# Patient Record
Sex: Female | Born: 1955 | Race: White | Hispanic: No | Marital: Married | Smoking: Never smoker
Health system: Southern US, Community
[De-identification: ages and names within clinical notes are randomized; demographics above are authoritative.]

## PROBLEM LIST (undated history)

## (undated) DIAGNOSIS — E785 Hyperlipidemia, unspecified: Secondary | ICD-10-CM

## (undated) DIAGNOSIS — M5090 Cervical disc disorder, unspecified, unspecified cervical region: Secondary | ICD-10-CM

## (undated) DIAGNOSIS — I493 Ventricular premature depolarization: Secondary | ICD-10-CM

## (undated) DIAGNOSIS — H3500 Unspecified background retinopathy: Secondary | ICD-10-CM

## (undated) DIAGNOSIS — M1990 Unspecified osteoarthritis, unspecified site: Secondary | ICD-10-CM

## (undated) DIAGNOSIS — G4733 Obstructive sleep apnea (adult) (pediatric): Secondary | ICD-10-CM

## (undated) DIAGNOSIS — F419 Anxiety disorder, unspecified: Secondary | ICD-10-CM

## (undated) DIAGNOSIS — E038 Other specified hypothyroidism: Secondary | ICD-10-CM

## (undated) DIAGNOSIS — S82899A Other fracture of unspecified lower leg, initial encounter for closed fracture: Secondary | ICD-10-CM

## (undated) DIAGNOSIS — R609 Edema, unspecified: Secondary | ICD-10-CM

## (undated) DIAGNOSIS — E559 Vitamin D deficiency, unspecified: Secondary | ICD-10-CM

## (undated) DIAGNOSIS — E039 Hypothyroidism, unspecified: Secondary | ICD-10-CM

## (undated) DIAGNOSIS — K219 Gastro-esophageal reflux disease without esophagitis: Secondary | ICD-10-CM

## (undated) DIAGNOSIS — I10 Essential (primary) hypertension: Secondary | ICD-10-CM

## (undated) HISTORY — DX: Unspecified background retinopathy: H35.00

## (undated) HISTORY — DX: Edema, unspecified: R60.9

## (undated) HISTORY — PX: ORIF ANKLE FRACTURE: SUR919

## (undated) HISTORY — DX: Ventricular premature depolarization: I49.3

## (undated) HISTORY — DX: Gastro-esophageal reflux disease without esophagitis: K21.9

## (undated) HISTORY — DX: Essential (primary) hypertension: I10

## (undated) HISTORY — DX: Unspecified osteoarthritis, unspecified site: M19.90

## (undated) HISTORY — PX: FOOT SURGERY: SHX648

## (undated) HISTORY — DX: Anxiety disorder, unspecified: F41.9

## (undated) HISTORY — DX: Other specified hypothyroidism: E03.8

## (undated) HISTORY — DX: Other fracture of unspecified lower leg, initial encounter for closed fracture: S82.899A

## (undated) HISTORY — DX: Hyperlipidemia, unspecified: E78.5

## (undated) HISTORY — DX: Cervical disc disorder, unspecified, unspecified cervical region: M50.90

## (undated) HISTORY — PX: ABDOMINAL HYSTERECTOMY: SHX81

## (undated) HISTORY — DX: Vitamin D deficiency, unspecified: E55.9

## (undated) HISTORY — DX: Hypothyroidism, unspecified: E03.9

## (undated) HISTORY — DX: Obstructive sleep apnea (adult) (pediatric): G47.33

---
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
## 1984-12-02 HISTORY — PX: TUBAL LIGATION: SHX77

## 1999-01-12 ENCOUNTER — Ambulatory Visit (HOSPITAL_COMMUNITY): Admission: RE | Admit: 1999-01-12 | Discharge: 1999-01-12 | Payer: Self-pay | Admitting: Family Medicine

## 1999-01-12 ENCOUNTER — Encounter: Payer: Self-pay | Admitting: Family Medicine

## 1999-04-27 ENCOUNTER — Encounter: Payer: Self-pay | Admitting: Neurosurgery

## 1999-04-27 ENCOUNTER — Ambulatory Visit (HOSPITAL_COMMUNITY): Admission: RE | Admit: 1999-04-27 | Discharge: 1999-04-27 | Payer: Self-pay | Admitting: Neurosurgery

## 1999-06-04 ENCOUNTER — Encounter: Payer: Self-pay | Admitting: Family Medicine

## 1999-06-04 ENCOUNTER — Inpatient Hospital Stay (HOSPITAL_COMMUNITY): Admission: EM | Admit: 1999-06-04 | Discharge: 1999-06-09 | Payer: Self-pay | Admitting: Family Medicine

## 1999-06-04 ENCOUNTER — Encounter: Payer: Self-pay | Admitting: Internal Medicine

## 1999-06-06 ENCOUNTER — Encounter: Payer: Self-pay | Admitting: Gastroenterology

## 1999-06-07 ENCOUNTER — Encounter: Payer: Self-pay | Admitting: Family Medicine

## 1999-06-07 ENCOUNTER — Encounter: Payer: Self-pay | Admitting: Internal Medicine

## 1999-06-08 ENCOUNTER — Encounter: Payer: Self-pay | Admitting: Family Medicine

## 1999-06-25 ENCOUNTER — Other Ambulatory Visit: Admission: RE | Admit: 1999-06-25 | Discharge: 1999-06-25 | Payer: Self-pay | Admitting: Obstetrics and Gynecology

## 2000-01-01 ENCOUNTER — Encounter: Payer: Self-pay | Admitting: Gastroenterology

## 2000-01-01 ENCOUNTER — Ambulatory Visit (HOSPITAL_COMMUNITY): Admission: RE | Admit: 2000-01-01 | Discharge: 2000-01-01 | Payer: Self-pay | Admitting: Gastroenterology

## 2001-03-18 ENCOUNTER — Other Ambulatory Visit: Admission: RE | Admit: 2001-03-18 | Discharge: 2001-03-18 | Payer: Self-pay | Admitting: Obstetrics and Gynecology

## 2003-02-21 ENCOUNTER — Other Ambulatory Visit: Admission: RE | Admit: 2003-02-21 | Discharge: 2003-02-21 | Payer: Self-pay | Admitting: *Deleted

## 2005-09-26 ENCOUNTER — Other Ambulatory Visit: Admission: RE | Admit: 2005-09-26 | Discharge: 2005-09-26 | Payer: Self-pay | Admitting: Obstetrics and Gynecology

## 2005-10-03 ENCOUNTER — Emergency Department (HOSPITAL_COMMUNITY): Admission: EM | Admit: 2005-10-03 | Discharge: 2005-10-03 | Payer: Self-pay | Admitting: Emergency Medicine

## 2005-10-03 IMAGING — CR DG ANKLE 2V *L*
2 series · 2 of 2 positions shown · non-contrast
Comparison: None.

CLINICAL DATA: Fall.  
 2-VIEW LEFT ANKLE:

[view not recorded (1 of 2)]
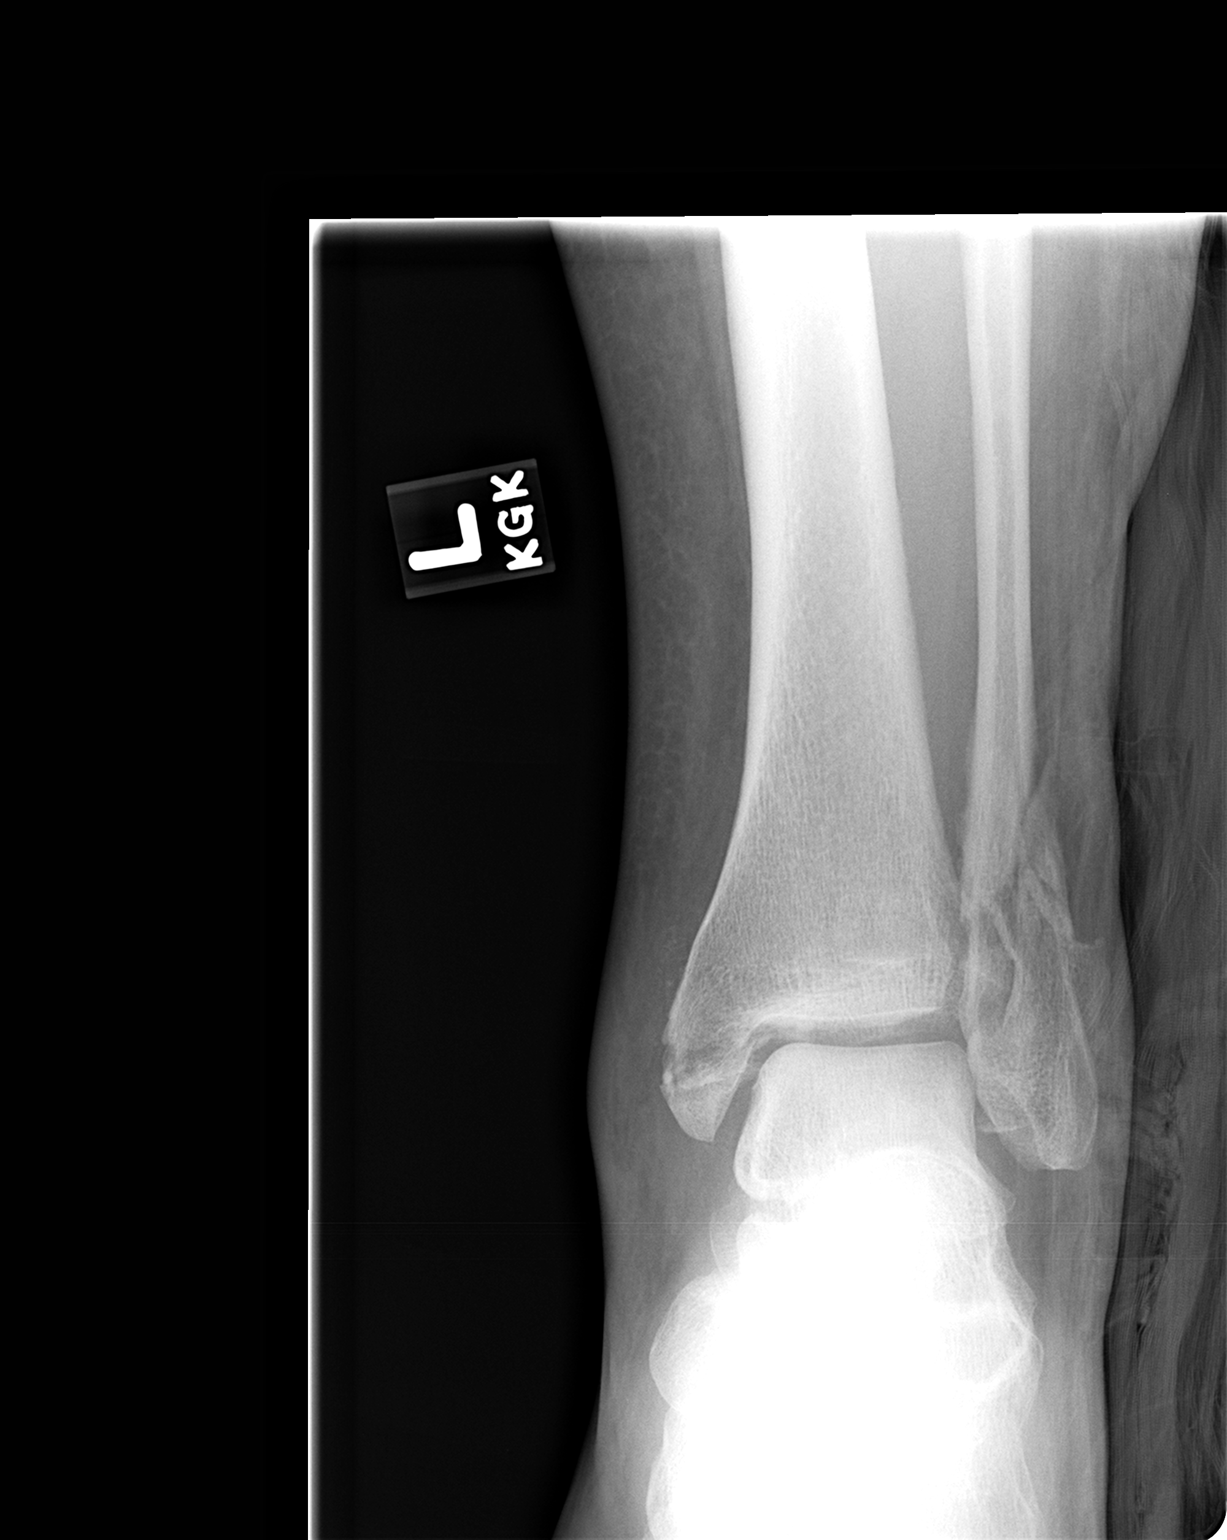

[view not recorded (2 of 2)]
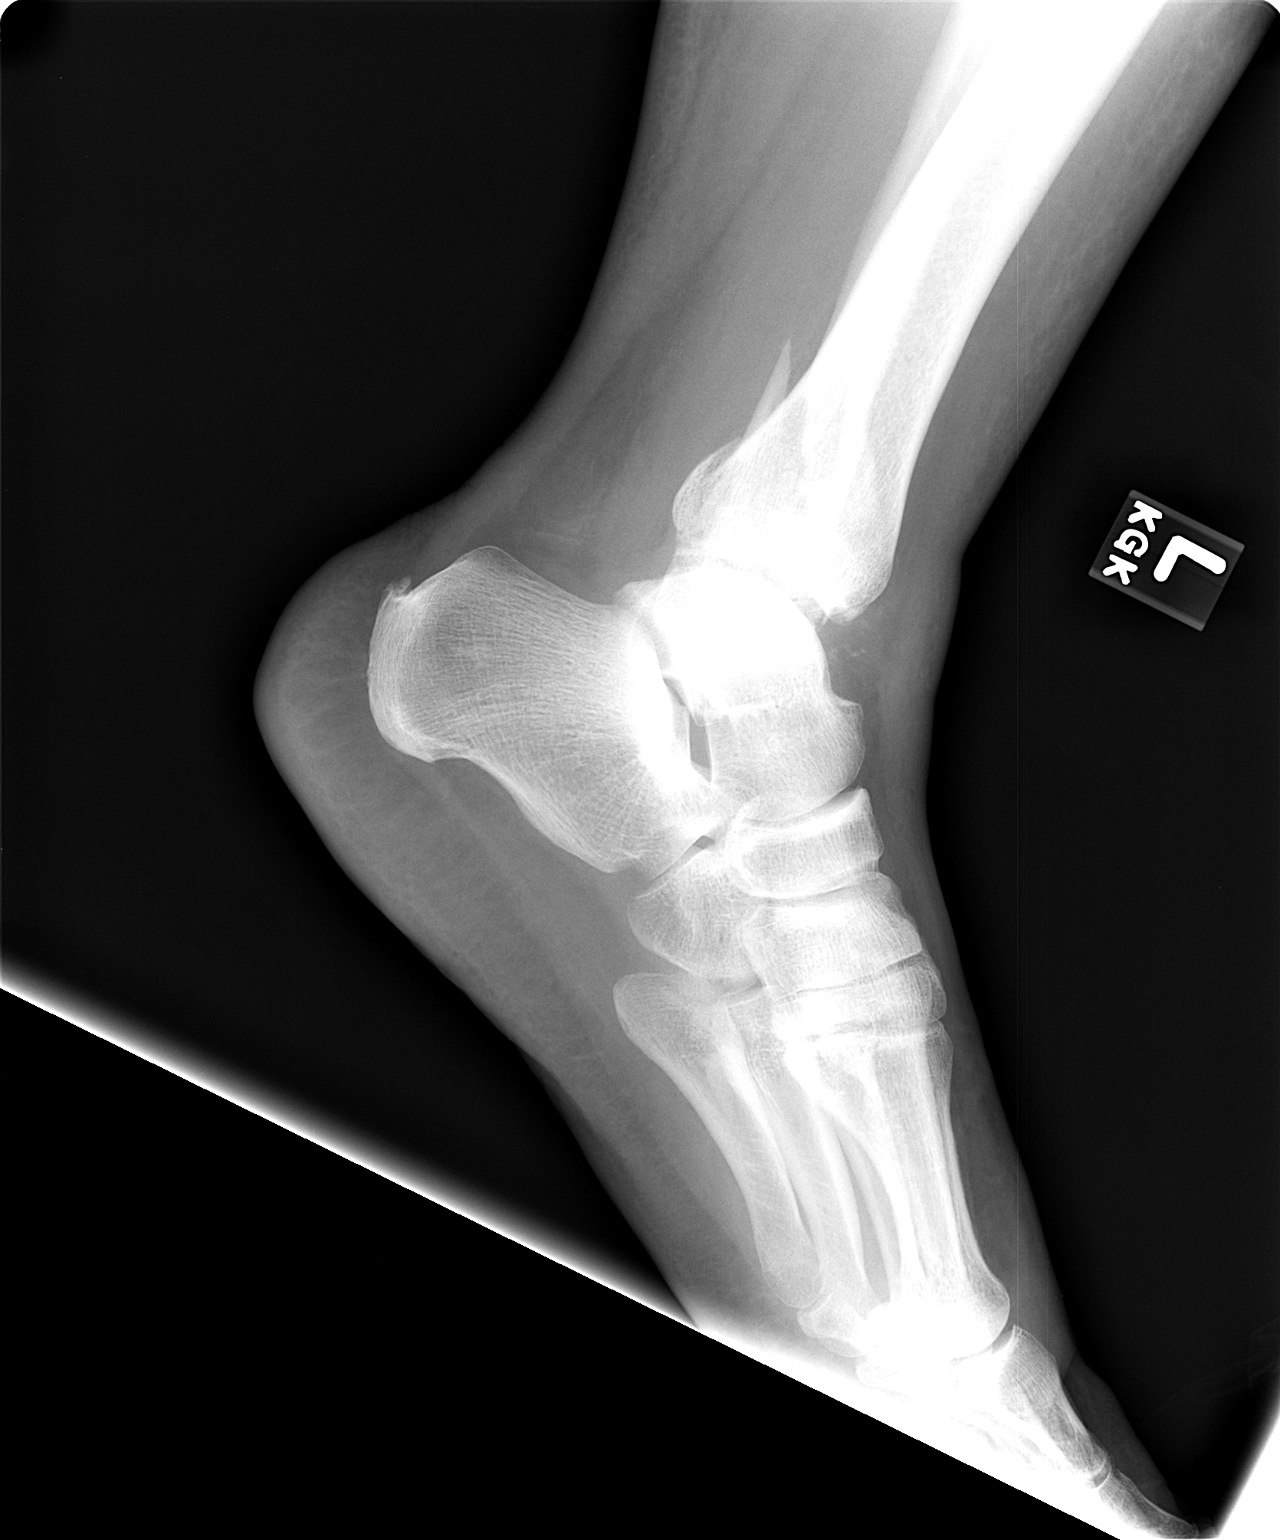

[2 of 2 positions shown; findings below may reference images not displayed]

FINDINGS: There is a comminuted fracture of the distal left fibula, minimally displaced fracture of the medial malleoli and posterior displaced fracture of the posterior malleoli.
IMPRESSION: Findings consistent with a trimalleolar NAMBAR OAN injury.

## 2005-10-03 IMAGING — CR DG CHEST 1V PORT
1 series · 1 of 1 positions shown · non-contrast
Comparison: None.

CLINICAL DATA: Preop for ankle injury.  
 PORTABLE CHEST - 1 VIEW [DATE]:

[view not recorded]
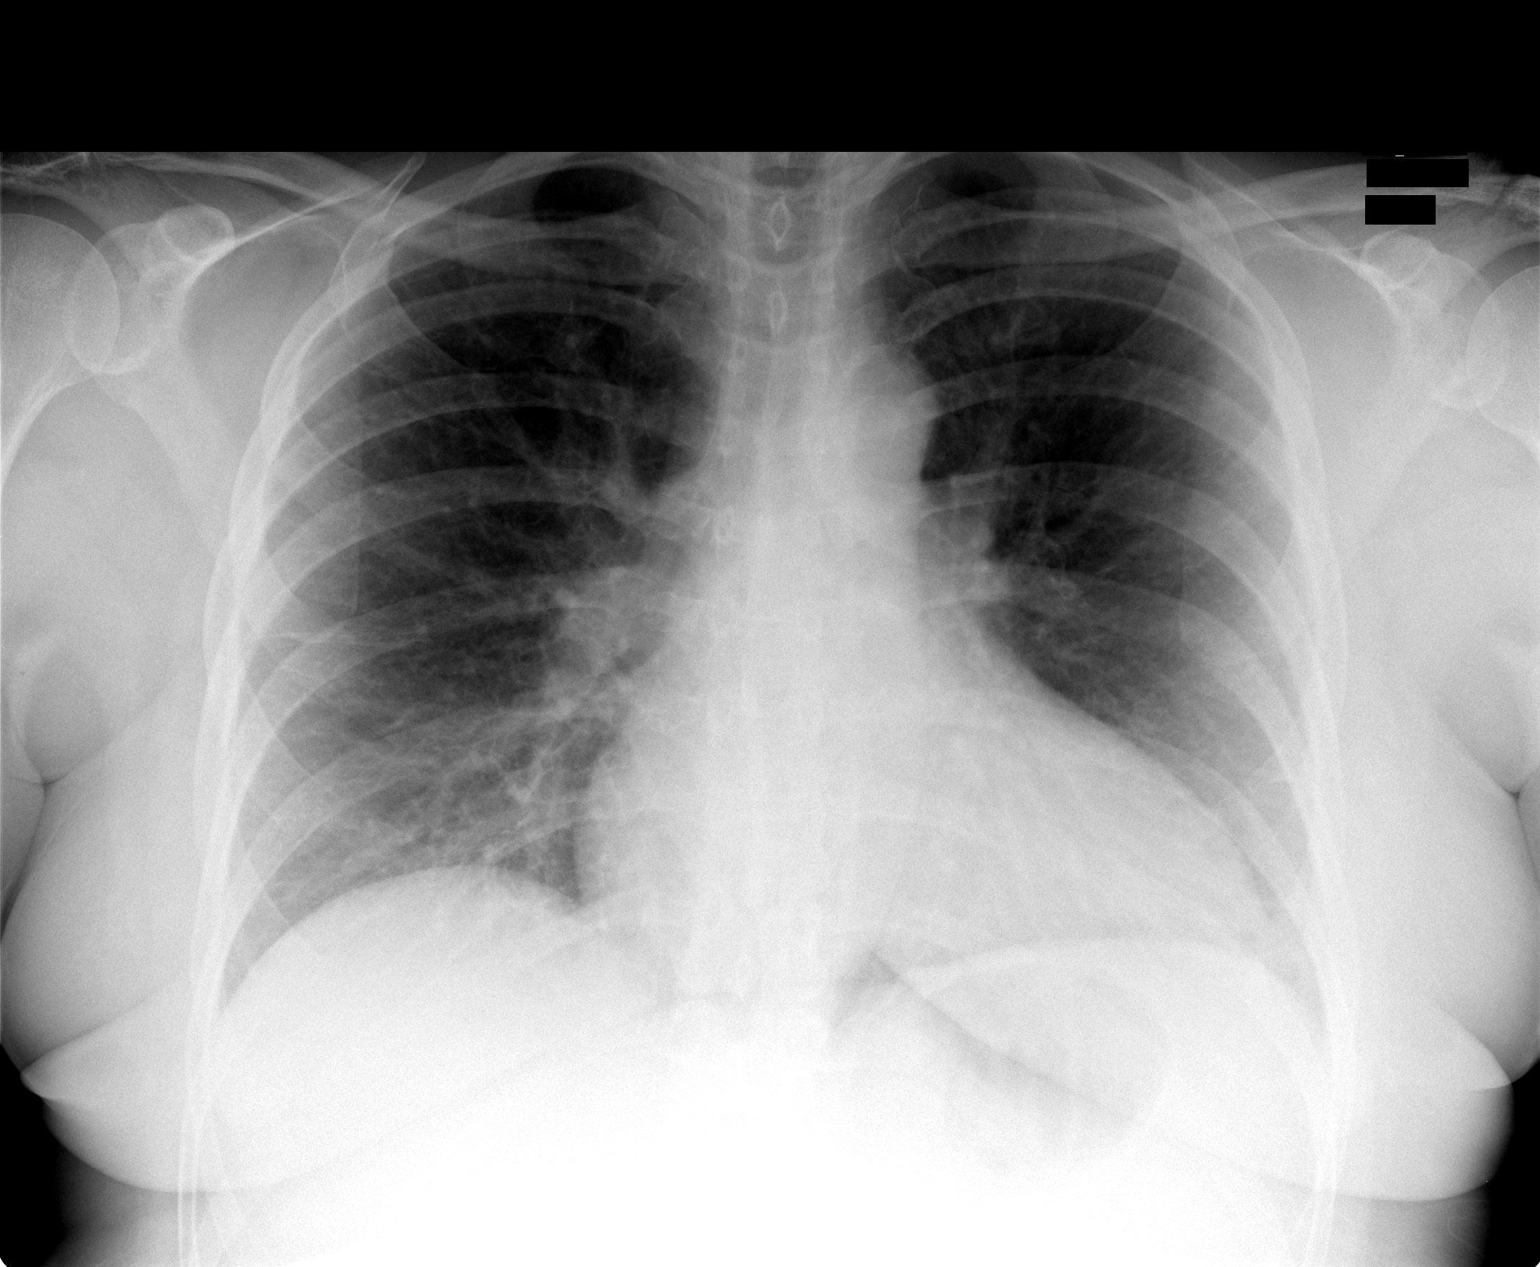

[1 of 1 positions shown; findings below may reference images not displayed]

FINDINGS: There is cardiomegaly.  
 Bibasilar atelectasis is noted.  There are no effusions and no pulmonary edema.
IMPRESSION: Prominent bibasilar atelectasis.

## 2005-10-08 IMAGING — CR DG ANKLE 2V *L*
2 series · 2 of 2 positions shown · non-contrast
Comparison: none

CLINICAL DATA: Post-op left ankle fracture.
 LEFT ANKLE ? 2 VIEWS:

[view not recorded (1 of 2)]
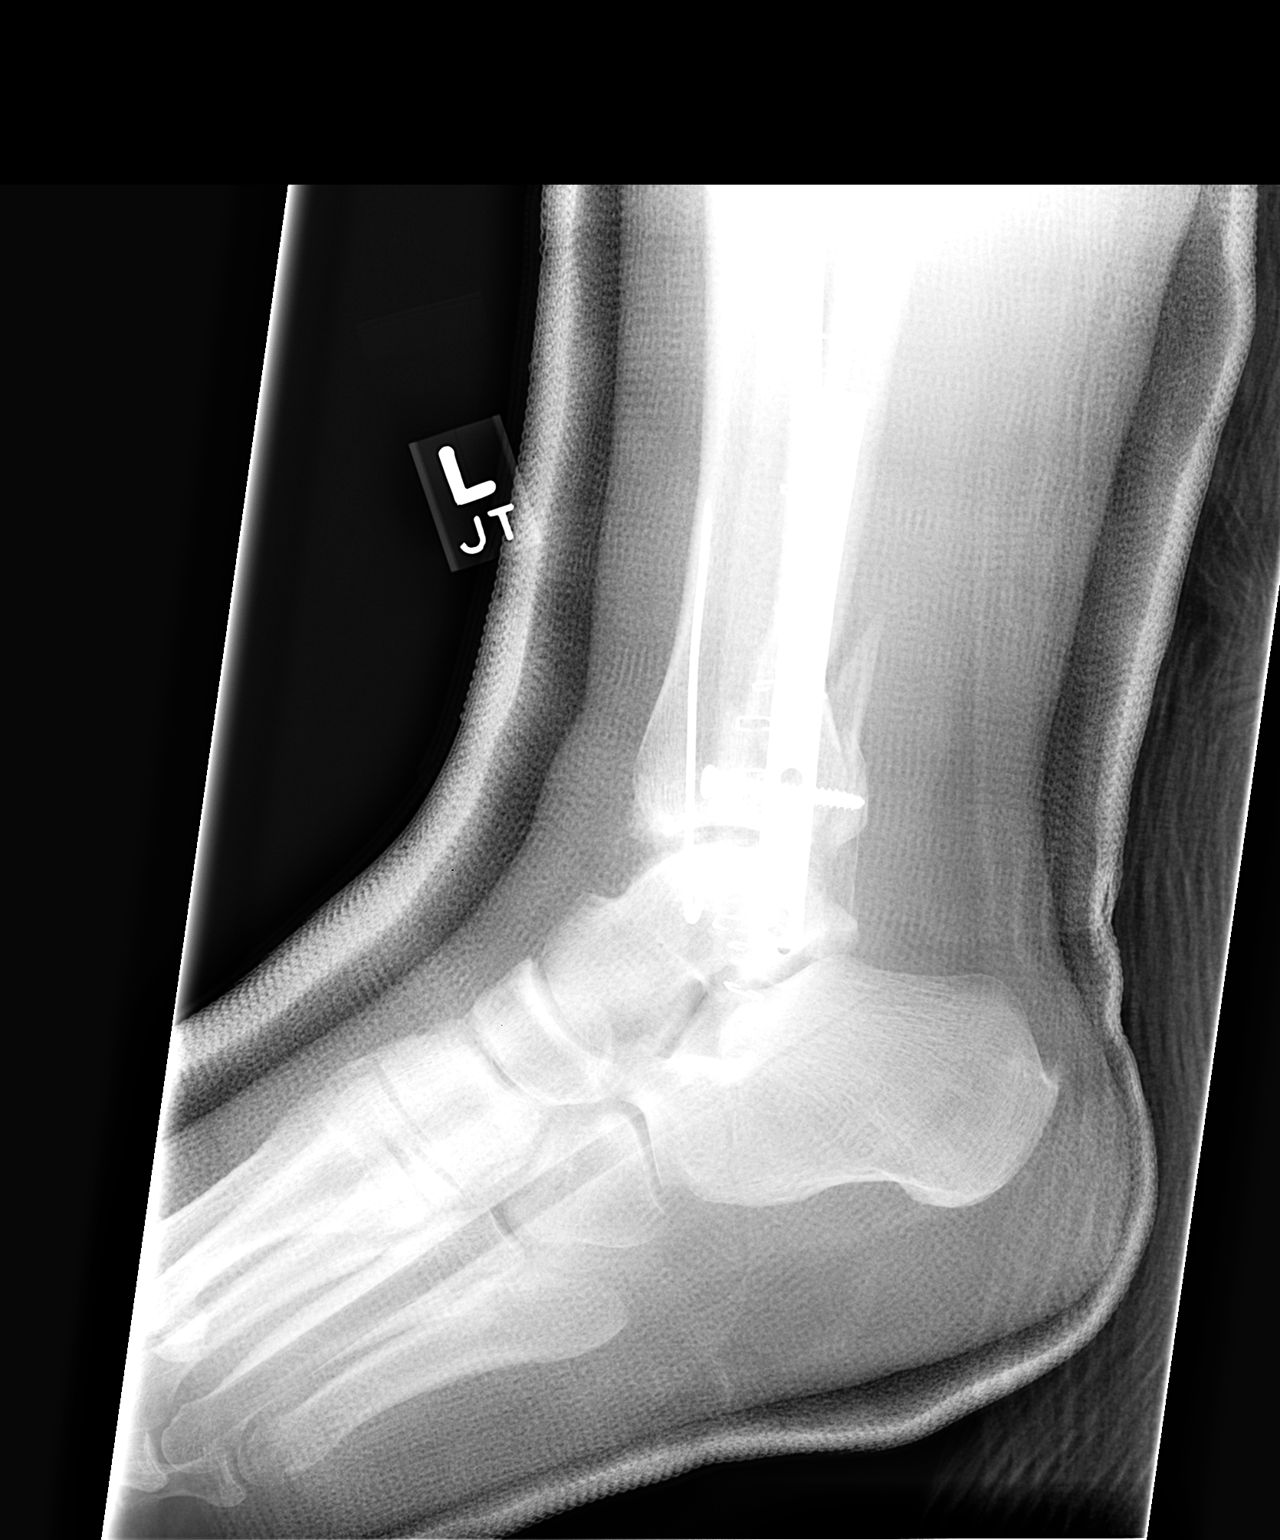

[view not recorded (2 of 2)]
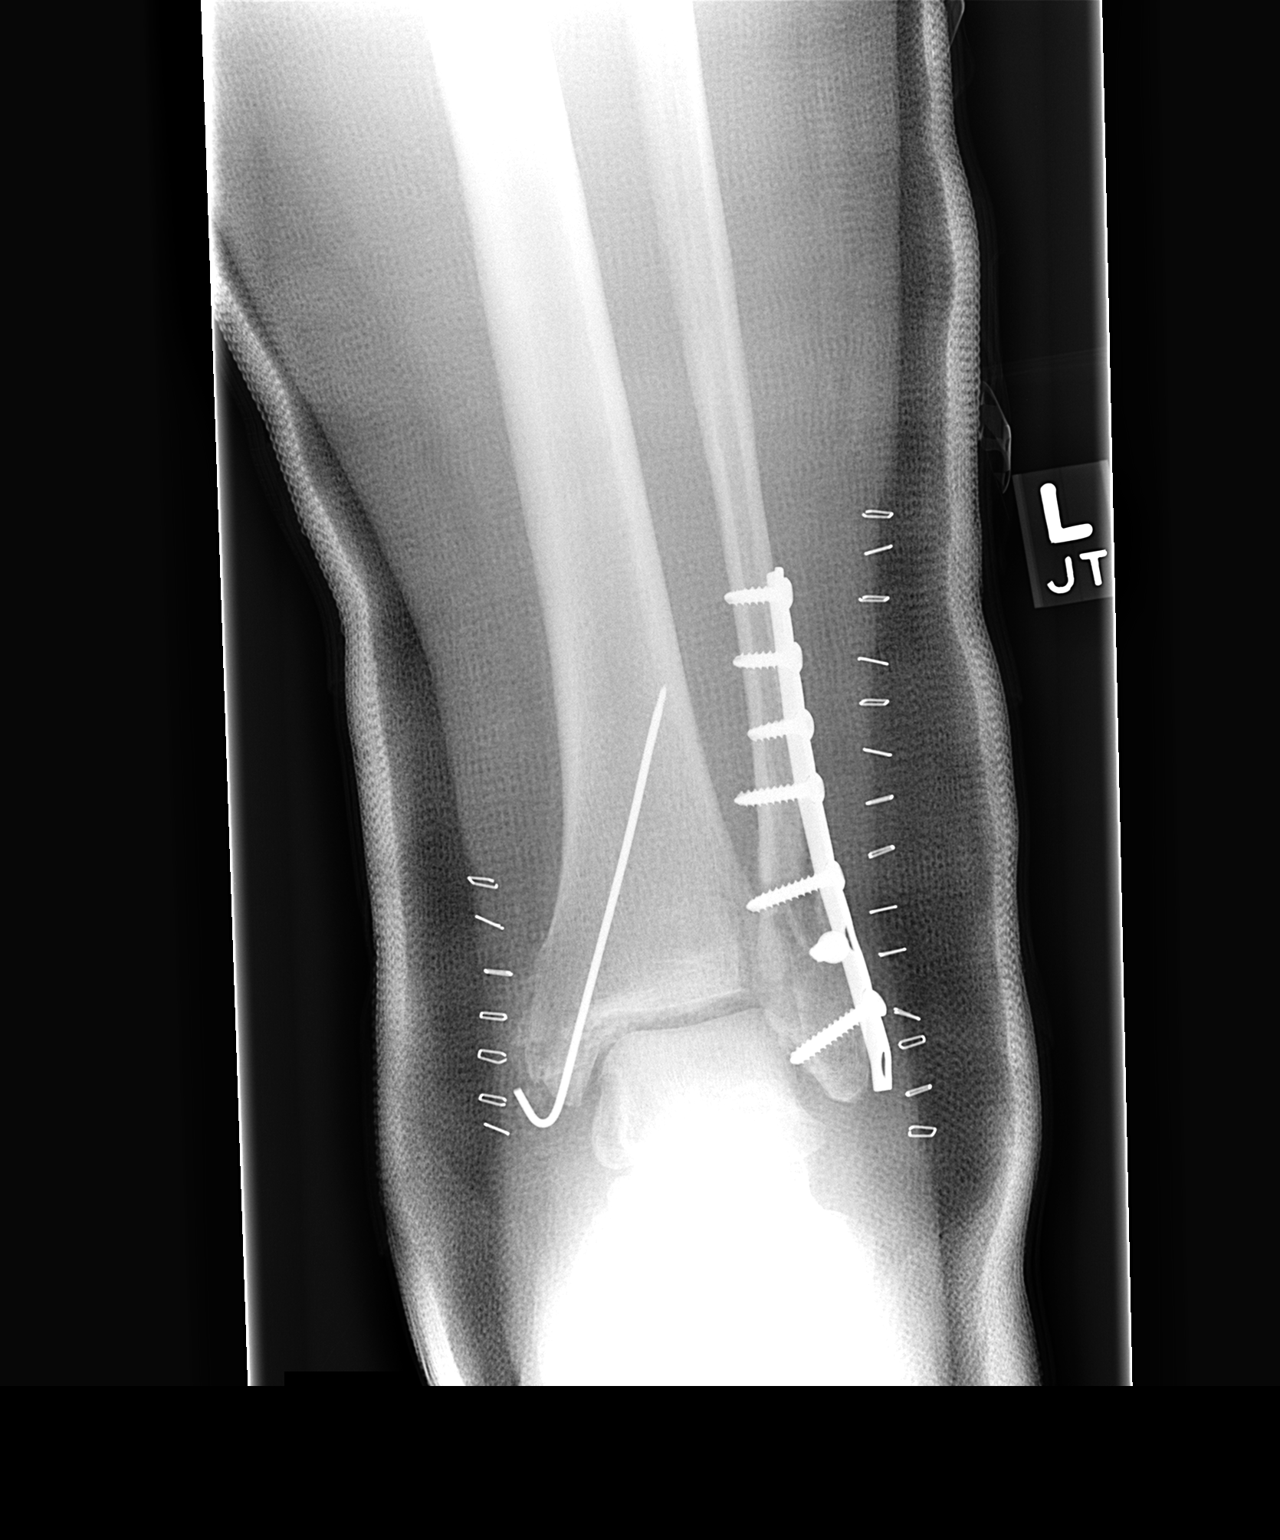

[2 of 2 positions shown; findings below may reference images not displayed]

FINDINGS: Portable AP and lateral views of the left ankle reveal satisfactory position of the plate of the distal fibula and a pin traversing the medial malleolus.
IMPRESSION: Satisfactory open reduction and internal fixation of a trimalleolar fracture.

## 2005-10-09 ENCOUNTER — Ambulatory Visit: Payer: Self-pay | Admitting: Internal Medicine

## 2005-10-09 ENCOUNTER — Encounter: Payer: Self-pay | Admitting: Cardiology

## 2005-10-09 ENCOUNTER — Inpatient Hospital Stay (HOSPITAL_COMMUNITY): Admission: RE | Admit: 2005-10-09 | Discharge: 2005-10-11 | Payer: Self-pay | Admitting: Orthopedic Surgery

## 2005-10-09 ENCOUNTER — Ambulatory Visit: Payer: Self-pay | Admitting: Cardiology

## 2005-10-09 IMAGING — CR DG CHEST 2V
2 series · 2 of 2 positions shown · non-contrast
Comparison: [DATE].

CLINICAL DATA: Diabetes, fever. 
 CHEST - 2 VIEW:

[w chest lat]
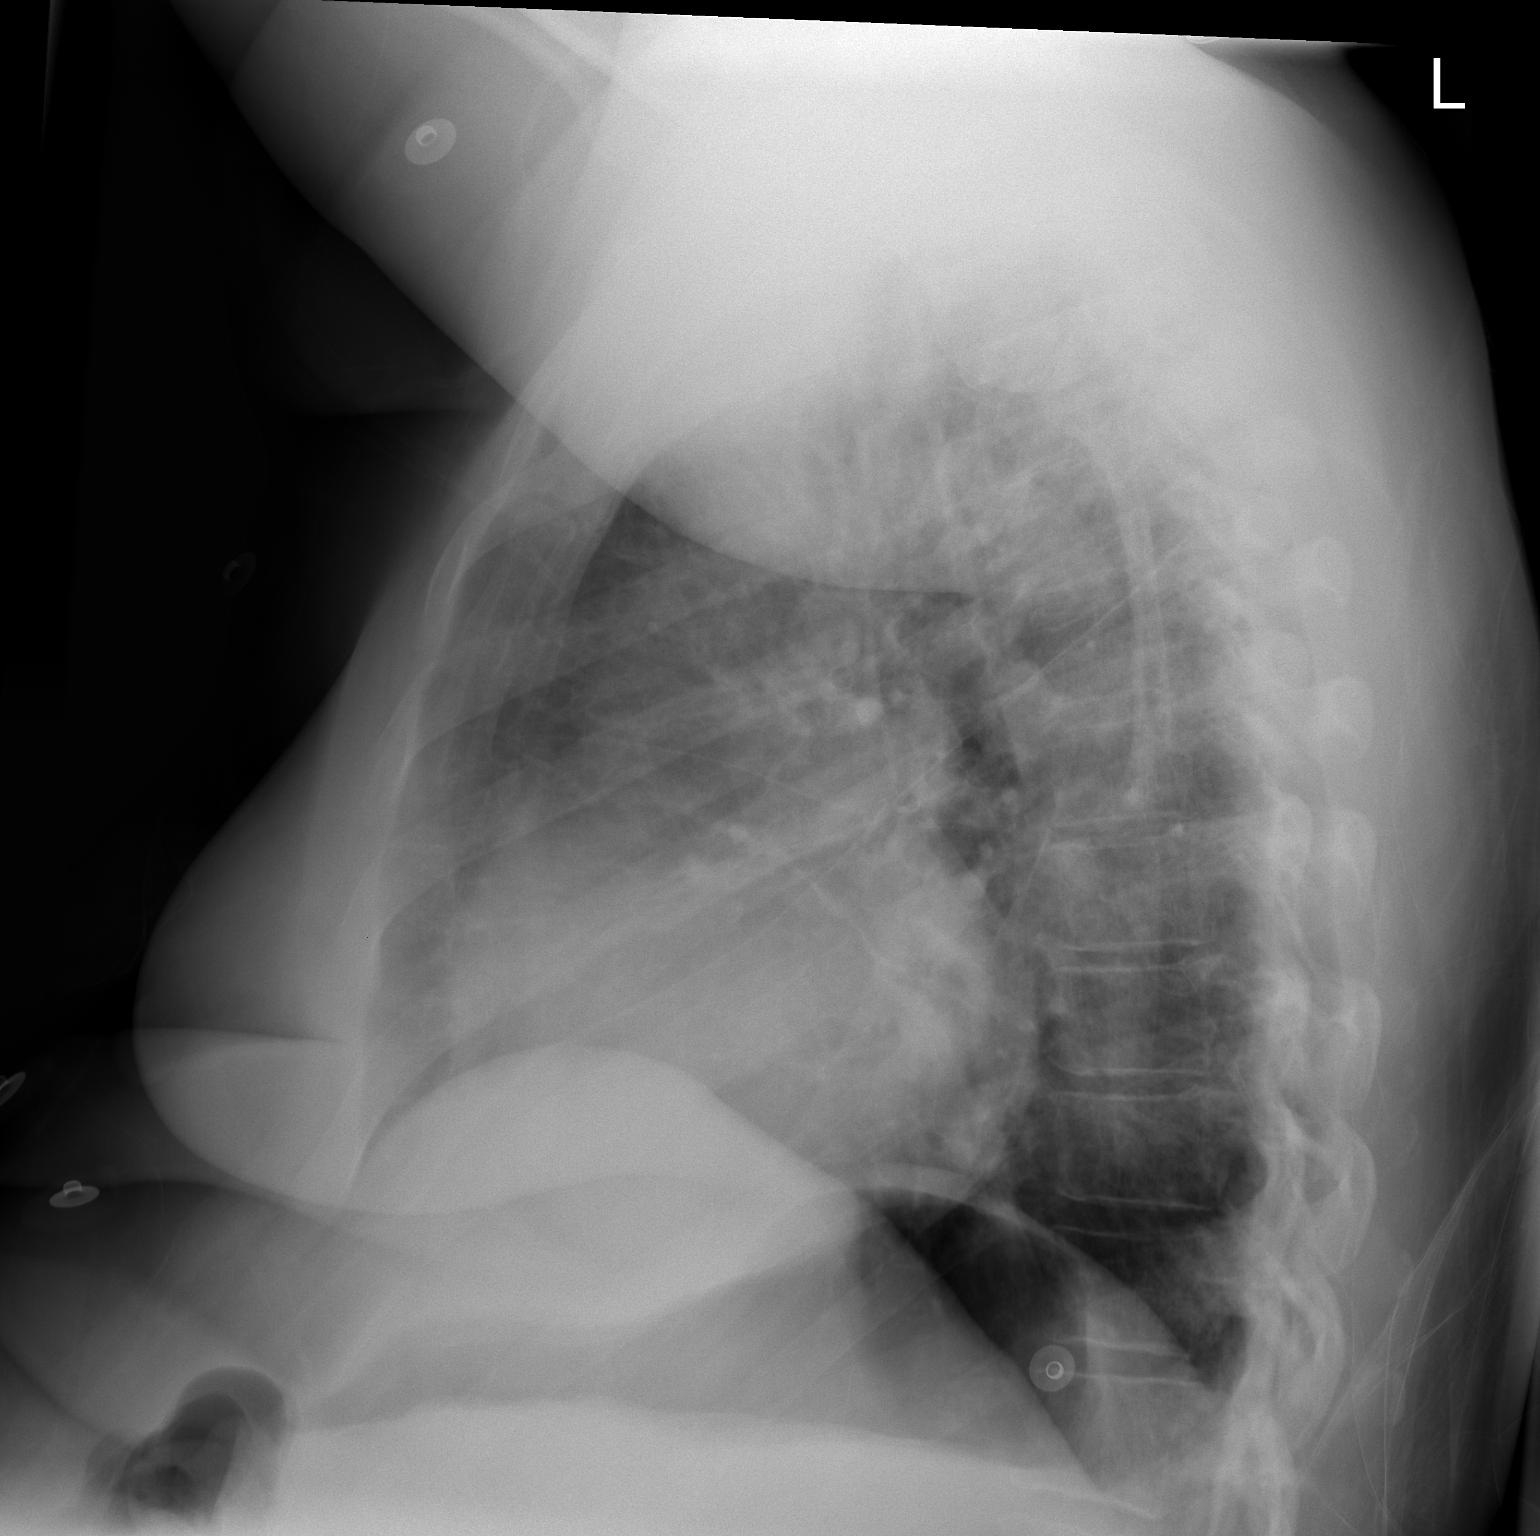

[view not recorded]
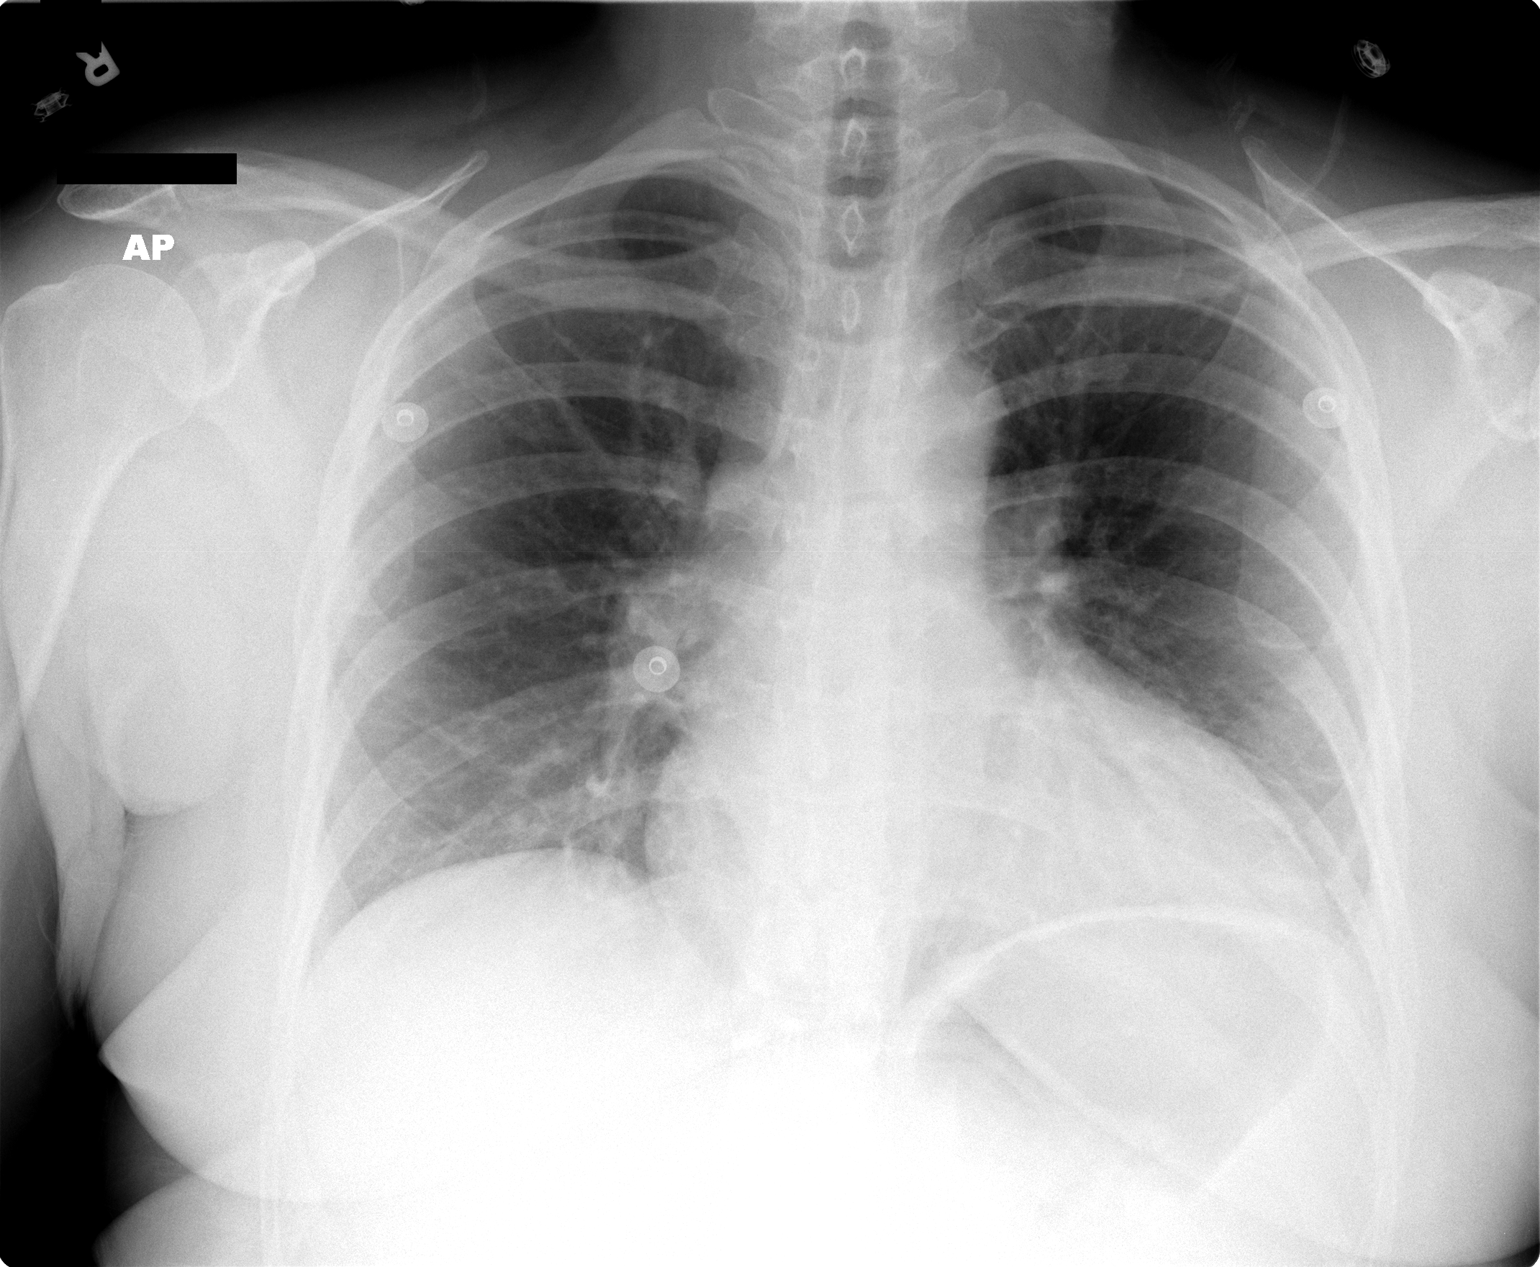

[2 of 2 positions shown; findings below may reference images not displayed]

The heart is mildly enlarged. There is mild bibasilar atelectasis.  No areas of consolidation, lobar collapse or pleural fluid.  Upper lobes are clear.
IMPRESSION: Cardiomegaly and bibasilar atelectasis.  No significant change.

## 2005-12-02 HISTORY — PX: OTHER SURGICAL HISTORY: SHX169

## 2005-12-05 ENCOUNTER — Ambulatory Visit: Payer: Self-pay | Admitting: Internal Medicine

## 2006-03-05 ENCOUNTER — Encounter: Admission: RE | Admit: 2006-03-05 | Discharge: 2006-03-05 | Payer: Self-pay | Admitting: Family Medicine

## 2006-03-05 IMAGING — CR DG CHEST 2V
2 series · 2 of 2 positions shown · non-contrast
Comparison: [DATE]

CLINICAL DATA: Cough, congestion, fever

CHEST - 2 VIEW:

[w chest pa]
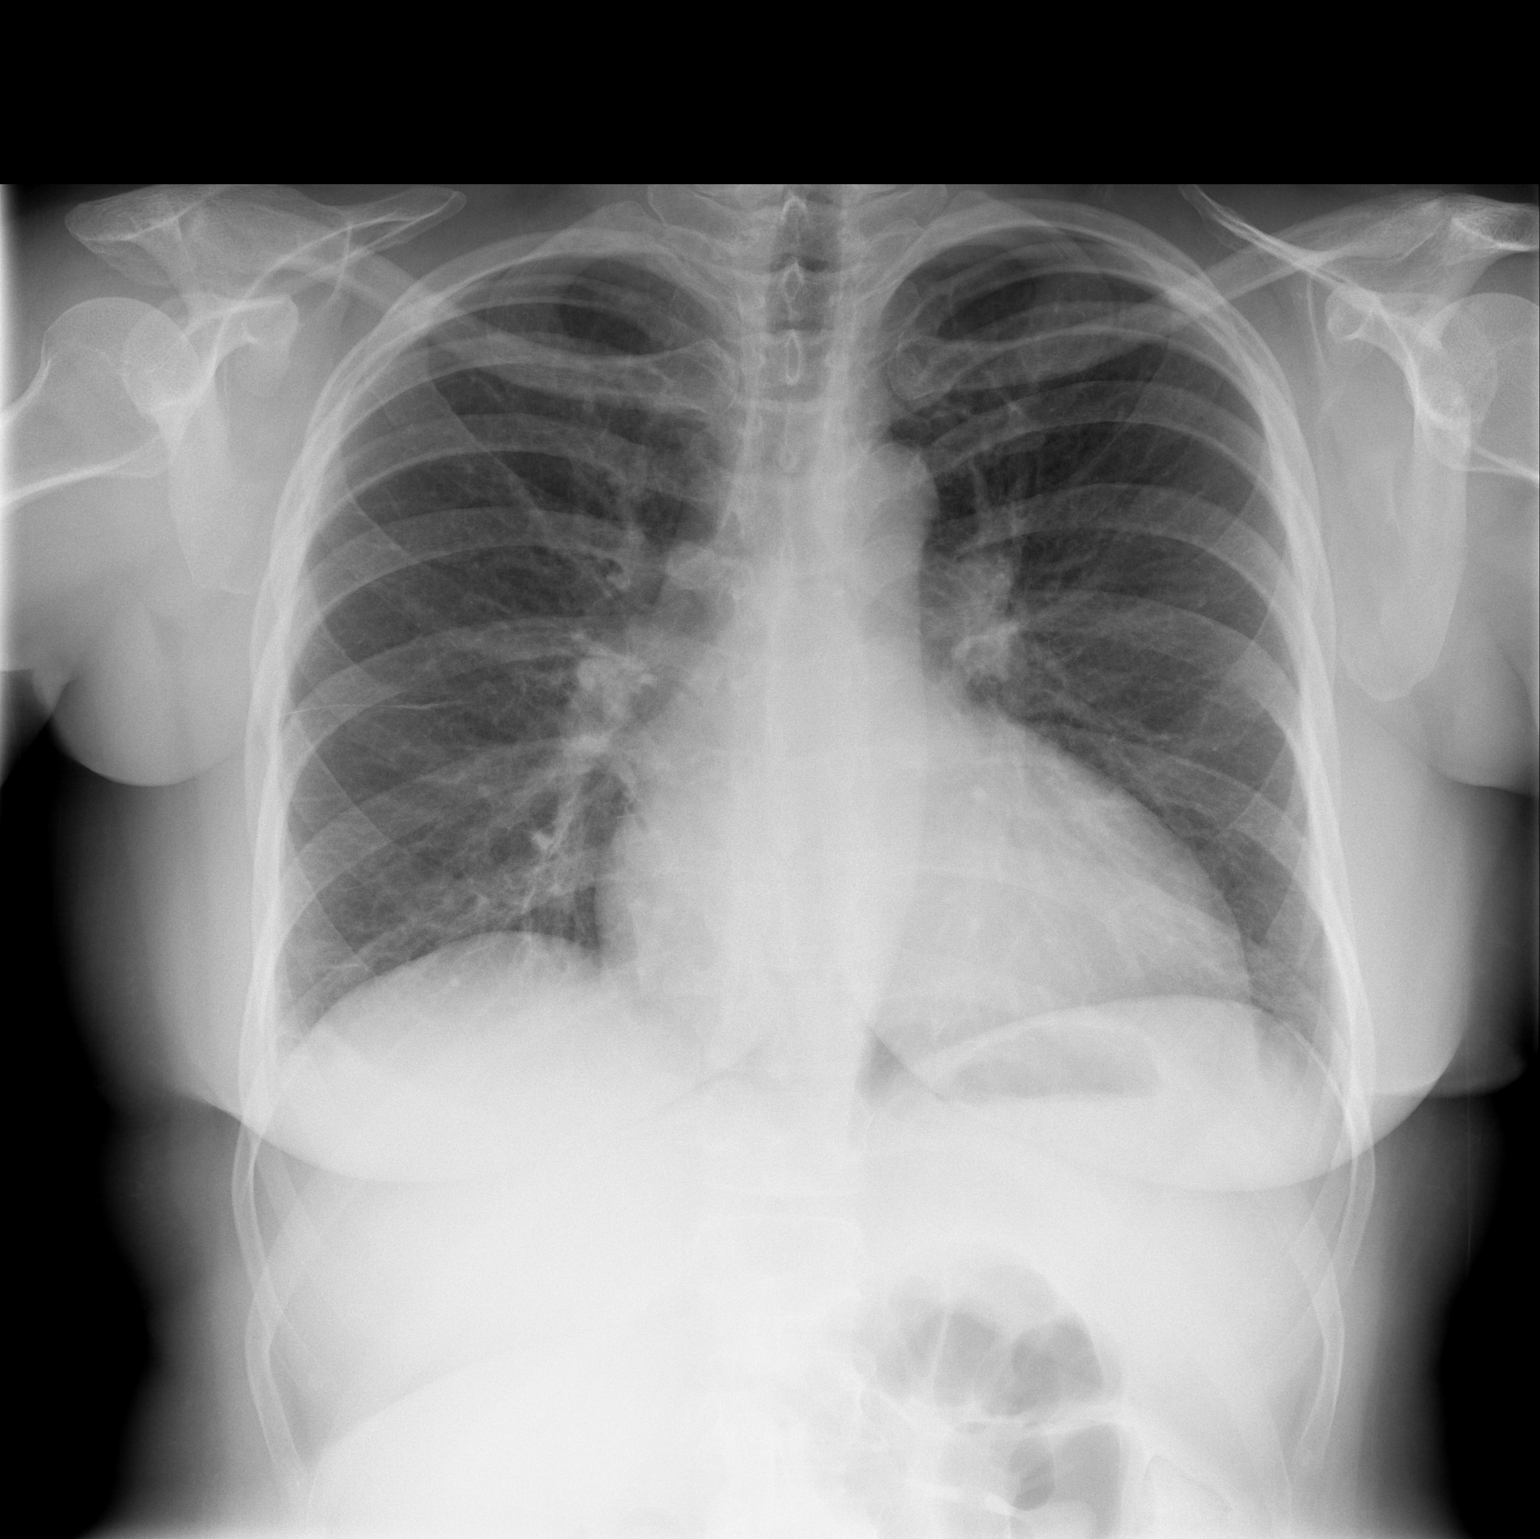

[w chest lat]
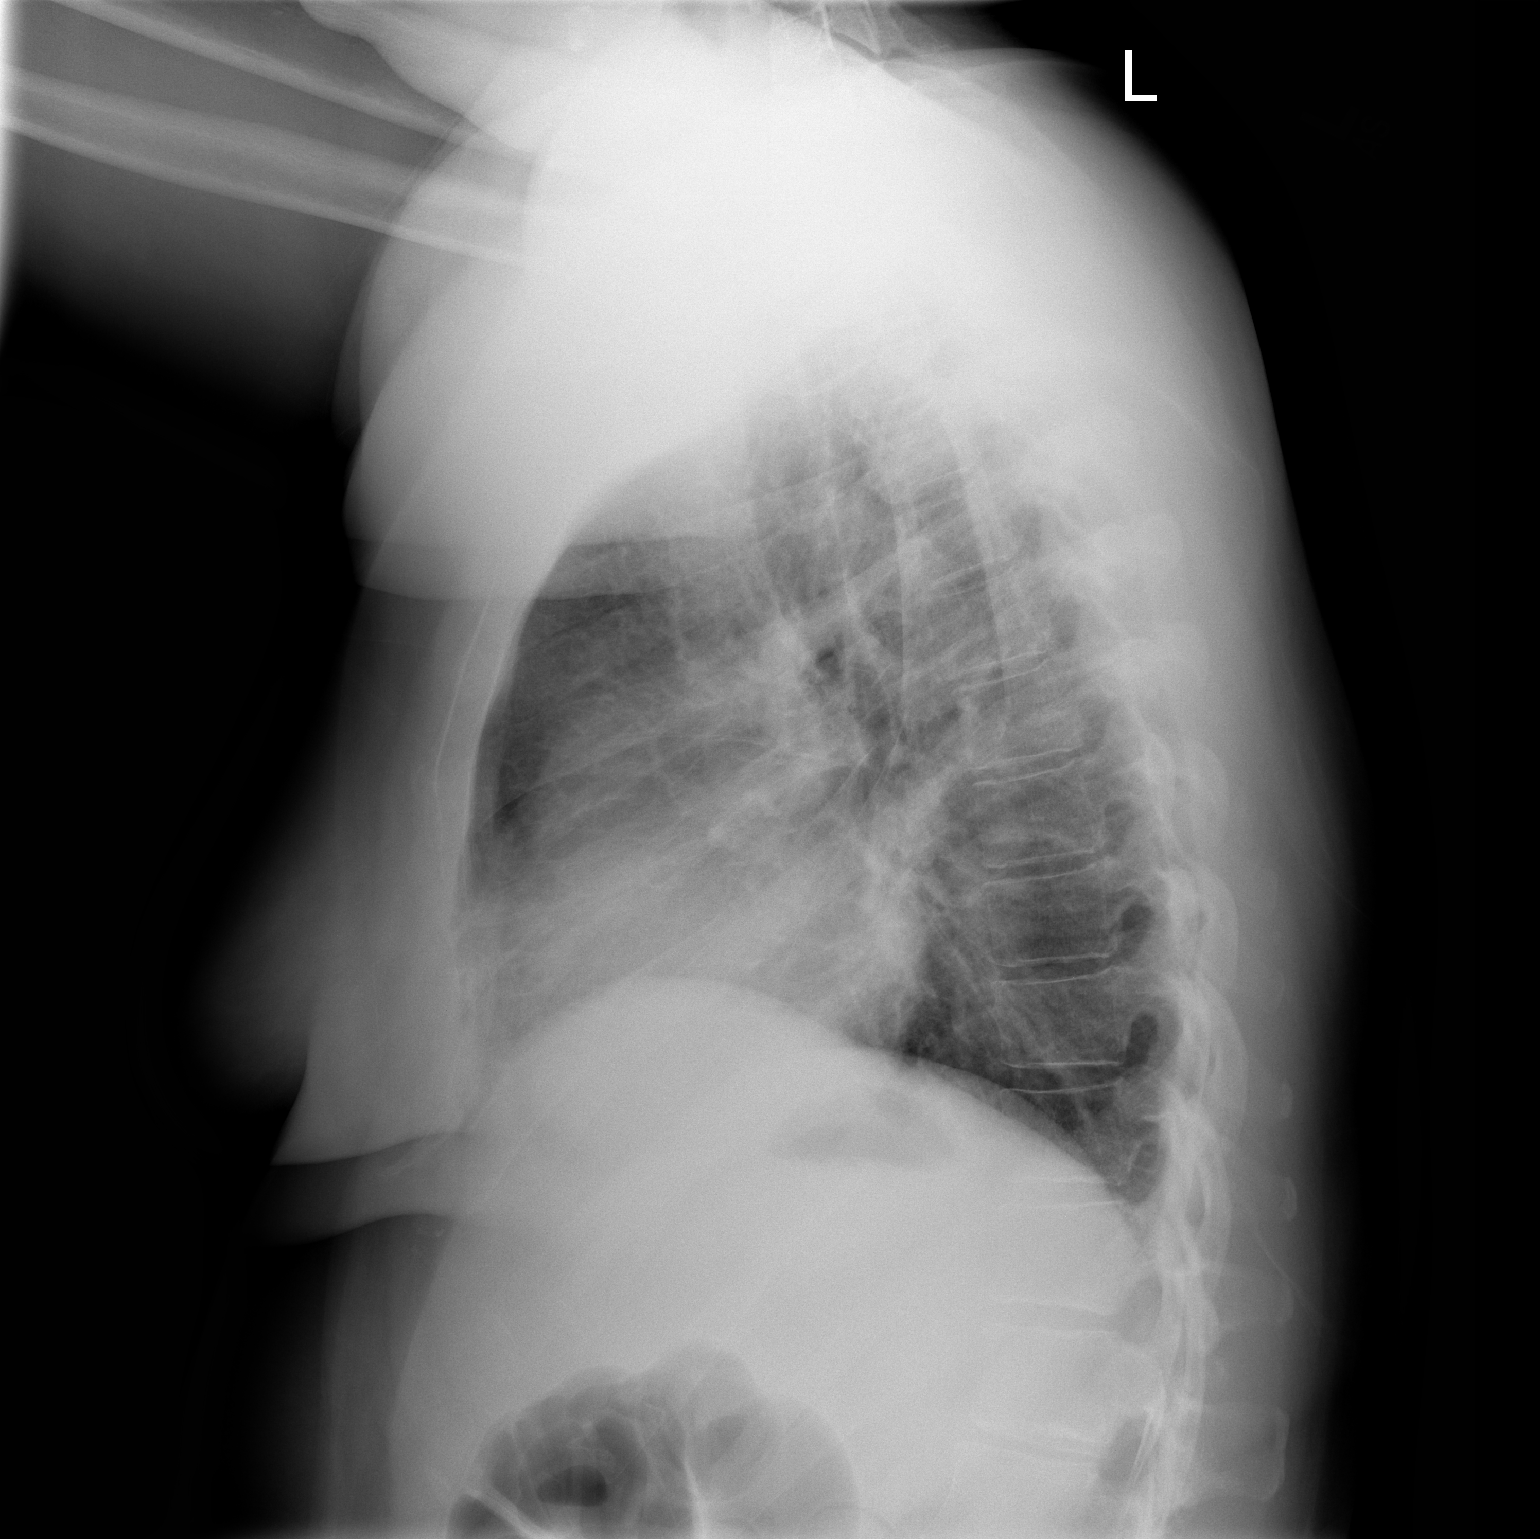

[2 of 2 positions shown; findings below may reference images not displayed]

FINDINGS: Stable cardiomegaly. There is mild peribronchial thickening and
bibasilar atelectasis. No focal airspace opacities or effusions. No change since
prior study.
IMPRESSION: Stable cardiomegaly, bronchitic changes, and bibasilar atelectasis.

## 2006-03-26 ENCOUNTER — Ambulatory Visit: Payer: Self-pay | Admitting: Gastroenterology

## 2006-04-11 ENCOUNTER — Ambulatory Visit: Payer: Self-pay | Admitting: Gastroenterology

## 2006-05-06 ENCOUNTER — Ambulatory Visit: Payer: Self-pay | Admitting: Gastroenterology

## 2006-05-11 ENCOUNTER — Ambulatory Visit: Payer: Self-pay | Admitting: Internal Medicine

## 2006-05-11 ENCOUNTER — Inpatient Hospital Stay (HOSPITAL_COMMUNITY): Admission: EM | Admit: 2006-05-11 | Discharge: 2006-05-17 | Payer: Self-pay | Admitting: Emergency Medicine

## 2006-05-11 IMAGING — CR DG CHEST 2V
2 series · 2 of 2 positions shown · non-contrast
Comparison: none

CLINICAL DATA: Finger injury.  Preoperative evaluation. 
 CHEST ? 2 VIEW:

[view not recorded (1 of 2)]
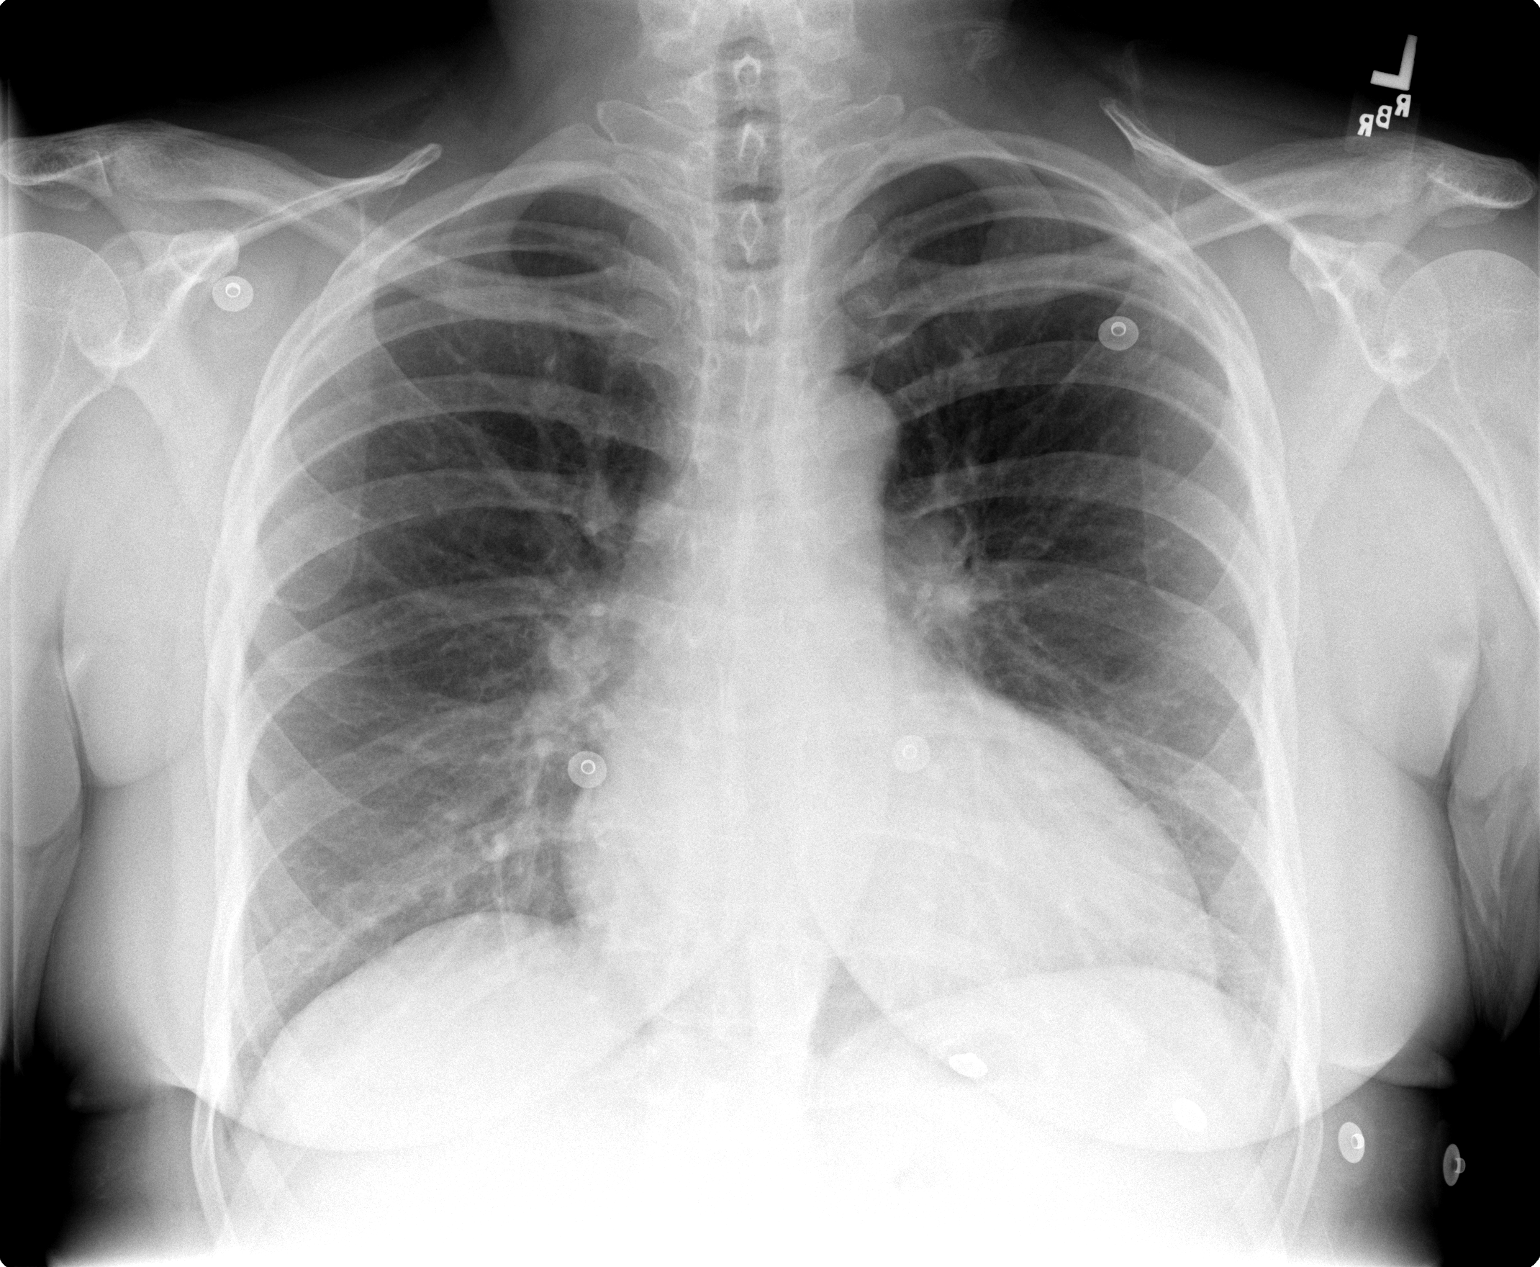

[view not recorded (2 of 2)]
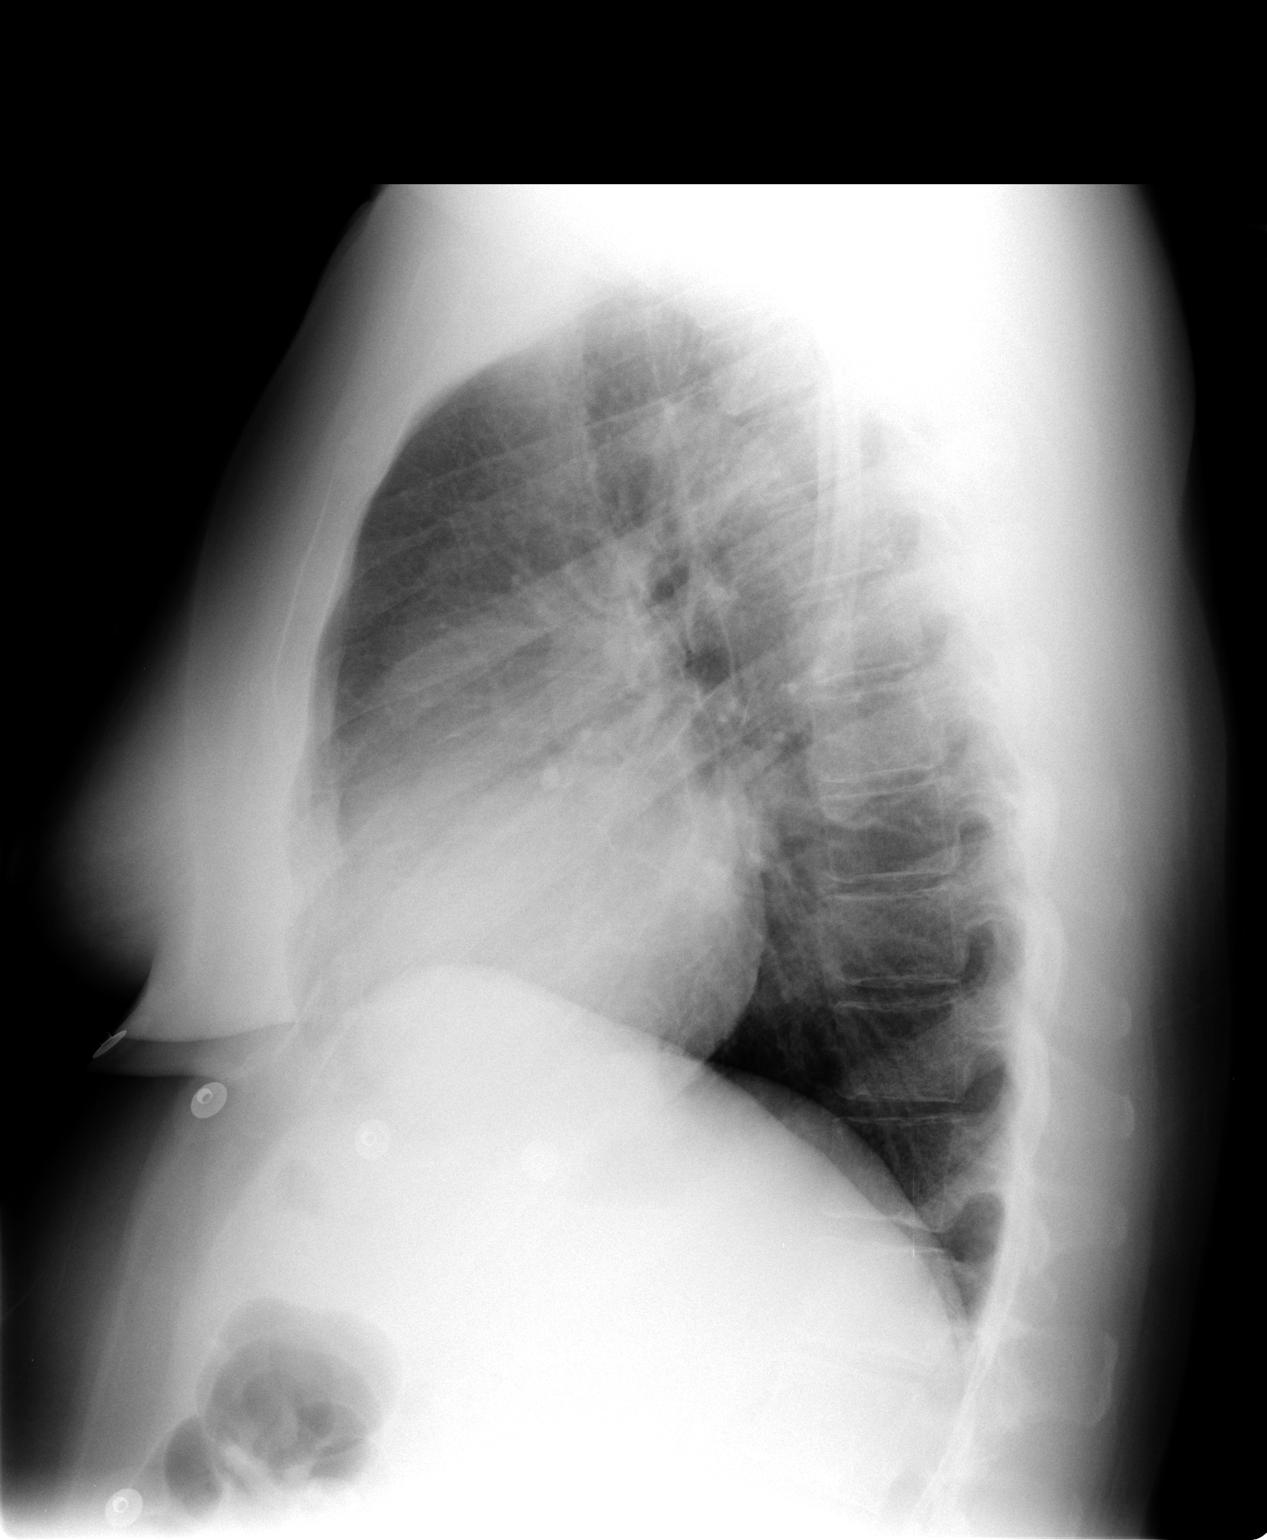

[2 of 2 positions shown; findings below may reference images not displayed]

FINDINGS: The heart is again seen to be enlarged.   The vascularity is normal.   The lungs are clear.   No effusions.   No acute soft tissue or bony findings.
IMPRESSION: Cardiomegaly.   No active disease.
 RIGHT INDEX FINGER ? 3 VIEW:
FINDINGS: The soft tissues are swollen, but there is no evidence of fracture or radiopaque foreign object.   No sign of arthropathy.
IMPRESSION: Soft tissue swelling.

## 2006-05-11 IMAGING — CR DG FINGER INDEX 2+V*R*
3 series · 3 of 3 positions shown · non-contrast
Comparison: none

CLINICAL DATA: Finger injury.  Preoperative evaluation. 
 CHEST ? 2 VIEW:

[view not recorded (1 of 3)]
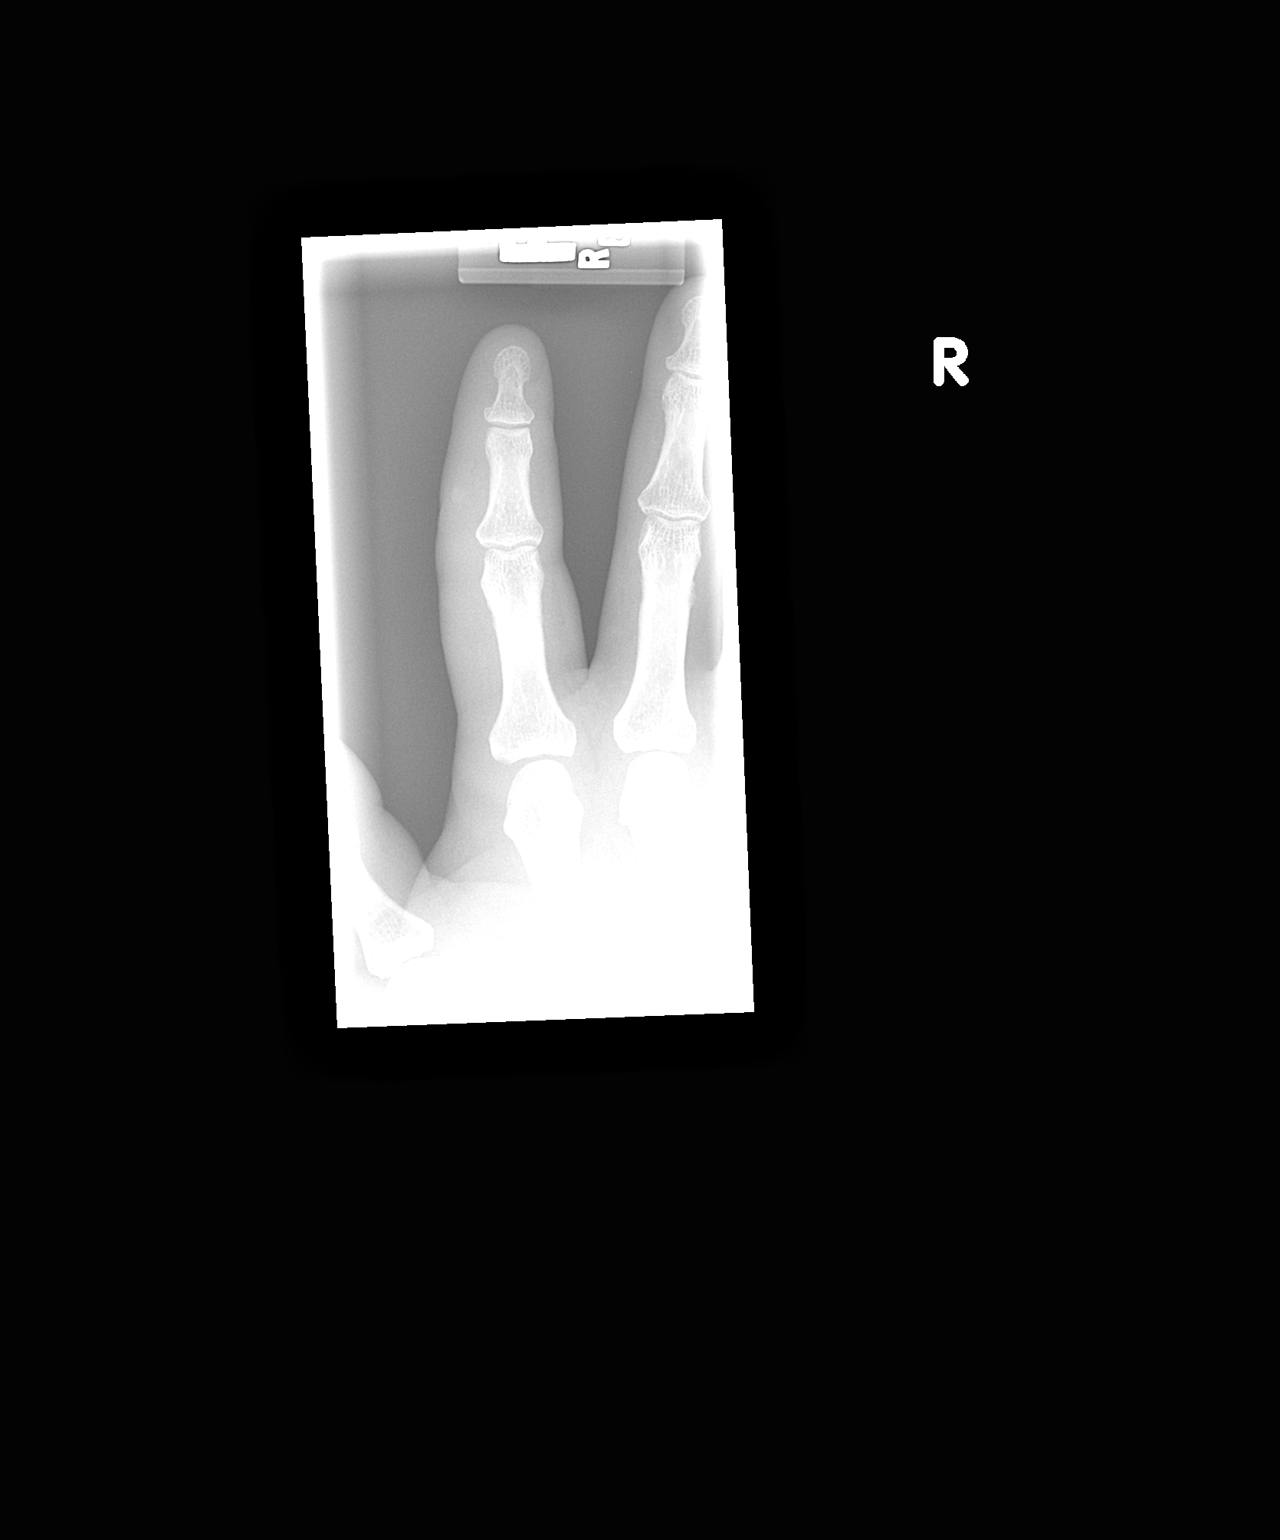

[view not recorded (2 of 3)]
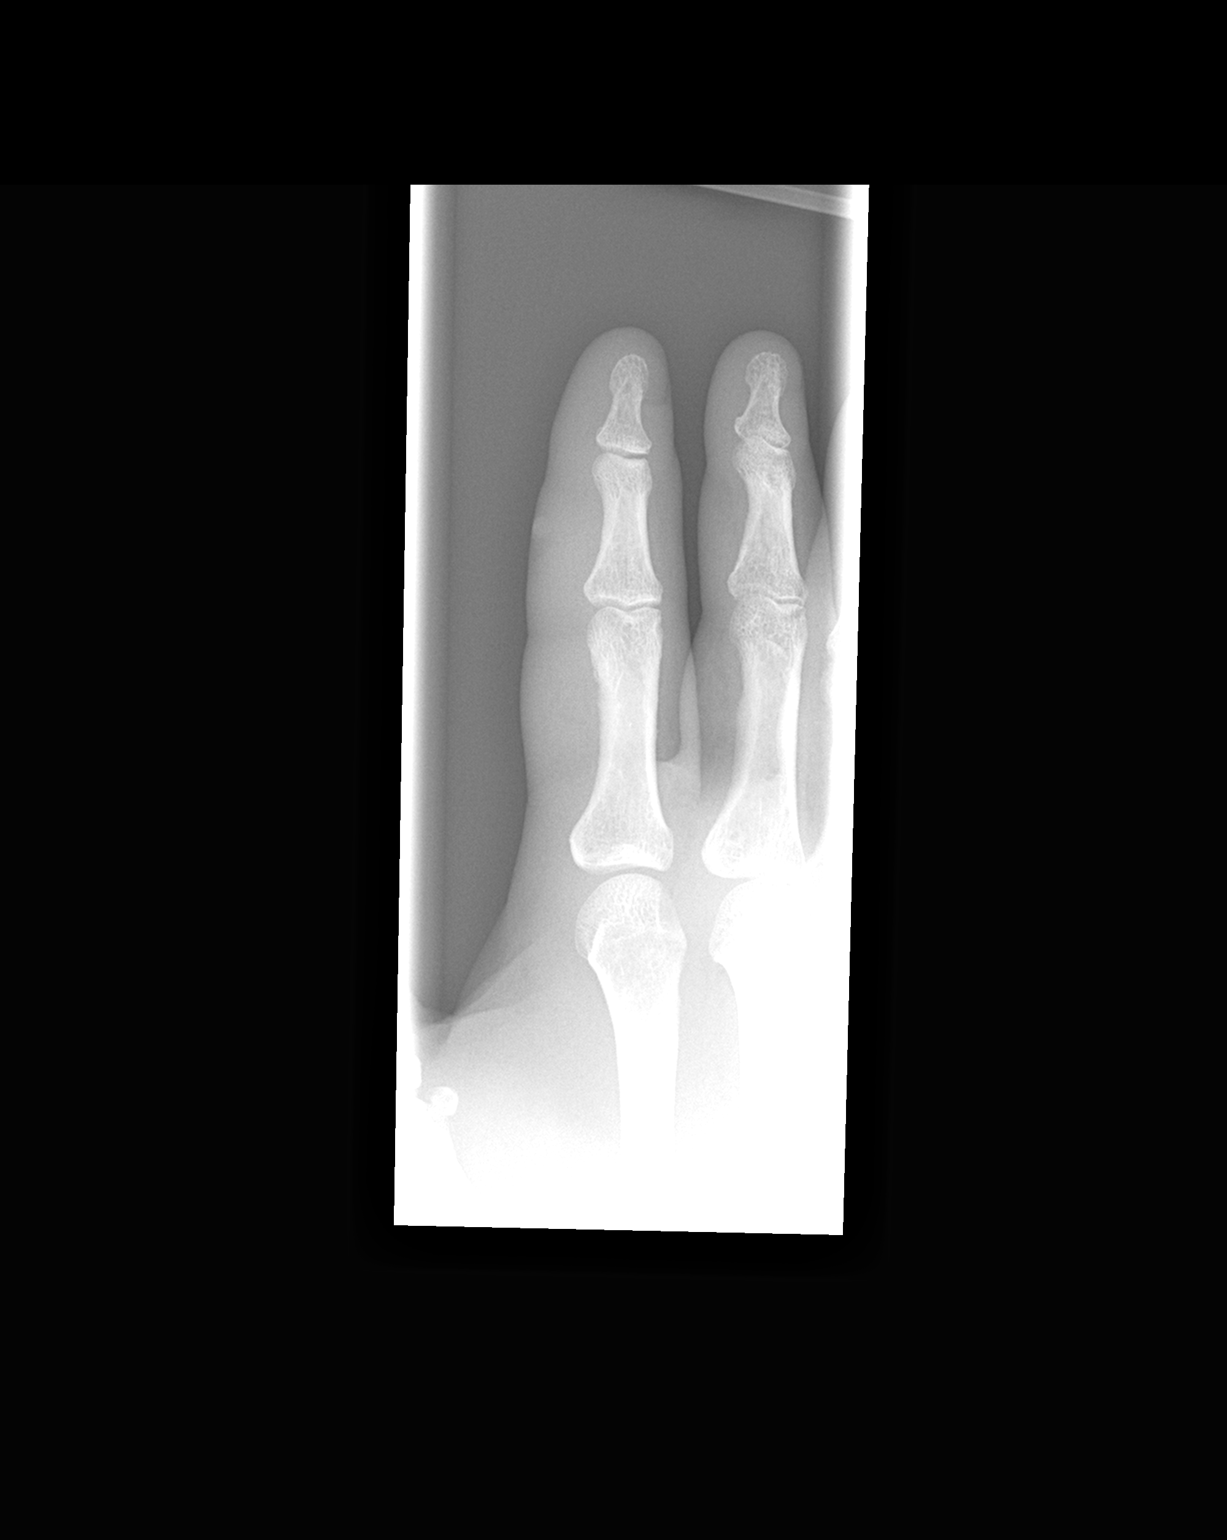

[view not recorded (3 of 3)]
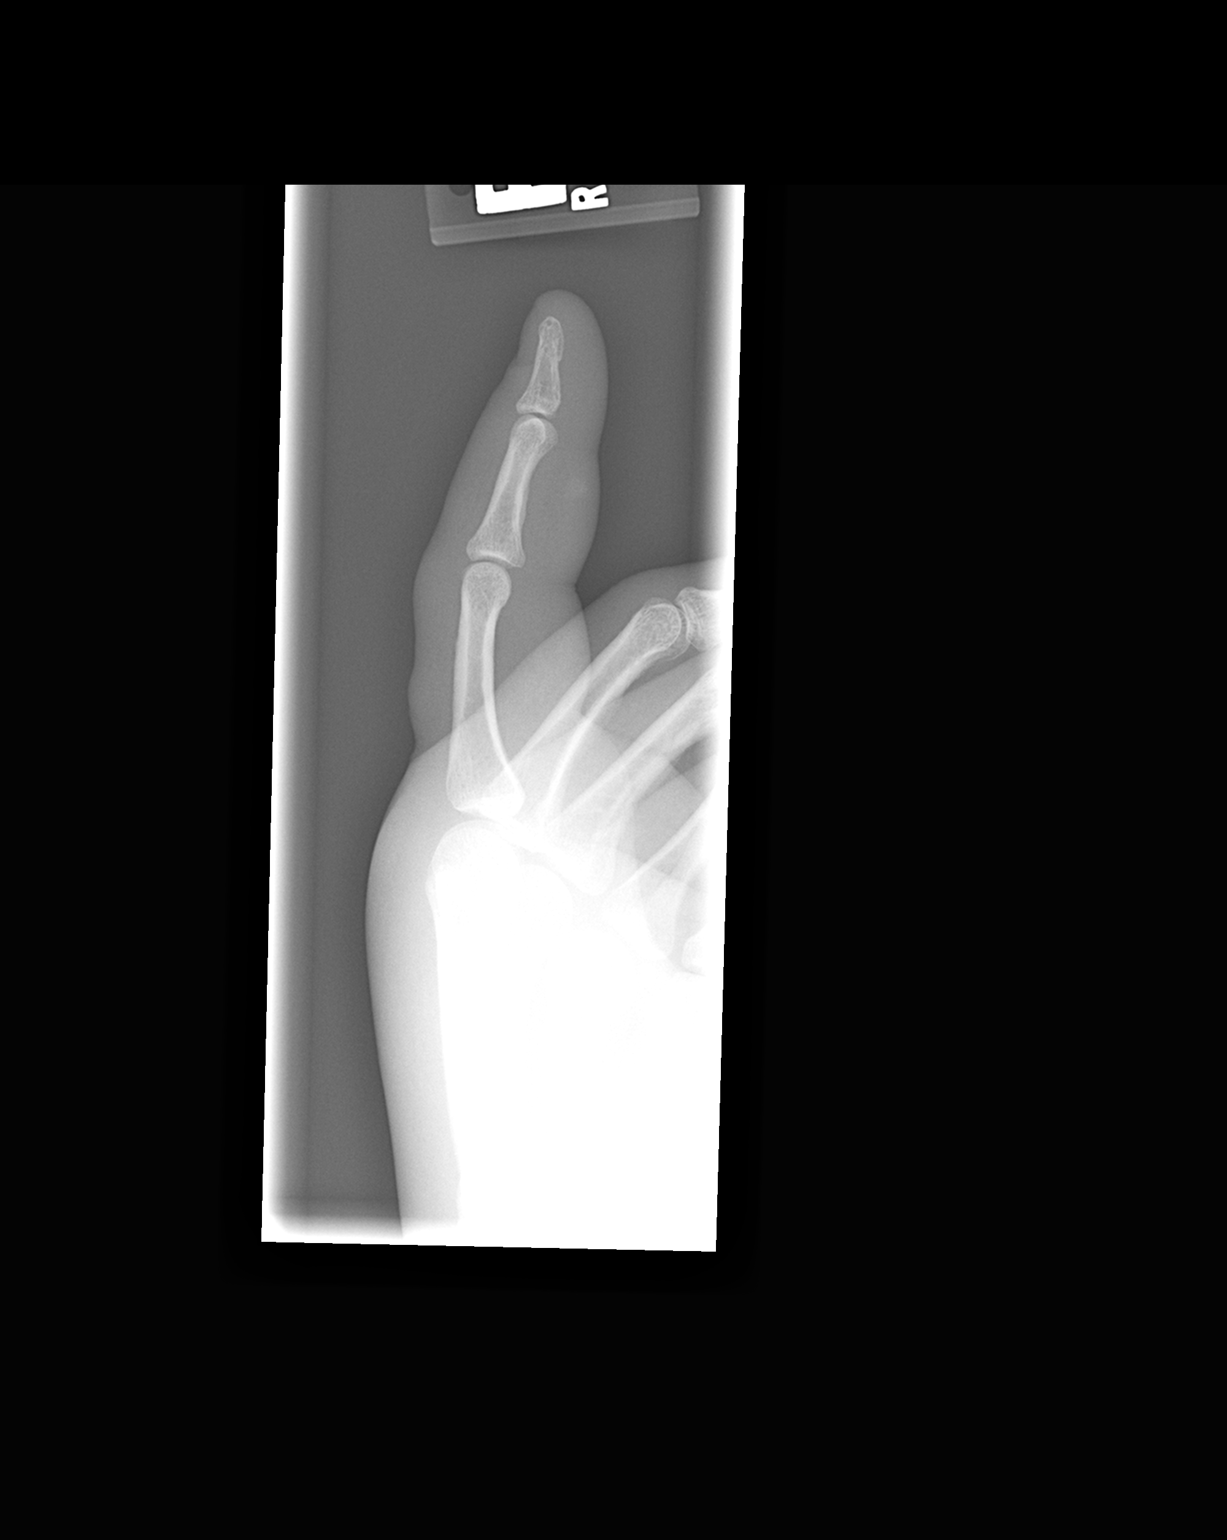

[3 of 3 positions shown; findings below may reference images not displayed]

FINDINGS: The heart is again seen to be enlarged.   The vascularity is normal.   The lungs are clear.   No effusions.   No acute soft tissue or bony findings.
IMPRESSION: Cardiomegaly.   No active disease.
 RIGHT INDEX FINGER ? 3 VIEW:
FINDINGS: The soft tissues are swollen, but there is no evidence of fracture or radiopaque foreign object.   No sign of arthropathy.
IMPRESSION: Soft tissue swelling.

## 2006-05-12 ENCOUNTER — Ambulatory Visit: Payer: Self-pay | Admitting: Infectious Diseases

## 2006-05-16 IMAGING — XA IR FLUORO GUIDE CV LINE*R*
1 series · 3 of 3 positions shown · non-contrast
Comparison: none

CLINICAL DATA: Index finger infection

PICC PLACEMENT WITH ULTRASOUND:
TECHNIQUE: The right arm was prepped with Betadine, draped in the usual sterile
fashion, and infiltrated locally with 1% lidocaine.  Ultrasound demonstrated
patency of the right basilic vein.  Under real-time ultrasound guidance, this
vein was accessed with a 21 gauge micropuncture needle.  The needle was
exchanged over a guidewire for a peel-away sheath, through which a 5 french
single lumen PICC catheter trimmed to 41 cm was advanced, positioned with its
tip at the distal SVC/right atrial junction.   Fluoroscopic spot chest
radiograph confirms appropriate catheter tip position.  The catheter was
flushed, secured to the skin with Prolene sutures, and covered with a sterile
dressing.  No immediate complication.

[Series 1: run · 3 of 3 slices shown]
[im 1/3]
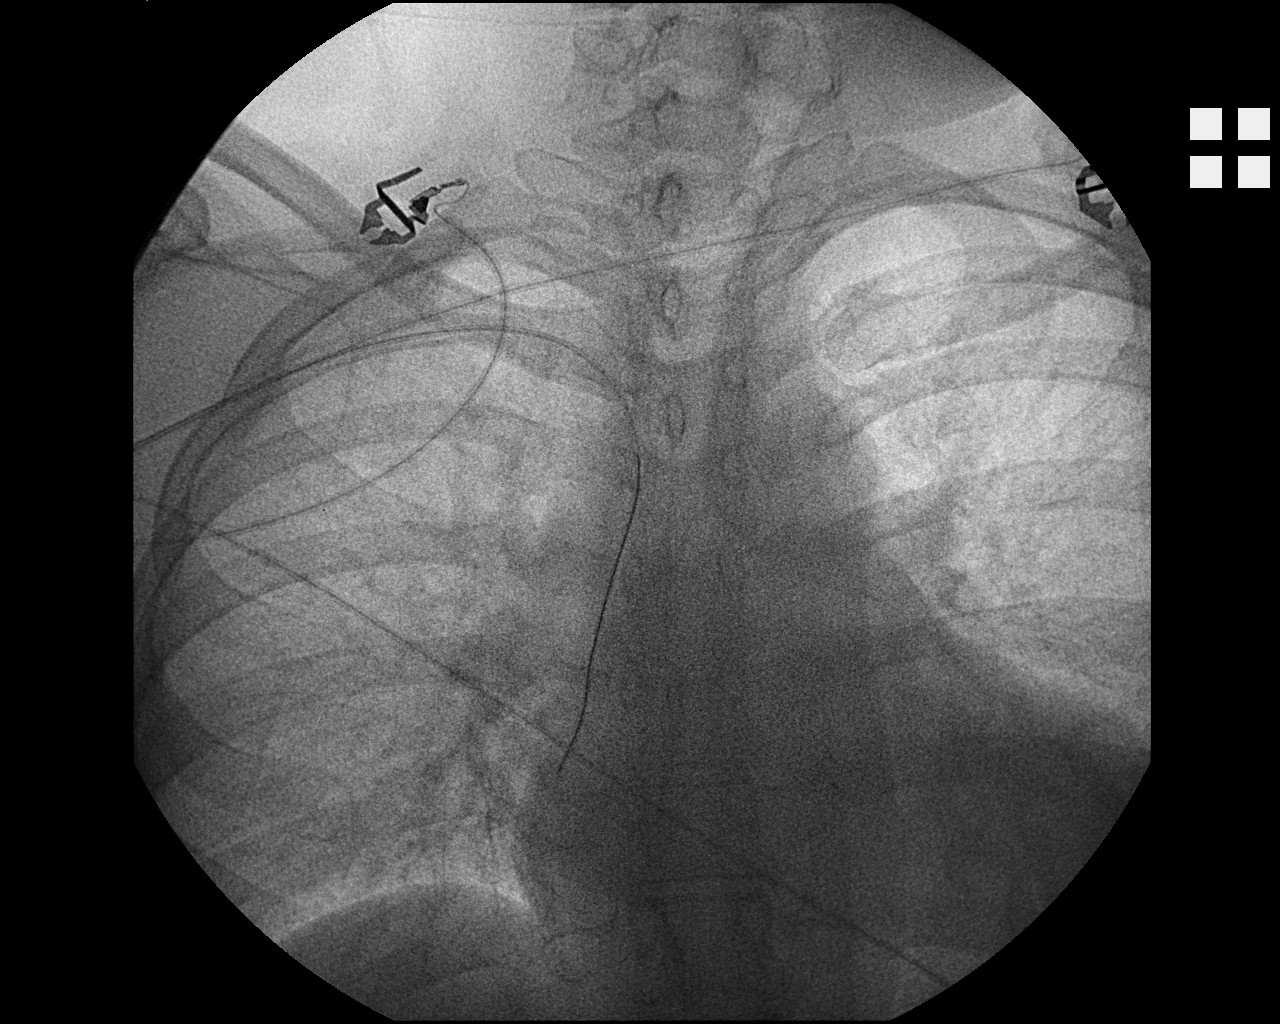
[im 2/3]
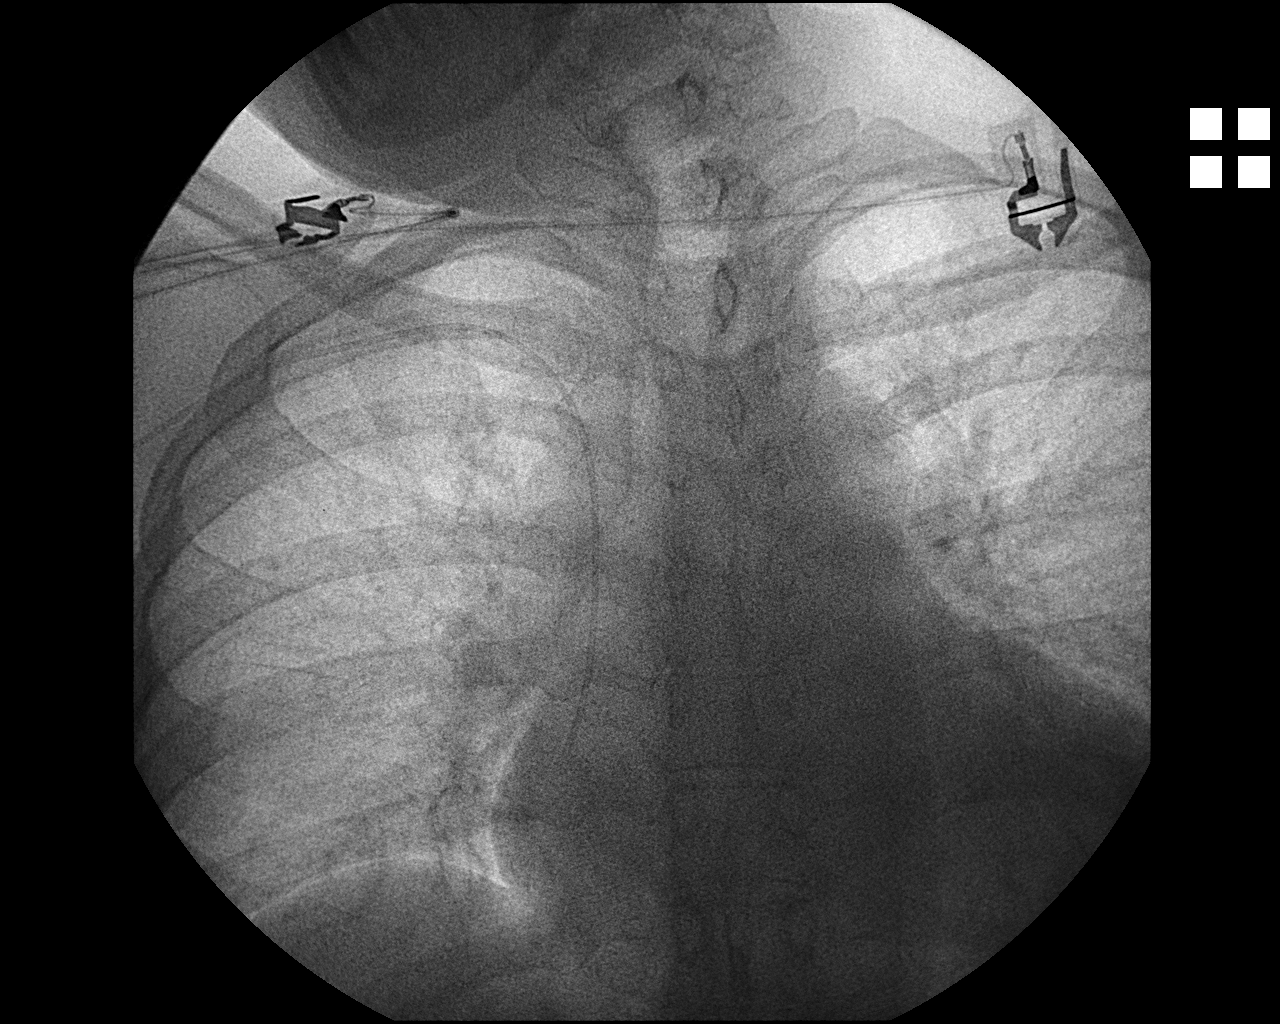
[im 3/3]
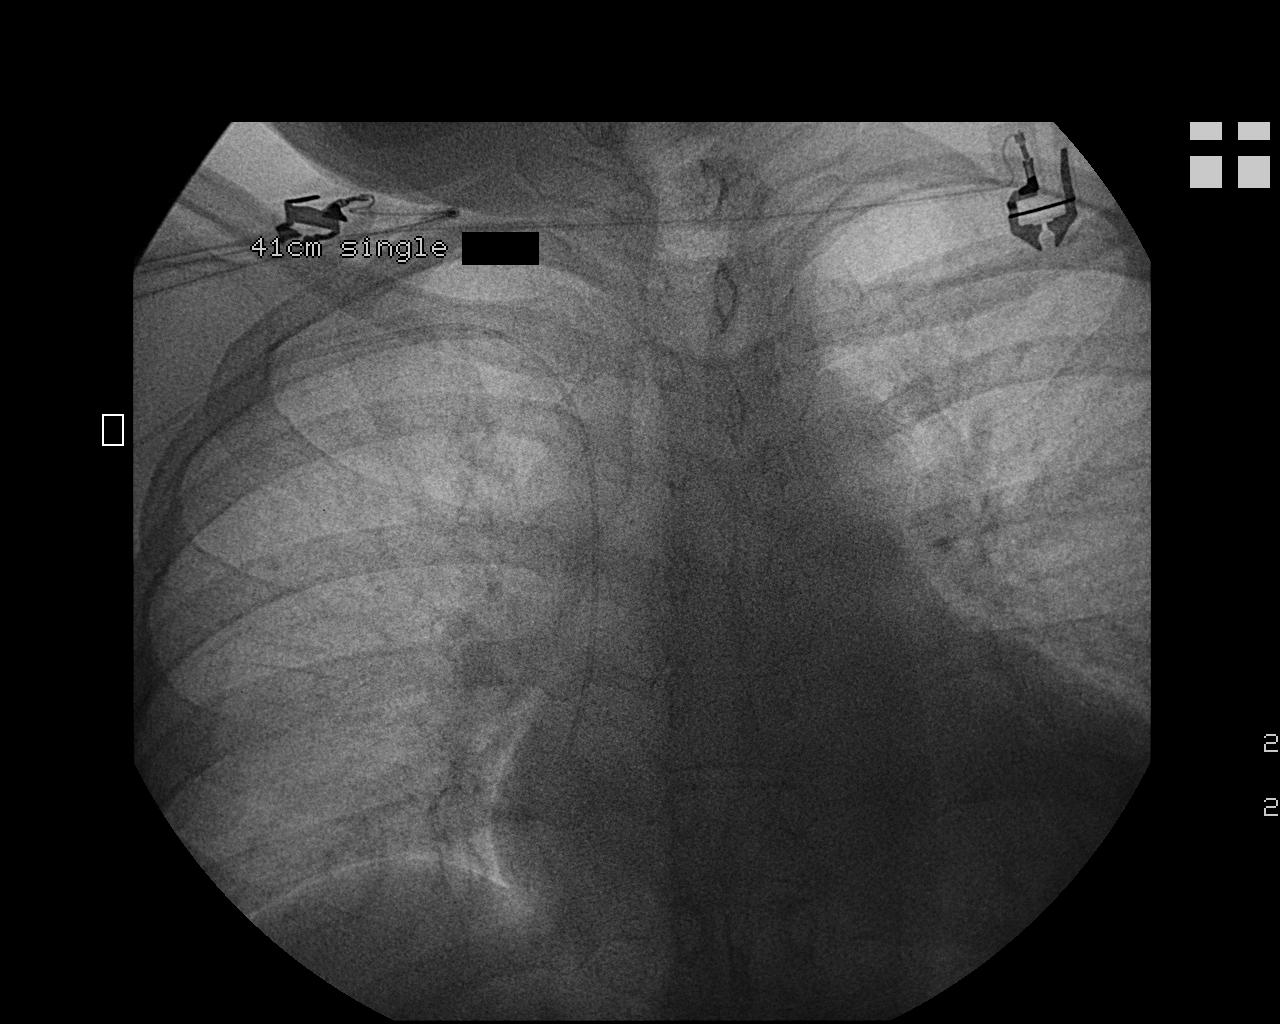

[3 of 3 positions shown; findings below may reference images not displayed]

IMPRESSION: Technically successful right arm PICC placement with ultrasound.  Ready for
routine use.

## 2006-05-22 ENCOUNTER — Ambulatory Visit: Payer: Self-pay | Admitting: Family Medicine

## 2006-06-11 ENCOUNTER — Ambulatory Visit: Payer: Self-pay | Admitting: Infectious Diseases

## 2006-06-17 ENCOUNTER — Ambulatory Visit: Payer: Self-pay | Admitting: Family Medicine

## 2006-07-02 ENCOUNTER — Ambulatory Visit: Payer: Self-pay | Admitting: Family Medicine

## 2006-08-08 ENCOUNTER — Ambulatory Visit: Payer: Self-pay | Admitting: Family Medicine

## 2006-08-08 ENCOUNTER — Ambulatory Visit (HOSPITAL_COMMUNITY): Admission: RE | Admit: 2006-08-08 | Discharge: 2006-08-08 | Payer: Self-pay | Admitting: Family Medicine

## 2006-09-07 ENCOUNTER — Encounter (INDEPENDENT_AMBULATORY_CARE_PROVIDER_SITE_OTHER): Payer: Self-pay | Admitting: *Deleted

## 2006-09-07 LAB — CONVERTED CEMR LAB

## 2006-09-08 ENCOUNTER — Ambulatory Visit: Payer: Self-pay | Admitting: Family Medicine

## 2006-09-23 ENCOUNTER — Ambulatory Visit: Payer: Self-pay | Admitting: Family Medicine

## 2007-01-29 DIAGNOSIS — E119 Type 2 diabetes mellitus without complications: Secondary | ICD-10-CM | POA: Insufficient documentation

## 2007-01-29 DIAGNOSIS — E1165 Type 2 diabetes mellitus with hyperglycemia: Secondary | ICD-10-CM | POA: Insufficient documentation

## 2007-01-29 DIAGNOSIS — D649 Anemia, unspecified: Secondary | ICD-10-CM | POA: Insufficient documentation

## 2007-01-29 DIAGNOSIS — I10 Essential (primary) hypertension: Secondary | ICD-10-CM | POA: Insufficient documentation

## 2007-01-29 DIAGNOSIS — E785 Hyperlipidemia, unspecified: Secondary | ICD-10-CM | POA: Insufficient documentation

## 2007-01-30 ENCOUNTER — Encounter (INDEPENDENT_AMBULATORY_CARE_PROVIDER_SITE_OTHER): Payer: Self-pay | Admitting: *Deleted

## 2007-03-27 ENCOUNTER — Ambulatory Visit (HOSPITAL_COMMUNITY): Admission: RE | Admit: 2007-03-27 | Discharge: 2007-03-27 | Payer: Self-pay | Admitting: Obstetrics and Gynecology

## 2007-03-27 ENCOUNTER — Encounter (INDEPENDENT_AMBULATORY_CARE_PROVIDER_SITE_OTHER): Payer: Self-pay | Admitting: Specialist

## 2007-05-01 ENCOUNTER — Ambulatory Visit: Payer: Self-pay | Admitting: Family Medicine

## 2007-05-01 DIAGNOSIS — F411 Generalized anxiety disorder: Secondary | ICD-10-CM | POA: Insufficient documentation

## 2007-05-01 DIAGNOSIS — N840 Polyp of corpus uteri: Secondary | ICD-10-CM | POA: Insufficient documentation

## 2007-05-01 DIAGNOSIS — N8500 Endometrial hyperplasia, unspecified: Secondary | ICD-10-CM | POA: Insufficient documentation

## 2007-05-01 DIAGNOSIS — G609 Hereditary and idiopathic neuropathy, unspecified: Secondary | ICD-10-CM | POA: Insufficient documentation

## 2007-06-16 ENCOUNTER — Telehealth (INDEPENDENT_AMBULATORY_CARE_PROVIDER_SITE_OTHER): Payer: Self-pay | Admitting: *Deleted

## 2007-07-10 ENCOUNTER — Encounter (INDEPENDENT_AMBULATORY_CARE_PROVIDER_SITE_OTHER): Payer: Self-pay | Admitting: Obstetrics and Gynecology

## 2007-07-10 ENCOUNTER — Ambulatory Visit (HOSPITAL_COMMUNITY): Admission: RE | Admit: 2007-07-10 | Discharge: 2007-07-11 | Payer: Self-pay | Admitting: Obstetrics and Gynecology

## 2007-07-28 IMAGING — CR DG CHEST 2V
2 series · 2 of 2 positions shown · non-contrast
Comparison: [DATE] and [DATE].

CLINICAL DATA: Preop for hardware removal. 
 CHEST - 2 VIEW:

[view not recorded (1 of 2)]
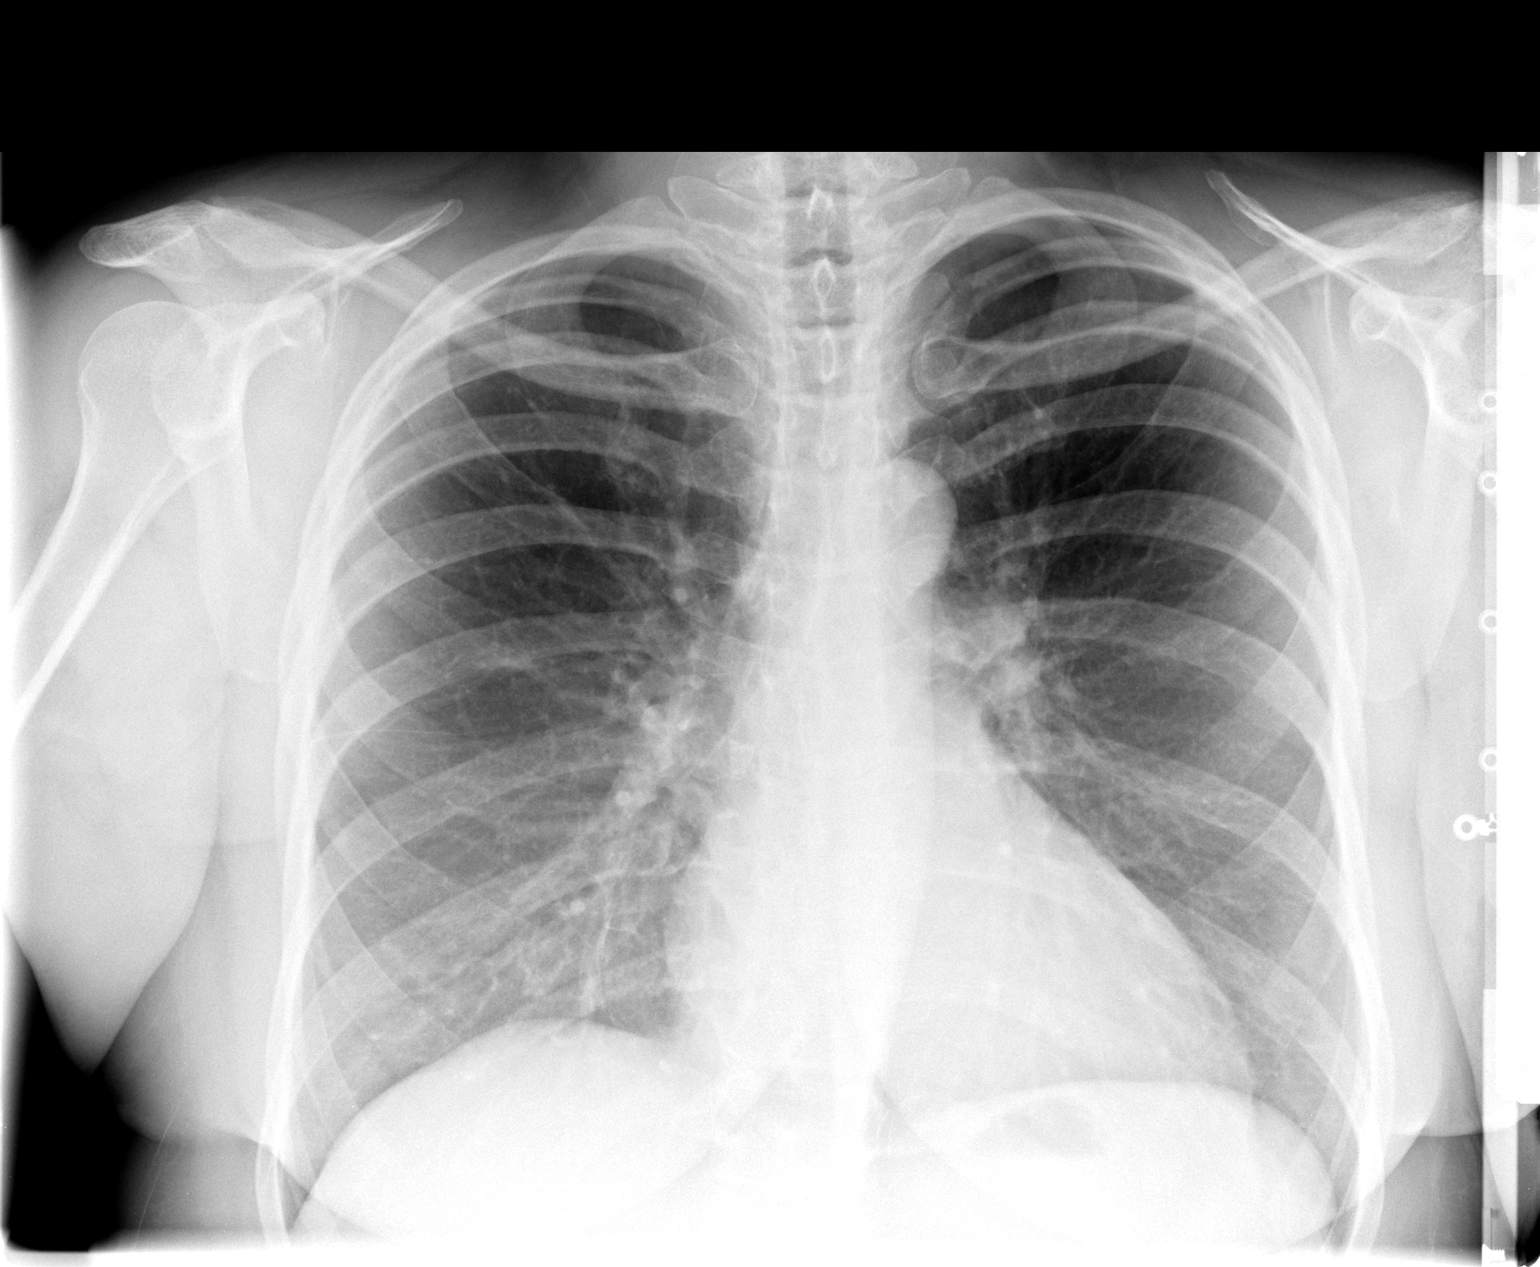

[view not recorded (2 of 2)]
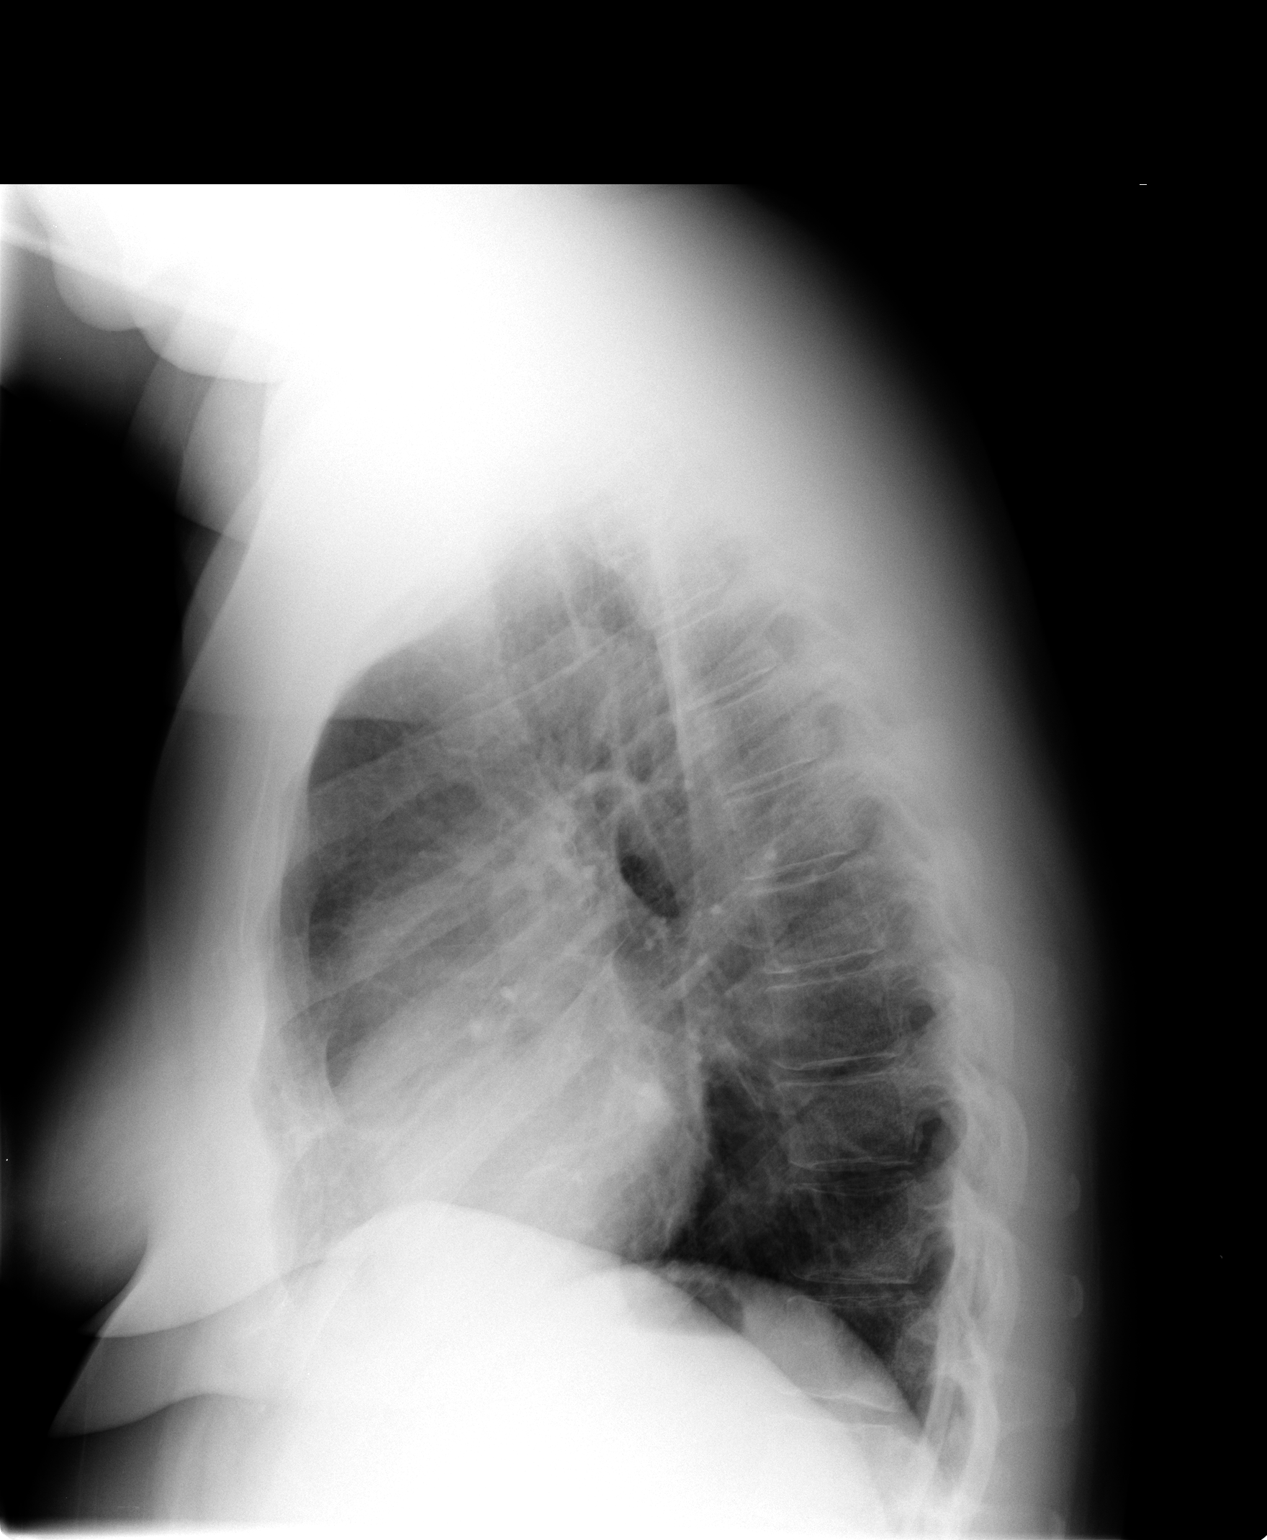

[2 of 2 positions shown; findings below may reference images not displayed]

FINDINGS: The heart is mildly enlarged.  There is no congestive heart failure or active disease.  Osseous structures intact.  The heart looks significantly smaller when compared to prior studies.  This may be a true finding or it may be an artifact of imaging the heart during systole vs diastole.
IMPRESSION: 1.  The heart is mildly enlarged ? it appears smaller when compared to prior exams.  
 2.  No active disease.

## 2007-07-29 ENCOUNTER — Ambulatory Visit (HOSPITAL_COMMUNITY): Admission: RE | Admit: 2007-07-29 | Discharge: 2007-07-30 | Payer: Self-pay | Admitting: Orthopedic Surgery

## 2007-12-15 ENCOUNTER — Encounter: Payer: Self-pay | Admitting: *Deleted

## 2007-12-17 ENCOUNTER — Encounter (INDEPENDENT_AMBULATORY_CARE_PROVIDER_SITE_OTHER): Payer: Self-pay | Admitting: *Deleted

## 2007-12-17 ENCOUNTER — Ambulatory Visit: Payer: Self-pay | Admitting: Family Medicine

## 2007-12-17 LAB — CONVERTED CEMR LAB
Bilirubin Urine: NEGATIVE
Glucose, Urine, Semiquant: >=1000
Nitrite: POSITIVE
Protein, U semiquant: 100
Specific Gravity, Urine: 1.025
Urobilinogen, UA: 1
WBC, UA: >20 cells/hpf
pH: 5.5

## 2008-01-06 ENCOUNTER — Encounter (INDEPENDENT_AMBULATORY_CARE_PROVIDER_SITE_OTHER): Payer: Self-pay | Admitting: *Deleted

## 2008-01-06 ENCOUNTER — Ambulatory Visit: Payer: Self-pay | Admitting: Family Medicine

## 2008-01-06 DIAGNOSIS — J301 Allergic rhinitis due to pollen: Secondary | ICD-10-CM | POA: Insufficient documentation

## 2008-01-06 LAB — CONVERTED CEMR LAB
Bilirubin Urine: NEGATIVE
Blood in Urine, dipstick: NEGATIVE
Glucose, Urine, Semiquant: 100
Hgb A1c MFr Bld: 11.3 %
LDL Cholesterol: 157 mg/dL
Nitrite: NEGATIVE
Protein, U semiquant: 100
Specific Gravity, Urine: >=1.03
Urobilinogen, UA: 1
pH: 5.5

## 2008-01-07 ENCOUNTER — Encounter (INDEPENDENT_AMBULATORY_CARE_PROVIDER_SITE_OTHER): Payer: Self-pay | Admitting: *Deleted

## 2008-01-07 LAB — CONVERTED CEMR LAB
ALT: 15 units/L (ref 0–35)
AST: 17 units/L (ref 0–37)
Albumin: 4.1 g/dL (ref 3.5–5.2)
Alkaline Phosphatase: 112 units/L (ref 39–117)
BUN: 20 mg/dL (ref 6–23)
CO2: 28 meq/L (ref 19–32)
Calcium: 10 mg/dL (ref 8.4–10.5)
Chloride: 102 meq/L (ref 96–112)
Cholesterol: 244 mg/dL — ABNORMAL HIGH (ref 0–200)
Creatinine, Ser: 1.05 mg/dL (ref 0.40–1.20)
Glucose, Bld: 71 mg/dL (ref 70–99)
HDL: 36 mg/dL — ABNORMAL LOW (ref >39)
LDL Cholesterol: 157 mg/dL — ABNORMAL HIGH (ref 0–99)
Potassium: 4.4 meq/L (ref 3.5–5.3)
Sodium: 141 meq/L (ref 135–145)
TSH: 1.873 microintl units/mL (ref 0.350–5.50)
Total Bilirubin: 0.4 mg/dL (ref 0.3–1.2)
Total CHOL/HDL Ratio: 6.8
Total Protein: 7.2 g/dL (ref 6.0–8.3)
Triglycerides: 255 mg/dL — ABNORMAL HIGH (ref <150)
VLDL: 51 mg/dL — ABNORMAL HIGH (ref 0–40)

## 2008-01-08 ENCOUNTER — Encounter (INDEPENDENT_AMBULATORY_CARE_PROVIDER_SITE_OTHER): Payer: Self-pay | Admitting: *Deleted

## 2008-01-15 ENCOUNTER — Encounter (INDEPENDENT_AMBULATORY_CARE_PROVIDER_SITE_OTHER): Payer: Self-pay | Admitting: *Deleted

## 2008-01-15 ENCOUNTER — Ambulatory Visit: Payer: Self-pay | Admitting: Family Medicine

## 2008-01-15 ENCOUNTER — Encounter: Admission: RE | Admit: 2008-01-15 | Discharge: 2008-01-15 | Payer: Self-pay | Admitting: *Deleted

## 2008-01-15 DIAGNOSIS — G5600 Carpal tunnel syndrome, unspecified upper limb: Secondary | ICD-10-CM | POA: Insufficient documentation

## 2008-01-19 ENCOUNTER — Encounter (INDEPENDENT_AMBULATORY_CARE_PROVIDER_SITE_OTHER): Payer: Self-pay | Admitting: *Deleted

## 2008-02-01 ENCOUNTER — Encounter (INDEPENDENT_AMBULATORY_CARE_PROVIDER_SITE_OTHER): Payer: Self-pay | Admitting: *Deleted

## 2008-03-30 ENCOUNTER — Telehealth (INDEPENDENT_AMBULATORY_CARE_PROVIDER_SITE_OTHER): Payer: Self-pay | Admitting: *Deleted

## 2008-04-13 ENCOUNTER — Encounter (INDEPENDENT_AMBULATORY_CARE_PROVIDER_SITE_OTHER): Payer: Self-pay | Admitting: *Deleted

## 2008-04-13 ENCOUNTER — Ambulatory Visit: Payer: Self-pay | Admitting: Family Medicine

## 2008-04-13 LAB — CONVERTED CEMR LAB: Hgb A1c MFr Bld: 11.2 %

## 2008-04-14 ENCOUNTER — Encounter (INDEPENDENT_AMBULATORY_CARE_PROVIDER_SITE_OTHER): Payer: Self-pay | Admitting: *Deleted

## 2008-04-14 LAB — CONVERTED CEMR LAB: Direct LDL: 102 mg/dL — ABNORMAL HIGH

## 2008-04-18 ENCOUNTER — Telehealth (INDEPENDENT_AMBULATORY_CARE_PROVIDER_SITE_OTHER): Payer: Self-pay | Admitting: *Deleted

## 2008-05-03 ENCOUNTER — Ambulatory Visit: Payer: Self-pay | Admitting: Family Medicine

## 2008-05-04 ENCOUNTER — Encounter (INDEPENDENT_AMBULATORY_CARE_PROVIDER_SITE_OTHER): Payer: Self-pay | Admitting: *Deleted

## 2008-05-16 ENCOUNTER — Ambulatory Visit: Payer: Self-pay | Admitting: Family Medicine

## 2008-05-16 DIAGNOSIS — R609 Edema, unspecified: Secondary | ICD-10-CM | POA: Insufficient documentation

## 2008-05-16 LAB — CONVERTED CEMR LAB
Glucose, Urine, Semiquant: 100
Nitrite: NEGATIVE
Protein, U semiquant: 30
Specific Gravity, Urine: >=1.03
Urobilinogen, UA: 0.2
WBC, UA: >20 cells/hpf
pH: 5.5

## 2008-06-07 ENCOUNTER — Ambulatory Visit: Payer: Self-pay | Admitting: Family Medicine

## 2010-12-02 HISTORY — PX: CATARACT EXTRACTION: SUR2

## 2010-12-23 ENCOUNTER — Encounter: Payer: Self-pay | Admitting: Family Medicine

## 2011-02-05 ENCOUNTER — Other Ambulatory Visit: Payer: Self-pay | Admitting: Family Medicine

## 2011-02-08 ENCOUNTER — Ambulatory Visit
Admission: RE | Admit: 2011-02-08 | Discharge: 2011-02-08 | Disposition: A | Discharge status: Home / Self Care | Payer: 59 | Source: Ambulatory Visit | Attending: Family Medicine | Admitting: Family Medicine

## 2011-02-08 IMAGING — US US ABDOMEN COMPLETE
1 series · 14 of 25 positions shown · non-contrast
Comparison: None.

CLINICAL DATA: Right upper quadrant pain.  Nausea and vomiting.

COMPLETE ABDOMINAL ULTRASOUND

[Series 1: us abdomen complete · 0.29mm/px · 14 of 79 slices shown]
[im 1/79]
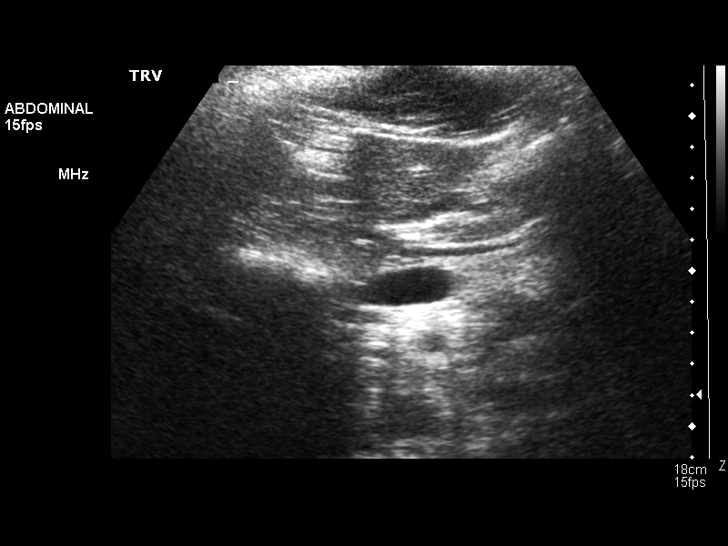
[im 7/79]
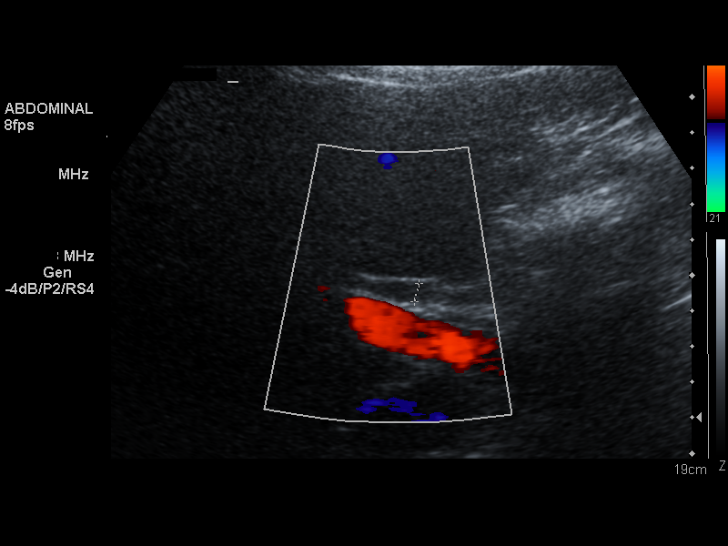
[im 14/79]
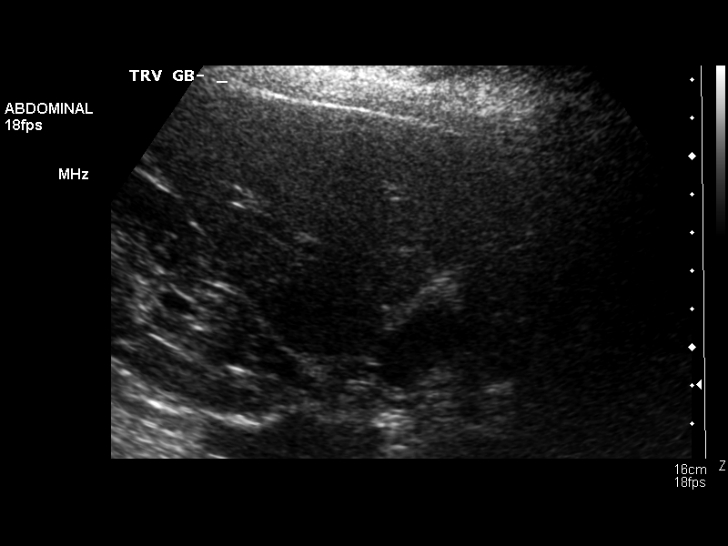
[im 20/79]
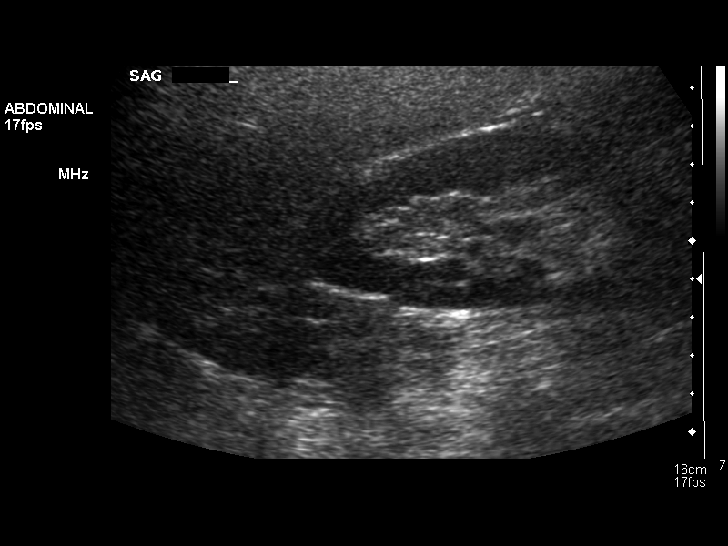
[im 27/79]
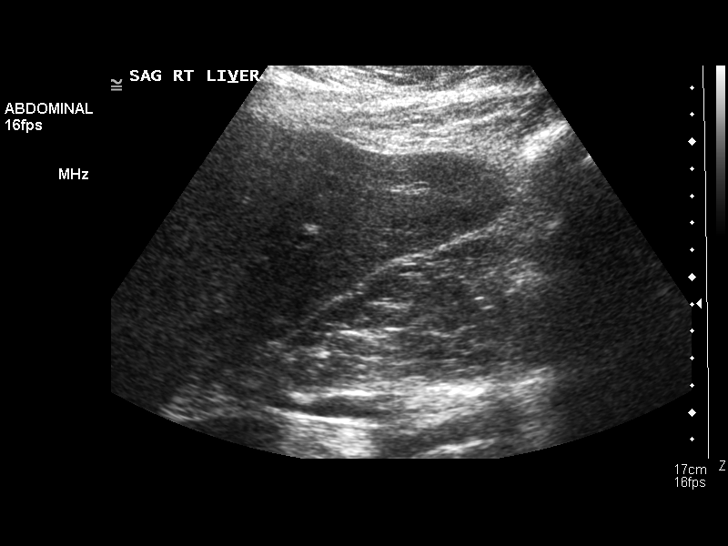
[im 30/79]
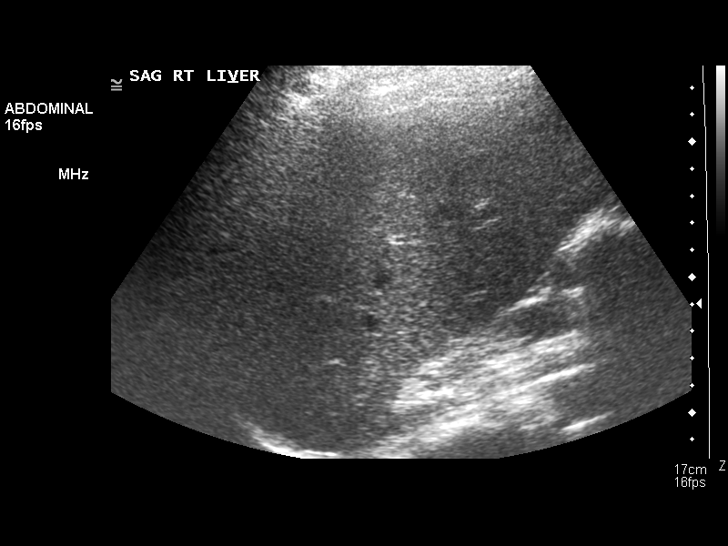
[im 36/79]
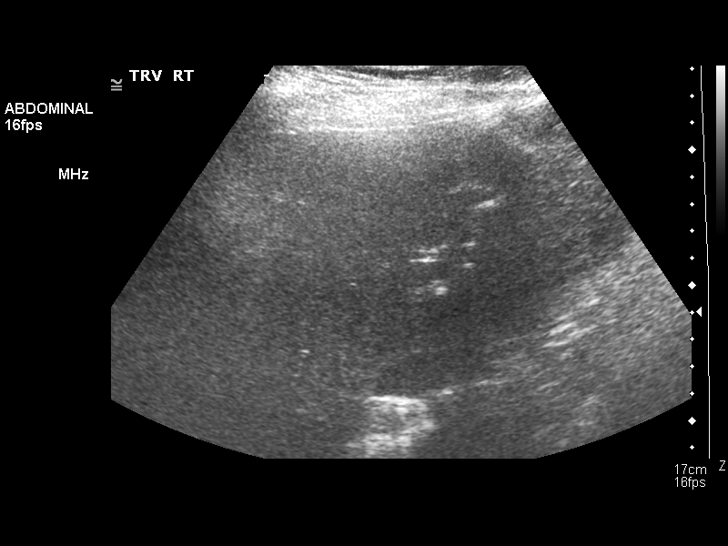
[im 43/79]
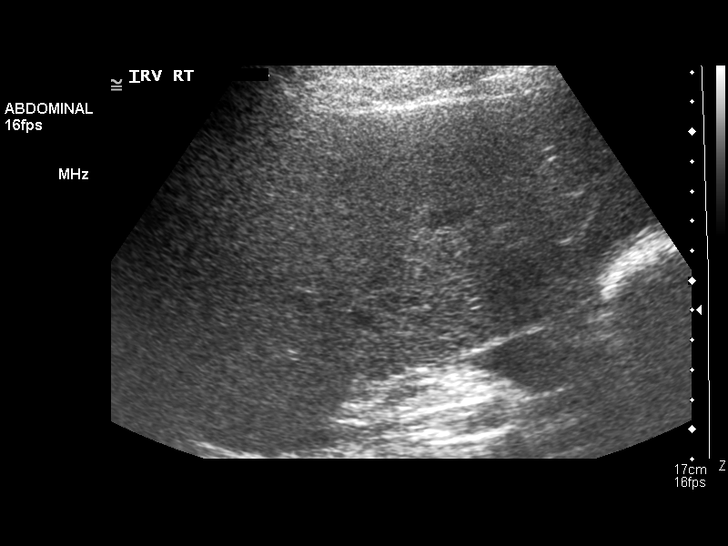
[im 49/79]
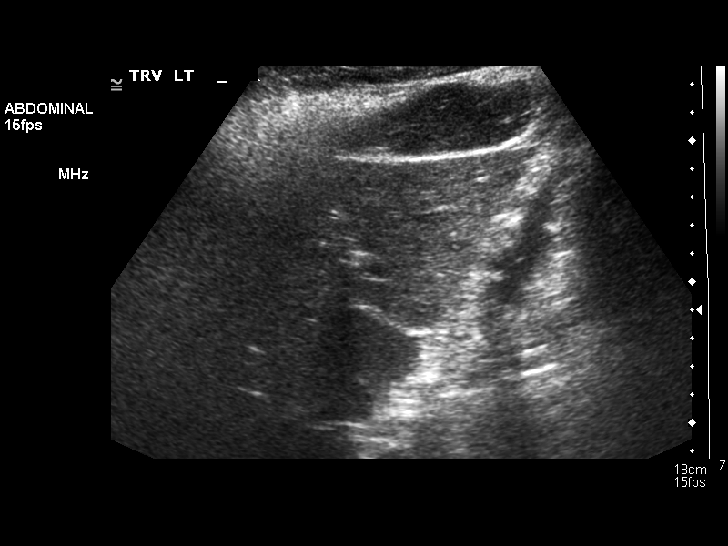
[im 53/79]
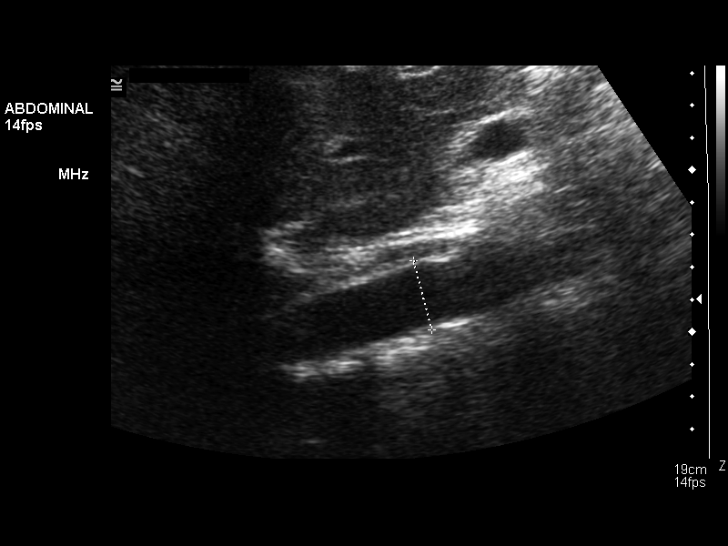
[im 59/79]
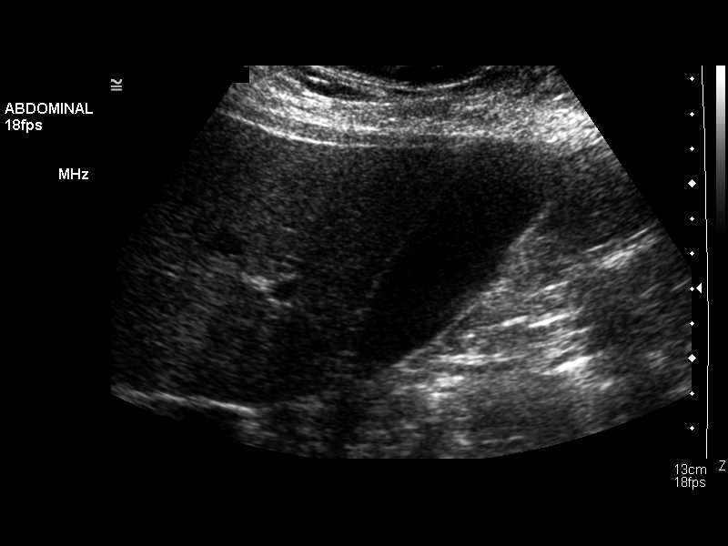
[im 66/79]
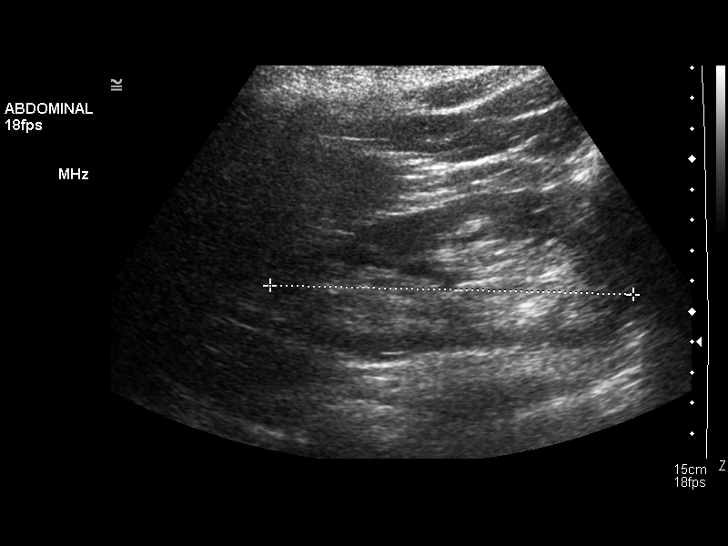
[im 72/79]
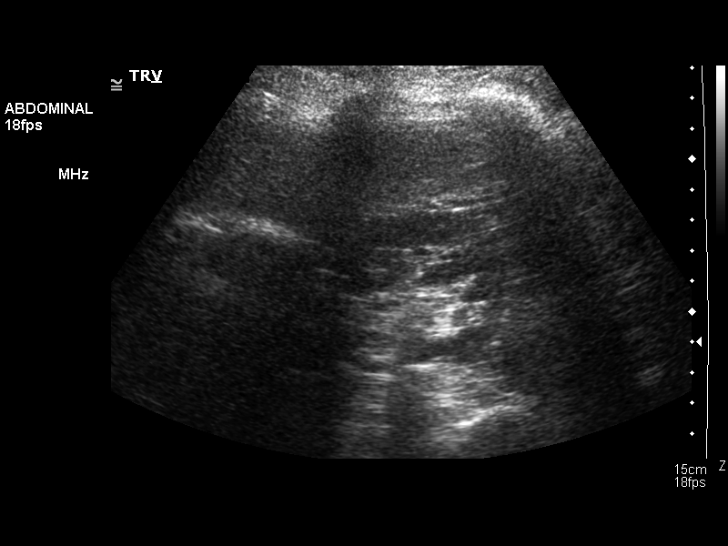
[im 79/79]
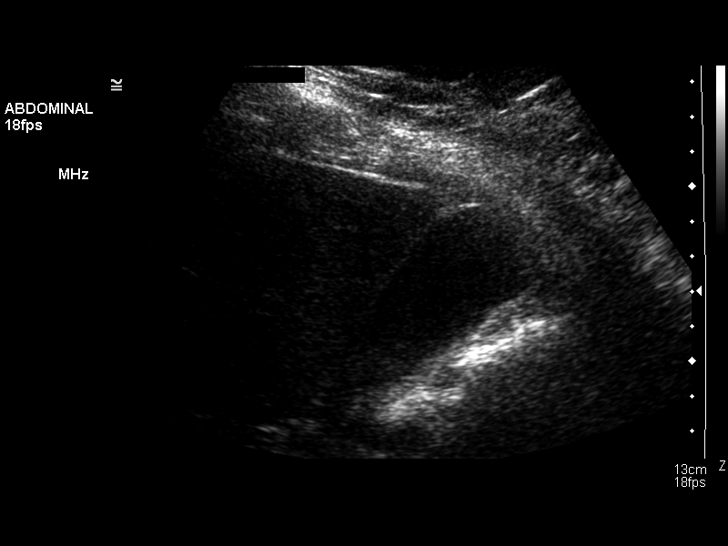

[14 of 25 positions shown; findings below may reference images not displayed]

FINDINGS: Gallbladder:  No gallstones, gallbladder wall thickening, or
pericholecystic fluid.

Common bile duct:  Measures 5 mm, which is within normal limits.

Liver:  No focal lesion identified.  Within normal limits in
parenchymal echogenicity.

IVC:  Appears normal.

Pancreas:  No focal abnormality seen.

Spleen:  9.5 cm length.  No abnormality identified.

Right Kidney:  11.4 cm in length.  Normal parenchymal echogenicity.
No evidence of renal mass or hydronephrosis.

Left Kidney:  12.1 cm in length.  Normal parenchymal echogenicity.
No evidence of renal mass or hydronephrosis.

Abdominal aorta:  No aneurysm identified.
IMPRESSION: Negative abdominal ultrasound.

## 2011-04-16 NOTE — Op Note (Signed)
NAME:  Brittany Silva, Brittany Silva              ACCOUNT NO.:  [id number]   MEDICAL RECORD NO.:  [id number]          PATIENT TYPE:  OIB   LOCATION:  1445                         FACILITY:  [facility]   PHYSICIAN:  Georges Lynch. Gioffre, M.D.DATE OF BIRTH:  1956/05/17   DATE OF PROCEDURE:  07/29/2007  DATE OF DISCHARGE:                               OPERATIVE REPORT   ASSISTANT:  Nurse.   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   POSTOPERATIVE DIAGNOSIS:  A healed trimalleolar fracture of the left  ankle with a Charcot joint.   OPERATION:  Removal of a plate and seven screws from the left fibula.   PROCEDURE:  Under general anesthesia, routine orthopedic prep and  draping of the left lower extremity was carried out.  The patient had 1  gm of IV Ancef.  At this time, the leg was exsanguinated with an Esmarch  and tourniquet was elevated to 150 mmHg.  Following that, an incision  was made over the lateral aspect of the left ankle.  Bleeders were  identified and cauterized.  I then went on and identified the plate  which was fractured in the midportion.  The plate had six screws and  there was an isolated seventh screw across the fracture site.  The plate  and seven screws were removed.  I checked the ankle for stability and  the ankle fracture was healed.  I thoroughly irrigated out the area and  then sutured the wound closed in the usual fashion.  Metal staples was  used to close skin.  A sterile dressing was applied.  The patient left  the operating room in satisfactory condition.   Postop, she will be on the Coumadin and heparin protocol and I will keep  her overnight.  She is diabetic and we need to observe her.           ______________________________  Georges Lynch. Darrelyn Hillock, M.D.     RAG/MEDQ  D:  07/29/2007  T:  07/30/2007  Job:  [id number]   cc:   Windy Fast A. Darrelyn Hillock, M.D.  Fax: [phone]

## 2011-04-16 NOTE — Op Note (Signed)
NAME:  Brittany Silva, Brittany Silva NO.:  [id number]   MEDICAL RECORD NO.:  [id number]          PATIENT TYPE:  INP   LOCATION:  9304                          FACILITY:  [facility]   PHYSICIAN:  Miguel Aschoff, M.D.       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   PROCEDURE:  Laparoscopically-assisted total vaginal hysterectomy and  bilateral salpingo-oophorectomy.   SURGEON:  Dr. Miguel Aschoff.   ASSISTANT:  Dr. Konrad Dolores.   ANESTHESIA:  General.   COMPLICATIONS:  None.   JUSTIFICATION:  The patient is a 55 year old white female who presented  with irregular vaginal bleeding and underwent dilatation and curettage  in April 2008.  The pathology report at that time revealed focal, simple  and complex hyperplasia without atypia.  There was no carcinoma noted.  In view of these findings, the patient has elected to undergo definitive  therapy via hysterectomy.  The risks and benefits of laparoscopically  assisted hysterectomy and bilateral salpingo-oophorectomy were discussed  with the patient and informed consent has been obtained.   DESCRIPTION OF PROCEDURE:  The patient was taken to the operating room,  placed in a supine position and general anesthesia was administered  without difficulty.  She was then placed in the dorsal lithotomy  position, prepped and draped in the usual sterile fashion.  A Foley  catheter was inserted as well as a Hulka tenaculum.  Examination at this  point revealed the uterus to be globular, somewhat irregular in shape.  No adnexal masses were noted.  Attention was then directed to the  umbilicus where a small infraumbilical incision was made, a Veress  needle was inserted and then the abdomen was  insufflated with 3 liters  of CO2.  Following this, the trocar to laparoscope was placed followed  by the laparoscope itself. Once the laparoscope was in position, two  additional ports were established in the right and left lower quadrants,  5 mm in size and each placed under direct visualization.  Systematic  inspection of the abdomen revealed the anterior bladder peritoneum to be  unremarkable.  There were no signs of any hernias in the inguinal areas.  The round ligaments were unremarkable.  The ovaries were inspected and  were noted be within normal limits.  Tubes were normal except for  segments missing from prior tubal sterilization. The  uterus was  globular in shape and irregular and had fibroids present.  There were no  lesions noted in the cul-de-sac. The intestinal surfaces were  unremarkable.  The appendix was visualized and appeared to be within  normal limits.  The liver was unremarkable, the gallbladder was  visualized and appeared uninflamed. No other abnormalities were noted.  At this point, the gyrus cauterization unit was introduced and on the  right side the infundibulopelvic ligament was identified, grasped,  cauterized and cut with care to  avoid any injury to the ureter.  The  dissection was then carried medially below the mesovarian ligament and  the mesosalpinx. These were then grasped, cauterized and cut. The round  ligament was then grasped, cauterized and cut and then the dissection  continued until the uterine vessels were found and these pedicles again  were cauterized and cut. The identical procedure was then carried out on  the left side again with care to avoid any injury to the bowel or  ureter.  Once again the dissection continued using the gyrus unit along  the mesovarian and mesosalpinx. Again the round ligaments grasped,  cauterized and cut and then dissection again continued to the level of  the uterine vessels. At this point, attention was directed  vaginally.  The patient was placed in the lithotomy position, a weighted speculum  was placed in the vaginal vault.  The anterior cervical lip was grasped  with a tenaculum and then injected with 1% Xylocaine with epinephrine  for hemostasis.  The cervical mucosa was then circumscribed and then  dissected anteriorly and posteriorly until the peritoneal reflections  were found. The posterior peritoneum was then entered and once this was  done using curved Heaney clamps, the uterosacral ligaments were grasped,  clamped, cut and suture ligated using suture ligatures of zero Vicryl.  The cardinal ligaments were clamped, cut and suture ligated in a similar  fashion.  Additional bites were taken of the paracervical fascia using  curved Heaney clamps, all pedicles cut and suture ligated using suture  ligatures of zero Vicryl.  Attention was then directed anteriorly and  the anterior peritoneum was then entered with again care to avoid any  injury to the bladder. Then again using curved Heaney clamp, the  parametria tissue was clamped, cut and suture ligated using suture  ligatures of zero Vicryl and this continued until it was possible to  deliver the uterine fundus through the cul-de-sac.  The residual tissue  of the broad ligament was then identified, clamped with Heaney clamps  and these pedicles were cut and the specimen was excised. The specimen  consisted of the cervix, uterus, tubes and ovaries and these were sent  for histologic study.  These residual pedicles were then tied using  ligatures of zero Vicryl.  Inspection made for hemostasis and hemostasis  appeared be excellent. At this point, the posterior vaginal cuff was run  using running interlocking zero Vicryl suture for hemostasis and then a  pursestring was placed in the peritoneum closing it without difficulty.  This was a suture of zero Vicryl.  At this point, the vaginal cuff was  closed using running interlocking zero Vicryl  suture. An Iodoform pack  was placed, urine was clear and at this point the patient was taken out  lithotomy position and attention was directed back to the abdomen for  final inspection via the laparoscope. Laparoscopic inspection revealed  excellent hemostasis, no bleeding was noted in the abdomen.  All  pedicles appeared to be secure and at this point with no abnormalities  being noted, the CO2 was allowed to escape, the instruments were removed  and the small incisions were closed using subcuticular 4-0 Vicryl.  The  estimated blood loss was approximately 150 mL.  The patient tolerated  the procedure well and was taken to the recovery room in satisfactory  condition.      Miguel Aschoff, M.D.  Electronically Signed     AR/MEDQ  D:  07/10/2007  T:  07/10/2007  Job:  [id number]

## 2011-04-19 NOTE — H&P (Signed)
NAME:  OMER, MONTER NO.:  [id number]   MEDICAL RECORD NO.:  [id number]          PATIENT TYPE:  INP   LOCATION:  5041                         FACILITY:  [facility]   PHYSICIAN:  Zenaida Deed. Mayford Knife, M.D.DATE OF BIRTH:  1956-08-01   DATE OF ADMISSION:  05/11/2006  DATE OF DISCHARGE:                                HISTORY & PHYSICAL   CHIEF COMPLAINT:  Right index finger pain.   HISTORY OF PRESENT ILLNESS:  A 55 year old white female presents with right  index finger swelling and pain x5 days.  She has a history of diabetes  mellitus type 2 and was started on Doxycycline on Friday, May 09, 2006, and  Levaquin was added on May 10, 2006.  Her symptoms have persisted despite  antibiotics and she has actually worsened.  She was seen over at [facility] earlier today and sent over for evaluation by the hand surgeon,  admission to the hospital, and diabetes management per family practice  teaching service.  She denies fever, headaches, chest pain, nausea and  vomiting, diarrhea, constipation, blurry vision, dyspnea, etc.   REVIEW OF SYSTEMS:  See HPI, otherwise negative.   ALLERGIES:  SULFA, DARVOCET, CODEINE.   PAST MEDICAL HISTORY:  1.  Diabetes mellitus type 2 x14 years.  2.  Hypertension.  3.  Osteoarthritis.  4.  Arrhythmia on Toprol XL.  5.  Status post left foot fracture on October 03, 2005, from a fall      secondary to hypoglycemia.  Suspicious for osteopenia/osteoporosis.  6.  History of menorrhagia with some chronic anemia and iron intolerance.   FAMILY HISTORY:  Father died at 75 of MI.  Mother died of breast cancer and  bone cancer at 29.  She lives with her husband and two sons.  No pets, no  alcohol, and no smoking.   MEDICATIONS:  1.  Lantus 58 units every evening.  2.  Meperidine Forte p.r.n. only uses about 30 pills yearly for back pain.  3.  Metformin 500 b.i.d. started yesterday.  4.  Toprol XL 25 mg.  5.  Altace 5 mg daily.  6.  Mobic  every morning.  7.  Lasix three times per month as needed for lower extremity swelling.  8.  Doxycycline.  9.  Levaquin.   PHYSICAL EXAMINATION:  VITAL SIGNS:  Temperature 97.3, pulse 69, blood  pressure 155/66, respirations 16, saturation 95% on room air.  GENERAL:  In no acute distress, obese, white female.  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  Pupils equal,  round, and reactive to light.  Normal hearing.  Oropharynx normal with poor  dentition.  CARDIOVASCULAR:  Regular rate and rhythm with no murmurs.  PULMONARY:  Clear to auscultation bilaterally.  ABDOMEN:  Obese, soft, and nontender with normal active bowel sounds.  No  hepatosplenomegaly.  EXTREMITIES:  The right hand has a pustule on the medial index finger at  approximately the PIP joint with severe swelling and limited range of  motion.  There is pain to palpation on this area.  It is very erythematous.  Distal sensation/vascular intact.  The patient also has 2+ bilateral ankle  swelling, worse on the left, pitting.  GENITOURINARY:  Deferred.  BACK:  No CVA tenderness/scoliosis.  LYMPHATICS:  No lymphadenopathy.  PSYCHIATRIC:  Alert and oriented x3.   LABORATORY DATA:  White count 11.9, hemoglobin 12.3, hematocrit 36.4,  platelets 292, absolute neutrophil count 10.1.  Sodium 137, potassium 5.3,  chloride 102, BUN 18, creatinine 0.8, bicarb 23.  Blood sugar 120, alkaline  phosphatase 106, total bilirubin 1.5, AST 33, ALT 19, calcium 8.8, albumin  3.3.   ASSESSMENT:  A 55 year old white female with right index finger  cellulitis/abscess and history of diabetes mellitus.   Problem 1.  For the finger, the hand surgeon has been consulted and they  will perform an I&D on May 11, 2006.  Antibiotics to be written per  surgery.   Problem 2.  Diabetes mellitus.  The patient will continue her home regimen  for now including Metformin 500 b.i.d. and Lantus 50 units q.p.m.  Goal  CBG's less than 150,  adjusted as necessary.  Hemoglobin A1C is pending at  [facility].   Problem 3.  Hypertension.  Continue Toprol XL and Altace.   Problem 4.  Edema.  This is a positional edema that worsens when she is on  her feet and improves with putting her legs up.  We will continue Lasix  p.r.n.   Problem 5.  Hyperkalemia.  This is mild at this time.  No intervention.  We  will watch closely and consider discontinuing Altace if hyperkalemia  persists.   Problem 6.  Hyperbilirubinemia.  This also is very mild and we will  fractionate tomorrow.  No gallbladder symptoms at this time.      Angeline Slim, M.D.    ______________________________  Zenaida Deed. Mayford Knife, M.D.    AL/MEDQ  D:  05/12/2006  T:  05/12/2006  Job:  [id number]

## 2011-04-19 NOTE — Consult Note (Signed)
NAME:  Brittany Silva, Brittany Silva NO.:  [id number]   MEDICAL RECORD NO.:  [id number]          PATIENT TYPE:  INP   LOCATION:  5041                         FACILITY:  [facility]   PHYSICIAN:  Artist Pais. Weingold, M.D.DATE OF BIRTH:  01-16-1956   DATE OF CONSULTATION:  05/11/2006  DATE OF DISCHARGE:                                   CONSULTATION   Brittany Silva is a 55 year old female who presents today with what appears to  be a right index finger flexor sheath infection that has been going on for  about 48 to 72 hours.  She has been in urgent care in the past, presents  today with a swollen tender index finger with Kanavel's sign.  She has no  drug allergies that she mentions to me except codeine.  She is an insulin  dependent diabetic.  She has other medical issues including hypertension.  She is under the care of Dr. Margrett Rud for her medical issues.   PHYSICAL EXAMINATION:  Swollen, tense index finger on the right with pain  over the flexor sheath, Kanavel's signs are positive. She also has some  swelling over the dorsal aspect and most of her swelling appears to be  radial over the PIP joint with a small lesion there with some drainage.  X-  ray showed soft tissue swelling only.   IMPRESSION:  A 55 year old female with probable extensor and flexor sheath  infection of 48 to 72 hours.   RECOMMENDATIONS:  Will admit her to the hospital, IV antibiotics and I&D as  necessary.      Artist Pais Mina Marble, M.D.  Electronically Signed     MAW/MEDQ  D:  05/13/2006  T:  05/13/2006  Job:  [id number]

## 2011-04-19 NOTE — Consult Note (Signed)
NAME:  Brittany Silva, Brittany Silva NO.:  [id number]   MEDICAL RECORD NO.:  [id number]          PATIENT TYPE:  OIB   LOCATION:  1418                         FACILITY:  [facility]   PHYSICIAN:  Brittany Silva, M.D. LHCDATE OF BIRTH:  March 31, 1956   DATE OF CONSULTATION:  10/09/2005  DATE OF DISCHARGE:                                   CONSULTATION   PRIMARY CARE PHYSICIAN:  Brittany Silva, M.D.   ORTHOPEDIST:  Brittany Silva, M.D.   REASON FOR CONSULTATION:  Bigeminy/trigeminy.   HISTORY OF PRESENT ILLNESS:  Brittany Silva is a delightful 55 year old woman  with a history of diabetes mellitus, hypertension, (extra beats) but no  known history of sustained arrhythmias for known coronary artery disease,  who we are asked to consult on, given her perioperative bigeminy and  trigeminy.   As above, Brittany Silva denies any known history of coronary artery disease.  She does say that she has a long history of extra beats.  She has had EKGs  in the past but has never had a formal workup with this with a Holter  monitor or echocardiogram.  She says these extra beats also run in her  family.  Several days ago, she fell and fractured her right ankle in the  setting of severe hypoglycemia with a blood sugar about 23.  She reported  today to have open reduction/internal fixation of the ankle.  This was  successful; however, her perioperative course was complicated by frequent  PVCs in a bigeminy and trigeminy pattern.  This was asymptomatic.  She was  hemodynamically stable.  She was seen by [facility] hospitalist service, who  placed her on telemetry.  She has had three sets of cardiac markers, which  are all negative.  They have checked her electrolytes, and her potassium and  magnesium are all fine.  TSH was also checked, which was also within normal  limits.  She denies any complaints currently.   At baseline, she denies any exertional chest pain, although she does say she  gets some  mild dyspnea on exertion and occasional lower extremity edema,  which she manages with Lasix.  She also has occasional PND.  She denies any  history of syncope.  There has been no family history of sudden cardiac  death.   REVIEW OF SYSTEMS:  As per HPI and problem list, otherwise all systems  negative.   PROBLEM LIST:  1.  Frequent extra beats.  2.  Diabetes mellitus x13 years.  3.  Borderline hypertension.  4.  Mild obesity.   MEDICATIONS ON ADMISSION:  1.  Insulin.  2.  Mepergan Fortis.  3.  Aspirin 325 daily.  4.  Lasix 40 mg p.r.n.  5.  Tylenol.   DRUG ALLERGIES:  1.  CODEINE gives her throat swelling.  2.  SULFA gives her hives.  3.  DARVOCET.   SOCIAL HISTORY:  She works downtown in [employer]  She is married with  two grown sons.  She does not smoke cigarettes.  She has an occasional  alcoholic drink.   FAMILY HISTORY:  Her father  died of an MI at the age of 80.  There is also a  history of ectopic beats in her mother and some uncles.  Once again, there  is no family history of sudden cardiac death.   PHYSICAL EXAMINATION:  VITAL SIGNS:  Blood pressure currently is 166/75,  heart rate 89, temperature 99.3.  GENERAL:  She is a very pleasant woman lying in bed in no acute distress.  HEENT:  Sclerae are anicteric.  EOMI.  There is no xanthelasma.  Mucous  membranes are moist.  NECK:  Supple.  There is no JVD.  Carotids are 2+ bilaterally bruits.  There  is no lymphadenopathy or thyromegaly.  LUNGS:  Clear to auscultation.  CARDIAC:  She has a regular rate and rhythm with a frequent ectopy.  There  are no murmurs, rubs or gallops.  ABDOMEN:  Mildly obese.  Soft, nontender, nondistended.  There is no  hepatosplenomegaly, bruits, or masses.  Abdominal aortic impulse does not  appear wide.  There are good bowel sounds.  EXTREMITIES:  Warm.  There is no clubbing, cyanosis or edema.  Her left  lower extremity is wrapped in a cast.  Distal pulses are 2+  bilaterally.  NEUROLOGIC:  She is alert and oriented x3.  Cranial nerves II-XII are  intact.  She has a bright affect.  She moves all four extremities without  significant difficulty, although her left leg is in a cast.   LAB WORK:  Sodium is 133, potassium 4.1, chloride 104, bicarb 25, BUN 14,  creatinine 0.7, glucose 131, magnesium 2.  Hemoglobin A1C is 8.8.  Cardiac  enzymes are negative x3.  TSH is normal at 4.6.   EKG shows normal sinus rhythm with frequent monomorphic PVCs.  There is a  question of right atrial enlargement and left atrial enlargement.  There is  no significant ST/T wave abnormality.  Her Q-T interval is borderline on  some of the EKGs, but on others it is normal.  It is not markedly prolonged.   ASSESSMENT/PLAN:  1.  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  There are no high risk      features.  I agree with treating with beta blocker, Toprol XL 50 mg a      day.  We will also follow up on her echocardiogram.  Should she develop      symptoms for nonsustained VT/VT, then more of a workup may be necessary.  2.  Exertional dyspnea on exertion and lower extremity edema:  I suspect      this is due to diastolic dysfunction.  I counseled her on the need to be      more active and control her blood pressure aggressively.  Would continue      p.r.n. Lasix.  Given her diabetes, would probably also benefit from      initiation of ACE inhibitor or ARB.      Brittany Silva, M.D. [facility]  Electronically Signed     DB/MEDQ  D:  10/09/2005  T:  10/09/2005  Job:  [id number]

## 2011-04-19 NOTE — Discharge Summary (Signed)
NAME:  Brittany Silva, Brittany Silva NO.:  [id number]   MEDICAL RECORD NO.:  [id number]          PATIENT TYPE:  INP   LOCATION:  5041                         FACILITY:  [facility]   PHYSICIAN:  Angeline Slim, M.D.  DATE OF BIRTH:  1956-05-13   DATE OF ADMISSION:  05/11/2006  DATE OF DISCHARGE:                                 DISCHARGE SUMMARY   Audio too short to transcribe (less than 5 seconds)      Angeline Slim, M.D.     AL/MEDQ  D:  05/12/2006  T:  05/12/2006  Job:  [id number]

## 2011-04-19 NOTE — Discharge Summary (Signed)
NAME:  Brittany, Silva NO.:  [id number]   MEDICAL RECORD NO.:  [id number]          PATIENT TYPE:  OIB   LOCATION:  9399                          FACILITY:  [facility]   PHYSICIAN:  Miguel Aschoff, M.D.       DATE OF BIRTH:  06/11/1956   DATE OF ADMISSION:  07/10/2007  DATE OF DISCHARGE:  07/11/2007                               DISCHARGE SUMMARY   ADMISSION DIAGNOSIS:  Adenomatous hyperplasia of endometrium.   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   OPERATIONS AND PROCEDURES:  Laparoscopically-assisted total vaginal  hysterectomy and bilateral salpingo-oophorectomy, general anesthesia.   BRIEF HISTORY:  The patient is a 55 year old white female who underwent  an endometrial biopsy in April 2008 for irregular vaginal bleeding.  The  pathology report revealed the presence of endometrial polyps with focal  simple and complex hyperplasia without atypia. Endometrial curettings at  that time also revealed simple hyperplasia but no evidence of carcinoma.  In view of these findings, the options of treatment discussed with the  patient, and the patient elected to undergo definitive therapy via  hysterectomy and bilateral salpingo-oophorectomy.  She was, therefore,  brought to the hospital to undergo laparoscopically-assisted total  vaginal hysterectomy and bilateral salpingo-oophorectomy after the  procedure was discussed with the patient and the risks and benefits also  outlined.   Preoperative studies were obtained.  This revealed admission hemoglobin  of 12.7, white count 7800.  PT and PTT were within normal limits.  Chemistry profile showed slight elevation of sugar at 114.  Other values  were within normal limits.   HOSPITAL COURSE:  The patient was taken to the operating room on July 10, 2007, and under general anesthesia, a laparoscopically-assisted total  vaginal hysterectomy and bilateral  salpingo-oophorectomy were performed  without difficulty.  The patient's postoperative course was essentially  uncomplicated.  She tolerated increasing ambulation and diet well.  She  had a drop in hemoglobin to 9.9, but this was not associated with any  symptoms.  After satisfactory postoperative course, she was discharged  home.   MEDICATIONS FOR HOME:  Included Tylox one every 3 to 4 hours as needed  for pain.   She was instructed no heavy lifting, place nothing in vagina and to call  for any problems such as fever, pain or heavy bleeding.   Final pathology report on the hysterectomy specimen showed proliferative  endometrium, endometrial polyps.  There was no evidence of malignancy  within the polyps.  The myometrium showed adenomyosis and leiomyomata.  The ovaries were benign and showed follicular cysts.   The patient was sent home on a regular diet in satisfactory condition  and told to return to the office in 4 weeks for followup examination.      Miguel Aschoff, M.D.  Electronically Signed     AR/MEDQ  D:  08/11/2007  T:  08/11/2007  Job:  [id number]

## 2011-04-19 NOTE — Op Note (Signed)
NAME:  Brittany Silva, Brittany Silva NO.:  [id number]   MEDICAL RECORD NO.:  [id number]          PATIENT TYPE:  INP   LOCATION:  5041                         FACILITY:  [facility]   PHYSICIAN:  Artist Pais. Weingold, M.D.DATE OF BIRTH:  [birth year]   DATE OF PROCEDURE:  05/11/2006  DATE OF DISCHARGE:                                 OPERATIVE REPORT   PREOPERATIVE DIAGNOSIS:  Right index finger flexion and extension sheath  infection.   POSTOPERATIVE DIAGNOSIS:  Right index finger flexion and extension sheath  infection.   PROCEDURE:  Repeat incision and drainage.   SURGEON:  Artist Pais. Mina Marble, M.D.   ASSISTANT:  None.   ANESTHESIA:  General.   TOURNIQUET TIME:  Thirty-five minutes.   COMPLICATIONS:  None.   DRAINS:  None.   OPERATION REPORT:  Patient was taken to the operating room with the  induction of adequate general anesthesia.  The right upper extremity was  prepped and draped in the usual sterile fashion.  Once this was done, the  iodoform packing that was placed in the wound 48 hours prior was removed.  The mid lateral incision on the radial side of the index finger was  debrided.  The neurovascular bundle was carefully identified and retracted.  The flexor sheath was opened again.  There was no purulence in the flexor  sheath.  There was some mild purulence over the extensor mechanism.  The  wound was thoroughly irrigated and debrided using 3 liters of normal saline  using a pulsatile lavage.  It was then loosely closed with 4-0 nylon.  Sterile dressing, Xeroform, 4x4s, and compressive wrap were applied.  The  patient tolerated the procedure well and was taken to the recovery room in a  stable fashion.      Artist Pais Mina Marble, M.D.  Electronically Signed     MAW/MEDQ  D:  05/13/2006  T:  05/13/2006  Job:  [id number]

## 2011-04-19 NOTE — Op Note (Signed)
NAME:  Brittany Silva, Brittany Silva NO.:  [id number]   MEDICAL RECORD NO.:  [id number]          PATIENT TYPE:  AMB   LOCATION:  SDC                           FACILITY:  [facility]   PHYSICIAN:  Miguel Aschoff, M.D.       DATE OF BIRTH:  [birth year]   DATE OF PROCEDURE:  03/27/2007  DATE OF DISCHARGE:                               OPERATIVE REPORT   PREOPERATIVE DIAGNOSIS:  Menometrorrhagia.   POSTOPERATIVE DIAGNOSIS:  Large endometrial polyps.   PROCEDURE:  Cervical dilatation, hysteroscopy, removal of polyps,  uterine curettage.   SURGEON:  Dr. Miguel Aschoff.   ANESTHESIA:  General.   COMPLICATIONS:  None.   JUSTIFICATION:  The patient is a 55 year old white female with history  of heavy irregular vaginal bleeding who presents now to undergo D&C,  hysteroscopy and possible endometrial ablation in an effort to diagnose  the etiology of her bleeding and control it.  The risks and benefits of  the procedure were discussed with the patient.  Informed consent has  been obtained.   PROCEDURE:  The patient was taken to the operating room and placed in  supine position.  General anesthesia was administered without  difficulty.  She was then placed in the dorsal lithotomy position,  prepped and draped in the usual sterile fashion.  Bladder was  catheterized.  Examination was then carried out which revealed normal  external genitalia, normal Bartholin and Skene's glands, normal urethra,  vaginal vault was without gross lesion.  The cervix was somewhat bulbous  but without gross lesion.  The uterus was normal size and shape.  The  adnexa revealed no masses.  At this point, speculum was placed in the  vaginal vault.  Anterior cervical lip was grasped with a tenaculum.  The  uterus was sounded 9.5 cm.  Cervical length of 4 cm was then  established.  The cervix was then dilated using serial Pratt dilators  until a #25 Pratt dilator could be passed without difficulty.  At this  point, the  diagnostic hysteroscope was advanced into the uterine cavity  and there appeared to be multiple polyps inside the cavity of the  uterus.  At this point, the hysteroscope was removed, polyp forceps were  introduced and several large polyps, one polyp approximately 4 cm x 1.5  cm which was removed without difficulty.  The hysteroscope was then  reintroduced and no other polypoid lesions were noted.  At this point,  vigorous sharp curettage was carried out with a medium-size serrated  curette.  The endometrial curettings were sent separately for histologic  study.  At this point, is was felt that a NovaSure ablation was not  indicated since there was a possibility that the patient may have  endometrial hyperplasia or neoplasia, so this portion of the procedure  was not carried out and plans to await pathology report before further  therapy is instituted.  At this point, the cervix was injected with 18  mL of 1% Xylocaine by placing 6 mL at the 12, 4, and 8 o'clock  positions.  All instruments were removed.  Hemostasis was  readily  achieved.  The patient reversed from the anesthetic and brought to the  recovery room in satisfactory condition.   Plan is for the patient to be discharged home.   MEDICATIONS FOR HOME:  1. Doxycycline 100 mg twice a day x3 days.  2. Ultram one every 6-8 hours as need for pain.   The patient to call for pathology report on April 01, 2007.  She is to  call for any problems such as fever, pain or heavy bleeding.  The  patient is to resume all her outpatient medications.      Miguel Aschoff, M.D.  Electronically Signed     AR/MEDQ  D:  03/27/2007  T:  03/27/2007  Job:  [id number]

## 2011-04-19 NOTE — Op Note (Signed)
NAME:  Brittany Silva, Brittany Silva NO.:  [id number]   MEDICAL RECORD NO.:  [id number]          PATIENT TYPE:  OIB   LOCATION:  0098                         FACILITY:  [facility]   PHYSICIAN:  Georges Lynch. Gioffre, M.D.DATE OF BIRTH:  11/09/1956   DATE OF PROCEDURE:  10/08/2005  DATE OF DISCHARGE:                                 OPERATIVE REPORT   SURGEON:  Georges Lynch. Darrelyn Hillock, M.D.   ASSISTANT:  Jamelle Rushing, P.A.   PREOPERATIVE DIAGNOSES:  1.  Trimalleolar fracture, closed left ankle.  2.  Diabetes mellitus.   POSTOPERATIVE DIAGNOSES:  1.  Trimalleolar fracture, closed left ankle.  2.  Diabetes mellitus.   OPERATIONS:  Open reduction internal fixation of the trimalleolar fracture  left ankle.   PROCEDURE:  Under general anesthesia, routine orthopedic prep and draping of  the left lower extremity carried out. She had 1 gram of IV Ancef preop. At  this time, the leg was exsanguinated with Esmarch and the tourniquet was  elevated to 300 mmHg. Following that, an incision was made over the lateral  aspect of the fibula, bleeders identified and cauterized. Great care was  taken not to isolate the sensory nerve and gently retracting it out of harms  way. I went down and identified the fracture site that was in multiple  fracture fragments. In regards of the fibula, we thoroughly irrigated it  out, debrided the clot, reduced the fracture and applied an 8 hole tubular  plate. We had to use the plate more or less as a buttress plate. We could  not get two good screws to purchase distally because of the type of fracture  that we had involved and it was so comminuted distally as well. We were able  to get a good plate on it and good fixation with the 3.5 mm cortical screws.  I then thoroughly irrigated out the area and closed the wound in layers in  the usual fashion. The skin was closed with metal staples. We then directed  attention to the medial side of the ankle, an incision was  made over the  medial malleolar region and great care was taken not to injure any of the  underlying neurovascular structures. I went down and identified the medial  malleolus. It was actually fragmented in two small pieces. There really was  not a decent size piece to put a screw in. What we did is we debrided out  the joint and the clot and removed the periosteum from the fracture site,  elevated it out of the way, reduced the fracture and put one pin across the  fracture site into the tibia. We then used cortical cancellus bone graft,  small particle graft and packed it into the fracture site because of the  comminution. We then took the periosteum and then resutured it back over the  fracture site. The posterior tibial tendon was intact. We then closed the  remaining part of the wound in  layers in the usual fashion. Sterile Neosporin dressings were applied and  the patient was placed in a short-leg cast with her ankle  inverted. We put a  well-padded cast on her and we will split the cast in the recovery room and  she will be started on the Coumadin protocol.           ______________________________  Georges Lynch. Darrelyn Hillock, M.D.     RAG/MEDQ  D:  10/08/2005  T:  10/08/2005  Job:  [id number]

## 2011-04-19 NOTE — Discharge Summary (Signed)
NAME:  Brittany Silva, WALPOLE NO.:  [id number]   MEDICAL RECORD NO.:  [id number]          PATIENT TYPE:  INP   LOCATION:  5041                         FACILITY:  [facility]   PHYSICIAN:  Angeline Slim, M.D.  DATE OF BIRTH:  03/31/1956   DATE OF ADMISSION:  05/11/2006  DATE OF DISCHARGE:  05/17/2006                                 DISCHARGE SUMMARY   DISCHARGE DIAGNOSES:  1.  Abscess/cellulitis of the right index finger.  2.  Diabetes mellitus, type II.  3.  Hypertension.  4.  Status post right foot fracture with ? osteoporosis.  5.  Hyperlipidemia.  6.  Positional leg swelling/edema.  7.  Anemia.   PROCEDURES:  I&D of the right index finger on May 11, 2006.   MEDICATIONS:  1.  Metformin 500 mg twice a day.  2.  Lantus 50 units.  3.  Cefuroxime 1 g IV daily.  4.  Lasix 40 mg as needed.  5.  Percocet 5/375 mg 1-2 tabs every four hours as needed.  6.  NovoLog insulin 3 units before meals.  7.  Lisinopril HCTZ 25/12-1/2 two tabs daily.   IMPORTANT LABORATORY RESULTS:  Hemoglobin A1c 10.2, LDL 107, wound culture  group A strep: Creatinine 0.7, hemoglobin 10.9, cholesterol 154, HDL 31,  triglycerides 78.   HOSPITAL COURSE:  55 year old white female with history of diabetes presents  with right hand abscess resilient to outpatient treatment with antibiotics.  Hand surgery was consulted and the patient was sent for I&D.  Infectious  Disease was also consulted.  The patient was placed on broad-spectrum  antibiotics until the culture grew out group A strep and the patient was  switched over to Cefuroxime.  The patient will be setup for 14 days on  Cefuroxime.  She was discharged home with home IV antibiotics.  She  eventually did get a PICC line placed.  In the hospital, her blood sugars  were managed with titration of her Lantus and addition of NovoLog.  The  patient will also go home on Metformin 500 twice daily.  Pain was well  controlled on Percocet.  Patient did  have some significant hypertension  during her hospital stay.  It seemed to be related to her anxiety and pain.  The patient did have lots of emotional distress related to her hospital  stay.  Most of the time her blood pressure was well controlled on Lisinopril  40/25 daily.  However, she did have some spiking blood pressures  periodically.  Her lipids were checked and she did have slightly high LDL at  107 above the goal of less than 100 for diabetics.  Her HDL was also low.   FOLLOW-UP:  Patient will follow-up with Dr. Ralene Ok, M.D. at the  __________ [facility] on May 22, 2006 at 3:30 p.m.   FOLLOW-UP ITEMS:  1.  Hypercholesterolemia:  Patient should probably started on a Statin in      the outpatient setting.  With her emotional stress situation, __________      extra medication would not help this situation.  2.  Diabetes mellitus:  Patient may be able to decrease her Lantus or her      NovoLog as Metformin starts to work.  It was started at the end of her      hospitalization, so it should be monitored closely.  3.  IV antibiotics:  Patient will have IV antibiotics for a total for 14      days until the 24th.  4.  Blood pressure:  Patient's blood pressure is to be monitored closely,      consider Holter in the outpatient setting.  The patient does continue to      spike periodically. When she is not in emotional distress, the      possibility of pheochromocytoma could be entertained.      Angeline Slim, M.D.     AL/MEDQ  D:  05/19/2006  T:  05/19/2006  Job:  [id number]   cc:   Lacretia Leigh. Ninetta Lights, M.D.  Fax: [phone]   Angeline Slim, M.D.  Fax: [phone]   Artist Pais. Mina Marble, M.D.  Fax: [phone]

## 2011-04-19 NOTE — Op Note (Signed)
NAME:  Brittany Silva, POSTEMA NO.:  [id number]   MEDICAL RECORD NO.:  [id number]          PATIENT TYPE:  INP   LOCATION:  5041                         FACILITY:  [facility]   PHYSICIAN:  Artist Pais. Weingold, M.D.DATE OF BIRTH:  01-06-1956   DATE OF PROCEDURE:  05/11/2006  DATE OF DISCHARGE:                                 OPERATIVE REPORT   PREOPERATIVE DIAGNOSIS:  Right index finger flexor sheath infection.   POSTOPERATIVE DIAGNOSIS:  Right index finger flexor sheath infection.   PROCEDURE:  Incision and drainage of above.   ASSISTANT:  None.   ANESTHESIA:  General.   TOURNIQUET TIME:  Thirty minutes.   COMPLICATIONS:  None.   DRAINS:  None.   Open packing of wound and cultures x2 sent.   OPERATION:  The patient was taken to the operating room with the induction  of adequate general anesthesia.  The right upper extremity was prepped and  draped in the usual sterile fashion.  An Esmarch was used to exsanguinate  the limb.  The tourniquet was inflated to 250 mmHg at this point in time.  A  mid lateral incision was made from the DIP joint to the MCP joint of the  index finger on the right site.  Dissection was carried down to above the  dorsal aspect of the extensor sheath and also the flexor sheath.  There was  significant purulence through both of these areas.  It was thoroughly  irrigated out using a 3 liter pulse lavage irrigation.  The wound was then  packed open with one-half inch Iodoform.  The patient was then placed in a  sterile dressing of 4x4 sloughs and Coban wrap.  The patient tolerated the  procedure well __________ fashion.  She also had this cultured for aerobic  and anaerobic bacteria.      Artist Pais Mina Marble, M.D.  Electronically Signed     MAW/MEDQ  D:  05/13/2006  T:  05/13/2006  Job:  [id number]

## 2011-04-19 NOTE — Consult Note (Signed)
NAME:  Brittany Silva, FULLMAN              ACCOUNT NO.:  [id number]   MEDICAL RECORD NO.:  [id number]          PATIENT TYPE:  AMB   LOCATION:  DAY                          FACILITY:  [facility]   PHYSICIAN:  Mobolaji B. Bakare, M.D.DATE OF BIRTH:  07-01-1956   DATE OF CONSULTATION:  DATE OF DISCHARGE:                                   CONSULTATION   PRIMARY CARE PHYSICIAN:  Stanley C. Andrey Campanile, MD   REFERRING PHYSICIAN:  Georges Lynch. Gioffre, MD.   REASON FOR REFERRAL:  Ectopic beats intraoperatively.   HISTORY OF PRESENT ILLNESS:  Brittany Silva is a 55 year old Caucasian female  with history of diabetes mellitus requiring insulin.  She had an episode of  hypoglycemia about a week ago whereby her blood glucose dropped to 26.  Apparently, she took her insulin prior to going to work without eating.  As  she got to work, she became hypoglycemic and fell sustaining a left ankle  fracture.  She has been using the same dose of insulin without any further  complication.  There was no change in her normal pattern prior to this  episode of hypoglycemia.   She came in today for open reduction and internal fixation of her left ankle  fracture, and during the procedure, she developed trigeminy and bigeminy  noted on telemetry.  Her blood pressure was normal during the whole  procedure.  The patient received 150 mg of lidocaine intraoperatively and  also 100 mg postoperatively.  Currently, she is going in and out of a  ventricular bigeminy.  When she goes into ventricular bigeminy, the heart  rate drops to 41 to 42, and when in sinus rhythm, heart rate is 76 to 80.  The patient denies any chest pain, no palpitations, and no dizziness.  She  has had 2 episodes of vomiting postoperatively.  Blood pressure is currently  elevated at 182/52, and she is saturating at 99% on 2 liters of oxygen.   Brittany Bissette stated that she does have history of ectopic beats for several  years.  She has not been symptomatic.  \   REVIEW OF SYSTEMS:  No fever, no chest pain, and no shortness of breath.  No  abdominal pain.  She does have nausea and vomiting.   PAST MEDICAL AND SURGICAL HISTORY:  1.  Diabetes mellitus.  2.  Right knee arthroscopy.  3.  Tubal ligation.  4.  Tooth extraction.   MEDICATIONS:  1.  Humulin N 80 units subcutaneous q.a.m.  If blood glucose is greater than      250, she takes additional Humulin R.  2.  Mepergan Fortis 1 to 2 q.4 h. p.r.n.  3.  Humulin R according to sliding scale.  4.  Aspirin 325 mg p.o. daily.  5.  Lasix 40 mg p.o. p.r.n.  6.  Tylenol p.r.n.   ALLERGIES:  1.  CODEINE GIVES EDEMA OF THE THROAT.  2.  SULFA GIVES HIVES.  3.  DARVOCET.   SOCIAL HISTORY:  The patient occasionally drinks alcohol.  She does not use  tobacco.   FAMILY HISTORY  Father died of MI  at the age of 48. Mother and some uncles have history of  ectopic beats   PHYSICAL EXAMINATION:  VITAL SIGNS:  Blood pressure 182/52.  Heart rate 80  beats per minute in normal sinus rhythm.  Saturation 99% on 2 liters.  Respiratory rate 12.  GENERAL:  On examination, she is awake, alert, and oriented to time, place,  and person. she is obese  HEENT:  Normocephalic and atraumatic.  The pupils equal, round, and reactive  to light.  Extraocular movements are intact.  NECK:  No carotid bruit.  No elevated JVD.  LUNGS:  Bibasilar rales.  No wheeze.  No rhonchi.  CARDIOVASCULAR:  S1 and S2, ectopic beats.  No gallop, no murmur.  ABDOMEN:  Distended, obese, soft, nontender.  No hepatosplenomegaly  palpable.  Bowel sounds present.  EXTREMITIES:  Right lower extremity right ankle has a bandaged dressing.  She can wiggle her toes.  Not cyanotic.  CENTRAL NERVOUS SYSTEM:  No focal neurologic deficits.   LABORATORY DATA:  Laboratory data obtained postoperatively shows sodium 153,  potassium 4.1, chloride 104, carbon dioxide 25, glucose 131, BUN 13,  creatinine 0.7, calcium 7.9, total protein 5.7, albumin 2.7, AST  16, ALT 11,  alkaline phosphatase 61, bilirubin 0.5.  CK 191, CK-MB 3.1 which was normal,  troponin 0.02, relative index of 5.6.  ECG done prior to procedure showed  normal sinus rhythm with R-wave pattern in V1 and V2 suggestive of  incomplete right bundle branch block.  Otherwise, there are no ectopic  beats.  She had a prolonged QT interval of 481.  ECG #2 done in PACU  postprocedure showed ventricular bigeminy.  ECG #3 done at 1654 post  lidocaine treatment showed normal sinus rhythm, heart rate of 75 beats per  minute, no ectopic beats, LVH by voltage criteria, QT/QTc 432/482.   ASSESSMENT/PLAN:  1.  Brittany Silva is a 55 year old Caucasian female with history of ectopic      beats and diabetes mellitus requiring insulin.  She developed      Ventricular bigeminy intraoperatively and postoperatively.  The patient      is asymptomatic.  The stress of surgery could potentially accentuates      the ectopic beats; however, we need to rule out myocardial infarction      and electrolyte imbalance.  We checked serial cardiac enzymes and      magnesium for electrolyte abnormalities.  Will also check TSH.  2.  History of hypoglycemia about a week ago.  The patient took insulin      without eating, and she has since then resumed her normal dose of      insulin.  I would not make any adjustments to current medications.  Will      check hemoglobin A1c.  3.  Diabetes mellitus.  Resume all medications when patient is taking p.o.  4.  Status post ORIF left ankle  5.  Obesity   Thank you for the consult.  We will follow up with you.      Mobolaji B. Corky Downs, M.D.  Electronically Signed     MBB/MEDQ  D:  10/08/2005  T:  10/08/2005  Job:  [id number]   cc:   [place]ale [place]. Andrey Campanile, M.D.  Fax: [phone]   Georges Lynch. Darrelyn Hillock, M.D.  Fax: [phone]

## 2011-06-25 ENCOUNTER — Other Ambulatory Visit: Payer: Self-pay | Admitting: Orthopedic Surgery

## 2011-06-25 DIAGNOSIS — R202 Paresthesia of skin: Secondary | ICD-10-CM

## 2011-06-25 DIAGNOSIS — R200 Anesthesia of skin: Secondary | ICD-10-CM

## 2011-06-25 DIAGNOSIS — M25511 Pain in right shoulder: Secondary | ICD-10-CM

## 2011-06-26 HISTORY — PX: EYE SURGERY: SHX253

## 2011-06-29 ENCOUNTER — Other Ambulatory Visit: Payer: 59

## 2011-09-13 LAB — URINALYSIS, ROUTINE W REFLEX MICROSCOPIC
Bilirubin Urine: NEGATIVE
Glucose, UA: >1000 — AB
Ketones, ur: NEGATIVE
Nitrite: NEGATIVE
Protein, ur: 30 — AB
Specific Gravity, Urine: 1.029
Urobilinogen, UA: 0.2
pH: 5.5

## 2011-09-13 LAB — URINE MICROSCOPIC-ADD ON

## 2011-09-13 LAB — APTT: aPTT: 32

## 2011-09-13 LAB — BASIC METABOLIC PANEL
BUN: 11
CO2: 28
Calcium: 9.1
Chloride: 102
Creatinine, Ser: 0.88
GFR calc Af Amer: >60
GFR calc non Af Amer: >60
Glucose, Bld: 222 — ABNORMAL HIGH
Potassium: 4.6
Sodium: 139

## 2011-09-13 LAB — PROTIME-INR
INR: 1
Prothrombin Time: 13.6

## 2011-09-13 LAB — HEMOGLOBIN AND HEMATOCRIT, BLOOD
HCT: 35.5 — ABNORMAL LOW
Hemoglobin: 12.2

## 2011-09-16 LAB — CBC
HCT: 29 — ABNORMAL LOW
HCT: 36.5
Hemoglobin: 9.9 — ABNORMAL LOW
Platelets: 203
RBC: 3.41 — ABNORMAL LOW
RDW: 14.5 — ABNORMAL HIGH
WBC: 7.8

## 2011-09-16 LAB — COMPREHENSIVE METABOLIC PANEL
AST: 22
Albumin: 3.8
Alkaline Phosphatase: 100
BUN: 21
Creatinine, Ser: 1.07
GFR calc Af Amer: >60
Potassium: 4.2
Total Protein: 7.1

## 2011-09-16 LAB — PROTIME-INR: INR: 0.9

## 2011-09-16 LAB — APTT: aPTT: 30

## 2012-02-08 ENCOUNTER — Encounter: Payer: Self-pay | Admitting: Family Medicine

## 2012-02-10 ENCOUNTER — Ambulatory Visit: Payer: Self-pay | Admitting: Family Medicine

## 2012-03-16 ENCOUNTER — Ambulatory Visit (INDEPENDENT_AMBULATORY_CARE_PROVIDER_SITE_OTHER): Payer: 59 | Admitting: Family Medicine

## 2012-03-16 ENCOUNTER — Encounter: Payer: Self-pay | Admitting: Family Medicine

## 2012-03-16 VITALS — BP 150/72 | HR 48 | Temp 97.0°F | Resp 16 | Ht 67.0 in | Wt 278.0 lb

## 2012-03-16 DIAGNOSIS — I10 Essential (primary) hypertension: Secondary | ICD-10-CM

## 2012-03-16 DIAGNOSIS — E039 Hypothyroidism, unspecified: Secondary | ICD-10-CM

## 2012-03-16 DIAGNOSIS — F32A Depression, unspecified: Secondary | ICD-10-CM

## 2012-03-16 DIAGNOSIS — H269 Unspecified cataract: Secondary | ICD-10-CM

## 2012-03-16 DIAGNOSIS — R5383 Other fatigue: Secondary | ICD-10-CM

## 2012-03-16 DIAGNOSIS — IMO0001 Reserved for inherently not codable concepts without codable children: Secondary | ICD-10-CM

## 2012-03-16 DIAGNOSIS — G609 Hereditary and idiopathic neuropathy, unspecified: Secondary | ICD-10-CM

## 2012-03-16 DIAGNOSIS — E785 Hyperlipidemia, unspecified: Secondary | ICD-10-CM

## 2012-03-16 DIAGNOSIS — R0681 Apnea, not elsewhere classified: Secondary | ICD-10-CM

## 2012-03-16 DIAGNOSIS — K219 Gastro-esophageal reflux disease without esophagitis: Secondary | ICD-10-CM

## 2012-03-16 DIAGNOSIS — F329 Major depressive disorder, single episode, unspecified: Secondary | ICD-10-CM

## 2012-03-16 DIAGNOSIS — N393 Stress incontinence (female) (male): Secondary | ICD-10-CM

## 2012-03-16 DIAGNOSIS — E109 Type 1 diabetes mellitus without complications: Secondary | ICD-10-CM

## 2012-03-16 DIAGNOSIS — M1990 Unspecified osteoarthritis, unspecified site: Secondary | ICD-10-CM

## 2012-03-16 DIAGNOSIS — Z8249 Family history of ischemic heart disease and other diseases of the circulatory system: Secondary | ICD-10-CM

## 2012-03-16 LAB — COMPREHENSIVE METABOLIC PANEL
AST: 19 U/L (ref 0–37)
Alkaline Phosphatase: 108 U/L (ref 39–117)
BUN: 21 mg/dL (ref 6–23)
Calcium: 9 mg/dL (ref 8.4–10.5)
Creat: 0.93 mg/dL (ref 0.50–1.10)
Total Bilirubin: 0.6 mg/dL (ref 0.3–1.2)

## 2012-03-16 LAB — LIPID PANEL
Cholesterol: 178 mg/dL (ref 0–200)
HDL: 34 mg/dL — ABNORMAL LOW (ref >39)
Total CHOL/HDL Ratio: 5.2 Ratio
Triglycerides: 168 mg/dL — ABNORMAL HIGH (ref <150)
VLDL: 34 mg/dL (ref 0–40)

## 2012-03-16 MED ORDER — SUCRALFATE 1 G PO TABS: 1.0000 g | ORAL_TABLET | Freq: Two times a day (BID) | ORAL | Status: DC

## 2012-03-16 MED ORDER — MELOXICAM 15 MG PO TABS: ORAL_TABLET | ORAL | Status: DC

## 2012-03-16 MED ORDER — DULOXETINE HCL 30 MG PO CPEP: ORAL_CAPSULE | ORAL | Status: DC

## 2012-03-16 NOTE — Progress Notes (Signed)
  Subjective:    Patient ID: Brittany Silva, female    DOB: 1956-02-27, 56 y.o.   MRN: [id number]  HPI Patient presents for routine follow up.  IDDM- fasting BS low 100 range             rash LE/feet secondary to new lotion however without ulceration             Cymbalta helping with neuropathic symptoms  Depression- stressors with husband and work                       Does not feel Cymbalta helping with mood symptoms; interested in going up on dose  HTN- missed medication doses  GERD/PND contributing to odynophagia; wakes up coughing with burning/sore throat. Currently on  Protonix and at times will double up.   Apnea- At time wakes up gasping for breath. No history of CAD, CHF or sleep apnea. Patient acknowledges to snoring and daytime somnolence  Denies chest pain  CAD risk factors- long standing diabetic, HTN, dyslipidemia, weight, FH.  Hypothyroid- out of sythroid for 1-2 weeks.  Was not able to tolerate higher does secondary to sweating and palpitations.   Review of Systems     Objective:   Physical Exam  Constitutional: She appears well-developed and well-nourished.  HENT:  Head: Normocephalic and atraumatic.  Right Ear: External ear normal.  Left Ear: External ear normal.  Nose: Nose normal.  Eyes: EOM are normal. Pupils are equal, round, and reactive to light.  Neck: Neck supple. No JVD present. Carotid bruit is not present. No thyromegaly present.  Cardiovascular: Normal rate, regular rhythm and normal heart sounds.        Very difficult to palpate distal pulses  Lymphadenopathy:    She has no cervical adenopathy.  Neurological: A sensory deficit is present.       Stocking neuropathy  Psychiatric: She has a normal mood and affect.      Results for orders placed in visit on 03/16/12  POCT GLYCOSYLATED HEMOGLOBIN (HGB A1C)      Component Value Range   Hemoglobin A1C 7.8         Assessment & Plan:   1. IDDM (insulin dependent diabetes mellitus); with  end organ damage to include neuropathy and retinopathy Lipid panel, Comprehensive metabolic panel, TSH, POCT glycosylated hemoglobin (Hb A1C), Ambulatory referral to Cardiology  2. HYPERLIPIDEMIA  Ambulatory referral to Cardiology  3. Morbid obesity    4. HYPERTENSION, BENIGN SYSTEMIC  Ambulatory referral to Cardiology  5. PERIPHERAL NEUROPATHY  DULoxetine (CYMBALTA) 30 MG capsule,   6. Hypothyroid    7. GERD (gastroesophageal reflux disease)  sucralfate (CARAFATE) 1 G tablet,  8. Depression  Cymbalta 90 mg daily  9. Fatigue    10. FH: CAD (coronary artery disease)    11. Apnea  Ambulatory referral for sleep study (Dr. Vickey Huger)  12. OA (osteoarthritis)  meloxicam (MOBIC) 15 MG tablet to use very sparingly  13. Cataracts, bilateral    14. SUI (stress urinary incontinence, female)     Regular activity; weight loss Improved adherence

## 2012-04-06 ENCOUNTER — Encounter: Payer: Self-pay | Admitting: Cardiology

## 2012-04-06 ENCOUNTER — Ambulatory Visit (INDEPENDENT_AMBULATORY_CARE_PROVIDER_SITE_OTHER): Payer: 59 | Admitting: Cardiology

## 2012-04-06 DIAGNOSIS — R079 Chest pain, unspecified: Secondary | ICD-10-CM | POA: Insufficient documentation

## 2012-04-06 DIAGNOSIS — E785 Hyperlipidemia, unspecified: Secondary | ICD-10-CM

## 2012-04-06 DIAGNOSIS — I10 Essential (primary) hypertension: Secondary | ICD-10-CM

## 2012-04-06 NOTE — Assessment & Plan Note (Signed)
Continue statin. 

## 2012-04-06 NOTE — Assessment & Plan Note (Signed)
Symptoms atypical. The burning sensation in her throat sounds to be reflux. She also has some pain in the right breast/upper quadrant area that may be gallbladder related. She will followup with her primary care physician for those issues. She does have some left sided chest pain. I am not convinced it is cardiac but she has significant risk factors including 20 years of diabetes mellitus. I will proceed with a Lexiscan Myoview for risk stratification. She cannot ambulate well because of previous ankle fracture. Add enteric-coated aspirin 81 mg daily.

## 2012-04-06 NOTE — Patient Instructions (Addendum)
Your physician recommends that you schedule a follow-up appointment in: AS NEEDED PENDING TEST RESULTS  Your physician has requested that you have a lexiscan myoview. For further information please visit [email or web address] Please follow instruction sheet, as given.    START ASPIRIN 81 MG ONCE DAILY WITH FOOD

## 2012-04-06 NOTE — Progress Notes (Signed)
HPI: 56 year old female for evaluation of hypertension. Echocardiogram in 2006 showed normal LV function, mild left atrial enlargement and mild mitral regurgitation. Laboratories in April of 2013 showed normal renal function, sodium 139, potassium 4.1, normal TSH. Patient states that for the past 2 months she has had occasional chest pain. It can occur under the left and right breast. It typically lasts 10-15 minutes. No associated symptoms. The pain under her right breast correlates with eating at times. Her pain is not pleuritic. She does not have exertional chest pain. She has some dyspnea with more moderate activities but not routine activities. No orthopnea or PND. She has chronic pedal edema worse on the left from previous ankle fracture. She does note that in the early a.m. hours she will occasionally have a burning sensation in her throat associated with water brash and significant dyspnea. She attributes this to reflux. Because of the above we were asked to evaluate.  Current Outpatient Prescriptions  Medication Sig Dispense Refill  . amLODipine (NORVASC) 5 MG tablet Take 5 mg by mouth daily.      . clonazePAM (KLONOPIN) 0.5 MG tablet Take 0.5 mg by mouth at bedtime as needed.      . DULoxetine (CYMBALTA) 30 MG capsule 3 po daily  270 capsule  0  . insulin NPH-insulin regular (NOVOLIN 70/30) (70-30) 100 UNIT/ML injection Inject 90 units into the skin daily with breakfast. Also, inject 70 units into the skin daily with supper.      . losartan-hydrochlorothiazide (HYZAAR) 100-25 MG per tablet Take 1 tablet by mouth daily.      . meloxicam (MOBIC) 15 MG tablet 1 daily as needed  90 tablet  1  . mometasone (NASONEX) 50 MCG/ACT nasal spray Place 2 sprays into the nose daily.      . pravastatin (PRAVACHOL) 80 MG tablet Take 80 mg by mouth daily.      . sucralfate (CARAFATE) 1 G tablet Take 1 tablet (1 g total) by mouth 2 (two) times daily.  180 tablet  1    Allergies  Allergen Reactions  .  Codeine Phosphate     REACTION: throat swelling  . Darvon Other (See Comments)    Jittering, skin problems  . Hydrocodone-Acetaminophen Itching    REACTION: unspecified, per pt throat itch and feel strange and also vomit  . Propoxyphene Hcl     REACTION: Makes her crazy  . Latex Rash  . Sulfamethoxazole Itching and Rash    Past Medical History  Diagnosis Date  . Diabetes mellitus   . Hypertension   . Hyperlipidemia   . Hypothyroid   . PVC's (premature ventricular contractions)   . GERD (gastroesophageal reflux disease)     Past Surgical History  Procedure Date  . Eye surgery 06/26/11    cataract removal Rt eye  . Abdominal hysterectomy   . Foot surgery     Ankle fracture  . Tonsillectomy   . Right knee surgery     History   Social History  . Marital Status: Married    Spouse Name: N/A    Number of Children: 2  . Years of Education: N/A   Occupational History  .  [employer]    Adiministrative   Social History Main Topics  . Smoking status: Never Smoker   . Smokeless tobacco: Not on file  . Alcohol Use: No  . Drug Use: Not on file  . Sexually Active: Not on file   Other Topics Concern  . Not  on file   Social History Narrative  . No narrative on file    Family History  Problem Relation Age of Onset  . Coronary artery disease Father     MI at age 66    ROS: Some arthralgias but no fevers or chills, productive cough, hemoptysis, dysphasia, odynophagia, melena, hematochezia, dysuria, hematuria, rash, seizure activity, orthopnea, PND, claudication. Remaining systems are negative.  Physical Exam:   Blood pressure 136/82, pulse 82, height 5\' 7"  (1.702 m), weight 126.554 kg (279 lb).  General:  Well developed/obese in NAD Skin warm/dry Patient not depressed No peripheral clubbing Back-normal HEENT-normal/normal eyelids Neck supple/normal carotid upstroke bilaterally; no bruits; no JVD; no thyromegaly chest - CTA/ normal expansion CV -  RRR/normal S1 and S2; no murmurs, rubs or gallops;  PMI nondisplaced Abdomen -NT/ND, no HSM, no mass, + bowel sounds, no bruit 2+ femoral pulses, no bruits Ext-trace edema on the right and 1+ on the left, no chords, 2+ DP on the right, left not palpated. Neuro-grossly nonfocal  ECG sinus rhythm at a rate of 82. Occasional PVCs. Normal axis. Left ventricular hypertrophy. Incomplete right bundle branch block. Cannot rule out prior anterior infarct.

## 2012-04-06 NOTE — Assessment & Plan Note (Signed)
Blood pressure controlled with present medications. Will continue. 

## 2012-04-08 ENCOUNTER — Telehealth: Payer: Self-pay | Admitting: *Deleted

## 2012-04-08 NOTE — Telephone Encounter (Signed)
Left message for patient to call and schedule myoview 

## 2012-04-14 ENCOUNTER — Encounter: Payer: Self-pay | Admitting: *Deleted

## 2012-04-20 ENCOUNTER — Telehealth: Payer: Self-pay

## 2012-04-20 DIAGNOSIS — G609 Hereditary and idiopathic neuropathy, unspecified: Secondary | ICD-10-CM

## 2012-04-20 NOTE — Telephone Encounter (Signed)
Pt receives meds thru [facility],provider increased dosage but she only received 90 pills,contacted [facility] and they requested prior authorization for change   Best phone for pt is [phone]

## 2012-04-22 NOTE — Telephone Encounter (Signed)
LMOM on cell and home #s to [facility] w/more details

## 2012-04-23 MED ORDER — DULOXETINE HCL 30 MG PO CPEP: ORAL_CAPSULE | ORAL | Status: DC

## 2012-04-23 NOTE — Telephone Encounter (Signed)
Prior auth done over the phone and received approval. Sending in 30 day supply #90 to local pharm. Notified pt that has been done

## 2012-04-23 NOTE — Telephone Encounter (Signed)
Pt CB and stated that when Dr Hal Hope increased her Cymbalta from 60 QD to three 30 mg QD last month and sent Rx to [facility], [facility] only shipped her #90 instead of #270 for the 3 mos. She is completely out and would like to have 30 day supply sent to [facility] [address]. Also, [facility] will send her out the additional 2 mos but a prior Berkley Harvey is needed first.

## 2012-04-30 ENCOUNTER — Other Ambulatory Visit: Payer: Self-pay | Admitting: *Deleted

## 2012-04-30 DIAGNOSIS — R079 Chest pain, unspecified: Secondary | ICD-10-CM

## 2012-05-19 ENCOUNTER — Ambulatory Visit (HOSPITAL_COMMUNITY): Payer: 59 | Attending: Cardiovascular Disease | Admitting: Radiology

## 2012-05-19 VITALS — BP 136/73 | Ht 68.0 in | Wt 272.0 lb

## 2012-05-19 DIAGNOSIS — R079 Chest pain, unspecified: Secondary | ICD-10-CM | POA: Insufficient documentation

## 2012-05-19 DIAGNOSIS — R0602 Shortness of breath: Secondary | ICD-10-CM

## 2012-05-19 MED ORDER — REGADENOSON 0.4 MG/5ML IV SOLN
0.4000 mg | Freq: Once | INTRAVENOUS | Status: AC
  Administered 2012-05-19: 0.4 mg via INTRAVENOUS

## 2012-05-19 MED ORDER — TECHNETIUM TC 99M TETROFOSMIN IV KIT
33.0000 | PACK | Freq: Once | INTRAVENOUS | Status: AC | PRN
  Administered 2012-05-19: 33 via INTRAVENOUS

## 2012-05-19 NOTE — Progress Notes (Signed)
[facility] SITE 3 NUCLEAR MED [address] [place] [address] [phone]  Cardiology Nuclear Med Study  Brittany Silva is a 56 y.o. female     MRN : [id number]     DOB: 07-02-1956  Procedure Date: 05/19/2012  Nuclear Med Background Indication for Stress Test:  Evaluation for Ischemia History:  2006 ECHO: NL 60%, 04/06/12 Abnormal EKG Cardiac Risk Factors: Family History - CAD, Hypertension, IDDM Type 2 and Lipids  Symptoms:  Chest Pain, DOE and Palpitations   Nuclear Pre-Procedure Caffeine/Decaff Intake:  None NPO After: 10:30pm   Lungs:  clear O2 Sat: 97% on room air. IV 0.9% NS with Angio Cath:  22g  IV Site: R Hand  IV Started by:  Stanton Kidney, EMT-P  Chest Size (in):  42 Cup Size: D  Height: 5\' 8"  (1.727 m)  Weight:  272 lb (123.378 kg)  BMI:  Body mass index is 41.36 kg/(m^2). Tech Comments:  CBG=95@ 7:15am, per patient.    Nuclear Med Study 1 or 2 day study: 2 day  Stress Test Type:  Treadmill/Lexiscan  Reading MD: Kelly Ranieri,M.D.  Order Authorizing Provider:  B.Crenshaw MD  Resting Radionuclide: Technetium 8m Tetrofosmin  Resting Radionuclide Dose: 33.0 mCi   On    05-21-12  Stress Radionuclide:  Technetium 69m Tetrofosmin  Stress Radionuclide Dose: 33.0 mCi  On       05-19-12          Stress Protocol Rest HR: 80 Stress HR: 117  Rest BP: 136/73 Stress BP: 149/60  Exercise Time (min): n/a METS: n/a   Predicted Max HR: 165 bpm % Max HR: 70.91 bpm Rate Pressure Product: 21308   Dose of Adenosine (mg):  n/a Dose of Lexiscan: 0.4 mg  Dose of Atropine (mg): n/a Dose of Dobutamine: n/a mcg/kg/min (at max HR)  Stress Test Technologist: Milana Na, EMT-P  Nuclear Technologist:  Doyne Keel, CNMT     Rest Procedure:  Myocardial perfusion imaging was performed at rest 45 minutes following the intravenous administration of Technetium 22m Tetrofosmin. Rest ECG: NSR - Normal EKG  Stress Procedure:  The patient received IV Lexiscan 0.4 mg  over 15-seconds with concurrent low level exercise and then Technetium 60m Tetrofosmin was injected at 30-seconds while the patient continued walking one more minute. There were no significant changes and sob with Lexiscan. Quantitative spect images were obtained after a 45-minute delay. Stress ECG: No significant change from baseline ECG  QPS Raw Data Images:  Normal; no motion artifact; normal heart/lung ratio. Stress Images:  Normal homogeneous uptake in all areas of the myocardium. Rest Images:  Normal homogeneous uptake in all areas of the myocardium. Subtraction (SDS):  There is no evidence of scar or ischemia. Transient Ischemic Dilatation (Normal <1.22):  1.01 Lung/Heart Ratio (Normal <0.45):  0.46  Quantitative Gated Spect Images QGS EDV: NA QGS ESV:  NA  Impression Exercise Capacity:  Lexiscan with no exercise. BP Response:  Normal blood pressure response. Clinical Symptoms:  Shortness of breath.  ECG Impression:  No significant ST segment change suggestive of ischemia. Comparison with Prior Nuclear Study: No images to compare  Overall Impression:  Normal stress nuclear study.  LV Ejection Fraction: Study not gated.   Brittany Silva [employer]

## 2012-05-21 ENCOUNTER — Ambulatory Visit (HOSPITAL_COMMUNITY): Payer: 59 | Attending: Cardiology

## 2012-05-21 DIAGNOSIS — R0989 Other specified symptoms and signs involving the circulatory and respiratory systems: Secondary | ICD-10-CM

## 2012-05-21 MED ORDER — TECHNETIUM TC 99M TETROFOSMIN IV KIT
33.0000 | PACK | Freq: Once | INTRAVENOUS | Status: AC | PRN
  Administered 2012-05-21: 33 via INTRAVENOUS

## 2012-06-15 ENCOUNTER — Ambulatory Visit: Payer: 59 | Admitting: Family Medicine

## 2012-06-19 ENCOUNTER — Ambulatory Visit (INDEPENDENT_AMBULATORY_CARE_PROVIDER_SITE_OTHER): Payer: 59 | Admitting: Family Medicine

## 2012-06-19 VITALS — BP 174/77 | HR 78 | Temp 97.1°F | Resp 18 | Ht 68.0 in | Wt 271.0 lb

## 2012-06-19 DIAGNOSIS — G609 Hereditary and idiopathic neuropathy, unspecified: Secondary | ICD-10-CM

## 2012-06-19 DIAGNOSIS — E559 Vitamin D deficiency, unspecified: Secondary | ICD-10-CM

## 2012-06-19 DIAGNOSIS — E079 Disorder of thyroid, unspecified: Secondary | ICD-10-CM

## 2012-06-19 DIAGNOSIS — I10 Essential (primary) hypertension: Secondary | ICD-10-CM

## 2012-06-19 DIAGNOSIS — G479 Sleep disorder, unspecified: Secondary | ICD-10-CM

## 2012-06-19 DIAGNOSIS — IMO0001 Reserved for inherently not codable concepts without codable children: Secondary | ICD-10-CM

## 2012-06-19 LAB — POCT GLYCOSYLATED HEMOGLOBIN (HGB A1C): Hemoglobin A1C: 10

## 2012-06-19 LAB — T4, FREE: Free T4: 1.06 ng/dL (ref 0.80–1.80)

## 2012-06-19 LAB — TSH: TSH: 3.11 u[IU]/mL (ref 0.350–4.500)

## 2012-06-19 MED ORDER — DULOXETINE HCL 30 MG PO CPEP: ORAL_CAPSULE | ORAL | Status: DC

## 2012-06-19 MED ORDER — PRAVASTATIN SODIUM 80 MG PO TABS: 80.0000 mg | ORAL_TABLET | Freq: Every day | ORAL | Status: DC

## 2012-06-19 MED ORDER — AMLODIPINE BESYLATE 5 MG PO TABS: 5.0000 mg | ORAL_TABLET | Freq: Every day | ORAL | Status: DC

## 2012-06-19 MED ORDER — INSULIN NPH (HUMAN) (ISOPHANE) 100 UNIT/ML ~~LOC~~ SUSP: SUBCUTANEOUS | Status: DC

## 2012-06-19 NOTE — Patient Instructions (Signed)

## 2012-06-20 LAB — VITAMIN D 25 HYDROXY (VIT D DEFICIENCY, FRACTURES): Vit D, 25-Hydroxy: 18 ng/mL — ABNORMAL LOW (ref 30–89)

## 2012-06-22 ENCOUNTER — Encounter: Payer: Self-pay | Admitting: Family Medicine

## 2012-06-22 ENCOUNTER — Other Ambulatory Visit: Payer: Self-pay | Admitting: Family Medicine

## 2012-06-22 MED ORDER — ERGOCALCIFEROL 1.25 MG (50000 UT) PO CAPS: 50000.0000 [IU] | ORAL_CAPSULE | ORAL | Status: DC

## 2012-06-22 NOTE — Progress Notes (Signed)
S:  Pt is a 56 y.o. Cauc female with Type II Diabetes- Insulin only. She also has HTN and is obese. She is compliant with medications and self-care most of the time. FSBS indicate inadequate control. She denies hypoglycemia.      She does not follow a meal plan and has not had nutritional counseling recently. Exercise is limited. She voices no symptoms such as CP, palpitations, HA, SOB, dizziness, lightheadedness, syncope, weakness. She does report having a              sleep study done which showed apnea but also a central disorder; she request referral to Dr. Vickey Huger who is the neurologist who interpreted her sleep study. She does have fatigue and difficulty with weight loss.   O: Vital signs reviewed  Pt is WN,WD and in NAD      HENT: [place]/AT; EOMI; conj/sclerae clear. Post pharynx narrow with mild erythema; no tonsillar hypertrophy noted      COR: RRR      LUNGS: Normal resp rate; clear to auscultation      NEURO: Nonfocal; DTRs difficult to illicit      A1C= 10.0%  A/P: 1. Type II or unspecified type diabetes mellitus without mention of complication, uncontrolled  Continue Humulin N 90 units subq every AM and 70 units subq every PM (RX faxed to [facility])  Emphasized importance of meal planning and staying active to improve metabolism  2. HTN (hypertension)  RF: Amlodipine 5 mg (RX faxed to [facility])  3. Thyroid disorder  TSH, T4, Free  4. Sleep disorder - per pt, Mixed Sleep Disorder Ambulatory Referral to Neurology Advised pt about how inadequate sleep affects overall metabolism and cardiovascular health  5. Unspecified vitamin D deficiency  Vitamin D, 25-hydroxy  6. Peripheral Neuropathy DULoxetine (CYMBALTA) 30 MG capsule

## 2012-06-22 NOTE — Progress Notes (Signed)
Quick Note:  Please call pt and advise that the following labs are abnormal... Thyroid results are in normal range.   Vit D level is low. I am prescribing Vitamin D 50,000 IU ; take 1 capsule once a week. Goal is to get your level to about 50. This will take several months.  Copy to pt. ______

## 2012-06-28 ENCOUNTER — Telehealth: Payer: Self-pay

## 2012-06-28 NOTE — Telephone Encounter (Signed)
Pt states that she was prescribed humulin N but she just realized that that rx was not correct she states that she just uses humilin without the N. Pt would like to discuss this with someone. Best#[phone]

## 2012-06-29 MED ORDER — INSULIN NPH ISOPHANE & REGULAR (70-30) 100 UNIT/ML ~~LOC~~ SUSP: SUBCUTANEOUS | Status: DC

## 2012-06-29 NOTE — Telephone Encounter (Signed)
I reviewed the medications from 06/19/12 visit. Pt's medication list had Novolin 70/30 on it; pt stated and I made sure to clarify that she was using Humulin N which she said was correct.  I spoke with pt this PM and she has received the 12-month  supply of Insulin- it is Humulin N which is not correct. She needs Humulin 70/30; I advised her to contact [facility] to see if she could return the shipment since she has not opened any boxes/vials.   Staff will fax a corrected RX: Humulin 70/30 74-month supply to [facility] tomorrow.  Also problem with Cymbalta; [facility] requires prior approval for increased quantity= 270 (85-month supply) 30 mg capsules. Pt takes 3 capsules daily.  Will address this tomorrow (06/30/12) during clinic hours.

## 2012-07-01 ENCOUNTER — Other Ambulatory Visit: Payer: Self-pay | Admitting: Family Medicine

## 2012-07-01 DIAGNOSIS — K219 Gastro-esophageal reflux disease without esophagitis: Secondary | ICD-10-CM

## 2012-07-01 DIAGNOSIS — G609 Hereditary and idiopathic neuropathy, unspecified: Secondary | ICD-10-CM

## 2012-07-01 MED ORDER — DULOXETINE HCL 30 MG PO CPEP: ORAL_CAPSULE | ORAL | Status: DC

## 2012-07-08 ENCOUNTER — Other Ambulatory Visit: Payer: Self-pay | Admitting: *Deleted

## 2012-07-08 ENCOUNTER — Telehealth: Payer: Self-pay

## 2012-07-08 MED ORDER — INSULIN NPH ISOPHANE & REGULAR (70-30) 100 UNIT/ML ~~LOC~~ SUSP: SUBCUTANEOUS | Status: DC

## 2012-07-08 NOTE — Telephone Encounter (Signed)
Looks like Dr. Audria Nine sent the Rx to the [facility] instead of [facility], so she should just call there!

## 2012-07-08 NOTE — Telephone Encounter (Signed)
Pt is out of medication, she is expecting [facility] to deliver, it has not come.  Please send to [facility] [address]  nsulin NPH-insulin regular (HUMULIN 70/30) (70-30) 100 UNIT/ML injection  [phone] (W) Best Number today

## 2012-07-08 NOTE — Telephone Encounter (Signed)
Called pt she is advised

## 2012-07-29 ENCOUNTER — Encounter: Payer: Self-pay | Admitting: Physician Assistant

## 2012-07-29 DIAGNOSIS — Z9289 Personal history of other medical treatment: Secondary | ICD-10-CM | POA: Insufficient documentation

## 2012-08-17 ENCOUNTER — Encounter: Payer: Self-pay | Admitting: *Deleted

## 2012-08-17 DIAGNOSIS — G479 Sleep disorder, unspecified: Secondary | ICD-10-CM

## 2012-08-21 ENCOUNTER — Ambulatory Visit (INDEPENDENT_AMBULATORY_CARE_PROVIDER_SITE_OTHER): Payer: 59 | Admitting: Family Medicine

## 2012-08-21 ENCOUNTER — Encounter: Payer: Self-pay | Admitting: Family Medicine

## 2012-08-21 VITALS — BP 132/64 | HR 96 | Temp 97.3°F | Resp 18 | Ht 67.25 in | Wt 263.4 lb

## 2012-08-21 DIAGNOSIS — E559 Vitamin D deficiency, unspecified: Secondary | ICD-10-CM

## 2012-08-21 DIAGNOSIS — IMO0001 Reserved for inherently not codable concepts without codable children: Secondary | ICD-10-CM

## 2012-08-21 DIAGNOSIS — J309 Allergic rhinitis, unspecified: Secondary | ICD-10-CM

## 2012-08-21 NOTE — Progress Notes (Signed)
S: This 56 y.o. Cauc female returns for DM follow-up. She changed her Insulin dose- continued 90 units in AM but decreased PM dose to 50 units because she was having mild hypoglycemia. Reports FSBS= 80-105, testing 2x daily on average.  Not following a meal plan nor getting regular exercise because of work-related stress (alot of changes at work that are negatively impacting her ability to eat regular meals and snacks).  She is working on weight loss but often feels fatigued.   Recent foot injury with resuiltant fracture has significantly restricted pt's activity level.   She also c/o hoarseness daily with phlegm; not using Nasonex daily and not using CPAP (she is still in ongoing f/u with that specialist).   ROS: Neg for significant appetite change, diaphoresis, fever/chills, cough/ rhinorrhea/sore throat, cardio/pulm/GI disturbances, HA/dizziness/numbness/ weakness or syncope.   O:  Filed Vitals:   08/21/12 0823  BP: 132/64                                         Weight down 8 lbs.  Pulse: 96  Temp: 97.3 F (36.3 C)  Resp: 18   GEN:In NAD; WN,WD. HENT: [place]/AT; EOMI, conj/scl clear. EACs/TMs normal without effusion. Post pharynx with mild erythema. Sinuses NT w/ percussion. NECK: No LAN or TMG. COR: RRR. LUNGS: CTA w/o wheezes EXT: FROM; Left ankle edema and deformity. NEURO: A&O x 3; CNs intact. Otherwise nonfocal.  Results for orders placed in visit on 08/21/12  POCT GLYCOSYLATED HEMOGLOBIN (HGB A1C)      Component Value Range   Hemoglobin A1C 10.9      A/P: 1. Type II or unspecified type diabetes mellitus without mention of complication, uncontrolled  Continue 90 units Insulin every AM and increase PM dose to 55- 60 units. Maximum effort to improve nutrition and plan healthier meals and snacks even at work. Try to minimize work-related stress. Will try to increase activity level once foot fracture heels; follow-up with ORTHO as scheduled.   2. Unspecified vitamin D  deficiency -pt took Vit D 50,000 IU 1 cap weekly for 1 month; she is advised that she has multiple refills and needs to continue this through the winter. Continue high-potency supplement.  3. Allergic rhinitis, cause unspecified  Resume Nasonex use daily

## 2012-08-21 NOTE — Patient Instructions (Addendum)
Allergic Rhinitis  Allergic rhinitis is when the mucous membranes in the nose respond to allergens. Allergens are particles in the air that cause your body to have an allergic reaction. This causes you to release allergic antibodies. Through a chain of events, these eventually cause you to release histamine into the blood stream (hence the use of antihistamines). Although meant to be protective to the body, it is this release that causes your discomfort, such as frequent sneezing, congestion and an itchy runny nose.    CAUSES    The pollen allergens may come from grasses, trees, and weeds. This is seasonal allergic rhinitis, or "hay fever." Other allergens cause year-round allergic rhinitis (perennial allergic rhinitis) such as house dust mite allergen, pet dander and mold spores.    SYMPTOMS     Nasal stuffiness (congestion).   Runny, itchy nose with sneezing and tearing of the eyes.   There is often an itching of the mouth, eyes and ears.  It cannot be cured, but it can be controlled with medications.  DIAGNOSIS    If you are unable to determine the offending allergen, skin or blood testing may find it.  TREATMENT     Avoid the allergen.   Medications and allergy shots (immunotherapy) can help.   Hay fever may often be treated with antihistamines in pill or nasal spray forms. Antihistamines block the effects of histamine. There are over-the-counter medicines that may help with nasal congestion and swelling around the eyes. Check with your caregiver before taking or giving this medicine.  If the treatment above does not work, there are many new medications your caregiver can prescribe. Stronger medications may be used if initial measures are ineffective. Desensitizing injections can be used if medications and avoidance fails. Desensitization is when a patient is given ongoing shots until the body becomes less sensitive to the allergen. Make sure you follow up with your caregiver if problems continue.  SEEK  MEDICAL CARE IF:     You develop fever (more than 100.5 F (38.1 C).   You develop a cough that does not stop easily (persistent).   You have shortness of breath.   You start wheezing.   Symptoms interfere with normal daily activities.  Document Released: 08/13/2001 Document Revised: 11/07/2011 Document Reviewed: 02/22/2009  ExitCare Patient Information 2012 [employer].

## 2012-09-22 ENCOUNTER — Other Ambulatory Visit: Payer: Self-pay

## 2012-09-22 DIAGNOSIS — M1990 Unspecified osteoarthritis, unspecified site: Secondary | ICD-10-CM

## 2012-09-22 MED ORDER — MELOXICAM 15 MG PO TABS: ORAL_TABLET | ORAL | Status: DC

## 2012-09-22 NOTE — Telephone Encounter (Signed)
PATIENT CALLED IN FOR AN RX REFILL AND TO SEE IF WE RECEIVED A FAX SENT YESTERDAY ON RX REQUEST WITH REFERENCE NUMBER OF [id number]. PLEASE CALL BACK IF THE RX REFILL FOR meloxicam (MOBIC) 15 MG tablet  THANK YOU!

## 2012-09-22 NOTE — Telephone Encounter (Signed)
Patient requests renewal of Meloxicam was faxed from [facility], please advise if renewal is appropriate. I sent fax back to them to have them send over an electronic request. Have pended Rx

## 2012-10-23 ENCOUNTER — Ambulatory Visit: Payer: 59 | Admitting: Family Medicine

## 2013-02-15 ENCOUNTER — Encounter: Payer: Self-pay | Admitting: Family Medicine

## 2013-03-04 ENCOUNTER — Encounter: Payer: Self-pay | Admitting: Family Medicine

## 2013-04-30 ENCOUNTER — Institutional Professional Consult (permissible substitution): Payer: Self-pay | Admitting: Neurology

## 2013-05-07 ENCOUNTER — Encounter: Payer: Self-pay | Admitting: Neurology

## 2013-05-07 ENCOUNTER — Ambulatory Visit (INDEPENDENT_AMBULATORY_CARE_PROVIDER_SITE_OTHER): Payer: 59 | Admitting: Neurology

## 2013-05-07 VITALS — BP 177/85 | HR 41 | Temp 97.0°F | Ht 66.5 in | Wt 281.0 lb

## 2013-05-07 DIAGNOSIS — E039 Hypothyroidism, unspecified: Secondary | ICD-10-CM

## 2013-05-07 DIAGNOSIS — G4733 Obstructive sleep apnea (adult) (pediatric): Secondary | ICD-10-CM | POA: Insufficient documentation

## 2013-05-07 NOTE — Progress Notes (Signed)
[facility]  Provider:  Dr Vickey Huger Referring Provider: Altheimer, Veverly Fells, MD Primary Care Physician:  Junious Silk, MD  Chief Complaint  Patient presents with  . New Evaluation    Altheimer, sleep consult,rm 10    HPI:  Brittany Silva is a 57 y.o. right handed  female , seen here as a referral from Dr. Leslie Dales .  She is to follow finally on her sleep study , had rescheduled or had been rescheduled for  multiple appointments since June 2013.    This patient was  originally referred by Dr. Nadyne Coombes , who directly ordered a split night sleep study in May 2013.  The patient at the time had a body mass index of 42.6, a neck circumference of 15.5 inches and an Epworth sleepiness score of 9 points,  as well as Becks inventory score of 18 points.  Need  for her sleep study based on  a past medical history of hypertension, morbid obesity, hyperlipidemia, diabetes mellitus, hypothyroidism, peripheral neuropathy, coronary artery disease and anxiety with depression.  Other sleep related  complaints were excessive daytime sleepiness as well as insomnia at night, daytime fatigue , achiness and soreness and that  her husband had witnessed her snoring and having apneas.   The patient's sleep study documented an AHI of 51.7 and an RDI of 53.5.  Interestingly the patient never entered REM sleep and she slept all night in the supine position. Her lowest oxygen saturation was at 77% with 24.2 minutes of desaturation time.  PLM arousal index was 1.4. As  the technician attempted to split the study by SPLIT protocol and convert into a CPAP titration,  he started her gently  at 5 cm CPAP and slowly advanced to 8 cm water pressure.  The patient responded poorly, the increase in pressure triggered frequent  central apneas .  The PAP was changed to a BiPAP , none of the settings tried lowered  the A H. I  to below thirty. The patient also had a significant amount of anxiety and  admits to claustrophobia -  A full titration did not give better results to her ( how she felt , subjectively - but numerically there  was a significant improvement in her AHI after the  titration study interpreted 06-06-12 with an adapt SV  Machine). Over the last 12 month , 4 appointments had  been made but the patient could not keep any of these followup appointments-  And she never received the  PAP machine as prescribed.  Patient does not think that as an adult she ever had good deep sleep or restorative sleep. 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  Until about 10 years ago she worked in her [employer] and sometimes late,   but she has not been a shift worker over a decade. He now works 8 AM -5 PM , sitting at a desk. Does not regularly exercise. Does not nap.  Has  caffeinated  soda or coffee in the morning before work. She has noted that caffeinated coffee taken during the daytime makes her sleepy, so she avoids that.   She endorses today the Epworth sleepiness score of 15 points the fatigue score at 46 points. 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 Her bedtime is from 10:30 PM to about 5 AM 6  AM in the morning. She estimates the total hours of sleep she gets at is maybe 4 hours total she is up and down a lot she wakes up spontaneously she's not aware if she shocks herself awake but sometimes she wakens up with a congested nose and a dry mouth. He does not have nocturia. Her BMI is unchanged over the 12 month  Time, 281 pounds.   Her first appointment with me in person ever after 4 attempts.  Review of Systems: Out of a complete 14 system review, the patient complains of only the following symptoms, and all other reviewed systems are negative. Obese, dry mouth . Sinusitis, rhinitis, allergies, fatigue and daytime sleepiness, snoring.   History    Social History  . Marital Status: Married    Spouse Name: N/A    Number of Children: 2  . Years of Education: 12   Occupational History  .  [employer]    Adiministrative   Social History Main Topics  . Smoking status: Never Smoker   . Smokeless tobacco: Not on file  . Alcohol Use: No  . Drug Use: No  . Sexually Active: Not on file   Other Topics Concern  . Not on file   Social History Narrative  . No narrative on file    Family History  Problem Relation Age of Onset  . Coronary artery disease Father     MI at age 46    Past Medical History  Diagnosis Date  . Diabetes mellitus   . Hypertension   . Hyperlipidemia   . Hypothyroid   . PVC's (premature ventricular contractions)   . GERD (gastroesophageal reflux disease)   . Obstructive apnea     Split study 05-22-12 - patient was too claustrophobic and did not persue CPAP.     Past Surgical History  Procedure Laterality Date  . Eye surgery  06/26/11    cataract removal Rt eye  . Abdominal hysterectomy    . Foot surgery      Ankle fracture  . Tonsillectomy    . Right knee surgery      Current Outpatient Prescriptions  Medication Sig Dispense Refill  . amLODipine (NORVASC) 5 MG tablet Take 1 tablet (5 mg total) by mouth daily.  90 tablet  3  . ERGOCALCIFEROL PO Take 5,000 Units by mouth once a week. One tablet once weekly      . HYDROmorphone (DILAUDID) 2 MG tablet Take 2 mg by mouth every 4 (four) hours as needed. OTC      . insulin NPH-insulin regular (HUMULIN 70/30) (70-30) 100 UNIT/ML injection Inject 90 units under skin every AM and 70 units under skin every PM with meals.  60 mL  3  . levothyroxine (SYNTHROID) 25 MCG tablet Take 25 mcg by mouth daily.      Marland Kitchen losartan-hydrochlorothiazide (HYZAAR) 100-25 MG per tablet Take 1 tablet by mouth daily.      . meloxicam (MOBIC) 15 MG tablet 1 daily as needed. Need office visit for additional refills.  30 tablet  0  . mometasone (NASONEX) 50 MCG/ACT nasal  spray Place 2 sprays into the nose daily. Only as needed      . losartan (COZAAR) 100 MG tablet       . sucralfate (CARAFATE) 1 G tablet Take 1 tablet (1 g total) by mouth 2 (two) times daily.  180 tablet  1   No current facility-administered medications for this visit.    Allergies as of  05/07/2013 - Review Complete 05/07/2013  Allergen Reaction Noted  . Adhesive (tape)  05/07/2013  . Codeine phosphate  06/17/2006  . Darvon Other (See Comments) 02/08/2012  . Darvon (propoxyphene hcl)  05/07/2013  . Hydrocodone-acetaminophen Itching 06/17/2006  . Propoxyphene hcl  06/17/2006  . Latex Rash 02/08/2012  . Sulfamethoxazole Itching and Rash 06/17/2006    Vitals: BP 177/85  Pulse 41  Temp(Src) 97 F (36.1 C) (Oral)  Ht 5' 6.5" (1.689 m)  Wt 281 lb (127.461 kg)  BMI 44.68 kg/m2 Last Weight:  Wt Readings from Last 1 Encounters:  05/07/13 281 lb (127.461 kg)   Last Height:   Ht Readings from Last 1 Encounters:  05/07/13 5' 6.5" (1.689 m)   Physical exam:  General: The patient is awake, alert and appears not in acute distress. The patient is well groomed. Head: Normocephalic, atraumatic. Neck is supple. Mallampati 3 -4 neck circumference:16.5 inches . Dry mouth, red tongue, uvula not visible, TMJ clicking noted.  No history of dentures or retainers.  Cardiovascular:  Regular rate and rhythm, without  murmurs or carotid bruit, and without distended neck veins. Respiratory: Lungs are clear to auscultation. Skin:  Ankle  edema,  Skin colour is changed , she has myalgia throughout the extremities and trunk . She has DDD and knee arthritis.  Trunk: BMI is elevated and patient  has normal posture.  Neurologic exam : The patient is awake and alert, oriented to place and time.  Memory subjective described as intact. There is a normal attention span & concentration ability. Speech is fluent without  dysarthria, dysphonia or aphasia. Mood and affect are appropriate, a little anxious.  .  Cranial nerves: Pupils are equal and briskly reactive to light. Funduscopic exam without evidence of pallor . Extraocular movements  in vertical and horizontal planes intact and without nystagmus. Visual fields by finger perimetry are intact. Hearing to finger rub intact.  Facial sensation intact to fine touch. Facial motor strength is symmetric and tongue and uvula move midline.  Motor exam:   Normal tone and normal muscle bulk and symmetric normal strength in all extremities.  Sensory:  Fine touch, pinprick and vibration were tested in all extremities. Proprioception is tested in the upper extremities only. This was  normal.  Coordination: Rapid alternating movements in the fingers/hands is tested and normal. Finger-to-nose maneuver tested and normal without evidence of ataxia, dysmetria or tremor.  Gait and station: Patient walks without assistive device , up to the exam table. Strength within normal limits. Stance is stable and normal.  Steps are un fragmented. Romberg testing is normal.  Deep tendon reflexes: in the  upper and lower extremities are symmetric and intact. Babinski maneuver response is  down going.   Assessment:  After physical and neurologic examination, review of laboratory studies, imaging, neurophysiology testing and pre-existing records, assessment will be reviewed on the problem list.  Plan:  Treatment plan and additional workup will be reviewed under Problem List.  I agree with Dr. Leslie Dales, this patient's apnea needs urgency to be treated. I suggested that he will give the patient a nasal pillow to take hold with out the machine to she can slowly desensitize at home - after about 14 days she will hopefully be able to endure a titration. I like patients to  Use a nasal pillow and nasal mask.  I would like her to come in for a full night titration in the 3rd week-  This prolonged approach  may help her to overcome  her claustrophobia and anxiety. i was  also willing  to give her a sleep aid for the first night on CPAP.  I wonder if her hypothyroidism and her severe leg edema are related,  the patient has a rather firm skin  ( indurated)  somewhat rigid muscle structure and diffuse myalgia.  She may benefit from a rheumatological consultation if thyroid disease is deemed not to be the cause. In addition I have dicussed with her exercise and diet approaches,   gave her information about how to reduce her BMI, and improve  sleep hygiene instructions . She received  medical information and discussion time about the risk factors for apnea as well as  Untreated sleep apnea being  a risk factor for other diseases.

## 2013-05-07 NOTE — Patient Instructions (Addendum)
 CPAP and BIPAP CPAP and BIPAP are methods of helping you breathe. CPAP stands for "continuous positive airway pressure." BIPAP stands for "bi-level positive airway pressure." Both CPAP and BIPAP are provided by a small machine with a flexible plastic tube that attaches to a plastic mask that goes over your nose or mouth. Air is blown into your air passages through your nose or mouth. This helps to keep your airways open and helps to keep you breathing well. The amount of pressure that is used to blow the air into your air passages can be set on the machine. The pressure setting is based on your needs. With CPAP, the amount of pressure stays the same while you breathe in and out. With BIPAP, the amount of pressure changes when you inhale and exhale. Your caregiver will recommend whether CPAP or BIPAP would be more helpful for you.  CPAP and BIPAP can be helpful for both adults and children with:  Sleep apnea.  Chronic Obstructive Pulmonary Disease (COPD), a condition like emphysema.  Diseases which weaken the muscles of the chest such as muscular dystrophy or neurological diseases.  Other problems that cause breathing to be weak or difficult. USE OF CPAP OR BIPAP The respiratory therapist or technician will help you get used to wearing the mask. Some people feel claustrophobic (a trapped or closed in feeling) at first, because the mask needs to be fairly snug on your face.   It may help you to get used to the mask gradually, by first holding the mask loosely over your nose or mouth using a low pressure setting on the machine. Gradually the mask can be applied more snugly with increased pressure. You can also gradually increase the amount of time the mask is used.  People with sleep apnea will use the mask and machine at night when they are sleeping. Others, like those with ALS or other breathing difficulties, may need the CPAP or BIPAP all the time.  If the first mask you try does not fit well,  or is uncomfortable, there are other types and sizes that can be tried.  If you tend to breathe through your mouth, a chin strap may be applied to help keep your mouth closed (if you are using a nasal mask).  The CPAP and BIPAP machines have alarms that may sound if the mask comes off or develops a leak.  You should not eat or drink while the CPAP or BIPAP is on. Food or fluids could get pushed into your lungs by the pressure of the CPAP or BIPAP. Sometimes CPAP or BIPAP machines are ordered for home use. If you are going to use the CPAP or BIPAP machine at home, follow these instructions  CPAP or BIPAP machines can be rented or purchased through home health care companies. There are many different brands of machines available. If you rent a machine before purchasing you may find which particular machine works well for you.  Ask questions if there is something you do not understand when picking out your machine.  Place your CPAP or BIPAP machine on a secure table or stand near an electrical outlet.  Know where the On/Off switch is.  Follow your doctor's instructions for how to set the pressure on your machine and when you should use it.  Do not smoke! Tobacco smoke residue can damage the machine. SEEK IMMEDIATE MEDICAL CARE IF:   You have redness or open areas around your nose or mouth.  You have trouble   operating the CPAP or BIPAP machine.  You cannot tolerate wearing the CPAP or BIPAP mask.  You have any questions or concerns. Document Released: 08/16/2004 Document Revised: 02/10/2012 Document Reviewed: 11/15/2008 ExitCare Patient Information 2014 [employer]. Sleep Apnea Sleep apnea is disorder that affects a person's sleep. A person with sleep apnea has abnormal pauses in their breathing when they sleep. It is hard for them to get a good sleep. This makes a person tired during the day. It also can lead to other physical problems. There are three types of sleep apnea. One type  is when breathing stops for a short time because your airway is blocked (obstructive sleep apnea). Another type is when the brain sometimes fails to give the normal signal to breathe to the muscles that control your breathing (central sleep apnea). The third type is a combination of the other two types. HOME CARE  Do not sleep on your back. Try to sleep on your side.  Take all medicine as told by your doctor.  Avoid alcohol, calming medicines (sedatives), and depressant drugs.  Try to lose weight if you are overweight. Talk to your doctor about a healthy weight goal. Your doctor may have you use a device that helps to open your airway. It can help you get the air that you need. It is called a positive airway pressure (PAP) device. There are three types of PAP devices:  Continuous positive airway pressure (CPAP) device.  Nasal expiratory positive airway pressure (EPAP) device.  Bilevel positive airway pressure (BPAP) device. MAKE SURE YOU:  Understand these instructions.  Will watch your condition.  Will get help right away if you are not doing well or get worse. Document Released: 08/27/2008 Document Revised: 11/04/2012 Document Reviewed: 03/21/2012 ExitCare Patient Information 2014 [employer]. Exercise to Lose Weight Exercise and a healthy diet may help you lose weight. Your doctor may suggest specific exercises. EXERCISE IDEAS AND TIPS  Choose low-cost things you enjoy doing, such as walking, bicycling, or exercising to workout videos.  Take stairs instead of the elevator.  Walk during your lunch break.  Park your car further away from work or school.  Go to a gym or an exercise class.  Start with 5 to 10 minutes of exercise each day. Build up to 30 minutes of exercise 4 to 6 days a week.  Wear shoes with good support and comfortable clothes.  Stretch before and after working out.  Work out until you breathe harder and your heart beats faster.  Drink extra water  when you exercise.  Do not do so much that you hurt yourself, feel dizzy, or get very short of breath. Exercises that burn about 150 calories:  Running 1  miles in 15 minutes.  Playing volleyball for 45 to 60 minutes.  Washing and waxing a car for 45 to 60 minutes.  Playing touch football for 45 minutes.  Walking 1  miles in 35 minutes.  Pushing a stroller 1  miles in 30 minutes.  Playing basketball for 30 minutes.  Raking leaves for 30 minutes.  Bicycling 5 miles in 30 minutes.  Walking 2 miles in 30 minutes.  Dancing for 30 minutes.  Shoveling snow for 15 minutes.  Swimming laps for 20 minutes.  Walking up stairs for 15 minutes.  Bicycling 4 miles in 15 minutes.  Gardening for 30 to 45 minutes.  Jumping rope for 15 minutes.  Washing windows or floors for 45 to 60 minutes. Document Released: 12/21/2010 Document Revised: 02/10/2012   Document Reviewed: 12/21/2010 ExitCare Patient Information 2014 [employer].  

## 2013-05-26 ENCOUNTER — Telehealth: Payer: Self-pay | Admitting: *Deleted

## 2013-05-26 NOTE — Telephone Encounter (Signed)
Message copied by Daryll Drown on Wed May 26, 2013 12:48 PM ------      Message from: [employer]      Created: Wed May 26, 2013 11:36 AM      Regarding: Desensitization       Suzzette Righter, I sent you a message on 6/13 that this patient would need to be contacted to set up desensitization.  Have you had a chance to do this yet?  I have to schedule her titration study but not until after she goes through the desensitization.  Let me know, thanks! ------

## 2013-05-26 NOTE — Telephone Encounter (Signed)
LM for pt at home - asked her to call me back in order to schedule a desensitization procedure.  Explained it would take 15-30 minutes and could be done tomorrow if she is available.  -sh

## 2013-06-02 NOTE — Telephone Encounter (Signed)
Patient returned call but I was unavailable.  I called patient back at work number and left my number and my email address.  Told her I would be happy to see her for desensitization, Monday may be the first available appointment.  Asked her to contact me and let me know what works for her.

## 2013-06-07 ENCOUNTER — Other Ambulatory Visit (INDEPENDENT_AMBULATORY_CARE_PROVIDER_SITE_OTHER): Payer: 59 | Admitting: *Deleted

## 2013-06-07 DIAGNOSIS — G4733 Obstructive sleep apnea (adult) (pediatric): Secondary | ICD-10-CM

## 2013-06-14 ENCOUNTER — Telehealth: Payer: Self-pay | Admitting: Neurology

## 2013-06-14 DIAGNOSIS — J329 Chronic sinusitis, unspecified: Secondary | ICD-10-CM

## 2013-06-14 NOTE — Telephone Encounter (Signed)
Hello Dr. Vickey Huger,   I have met with this patient twice now in an effort to try to desensitize her so she can start using ASV. She has several issues:   1. Anxiety (seriously pretty bad), we discussed dedicated sleep psychology referral as a possible step to helping her if she is not able to use the relaxation techniques etc we have done together. This is of course if you are in agreement. She needs air on her face and air flowing around her nose and she feels very tense and struggles quite a lot with relaxing.   2. She has chronic allergies that are becoming more severe, states she always has drainage down her throat, also contact allergies. During both sessions, she complained of ear pain and fullness (usually a sign of sinus issues that are aggravated by the air pressure), she also complained that the air got up to the bridge of her nose and then felt like it stopped. We really gave it our best shot but she couldn't breathe through her nose - we tried with FFM due to congestion, she experienced tingling lips (which she described as a familiar sensation when she is allergic - could be allergic to the silicone?) Also experienced hoarseness in her throat (sounded like she was getting a cold when she spoke) and said her nasal and oral airway felt smaller now after trying CPAP. She uses Nasonex already. I feel like she has no chance of being successful without at least consulting with an ENT to make sure she doesn't have structural obstruction or other serious issues that will make PAP unsuccessful. She needs help controlling the allergies at least - they are year round, she has a dog, they are worsening. I told her you usually refer to [facility]. She asked if you could possibly send a referral to them for her so she can get scheduled sooner. After her encounter there, she will get back in touch with [place] so we can keep trying to help her use ASV.

## 2013-06-14 NOTE — Telephone Encounter (Signed)
I called patient and left her a VM that we have made a referral to an ENT for her to address management of her allergy symptoms. You should hear from them shortly. Please call with any questions.

## 2013-06-24 ENCOUNTER — Ambulatory Visit: Payer: 59 | Admitting: Neurology

## 2013-06-24 ENCOUNTER — Encounter: Payer: Self-pay | Admitting: Neurology

## 2014-04-05 ENCOUNTER — Ambulatory Visit (INDEPENDENT_AMBULATORY_CARE_PROVIDER_SITE_OTHER): Payer: 59

## 2014-04-05 ENCOUNTER — Ambulatory Visit (INDEPENDENT_AMBULATORY_CARE_PROVIDER_SITE_OTHER): Payer: 59 | Admitting: Podiatry

## 2014-04-05 VITALS — BP 142/78 | HR 68 | Resp 16 | Ht 68.0 in | Wt 285.0 lb

## 2014-04-05 DIAGNOSIS — L02619 Cutaneous abscess of unspecified foot: Secondary | ICD-10-CM

## 2014-04-05 DIAGNOSIS — L03119 Cellulitis of unspecified part of limb: Secondary | ICD-10-CM

## 2014-04-05 DIAGNOSIS — R52 Pain, unspecified: Secondary | ICD-10-CM

## 2014-04-05 DIAGNOSIS — M2040 Other hammer toe(s) (acquired), unspecified foot: Secondary | ICD-10-CM

## 2014-04-05 DIAGNOSIS — E1069 Type 1 diabetes mellitus with other specified complication: Secondary | ICD-10-CM

## 2014-04-05 DIAGNOSIS — L03039 Cellulitis of unspecified toe: Secondary | ICD-10-CM

## 2014-04-05 MED ORDER — MUPIROCIN 2 % EX OINT: TOPICAL_OINTMENT | CUTANEOUS | Status: DC

## 2014-04-05 MED ORDER — AMOXICILLIN-POT CLAVULANATE 875-125 MG PO TABS: 1.0000 | ORAL_TABLET | Freq: Two times a day (BID) | ORAL | Status: DC

## 2014-04-05 NOTE — Progress Notes (Signed)
   Subjective:    Patient ID: Brittany Silva, female    DOB: January 29, 1956, 58 y.o.   MRN: [id number]  HPI Comments: Have a sore toe, on the left foot #4 . Its been going on since the beginning of the year on and off. It would heal up and come back. i am diabetic and the last a1c was 8.7  Diabetes Associated symptoms include fatigue.      Review of Systems  Constitutional: Positive for fatigue.  HENT: Positive for sneezing.        Sinus problems  Hearing loss   Respiratory: Positive for cough and shortness of breath.   Endocrine:       Diabetic   Musculoskeletal: Positive for back pain.       Joint pain  Muscle pain   Allergic/Immunologic: Positive for food allergies.  Hematological:       Slow to heal        Objective:   Physical Exam: I have reviewed her past medical history medications allergies surgeries social history and review of systems. Pulses are intact bilateral. Neurologic sensorium is intact per Semmes-Weinstein monofilament may be slightly reduced to the toes. Deep tendon reflexes are in non-elicitable. Muscle strength is 5 over 5 dorsiflexors plantar flexors inverters everters all intrinsic musculature is intact. 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 Radiographic evaluation does demonstrate the aforementioned hammertoe deformity does not demonstrate any type of osteomyelitis. Cutaneous evaluation does demonstrate erythema and edema to the fourth digit of the left foot with a diabetic ulceration associated to the distal lateral aspect of the fourth left toe. It does demonstrate some cellulitis to the fourth toe upon debridement of the reactive hyperkeratosis surrounding the ulcerative lesion I was unable to palpate bone at this point.        Assessment & Plan:  Assessment: Diabetic ulceration fourth digit left foot.  Plan: Debridement of the wound today placed her on Augmentin 875 mg 1 by mouth twice a day. She  will start soaking the toe twice daily. She will apply Bactroban ointment twice daily. She was also dispensed a Darco shoe. I will followup with her in 2 weeks for reevaluation

## 2014-04-21 ENCOUNTER — Encounter: Payer: Self-pay | Admitting: Podiatry

## 2014-04-21 ENCOUNTER — Ambulatory Visit (INDEPENDENT_AMBULATORY_CARE_PROVIDER_SITE_OTHER): Payer: 59 | Admitting: Podiatry

## 2014-04-21 VITALS — BP 132/68 | HR 68 | Resp 16

## 2014-04-21 DIAGNOSIS — L02619 Cutaneous abscess of unspecified foot: Secondary | ICD-10-CM

## 2014-04-21 DIAGNOSIS — M2040 Other hammer toe(s) (acquired), unspecified foot: Secondary | ICD-10-CM

## 2014-04-21 DIAGNOSIS — L03039 Cellulitis of unspecified toe: Principal | ICD-10-CM

## 2014-04-21 DIAGNOSIS — E1069 Type 1 diabetes mellitus with other specified complication: Secondary | ICD-10-CM

## 2014-04-21 NOTE — Progress Notes (Signed)
She presents today for followup of ulceration to the fourth digit of the left foot. She was utilized interdigital splint but because of its irritation she discontinued it. She simply nail soaks the foot washes it thoroughly applied medication and dressings that with a light dressing.  Objective: Vital signs are stable she is alert and oriented x3. 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 His rotation is resulting in superficial breakdown of the lateral portion of the the IPJ and now appears to be clinically healed. Because of the irritation she started on her second dose of Augmentin.  Assessment: Adductovarus rotated hammertoe deformity fourth left resulting in superficial ulceration and cellulitis which is resolving.  Plan: Discussed etiology pathology conservative versus surgical therapies at this point we will continue conservative therapies and to completely healed been at her earliest convenience we will perform a derotational arthroplasty or hammertoe repair with screw fourth digit left foot. She will watch for worsening symptoms of infection and notify us if there are any.

## 2014-05-12 ENCOUNTER — Encounter: Payer: Self-pay | Admitting: Podiatry

## 2014-05-12 ENCOUNTER — Ambulatory Visit (INDEPENDENT_AMBULATORY_CARE_PROVIDER_SITE_OTHER): Payer: 59

## 2014-05-12 ENCOUNTER — Ambulatory Visit (INDEPENDENT_AMBULATORY_CARE_PROVIDER_SITE_OTHER): Payer: 59 | Admitting: Podiatry

## 2014-05-12 DIAGNOSIS — L03039 Cellulitis of unspecified toe: Secondary | ICD-10-CM

## 2014-05-12 DIAGNOSIS — E1069 Type 1 diabetes mellitus with other specified complication: Secondary | ICD-10-CM

## 2014-05-12 DIAGNOSIS — L02619 Cutaneous abscess of unspecified foot: Secondary | ICD-10-CM

## 2014-05-12 DIAGNOSIS — L97509 Non-pressure chronic ulcer of other part of unspecified foot with unspecified severity: Secondary | ICD-10-CM

## 2014-05-12 MED ORDER — AMOXICILLIN-POT CLAVULANATE 875-125 MG PO TABS: 1.0000 | ORAL_TABLET | Freq: Two times a day (BID) | ORAL | Status: DC

## 2014-05-13 NOTE — Progress Notes (Signed)
She presents today for followup of ulceration to the distal lateral aspect fourth digit left foot. She states this is been swollen and red.  Objective: Vital signs are stable she is alert and oriented x3. Fourth toe demonstrates erythema and edema with superficial ulceration it probes deep but does not to bone. Radiographs today do not demonstrate any type of osteomyelitis.  Assessment: Hammertoe deformity resulting in cellulitis with ulceration.  Plan: We discussed the possible need for surgery today she's going to discuss this with her rheumatologist in order to stop her Remicade. I did dispense another prescription for antibiotic. I will followup with her in the near future for surgical consult. I think an arthroplasty would be the best degenerative changes are too much for a fusion.

## 2014-06-06 ENCOUNTER — Encounter: Payer: Self-pay | Admitting: Gastroenterology

## 2014-06-09 ENCOUNTER — Encounter: Payer: Self-pay | Admitting: Podiatry

## 2014-06-09 ENCOUNTER — Ambulatory Visit (INDEPENDENT_AMBULATORY_CARE_PROVIDER_SITE_OTHER): Payer: 59 | Admitting: Podiatry

## 2014-06-09 VITALS — BP 148/76 | HR 91 | Resp 16

## 2014-06-09 DIAGNOSIS — E1069 Type 1 diabetes mellitus with other specified complication: Secondary | ICD-10-CM

## 2014-06-09 DIAGNOSIS — L97509 Non-pressure chronic ulcer of other part of unspecified foot with unspecified severity: Secondary | ICD-10-CM

## 2014-06-09 DIAGNOSIS — L02619 Cutaneous abscess of unspecified foot: Secondary | ICD-10-CM

## 2014-06-09 DIAGNOSIS — L03039 Cellulitis of unspecified toe: Secondary | ICD-10-CM

## 2014-06-09 MED ORDER — AMOXICILLIN-POT CLAVULANATE 875-125 MG PO TABS
1.0000 | ORAL_TABLET | Freq: Two times a day (BID) | ORAL | Status: DC
Start: 2014-06-09 — End: 2014-08-04

## 2014-06-09 NOTE — Progress Notes (Signed)
She presents today for a chief complaint of a painful fourth digit of her left foot. She states it was doing good until as of late it has recently broke open again and started. She states it really don't want have an amputation secondary to my decrease immune status and diabetes. She states that if this wound will heal wash and we think that the surgical wound would heal.  Objective: Vital signs are stable she is alert and oriented x3. Pulses are barely palpable bilateral capillary fill time to digits one through 5 is immediate in feet are warm to the touch. 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 This ulceration does demonstrate some mild purulence and the skin was debrided today. This does not probe to bone currently.  Assessment: Diabetes with chronic ulceration peripheral vascular disease and immune suppression. Fourth toe left foot care rule out osteomyelitis.  Plan: Discussed etiology pathology conservative versus surgical therapies. Currently he prescribed conservative therapies and Augmentin 875 mg take one tablet by mouth twice daily x10 days. We discussed appropriate shoe gear and the need for surgery which she declined. I will followup with her in 2-4 weeks. She will call sooner if needed. We did discuss the consequences of not having surgery and the possible continuance of this cellulitis and osteomyelitis proceeding to sepsis kidney failure etc. she understands this and is amenable to it.

## 2014-06-30 ENCOUNTER — Ambulatory Visit: Payer: 59 | Admitting: Podiatry

## 2014-07-01 ENCOUNTER — Encounter: Payer: 59 | Admitting: Neurology

## 2014-07-19 ENCOUNTER — Ambulatory Visit: Payer: 59 | Admitting: Podiatry

## 2014-07-29 ENCOUNTER — Ambulatory Visit (INDEPENDENT_AMBULATORY_CARE_PROVIDER_SITE_OTHER): Payer: 59 | Admitting: Neurology

## 2014-07-29 ENCOUNTER — Ambulatory Visit (INDEPENDENT_AMBULATORY_CARE_PROVIDER_SITE_OTHER): Payer: Self-pay

## 2014-07-29 DIAGNOSIS — G5600 Carpal tunnel syndrome, unspecified upper limb: Secondary | ICD-10-CM

## 2014-07-29 DIAGNOSIS — G5621 Lesion of ulnar nerve, right upper limb: Secondary | ICD-10-CM

## 2014-07-29 DIAGNOSIS — G5603 Carpal tunnel syndrome, bilateral upper limbs: Secondary | ICD-10-CM

## 2014-07-29 DIAGNOSIS — E1342 Other specified diabetes mellitus with diabetic polyneuropathy: Secondary | ICD-10-CM

## 2014-07-29 DIAGNOSIS — Z0289 Encounter for other administrative examinations: Secondary | ICD-10-CM

## 2014-07-29 NOTE — Procedures (Signed)
HISTORY:  Brittany Silva is a 58 year old patient with a history of diabetes who reports a several year history of numbness in both hands, left greater than right. She does report some right shoulder discomfort and some neck discomfort. She is being evaluated for a possible neuropathy or a cervical radiculopathy.  NERVE CONDUCTION STUDIES:  Nerve conduction studies were performed on both upper extremities. The distal motor latencies for these were prolonged bilaterally, with normal motor amplitudes for these nerves. The distal motor latencies for the ulnar nerves were normal bilaterally, with normal motor amplitudes for these nerves bilaterally, but there is a drop-off in motor amplitudes for the ulnar nerve on the right across the elbow. The F wave latencies for the median nerves bilaterally and for the right ulnar nerve were prolonged, normal for the left ulnar nerve. The nerve conduction velocities for the median and ulnar nerves were normal bilaterally. The sensory latencies for the median nerves and for the ulnar nerves were prolonged bilaterally.  Nerve conduction studies were performed for the right lower extremity. The study of the right peroneal nerve was unobtainable. The distal motor latency for the right posterior tibial nerve was normal with a borderline normal motor amplitude. The right peroneal sensory latency was unobtainable.  EMG STUDIES:  EMG study was performed on the left upper extremity:  The first dorsal interosseous muscle reveals 2 to 5 K units with decreased recruitment. No fibrillations or positive waves were noted. The abductor pollicis brevis muscle reveals 2 to 5 K units with decreased recruitment. No fibrillations or positive waves were noted. The extensor indicis proprius muscle reveals 1 to 3 K units with full recruitment. No fibrillations or positive waves were noted. The pronator teres muscle reveals 2 to 3 K units with full recruitment. No fibrillations or  positive waves were noted. The biceps muscle reveals 1 to 2 K units with full recruitment. No fibrillations or positive waves were noted. The triceps muscle reveals 2 to 5 K units with full recruitment. No fibrillations or positive waves were noted. The anterior deltoid muscle reveals 2 to 3 K units with full recruitment. No fibrillations or positive waves were noted. The cervical paraspinal muscles were tested at 2 levels. No abnormalities of insertional activity were seen at either level tested. There was good relaxation.  EMG study was performed on the right upper extremity:  The first dorsal interosseous muscle reveals 2 to 6 K units with moderately decreased recruitment. One plus fibrillations and positive waves were noted. The abductor pollicis brevis muscle reveals 2 to 5 K units with decreased recruitment. No fibrillations or positive waves were noted. The extensor indicis proprius muscle reveals 1 to 3 K units with full recruitment. No fibrillations or positive waves were noted. The pronator teres muscle reveals 2 to 3 K units with full recruitment. No fibrillations or positive waves were noted.  The flexor digitorum profundus muscle (III-IV) reveals 2 to 4 K units with full treatment. No fibrillations or positive waves were noted. The biceps muscle reveals 1 to 2 K units with full recruitment. No fibrillations or positive waves were noted. The triceps muscle reveals 2 to 4 K units with full recruitment. No fibrillations or positive waves were noted. The anterior deltoid muscle reveals 2 to 3 K units with full recruitment. No fibrillations or positive waves were noted.   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 Nerve conduction studies of the upper extremities shows evidence of bilateral mild carpal tunnel syndrome.  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 No evidence of a cervical radiculopathy was seen on either side by EMG evaluation on the upper extremities.  Jill Alexanders MD 07/29/2014 1:53 PM  [facility] [address] [place], [place] [address]  Phone [phone] Fax [phone]

## 2014-08-04 ENCOUNTER — Ambulatory Visit (AMBULATORY_SURGERY_CENTER): Payer: Self-pay

## 2014-08-04 VITALS — Ht 68.0 in | Wt 83.6 lb

## 2014-08-04 DIAGNOSIS — Z1211 Encounter for screening for malignant neoplasm of colon: Secondary | ICD-10-CM

## 2014-08-04 MED ORDER — SUPREP BOWEL PREP KIT 17.5-3.13-1.6 GM/177ML PO SOLN: 1.0000 | Freq: Once | ORAL | Status: DC

## 2014-08-04 NOTE — Progress Notes (Signed)
No allergies to eggs or soy No past problems with anesthesia except PONV and "trouble waking me up"--had egd x2 without difficulty No diet/weight loss meds No home oxygen  Has email  Emmi instructions given for colonoscopy

## 2014-08-18 ENCOUNTER — Encounter: Payer: Self-pay | Admitting: Gastroenterology

## 2014-08-18 ENCOUNTER — Ambulatory Visit (AMBULATORY_SURGERY_CENTER): Payer: 59 | Admitting: Gastroenterology

## 2014-08-18 VITALS — BP 174/91 | HR 64 | Temp 97.6°F | Resp 15 | Ht 68.0 in | Wt 283.0 lb

## 2014-08-18 DIAGNOSIS — Z1211 Encounter for screening for malignant neoplasm of colon: Secondary | ICD-10-CM

## 2014-08-18 DIAGNOSIS — D126 Benign neoplasm of colon, unspecified: Secondary | ICD-10-CM

## 2014-08-18 DIAGNOSIS — D125 Benign neoplasm of sigmoid colon: Secondary | ICD-10-CM

## 2014-08-18 DIAGNOSIS — D124 Benign neoplasm of descending colon: Secondary | ICD-10-CM

## 2014-08-18 LAB — GLUCOSE, CAPILLARY
Glucose-Capillary: 234 mg/dL — ABNORMAL HIGH (ref 70–99)
Glucose-Capillary: 234 mg/dL — ABNORMAL HIGH (ref 70–99)

## 2014-08-18 MED ORDER — SODIUM CHLORIDE 0.9 % IV SOLN: 500.0000 mL | INTRAVENOUS | Status: DC

## 2014-08-18 NOTE — Progress Notes (Signed)
Called to room to assist during endoscopic procedure.  Patient ID and intended procedure confirmed with present staff. Received instructions for my participation in the procedure from the performing physician.  

## 2014-08-18 NOTE — Progress Notes (Signed)
Report to PACU, RN, vss, BBS= Clear.  

## 2014-08-18 NOTE — Op Note (Signed)
[facility]  [address] [place], [id number]   COLONOSCOPY PROCEDURE REPORT  PATIENT: Brittany Silva, Brittany Silva  MR#: [id number] BIRTHDATE: 11/18/1956 , 44  yrs. old GENDER: Female ENDOSCOPIST: Inda Castle, MD REFERRED IN:[facility], M.D. PROCEDURE DATE:  08/18/2014 PROCEDURE:   Colonoscopy with snare polypectomy First Screening Colonoscopy - Avg.  risk and is 50 yrs.  old or older Yes.  Prior Negative Screening - Now for repeat screening. N/A  History of Adenoma - Now for follow-up colonoscopy & has been > or = to 3 yrs.  N/A  Polyps Removed Today? Yes. ASA CLASS:   Class II INDICATIONS:average risk screening. MEDICATIONS: MAC sedation, administered by CRNA and propofol (Diprivan) 350mg  IV  DESCRIPTION OF PROCEDURE:   After the risks benefits and alternatives of the procedure were thoroughly explained, informed consent was obtained.  A digital rectal exam revealed no abnormalities of the rectum.   The LB [id number]  endoscope was introduced through the anus and advanced to the cecum, which was identified by both the appendix and ileocecal valve. No adverse events experienced.   The quality of the prep was Suprep good  The instrument was then slowly withdrawn as the colon was fully examined.      COLON FINDINGS: A sessile polyp measuring 7 mm in size was found in the descending colon.  A polypectomy was performed with a cold snare.  The resection was complete and the polyp tissue was completely retrieved.   A flat polyp measuring 3 mm in size was found in the sigmoid colon.  A polypectomy was performed using snare cautery.  The resection was complete and the polyp tissue was completely retrieved.   The colon was otherwise normal.  There was no diverticulosis, inflammation, polyps or cancers unless previously stated.  Retroflexed views revealed no abnormalities. The time to cecum=8 minutes 59 seconds.  Withdrawal time=11 minutes 41 seconds.  The  scope was withdrawn and the procedure completed. COMPLICATIONS: There were no complications.  ENDOSCOPIC IMPRESSION: 1.   Sessile polyp measuring 7 mm in size was found in the descending colon; polypectomy was performed with a cold snare 2.   Flat polyp measuring 3 mm in size was found in the sigmoid colon; polypectomy was performed using snare cautery 3.   The colon was otherwise normal  RECOMMENDATIONS: If the polyp(s) removed today are proven to be adenomatous (pre-cancerous) polyps, you will need a repeat colonoscopy in 5 years.  Otherwise you should continue to follow colorectal cancer screening guidelines for "routine risk" patients with colonoscopy in 10 years.  You will receive a letter within 1-2 weeks with the results of your biopsy as well as final recommendations.  Please call my office if you have not received a letter after 3 weeks.   eSigned:  Inda Castle, MD 08/18/2014 10:23 AM   cc:   PATIENT NAME:  Brittany Silva, Brittany Silva MR#: [id number]

## 2014-08-18 NOTE — Patient Instructions (Addendum)

## 2014-08-18 NOTE — Progress Notes (Signed)
No problems noted in the recovery room. maw 

## 2014-08-19 ENCOUNTER — Telehealth: Payer: Self-pay | Admitting: *Deleted

## 2014-08-19 NOTE — Telephone Encounter (Signed)
  Follow up Call-  Call back number 08/18/2014  Post procedure Call Back phone  # [phone]  Permission to leave phone message Yes     Patient questions:  Do you have a fever, pain , or abdominal swelling? No. Pain Score  0 *  Have you tolerated food without any problems? Yes.    Have you been able to return to your normal activities? Yes.    Do you have any questions about your discharge instructions: Diet   No. Medications  No. Follow up visit  No.  Do you have questions or concerns about your Care? No.  Actions: * If pain score is 4 or above:0 No action needed, pain <4.

## 2014-08-24 ENCOUNTER — Encounter: Payer: Self-pay | Admitting: Gastroenterology

## 2016-05-21 ENCOUNTER — Other Ambulatory Visit: Payer: Self-pay | Admitting: Family Medicine

## 2016-05-21 ENCOUNTER — Ambulatory Visit
Admission: RE | Admit: 2016-05-21 | Discharge: 2016-05-21 | Disposition: A | Discharge status: Home / Self Care | Payer: Self-pay | Source: Ambulatory Visit | Attending: Family Medicine | Admitting: Family Medicine

## 2016-05-21 DIAGNOSIS — R059 Cough, unspecified: Secondary | ICD-10-CM

## 2016-05-21 DIAGNOSIS — R05 Cough: Secondary | ICD-10-CM

## 2016-05-21 IMAGING — CR DG CHEST 2V
2 series · 2 of 2 positions shown · non-contrast
Comparison: Chest x-ray of [DATE]

CLINICAL DATA: Cough, shortness of breath with exertion over the
last 6 weeks

EXAM:
CHEST  2 VIEW

[w chest pa]
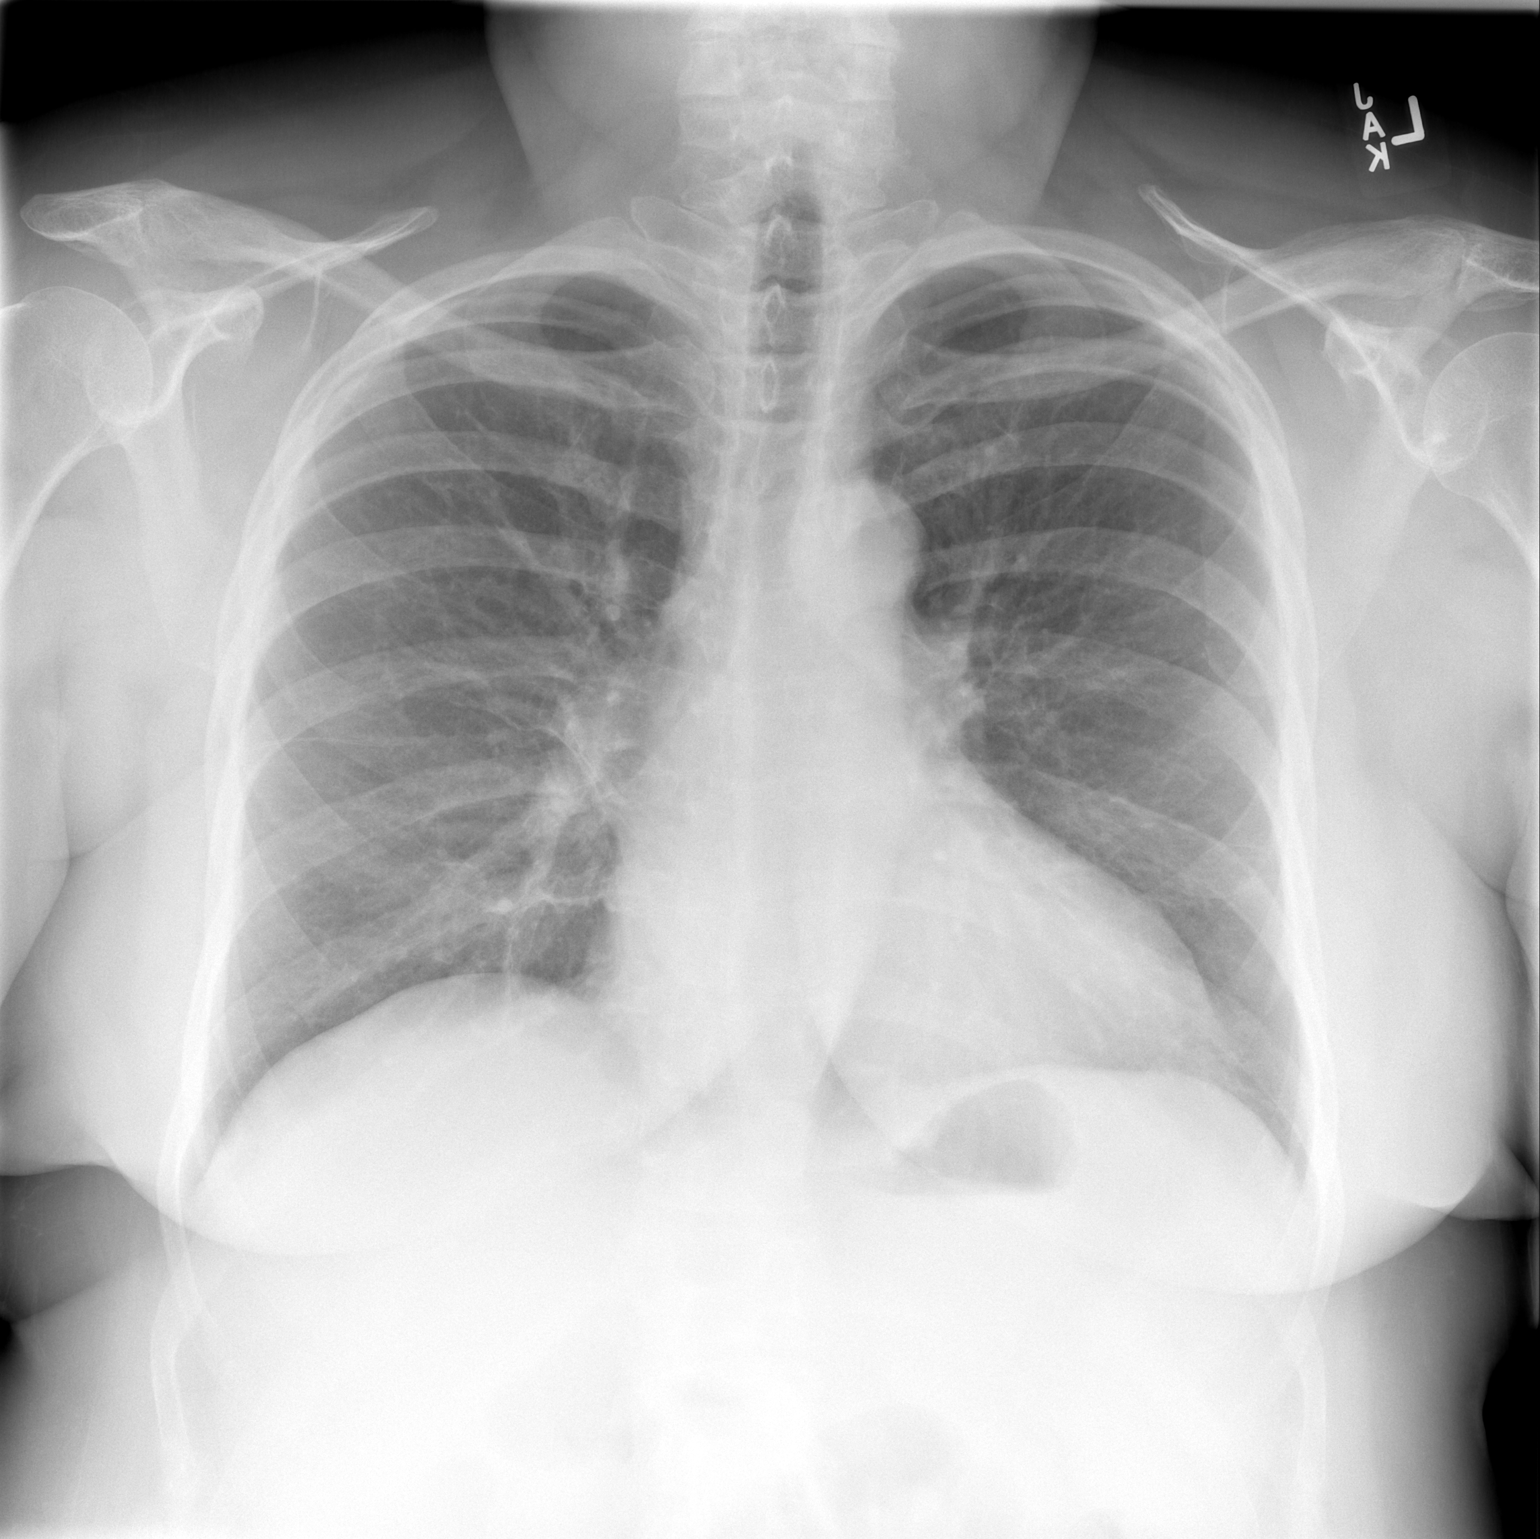

[w chest lat]
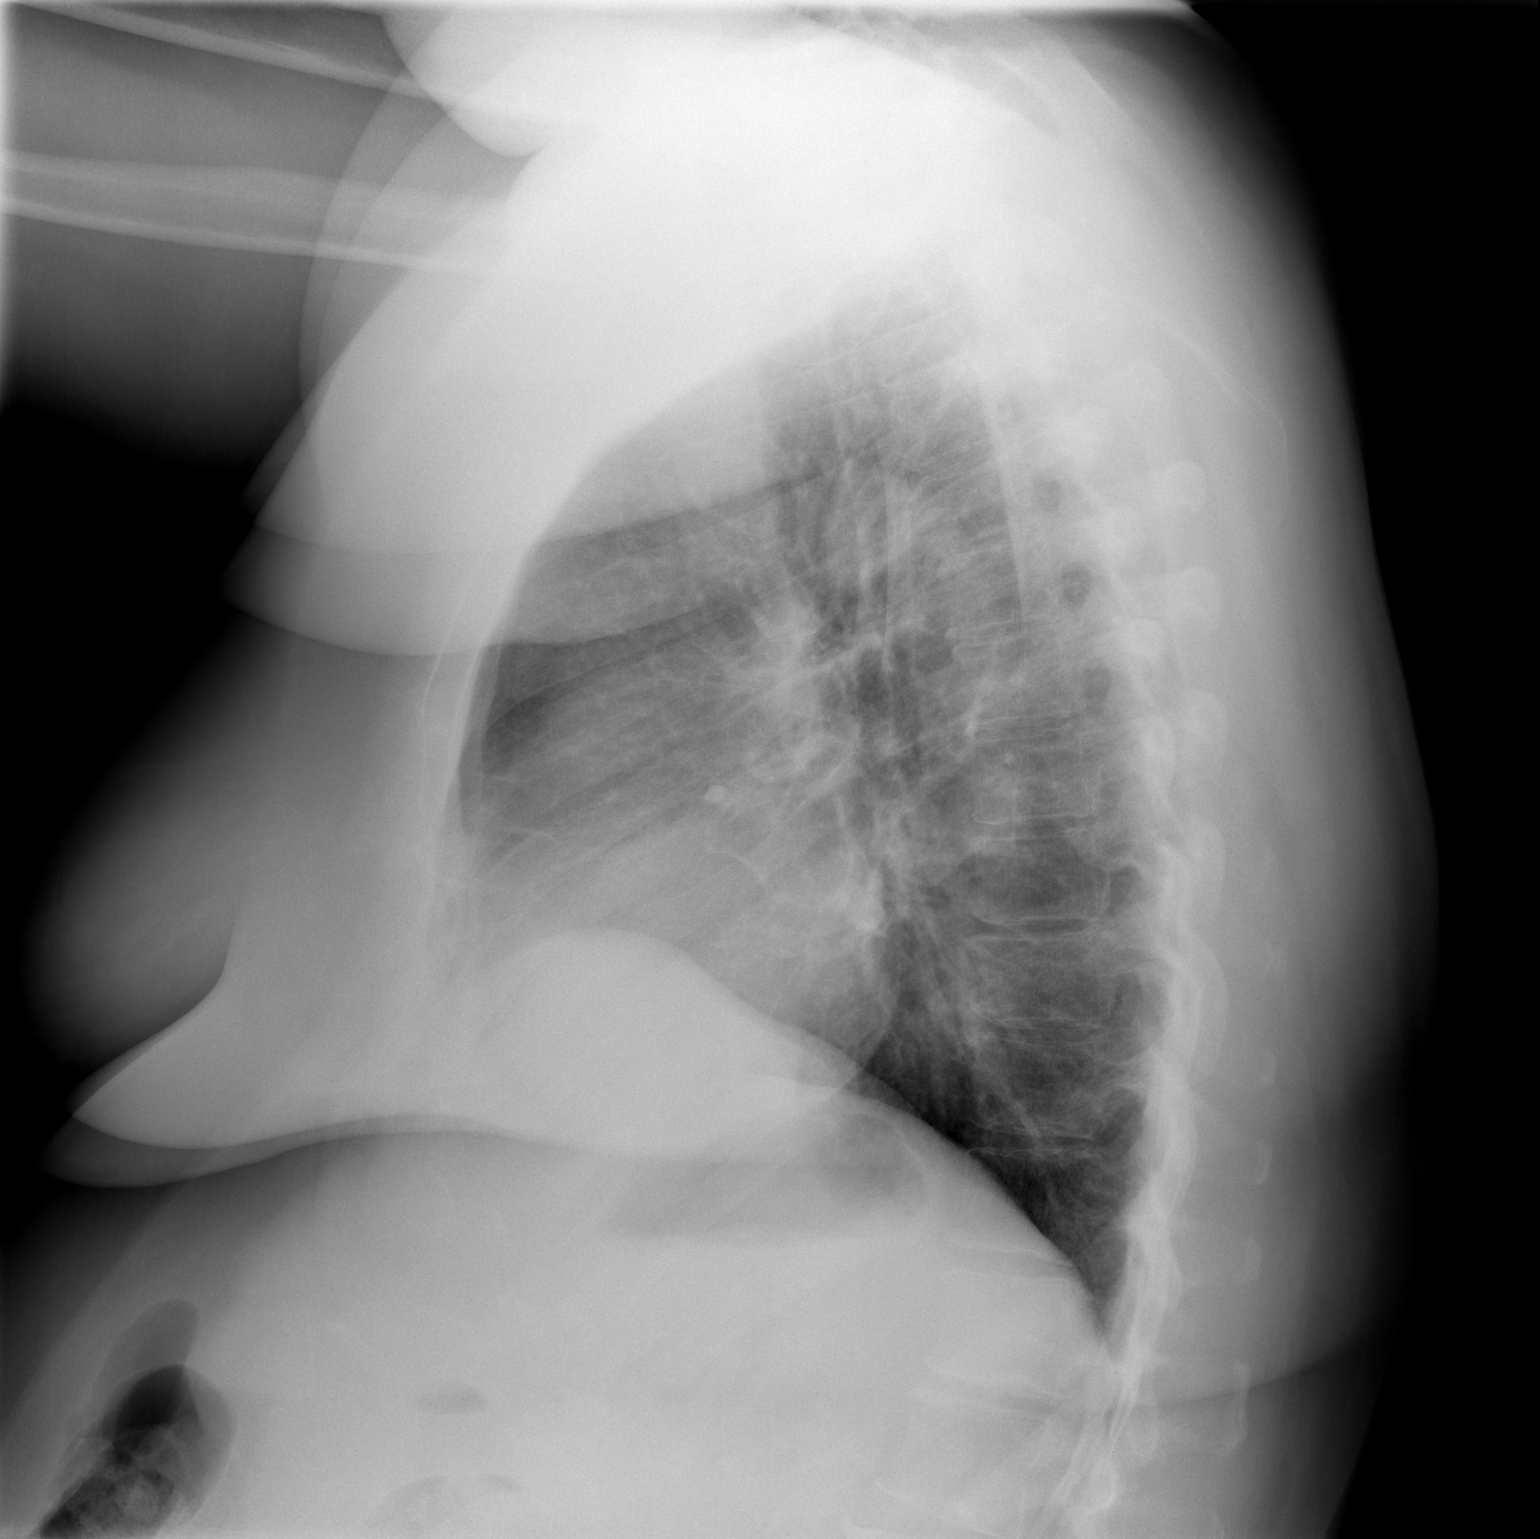

[2 of 2 positions shown; findings below may reference images not displayed]

FINDINGS: No active infiltrate or effusion is seen. There is minimal
peribronchial thickening present which can be seen with bronchitis.
Mediastinal and hilar contours otherwise are unremarkable. The heart
is within upper limits of normal. No bony abnormality is seen.
IMPRESSION: No pneumonia or effusion.  Question bronchitis.

## 2016-12-10 DIAGNOSIS — E1165 Type 2 diabetes mellitus with hyperglycemia: Secondary | ICD-10-CM | POA: Diagnosis not present

## 2016-12-10 DIAGNOSIS — I10 Essential (primary) hypertension: Secondary | ICD-10-CM | POA: Diagnosis not present

## 2016-12-10 DIAGNOSIS — E039 Hypothyroidism, unspecified: Secondary | ICD-10-CM | POA: Diagnosis not present

## 2017-01-30 DIAGNOSIS — I10 Essential (primary) hypertension: Secondary | ICD-10-CM | POA: Diagnosis not present

## 2017-01-30 DIAGNOSIS — E785 Hyperlipidemia, unspecified: Secondary | ICD-10-CM | POA: Diagnosis not present

## 2017-01-30 DIAGNOSIS — E1165 Type 2 diabetes mellitus with hyperglycemia: Secondary | ICD-10-CM | POA: Diagnosis not present

## 2017-03-12 DIAGNOSIS — J209 Acute bronchitis, unspecified: Secondary | ICD-10-CM | POA: Diagnosis not present

## 2017-04-14 DIAGNOSIS — Z961 Presence of intraocular lens: Secondary | ICD-10-CM | POA: Diagnosis not present

## 2017-04-14 DIAGNOSIS — H26492 Other secondary cataract, left eye: Secondary | ICD-10-CM | POA: Diagnosis not present

## 2017-04-14 DIAGNOSIS — E113393 Type 2 diabetes mellitus with moderate nonproliferative diabetic retinopathy without macular edema, bilateral: Secondary | ICD-10-CM | POA: Diagnosis not present

## 2017-04-14 DIAGNOSIS — H5213 Myopia, bilateral: Secondary | ICD-10-CM | POA: Diagnosis not present

## 2017-06-28 DIAGNOSIS — Z1231 Encounter for screening mammogram for malignant neoplasm of breast: Secondary | ICD-10-CM | POA: Diagnosis not present

## 2017-06-28 DIAGNOSIS — Z803 Family history of malignant neoplasm of breast: Secondary | ICD-10-CM | POA: Diagnosis not present

## 2017-07-10 DIAGNOSIS — E039 Hypothyroidism, unspecified: Secondary | ICD-10-CM | POA: Diagnosis not present

## 2017-07-10 DIAGNOSIS — E1165 Type 2 diabetes mellitus with hyperglycemia: Secondary | ICD-10-CM | POA: Diagnosis not present

## 2017-07-10 DIAGNOSIS — E7800 Pure hypercholesterolemia, unspecified: Secondary | ICD-10-CM | POA: Diagnosis not present

## 2017-07-15 DIAGNOSIS — I10 Essential (primary) hypertension: Secondary | ICD-10-CM | POA: Diagnosis not present

## 2017-07-15 DIAGNOSIS — E1065 Type 1 diabetes mellitus with hyperglycemia: Secondary | ICD-10-CM | POA: Diagnosis not present

## 2017-07-15 DIAGNOSIS — E039 Hypothyroidism, unspecified: Secondary | ICD-10-CM | POA: Diagnosis not present

## 2017-08-07 DIAGNOSIS — E785 Hyperlipidemia, unspecified: Secondary | ICD-10-CM | POA: Diagnosis not present

## 2017-08-07 DIAGNOSIS — Z23 Encounter for immunization: Secondary | ICD-10-CM | POA: Diagnosis not present

## 2017-08-07 DIAGNOSIS — E039 Hypothyroidism, unspecified: Secondary | ICD-10-CM | POA: Diagnosis not present

## 2017-08-07 DIAGNOSIS — I10 Essential (primary) hypertension: Secondary | ICD-10-CM | POA: Diagnosis not present

## 2017-09-17 ENCOUNTER — Other Ambulatory Visit: Payer: Self-pay | Admitting: *Deleted

## 2017-11-21 DIAGNOSIS — E108 Type 1 diabetes mellitus with unspecified complications: Secondary | ICD-10-CM | POA: Diagnosis not present

## 2017-11-21 DIAGNOSIS — E162 Hypoglycemia, unspecified: Secondary | ICD-10-CM | POA: Diagnosis not present

## 2017-12-04 DIAGNOSIS — E7800 Pure hypercholesterolemia, unspecified: Secondary | ICD-10-CM | POA: Diagnosis not present

## 2017-12-04 DIAGNOSIS — E1065 Type 1 diabetes mellitus with hyperglycemia: Secondary | ICD-10-CM | POA: Diagnosis not present

## 2017-12-04 DIAGNOSIS — E1165 Type 2 diabetes mellitus with hyperglycemia: Secondary | ICD-10-CM | POA: Diagnosis not present

## 2017-12-18 DIAGNOSIS — E1065 Type 1 diabetes mellitus with hyperglycemia: Secondary | ICD-10-CM | POA: Diagnosis not present

## 2017-12-18 DIAGNOSIS — E039 Hypothyroidism, unspecified: Secondary | ICD-10-CM | POA: Diagnosis not present

## 2017-12-25 DIAGNOSIS — E1065 Type 1 diabetes mellitus with hyperglycemia: Secondary | ICD-10-CM | POA: Diagnosis not present

## 2017-12-25 DIAGNOSIS — I10 Essential (primary) hypertension: Secondary | ICD-10-CM | POA: Diagnosis not present

## 2017-12-25 DIAGNOSIS — E039 Hypothyroidism, unspecified: Secondary | ICD-10-CM | POA: Diagnosis not present

## 2018-01-05 DIAGNOSIS — Z961 Presence of intraocular lens: Secondary | ICD-10-CM | POA: Diagnosis not present

## 2018-01-05 DIAGNOSIS — H5213 Myopia, bilateral: Secondary | ICD-10-CM | POA: Diagnosis not present

## 2018-01-05 DIAGNOSIS — H26493 Other secondary cataract, bilateral: Secondary | ICD-10-CM | POA: Diagnosis not present

## 2018-01-05 DIAGNOSIS — E113393 Type 2 diabetes mellitus with moderate nonproliferative diabetic retinopathy without macular edema, bilateral: Secondary | ICD-10-CM | POA: Diagnosis not present

## 2018-02-04 DIAGNOSIS — M79605 Pain in left leg: Secondary | ICD-10-CM | POA: Diagnosis not present

## 2018-02-04 DIAGNOSIS — E785 Hyperlipidemia, unspecified: Secondary | ICD-10-CM | POA: Diagnosis not present

## 2018-02-04 DIAGNOSIS — I10 Essential (primary) hypertension: Secondary | ICD-10-CM | POA: Diagnosis not present

## 2018-06-05 DIAGNOSIS — M14679 Charcot's joint, unspecified ankle and foot: Secondary | ICD-10-CM | POA: Diagnosis not present

## 2018-07-08 DIAGNOSIS — E1065 Type 1 diabetes mellitus with hyperglycemia: Secondary | ICD-10-CM | POA: Diagnosis not present

## 2018-07-08 DIAGNOSIS — E7800 Pure hypercholesterolemia, unspecified: Secondary | ICD-10-CM | POA: Diagnosis not present

## 2018-07-08 DIAGNOSIS — G609 Hereditary and idiopathic neuropathy, unspecified: Secondary | ICD-10-CM | POA: Diagnosis not present

## 2018-08-10 DIAGNOSIS — I10 Essential (primary) hypertension: Secondary | ICD-10-CM | POA: Diagnosis not present

## 2018-08-10 DIAGNOSIS — E785 Hyperlipidemia, unspecified: Secondary | ICD-10-CM | POA: Diagnosis not present

## 2018-08-10 DIAGNOSIS — Z23 Encounter for immunization: Secondary | ICD-10-CM | POA: Diagnosis not present

## 2018-09-21 DIAGNOSIS — I10 Essential (primary) hypertension: Secondary | ICD-10-CM | POA: Diagnosis not present

## 2018-09-30 DIAGNOSIS — H26493 Other secondary cataract, bilateral: Secondary | ICD-10-CM | POA: Diagnosis not present

## 2018-09-30 DIAGNOSIS — E113393 Type 2 diabetes mellitus with moderate nonproliferative diabetic retinopathy without macular edema, bilateral: Secondary | ICD-10-CM | POA: Diagnosis not present

## 2019-01-07 DIAGNOSIS — I10 Essential (primary) hypertension: Secondary | ICD-10-CM | POA: Diagnosis not present

## 2019-01-07 DIAGNOSIS — E039 Hypothyroidism, unspecified: Secondary | ICD-10-CM | POA: Diagnosis not present

## 2019-01-07 DIAGNOSIS — E1165 Type 2 diabetes mellitus with hyperglycemia: Secondary | ICD-10-CM | POA: Diagnosis not present

## 2019-01-07 DIAGNOSIS — E1065 Type 1 diabetes mellitus with hyperglycemia: Secondary | ICD-10-CM | POA: Diagnosis not present

## 2019-03-23 DIAGNOSIS — E039 Hypothyroidism, unspecified: Secondary | ICD-10-CM | POA: Diagnosis not present

## 2019-03-23 DIAGNOSIS — I10 Essential (primary) hypertension: Secondary | ICD-10-CM | POA: Diagnosis not present

## 2019-03-23 DIAGNOSIS — E785 Hyperlipidemia, unspecified: Secondary | ICD-10-CM | POA: Diagnosis not present

## 2020-09-08 ENCOUNTER — Emergency Department (HOSPITAL_BASED_OUTPATIENT_CLINIC_OR_DEPARTMENT_OTHER)
Admission: EM | Admit: 2020-09-08 | Discharge: 2020-09-09 | Disposition: A | Discharge status: Home / Self Care | Payer: 59 | Attending: Emergency Medicine | Admitting: Emergency Medicine

## 2020-09-08 ENCOUNTER — Other Ambulatory Visit: Payer: Self-pay

## 2020-09-08 ENCOUNTER — Encounter (HOSPITAL_BASED_OUTPATIENT_CLINIC_OR_DEPARTMENT_OTHER): Payer: Self-pay | Admitting: *Deleted

## 2020-09-08 ENCOUNTER — Emergency Department (HOSPITAL_BASED_OUTPATIENT_CLINIC_OR_DEPARTMENT_OTHER): Payer: 59

## 2020-09-08 DIAGNOSIS — Z79890 Hormone replacement therapy (postmenopausal): Secondary | ICD-10-CM | POA: Insufficient documentation

## 2020-09-08 DIAGNOSIS — N390 Urinary tract infection, site not specified: Secondary | ICD-10-CM | POA: Insufficient documentation

## 2020-09-08 DIAGNOSIS — E039 Hypothyroidism, unspecified: Secondary | ICD-10-CM | POA: Diagnosis not present

## 2020-09-08 DIAGNOSIS — Z91040 Latex allergy status: Secondary | ICD-10-CM | POA: Insufficient documentation

## 2020-09-08 DIAGNOSIS — I10 Essential (primary) hypertension: Secondary | ICD-10-CM | POA: Insufficient documentation

## 2020-09-08 DIAGNOSIS — Z79899 Other long term (current) drug therapy: Secondary | ICD-10-CM | POA: Diagnosis not present

## 2020-09-08 DIAGNOSIS — Z794 Long term (current) use of insulin: Secondary | ICD-10-CM | POA: Insufficient documentation

## 2020-09-08 DIAGNOSIS — E119 Type 2 diabetes mellitus without complications: Secondary | ICD-10-CM | POA: Diagnosis not present

## 2020-09-08 DIAGNOSIS — E860 Dehydration: Secondary | ICD-10-CM | POA: Diagnosis not present

## 2020-09-08 DIAGNOSIS — R112 Nausea with vomiting, unspecified: Secondary | ICD-10-CM | POA: Diagnosis present

## 2020-09-08 LAB — CBC WITH DIFFERENTIAL/PLATELET
Abs Immature Granulocytes: 0.05 10*3/uL (ref 0.00–0.07)
Basophils Absolute: 0 10*3/uL (ref 0.0–0.1)
Basophils Relative: 0 %
Eosinophils Absolute: 0.1 10*3/uL (ref 0.0–0.5)
Eosinophils Relative: 1 %
HCT: 40.9 % (ref 36.0–46.0)
Hemoglobin: 13.9 g/dL (ref 12.0–15.0)
Immature Granulocytes: 0 %
Lymphocytes Relative: 6 %
Lymphs Abs: 0.7 10*3/uL (ref 0.7–4.0)
MCH: 29 pg (ref 26.0–34.0)
MCHC: 34 g/dL (ref 30.0–36.0)
MCV: 85.4 fL (ref 80.0–100.0)
Monocytes Absolute: 0.8 10*3/uL (ref 0.1–1.0)
Monocytes Relative: 7 %
Neutro Abs: 10.3 10*3/uL — ABNORMAL HIGH (ref 1.7–7.7)
Neutrophils Relative %: 86 %
Platelets: 165 10*3/uL (ref 150–400)
RBC: 4.79 MIL/uL (ref 3.87–5.11)
RDW: 13.6 % (ref 11.5–15.5)
WBC: 12 10*3/uL — ABNORMAL HIGH (ref 4.0–10.5)
nRBC: 0 % (ref 0.0–0.2)

## 2020-09-08 LAB — COMPREHENSIVE METABOLIC PANEL
ALT: 20 U/L (ref 0–44)
AST: 34 U/L (ref 15–41)
Albumin: 3.3 g/dL — ABNORMAL LOW (ref 3.5–5.0)
Alkaline Phosphatase: 110 U/L (ref 38–126)
Anion gap: 14 (ref 5–15)
BUN: 27 mg/dL — ABNORMAL HIGH (ref 8–23)
CO2: 25 mmol/L (ref 22–32)
Calcium: 8.2 mg/dL — ABNORMAL LOW (ref 8.9–10.3)
Chloride: 91 mmol/L — ABNORMAL LOW (ref 98–111)
Creatinine, Ser: 1.08 mg/dL — ABNORMAL HIGH (ref 0.44–1.00)
GFR, Estimated: 54 mL/min — ABNORMAL LOW (ref >60)
Glucose, Bld: 208 mg/dL — ABNORMAL HIGH (ref 70–99)
Potassium: 4.1 mmol/L (ref 3.5–5.1)
Sodium: 130 mmol/L — ABNORMAL LOW (ref 135–145)
Total Bilirubin: 1.5 mg/dL — ABNORMAL HIGH (ref 0.3–1.2)
Total Protein: 7.2 g/dL (ref 6.5–8.1)

## 2020-09-08 LAB — URINALYSIS, ROUTINE W REFLEX MICROSCOPIC
Glucose, UA: >=500 mg/dL — AB
Ketones, ur: NEGATIVE mg/dL
Leukocytes,Ua: NEGATIVE
Nitrite: NEGATIVE
Protein, ur: 30 mg/dL — AB
Specific Gravity, Urine: 1.025 (ref 1.005–1.030)
pH: 5.5 (ref 5.0–8.0)

## 2020-09-08 LAB — CBG MONITORING, ED: Glucose-Capillary: 208 mg/dL — ABNORMAL HIGH (ref 70–99)

## 2020-09-08 LAB — URINALYSIS, MICROSCOPIC (REFLEX)

## 2020-09-08 LAB — LIPASE, BLOOD: Lipase: 24 U/L (ref 11–51)

## 2020-09-08 IMAGING — DX DG TOE 4TH 2+V*L*
3 series · 3 of 3 positions shown · non-contrast
Comparison: [DATE]

CLINICAL DATA: Wound to fourth toe

EXAM:
LEFT FOURTH TOE

[t-spine lat]
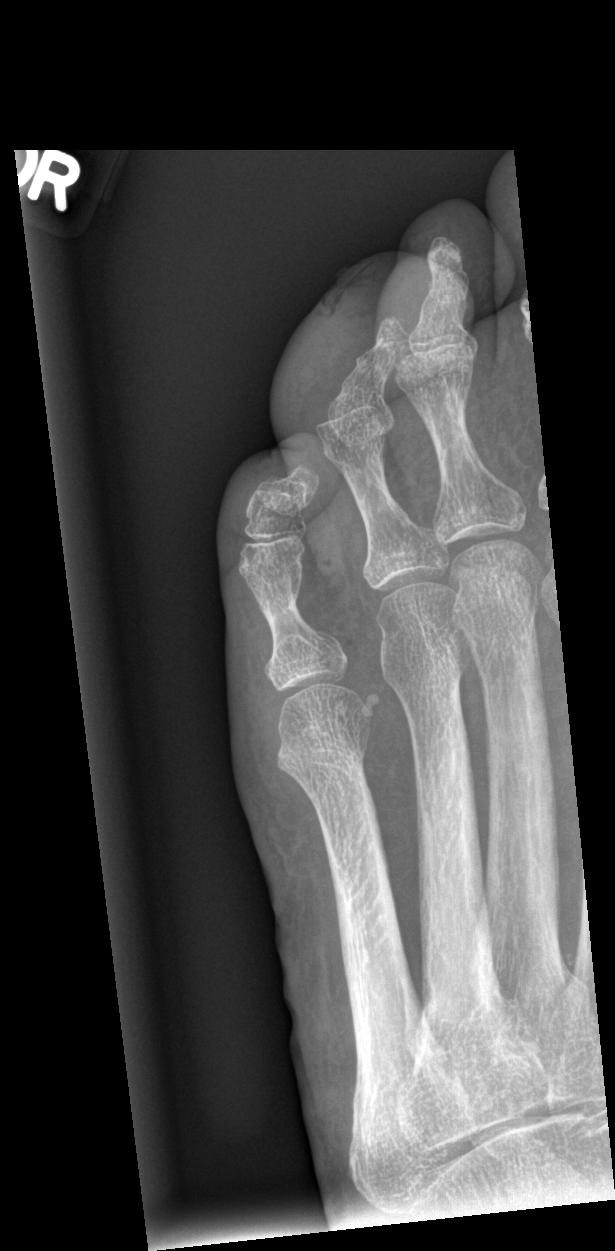

[t-spine obl]
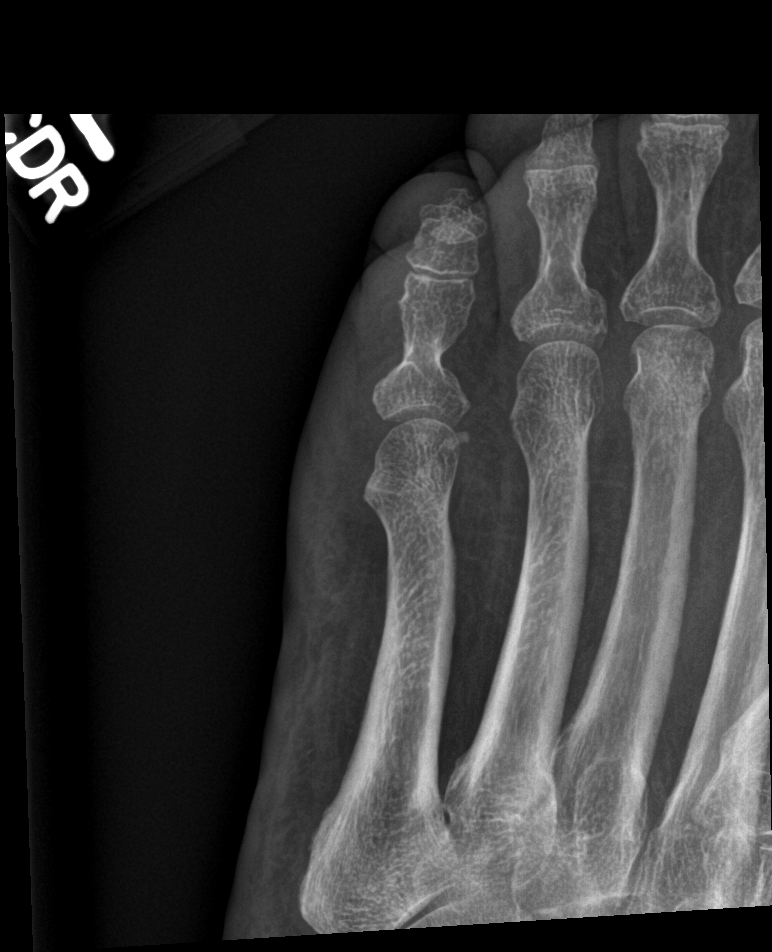

[t-spine ext]
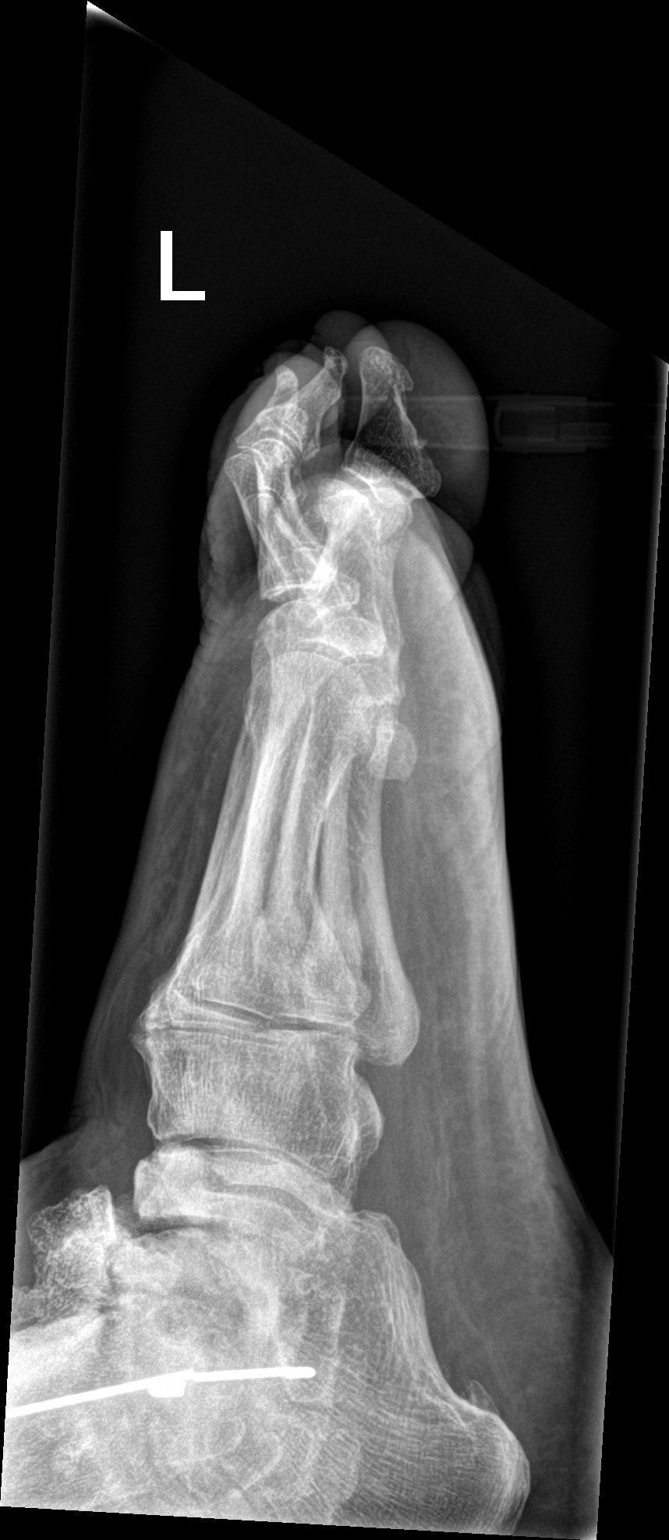

[3 of 3 positions shown; findings below may reference images not displayed]

FINDINGS: No fracture or malalignment. No soft tissue emphysema. No gross bony
destructive change or periostitis.
IMPRESSION: No acute osseous abnormality.

## 2020-09-08 MED ORDER — SODIUM CHLORIDE 0.9 % IV SOLN
1.0000 g | Freq: Once | INTRAVENOUS | Status: AC
  Administered 2020-09-08: 1 g via INTRAVENOUS
  Filled 2020-09-08: qty 10

## 2020-09-08 MED ORDER — CEPHALEXIN 500 MG PO CAPS
500.0000 mg | ORAL_CAPSULE | Freq: Three times a day (TID) | ORAL | 0 refills | Status: DC
Start: 2020-09-08 — End: 2020-09-24

## 2020-09-08 MED ORDER — ONDANSETRON HCL 4 MG/2ML IJ SOLN
4.0000 mg | Freq: Once | INTRAMUSCULAR | Status: AC
  Administered 2020-09-08: 4 mg via INTRAVENOUS
  Filled 2020-09-08: qty 2

## 2020-09-08 MED ORDER — SODIUM CHLORIDE 0.9 % IV BOLUS
1000.0000 mL | Freq: Once | INTRAVENOUS | Status: AC
  Administered 2020-09-08: 1000 mL via INTRAVENOUS

## 2020-09-08 MED ORDER — ONDANSETRON 4 MG PO TBDP: 4.0000 mg | ORAL_TABLET | Freq: Three times a day (TID) | ORAL | 0 refills | Status: DC | PRN

## 2020-09-08 NOTE — ED Provider Notes (Signed)
[facility] EMERGENCY DEPARTMENT Provider Note   CSN: [id number] Arrival date & time: 09/08/20  1937     History Chief Complaint  Patient presents with  . Emesis    Brittany Silva is a 64 y.o. female.  The history is provided by the patient and medical records. No language interpreter was used.  Emesis  Brittany Silva is a 64 y.o. female who presents to the Emergency Department complaining of nausea and vomiting. She presents the emergency department complaining of nausea and vomiting that started yesterday. She reports numerous episodes of emesis with associated lower abdominal discomfort that radiates to her back bilaterally and dark urine. She had a temperature to 98 at home. Denies any chest pain, shortness of breath, dysuria, diarrhea, constipation. She has a history of diabetes and had her medications adjusted several weeks ago. She also reports one week of ulcer to her left fourth toe. She was recently on antibiotics for toe ulcer and completed those about two weeks ago. No known sick contacts. She has been fully vaccinated for COVID-19.    Past Medical History:  Diagnosis Date  . Ankle fracture    left  . Anxiety    chronic  . Cervical back pain with evidence of disc disease   . Diabetes mellitus   . Dyslipidemia   . Edema    lower extremity  . GERD (gastroesophageal reflux disease)   . Hyperlipidemia   . Hypertension   . Hypothyroid   . Obstructive apnea    Split study 05-22-12 - patient was too claustrophobic and did not persue CPAP.   Marland Kitchen Osteoarthritis   . PVC's (premature ventricular contractions)   . Retinopathy    diabetic, mild, nonproliferative bilateral  . TSH (thyroid-stimulating hormone deficiency)    evaluation off RX in 4/14  . Vitamin D deficiency     Patient Active Problem List   Diagnosis Date Noted  . Obstructive apnea   . Sleep disturbance 08/17/2012  . History of mammogram 07/29/2012  . Chest pain 04/06/2012  . EDEMA  05/16/2008  . Morbid obesity ([place]) 04/13/2008  . CARPAL TUNNEL SYNDROME, BILATERAL 01/15/2008  . ALLERGIC RHINITIS, SEASONAL 01/06/2008  . ANXIETY DISORDER, GENERALIZED 05/01/2007  . PERIPHERAL NEUROPATHY 05/01/2007  . ENDOMETRIAL POLYP 05/01/2007  . HYPERPLASIA, ENDOMETRIAL NOS 05/01/2007  . DIABETES MELLITUS II, UNCOMPLICATED 03/00/9233  . HYPERLIPIDEMIA 01/29/2007  . ANEMIA, OTHER, UNSPECIFIED 01/29/2007  . HYPERTENSION, BENIGN SYSTEMIC 01/29/2007    Past Surgical History:  Procedure Laterality Date  . ABDOMINAL HYSTERECTOMY    . CATARACT EXTRACTION Bilateral 2012  . EYE SURGERY Right 06/26/11   cataract removal Rt eye  . FOOT SURGERY     Ankle fracture  . index finger surgery  2007   spider bite  . ORIF ANKLE FRACTURE Left [id number]  . Right knee surgery  1966   torn cartilage  . TONSILLECTOMY  1962   adenoidectomy  . TUBAL LIGATION Bilateral 1986     OB History   No obstetric history on file.     Family History  Problem Relation Age of Onset  . Coronary artery disease Father        MI at age 23  . Hypertension Mother   . Thyroid disease Mother   . Ulcers Sister   . Allergies Sister   . Psoriasis Sister   . Diabetes Maternal Grandfather   . Heart attack Paternal Grandfather   . Colon cancer Neg Hx     Social History  Tobacco Use  . Smoking status: Never Smoker  . Smokeless tobacco: Never Used  Substance Use Topics  . Alcohol use: No  . Drug use: No    Home Medications Prior to Admission medications   Medication Sig Start Date End Date Taking? Authorizing Provider  acetaminophen (TYLENOL) 500 MG tablet Take 500 mg by mouth as needed.    [provider]  amLODipine (NORVASC) 5 MG tablet Take 1 tablet (5 mg total) by mouth daily. 06/19/12   Barton Fanny, MD  atorvastatin (LIPITOR) 40 MG tablet Take 40 mg by mouth daily.    [provider]  cephALEXin (KEFLEX) 500 MG capsule Take 1 capsule (500 mg total) by mouth 3 (three)  times daily. 09/08/20   Quintella Reichert, MD  escitalopram (LEXAPRO) 10 MG tablet  04/20/14   [provider]  etodolac (LODINE) 300 MG capsule 300 mg 2 (two) times daily.  03/25/14   [provider]  fluticasone (FLONASE) 50 MCG/ACT nasal spray as needed.  04/20/14   [provider]  furosemide (LASIX) 20 MG tablet  07/20/14   [provider]  gabapentin (NEURONTIN) 100 MG capsule  08/09/14   [provider]  insulin NPH-insulin regular (HUMULIN 70/30) (70-30) 100 UNIT/ML injection Inject 90 units under skin every AM and 70 units under skin every PM with meals. 07/08/12   Barton Fanny, MD  levothyroxine (SYNTHROID) 25 MCG tablet Take 37.5 mcg by mouth daily.     [provider]  losartan (COZAAR) 100 MG tablet  03/29/13   [provider]  ondansetron (ZOFRAN ODT) 4 MG disintegrating tablet Take 1 tablet (4 mg total) by mouth every 8 (eight) hours as needed for nausea or vomiting. 09/08/20   Quintella Reichert, MD  sucralfate (CARAFATE) 1 G tablet Take 1 tablet (1 g total) by mouth 2 (two) times daily. 03/16/12 03/16/13  Hayden Rasmussen, MD    Allergies    Hydrochlorothiazide, Adhesive [tape], Codeine phosphate, Darvon, Darvon [propoxyphene hcl], Hydrocodone-acetaminophen, Propoxyphene hcl, Latex, Neosporin [neomycin-bacitracin zn-polymyx], and Sulfamethoxazole  Review of Systems   Review of Systems  Gastrointestinal: Positive for vomiting.  All other systems reviewed and are negative.   Physical Exam Updated Vital Signs BP (!) 154/58 (BP Location: Right Arm)   Pulse 90   Temp 98.4 F (36.9 C)   Resp 16   Ht 5\' 7"  (1.702 m)   Wt 129.3 kg   SpO2 94%   BMI 44.64 kg/m   Physical Exam Vitals and nursing note reviewed.  Constitutional:      Appearance: She is well-developed.  HENT:     Head: Normocephalic and atraumatic.  Cardiovascular:     Rate and Rhythm: Normal rate and regular rhythm.     Heart sounds: No murmur heard.     Pulmonary:     Effort: Pulmonary effort is normal. No respiratory distress.     Breath sounds: Normal breath sounds.  Abdominal:     Palpations: Abdomen is soft.     Tenderness: There is no abdominal tenderness. There is no guarding or rebound.  Musculoskeletal:        General: No tenderness.     Comments: 2+ DP pulses bilaterally. The left fourth toe has mild erythema and moderate edema. There is a callous with ulcer at the distal portion of the plantar surface of the fourth toe. No edema or erythema to the foot.  Skin:    General: Skin is warm and dry.  Neurological:  Mental Status: She is alert and oriented to person, place, and time.  Psychiatric:        Behavior: Behavior normal.     ED Results / Procedures / Treatments   Labs (all labs ordered are listed, but only abnormal results are displayed) Labs Reviewed  COMPREHENSIVE METABOLIC PANEL - Abnormal; Notable for the following components:      Result Value   Sodium 130 (*)    Chloride 91 (*)    Glucose, Bld 208 (*)    BUN 27 (*)    Creatinine, Ser 1.08 (*)    Calcium 8.2 (*)    Albumin 3.3 (*)    Total Bilirubin 1.5 (*)    GFR, Estimated 54 (*)    All other components within normal limits  CBC WITH DIFFERENTIAL/PLATELET - Abnormal; Notable for the following components:   WBC 12.0 (*)    Neutro Abs 10.3 (*)    All other components within normal limits  URINALYSIS, ROUTINE W REFLEX MICROSCOPIC - Abnormal; Notable for the following components:   APPearance CLOUDY (*)    Glucose, UA >=500 (*)    Hgb urine dipstick TRACE (*)    Bilirubin Urine SMALL (*)    Protein, ur 30 (*)    All other components within normal limits  URINALYSIS, MICROSCOPIC (REFLEX) - Abnormal; Notable for the following components:   Bacteria, UA MANY (*)    All other components within normal limits  CBG MONITORING, ED - Abnormal; Notable for the following components:   Glucose-Capillary 208 (*)    All other components within normal limits   URINE CULTURE  LIPASE, BLOOD    EKG None  Radiology DG Toe 4th Left  Result Date: 09/08/2020 CLINICAL DATA:  Wound to fourth toe EXAM: LEFT FOURTH TOE COMPARISON:  05/12/2014 FINDINGS: No fracture or malalignment. No soft tissue emphysema. No gross bony destructive change or periostitis. IMPRESSION: No acute osseous abnormality. Electronically Signed   By: Donavan Foil M.D.   On: 09/08/2020 22:10    Procedures Procedures (including critical care time)  Medications Ordered in ED Medications  cefTRIAXone (ROCEPHIN) 1 g in sodium chloride 0.9 % 100 mL IVPB (1 g Intravenous New Bag/Given 09/08/20 2323)  sodium chloride 0.9 % bolus 1,000 mL (0 mLs Intravenous Stopped 09/08/20 2201)  ondansetron (ZOFRAN) injection 4 mg (4 mg Intravenous Given 09/08/20 2119)    ED Course  I have reviewed the triage vital signs and the nursing notes.  Pertinent labs & imaging results that were available during my care of the patient were reviewed by me and considered in my medical decision making (see chart for details).    MDM Rules/Calculators/A&P                         Patient with history of diabetes here for evaluation of lower abdominal pain, nausea and vomiting. She is dehydrated appearing on evaluation but non-toxic appearing. UA is concerning for UTI. CBC with mild leukocytosis. BMP with mild elevation and creatinine when compared to priors, hyponatremia. No evidence of DKA. She was treated with IV fluid hydration, antiemetics and antibiotics for UTI. Presentation is not consistent with serious intra-abdominal infection, bowel obstruction or sepsis. She does have an ulcer to her left fourth toe, no evidence of osteomyelitis or abscess. Patient able to tolerate oral fluids after hydration and antiemetics in the emergency department. Discussed with patient home care for UTI and dehydration. Discussed outpatient follow-up and return precautions.  Final  Clinical Impression(s) / ED Diagnoses Final  diagnoses:  Acute UTI  Dehydration    Rx / DC Orders ED Discharge Orders         Ordered    cephALEXin (KEFLEX) 500 MG capsule  3 times daily        09/08/20 2312    ondansetron (ZOFRAN ODT) 4 MG disintegrating tablet  Every 8 hours PRN        09/08/20 2312           Quintella Reichert, MD 09/08/20 2346

## 2020-09-08 NOTE — ED Notes (Signed)
Water given  

## 2020-09-08 NOTE — ED Triage Notes (Signed)
C/o n/v x 3 days

## 2020-09-10 LAB — URINE CULTURE: Culture: 80000 — AB

## 2020-09-11 ENCOUNTER — Inpatient Hospital Stay (HOSPITAL_COMMUNITY)
Admission: EM | Admit: 2020-09-11 | Discharge: 2020-09-24 | DRG: 041 | Disposition: A | Discharge status: Home Health | Payer: 59 | Attending: Family Medicine | Admitting: Family Medicine

## 2020-09-11 ENCOUNTER — Other Ambulatory Visit: Payer: Self-pay

## 2020-09-11 DIAGNOSIS — E11649 Type 2 diabetes mellitus with hypoglycemia without coma: Secondary | ICD-10-CM | POA: Diagnosis present

## 2020-09-11 DIAGNOSIS — M25579 Pain in unspecified ankle and joints of unspecified foot: Secondary | ICD-10-CM

## 2020-09-11 DIAGNOSIS — Z9841 Cataract extraction status, right eye: Secondary | ICD-10-CM

## 2020-09-11 DIAGNOSIS — G8929 Other chronic pain: Secondary | ICD-10-CM | POA: Diagnosis present

## 2020-09-11 DIAGNOSIS — E11621 Type 2 diabetes mellitus with foot ulcer: Secondary | ICD-10-CM | POA: Diagnosis present

## 2020-09-11 DIAGNOSIS — E871 Hypo-osmolality and hyponatremia: Secondary | ICD-10-CM | POA: Diagnosis present

## 2020-09-11 DIAGNOSIS — Z885 Allergy status to narcotic agent status: Secondary | ICD-10-CM

## 2020-09-11 DIAGNOSIS — E1142 Type 2 diabetes mellitus with diabetic polyneuropathy: Secondary | ICD-10-CM | POA: Diagnosis present

## 2020-09-11 DIAGNOSIS — E785 Hyperlipidemia, unspecified: Secondary | ICD-10-CM | POA: Diagnosis present

## 2020-09-11 DIAGNOSIS — E876 Hypokalemia: Secondary | ICD-10-CM | POA: Diagnosis present

## 2020-09-11 DIAGNOSIS — Z791 Long term (current) use of non-steroidal anti-inflammatories (NSAID): Secondary | ICD-10-CM

## 2020-09-11 DIAGNOSIS — Z89431 Acquired absence of right foot: Secondary | ICD-10-CM

## 2020-09-11 DIAGNOSIS — Z20822 Contact with and (suspected) exposure to covid-19: Secondary | ICD-10-CM | POA: Diagnosis present

## 2020-09-11 DIAGNOSIS — Z79899 Other long term (current) drug therapy: Secondary | ICD-10-CM

## 2020-09-11 DIAGNOSIS — G061 Intraspinal abscess and granuloma: Secondary | ICD-10-CM | POA: Diagnosis not present

## 2020-09-11 DIAGNOSIS — F40240 Claustrophobia: Secondary | ICD-10-CM | POA: Diagnosis present

## 2020-09-11 DIAGNOSIS — E1165 Type 2 diabetes mellitus with hyperglycemia: Secondary | ICD-10-CM | POA: Diagnosis present

## 2020-09-11 DIAGNOSIS — M5116 Intervertebral disc disorders with radiculopathy, lumbar region: Secondary | ICD-10-CM | POA: Diagnosis present

## 2020-09-11 DIAGNOSIS — Z882 Allergy status to sulfonamides status: Secondary | ICD-10-CM

## 2020-09-11 DIAGNOSIS — Z888 Allergy status to other drugs, medicaments and biological substances status: Secondary | ICD-10-CM

## 2020-09-11 DIAGNOSIS — Z6841 Body Mass Index (BMI) 40.0 and over, adult: Secondary | ICD-10-CM

## 2020-09-11 DIAGNOSIS — I10 Essential (primary) hypertension: Secondary | ICD-10-CM | POA: Diagnosis present

## 2020-09-11 DIAGNOSIS — F419 Anxiety disorder, unspecified: Secondary | ICD-10-CM | POA: Diagnosis present

## 2020-09-11 DIAGNOSIS — E559 Vitamin D deficiency, unspecified: Secondary | ICD-10-CM | POA: Diagnosis present

## 2020-09-11 DIAGNOSIS — E039 Hypothyroidism, unspecified: Secondary | ICD-10-CM | POA: Diagnosis present

## 2020-09-11 DIAGNOSIS — E11319 Type 2 diabetes mellitus with unspecified diabetic retinopathy without macular edema: Secondary | ICD-10-CM | POA: Diagnosis present

## 2020-09-11 DIAGNOSIS — Z8349 Family history of other endocrine, nutritional and metabolic diseases: Secondary | ICD-10-CM

## 2020-09-11 DIAGNOSIS — Z794 Long term (current) use of insulin: Secondary | ICD-10-CM

## 2020-09-11 DIAGNOSIS — E1169 Type 2 diabetes mellitus with other specified complication: Secondary | ICD-10-CM | POA: Diagnosis present

## 2020-09-11 DIAGNOSIS — L97509 Non-pressure chronic ulcer of other part of unspecified foot with unspecified severity: Secondary | ICD-10-CM

## 2020-09-11 DIAGNOSIS — L97529 Non-pressure chronic ulcer of other part of left foot with unspecified severity: Secondary | ICD-10-CM | POA: Diagnosis present

## 2020-09-11 DIAGNOSIS — M86172 Other acute osteomyelitis, left ankle and foot: Secondary | ICD-10-CM | POA: Diagnosis present

## 2020-09-11 DIAGNOSIS — B999 Unspecified infectious disease: Secondary | ICD-10-CM

## 2020-09-11 DIAGNOSIS — Z9109 Other allergy status, other than to drugs and biological substances: Secondary | ICD-10-CM

## 2020-09-11 DIAGNOSIS — M549 Dorsalgia, unspecified: Secondary | ICD-10-CM | POA: Diagnosis not present

## 2020-09-11 DIAGNOSIS — Z833 Family history of diabetes mellitus: Secondary | ICD-10-CM

## 2020-09-11 DIAGNOSIS — Z9842 Cataract extraction status, left eye: Secondary | ICD-10-CM

## 2020-09-11 DIAGNOSIS — K219 Gastro-esophageal reflux disease without esophagitis: Secondary | ICD-10-CM | POA: Diagnosis present

## 2020-09-11 DIAGNOSIS — Z881 Allergy status to other antibiotic agents status: Secondary | ICD-10-CM

## 2020-09-11 DIAGNOSIS — G062 Extradural and subdural abscess, unspecified: Secondary | ICD-10-CM

## 2020-09-11 DIAGNOSIS — N3000 Acute cystitis without hematuria: Secondary | ICD-10-CM | POA: Diagnosis present

## 2020-09-11 DIAGNOSIS — Z90710 Acquired absence of both cervix and uterus: Secondary | ICD-10-CM

## 2020-09-11 DIAGNOSIS — M545 Low back pain, unspecified: Secondary | ICD-10-CM

## 2020-09-11 DIAGNOSIS — L089 Local infection of the skin and subcutaneous tissue, unspecified: Secondary | ICD-10-CM | POA: Diagnosis present

## 2020-09-11 DIAGNOSIS — Z8249 Family history of ischemic heart disease and other diseases of the circulatory system: Secondary | ICD-10-CM

## 2020-09-11 DIAGNOSIS — Z79890 Hormone replacement therapy (postmenopausal): Secondary | ICD-10-CM

## 2020-09-11 DIAGNOSIS — I493 Ventricular premature depolarization: Secondary | ICD-10-CM | POA: Diagnosis present

## 2020-09-11 DIAGNOSIS — G4733 Obstructive sleep apnea (adult) (pediatric): Secondary | ICD-10-CM | POA: Diagnosis present

## 2020-09-11 DIAGNOSIS — Z8614 Personal history of Methicillin resistant Staphylococcus aureus infection: Secondary | ICD-10-CM

## 2020-09-11 LAB — CBC
HCT: 38.4 % (ref 36.0–46.0)
Hemoglobin: 12.7 g/dL (ref 12.0–15.0)
MCH: 28.9 pg (ref 26.0–34.0)
MCHC: 33.1 g/dL (ref 30.0–36.0)
MCV: 87.3 fL (ref 80.0–100.0)
Platelets: 265 10*3/uL (ref 150–400)
RBC: 4.4 MIL/uL (ref 3.87–5.11)
RDW: 13.8 % (ref 11.5–15.5)
WBC: 10.7 10*3/uL — ABNORMAL HIGH (ref 4.0–10.5)
nRBC: 0 % (ref 0.0–0.2)

## 2020-09-11 LAB — BASIC METABOLIC PANEL
Anion gap: 9 (ref 5–15)
BUN: 17 mg/dL (ref 8–23)
CO2: 28 mmol/L (ref 22–32)
Calcium: 8.1 mg/dL — ABNORMAL LOW (ref 8.9–10.3)
Chloride: 97 mmol/L — ABNORMAL LOW (ref 98–111)
Creatinine, Ser: 0.89 mg/dL (ref 0.44–1.00)
GFR, Estimated: >60 mL/min (ref >60)
Glucose, Bld: 76 mg/dL (ref 70–99)
Potassium: 3.1 mmol/L — ABNORMAL LOW (ref 3.5–5.1)
Sodium: 134 mmol/L — ABNORMAL LOW (ref 135–145)

## 2020-09-11 LAB — URINE CULTURE

## 2020-09-11 LAB — CBG MONITORING, ED: Glucose-Capillary: 73 mg/dL (ref 70–99)

## 2020-09-11 NOTE — ED Provider Notes (Addendum)
[facility] DEPT Provider Note  CSN: [id number] Arrival date & time: 09/11/20 2144  Chief Complaint(s) Back Pain  HPI Brittany Silva is a 64 y.o. female   The history is provided by the patient.  Back Pain Location:  Sacro-iliac joint Quality:  Aching and cramping Radiates to:  R thigh Pain severity:  Moderate Onset quality:  Sudden Duration:  4 days Timing:  Constant Progression:  Waxing and waning Chronicity:  New Context: physical stress and twisting   Relieved by:  Being still Worsened by:  Movement, twisting and touching Ineffective treatments:  Heating pad and OTC medications Associated symptoms: no bladder incontinence (but did report trouble voiding), no bowel incontinence, no numbness, no paresthesias, no perianal numbness and no tingling    New insulin regimen; Humalog and levemir from Humulin 70/30.  On Keflex for UTI; 500mg  TID.  Reports taking large doses of Tylenol for the past 4 days. Reports taking 10 500mg  tablets today. Last dose around 6pm.  Past Medical History Past Medical History:  Diagnosis Date  . Ankle fracture    left  . Anxiety    chronic  . Cervical back pain with evidence of disc disease   . Diabetes mellitus   . Dyslipidemia   . Edema    lower extremity  . GERD (gastroesophageal reflux disease)   . Hyperlipidemia   . Hypertension   . Hypothyroid   . Obstructive apnea    Split study 05-22-12 - patient was too claustrophobic and did not persue CPAP.   Marland Kitchen Osteoarthritis   . PVC's (premature ventricular contractions)   . Retinopathy    diabetic, mild, nonproliferative bilateral  . TSH (thyroid-stimulating hormone deficiency)    evaluation off RX in 4/14  . Vitamin D deficiency    Patient Active Problem List   Diagnosis Date Noted  . Obstructive apnea   . Sleep disturbance 08/17/2012  . History of mammogram 07/29/2012  . Chest pain 04/06/2012  . EDEMA 05/16/2008  . Morbid obesity ([place])  04/13/2008  . CARPAL TUNNEL SYNDROME, BILATERAL 01/15/2008  . ALLERGIC RHINITIS, SEASONAL 01/06/2008  . ANXIETY DISORDER, GENERALIZED 05/01/2007  . PERIPHERAL NEUROPATHY 05/01/2007  . ENDOMETRIAL POLYP 05/01/2007  . HYPERPLASIA, ENDOMETRIAL NOS 05/01/2007  . DIABETES MELLITUS II, UNCOMPLICATED 67/20/9470  . HYPERLIPIDEMIA 01/29/2007  . ANEMIA, OTHER, UNSPECIFIED 01/29/2007  . HYPERTENSION, BENIGN SYSTEMIC 01/29/2007   Home Medication(s) Prior to Admission medications   Medication Sig Start Date End Date Taking? Authorizing Provider  acetaminophen (TYLENOL) 500 MG tablet Take 500 mg by mouth as needed.    [provider]  amLODipine (NORVASC) 5 MG tablet Take 1 tablet (5 mg total) by mouth daily. 06/19/12   Barton Fanny, MD  atorvastatin (LIPITOR) 40 MG tablet Take 40 mg by mouth daily.    [provider]  cephALEXin (KEFLEX) 500 MG capsule Take 1 capsule (500 mg total) by mouth 3 (three) times daily. 09/08/20   Quintella Reichert, MD  escitalopram (LEXAPRO) 10 MG tablet  04/20/14   [provider]  etodolac (LODINE) 300 MG capsule 300 mg 2 (two) times daily.  03/25/14   [provider]  fluticasone (FLONASE) 50 MCG/ACT nasal spray as needed.  04/20/14   [provider]  furosemide (LASIX) 20 MG tablet  07/20/14   [provider]  gabapentin (NEURONTIN) 100 MG capsule  08/09/14   [provider]  insulin NPH-insulin regular (HUMULIN 70/30) (70-30) 100 UNIT/ML injection Inject 90 units under skin every AM and  70 units under skin every PM with meals. 07/08/12   Barton Fanny, MD  levothyroxine (SYNTHROID) 25 MCG tablet Take 37.5 mcg by mouth daily.     [provider]  losartan (COZAAR) 100 MG tablet  03/29/13   [provider]  ondansetron (ZOFRAN ODT) 4 MG disintegrating tablet Take 1 tablet (4 mg total) by mouth every 8 (eight) hours as needed for nausea or vomiting. 09/08/20   Quintella Reichert, MD  sucralfate  (CARAFATE) 1 G tablet Take 1 tablet (1 g total) by mouth 2 (two) times daily. 03/16/12 03/16/13  Hayden Rasmussen, MD                                                                                                                                    Past Surgical History Past Surgical History:  Procedure Laterality Date  . ABDOMINAL HYSTERECTOMY    . CATARACT EXTRACTION Bilateral 2012  . EYE SURGERY Right 06/26/11   cataract removal Rt eye  . FOOT SURGERY     Ankle fracture  . index finger surgery  2007   spider bite  . ORIF ANKLE FRACTURE Left [id number]  . Right knee surgery  1966   torn cartilage  . TONSILLECTOMY  1962   adenoidectomy  . TUBAL LIGATION Bilateral 1986   Family History Family History  Problem Relation Age of Onset  . Coronary artery disease Father        MI at age 94  . Hypertension Mother   . Thyroid disease Mother   . Ulcers Sister   . Allergies Sister   . Psoriasis Sister   . Diabetes Maternal Grandfather   . Heart attack Paternal Grandfather   . Colon cancer Neg Hx     Social History Social History   Tobacco Use  . Smoking status: Never Smoker  . Smokeless tobacco: Never Used  Substance Use Topics  . Alcohol use: No  . Drug use: No   Allergies Hydrochlorothiazide, Adhesive [tape], Codeine phosphate, Darvon, Darvon [propoxyphene hcl], Hydrocodone-acetaminophen, Propoxyphene hcl, Latex, Neosporin [neomycin-bacitracin zn-polymyx], and Sulfamethoxazole  Review of Systems Review of Systems  Gastrointestinal: Negative for bowel incontinence.  Genitourinary: Negative for bladder incontinence (but did report trouble voiding).  Musculoskeletal: Positive for back pain.  Neurological: Negative for tingling, numbness and paresthesias.   All other systems are reviewed and are negative for acute change except as noted in the HPI  Physical Exam Vital Signs  I have reviewed the triage vital signs BP (!) 180/78   Pulse 69   Temp 97.6 F (36.4 C) (Oral)    Resp 14   SpO2 100%   Physical Exam Vitals reviewed.  Constitutional:      General: She is not in acute distress.    Appearance: She is well-developed. She is obese. She is not diaphoretic.  HENT:     Head: Normocephalic and atraumatic.     Right Ear:  External ear normal.     Left Ear: External ear normal.     Nose: Nose normal.  Eyes:     General: No scleral icterus.    Conjunctiva/sclera: Conjunctivae normal.  Neck:     Trachea: Phonation normal.  Cardiovascular:     Rate and Rhythm: Normal rate and regular rhythm.  Pulmonary:     Effort: Pulmonary effort is normal. No respiratory distress.     Breath sounds: No stridor.  Abdominal:     General: There is no distension.  Musculoskeletal:        General: Normal range of motion.     Cervical back: Normal range of motion.     Lumbar back: Spasms and tenderness present. No bony tenderness.       Back:  Neurological:     Mental Status: She is alert and oriented to person, place, and time.     Comments: Spine Exam: Strength: 4/5 throughout LE bilaterally (limited due to pain) Sensation: Intact to light touch in proximal and distal LE bilaterally    Psychiatric:        Behavior: Behavior normal.     ED Results and Treatments Labs (all labs ordered are listed, but only abnormal results are displayed) Labs Reviewed  BASIC METABOLIC PANEL - Abnormal; Notable for the following components:      Result Value   Sodium 134 (*)    Potassium 3.1 (*)    Chloride 97 (*)    Calcium 8.1 (*)    All other components within normal limits  CBC - Abnormal; Notable for the following components:   WBC 10.7 (*)    All other components within normal limits  URINALYSIS, ROUTINE W REFLEX MICROSCOPIC - Abnormal; Notable for the following components:   Glucose, UA >=500 (*)    Bacteria, UA RARE (*)    All other components within normal limits  HEPATIC FUNCTION PANEL - Abnormal; Notable for the following components:   Albumin 2.8 (*)      Alkaline Phosphatase 128 (*)    All other components within normal limits  SALICYLATE LEVEL - Abnormal; Notable for the following components:   Salicylate Lvl <7.8 (*)    All other components within normal limits  ACETAMINOPHEN LEVEL  ACETAMINOPHEN LEVEL  CBG MONITORING, ED                                                                                                                         EKG  EKG Interpretation  Date/Time:  Monday September 11 2020 22:42:52 EDT Ventricular Rate:  63 PR Interval:    QRS Duration: 112 QT Interval:  474 QTC Calculation: 486 R Axis:   -10 Text Interpretation: Sinus rhythm Atrial premature complexes Incomplete right bundle branch block Borderline prolonged QT interval Confirmed by Addison Lank ([id number]) on 09/12/2020 6:18:46 AM      Radiology No results found.  Pertinent labs & imaging results that were available during my care of the patient were  reviewed by me and considered in my medical decision making (see chart for details).  Medications Ordered in ED Medications  methocarbamol (ROBAXIN) 500 mg in dextrose 5 % 50 mL IVPB (500 mg Intravenous New Bag/Given (Non-Interop) 09/12/20 0722)  diazepam (VALIUM) injection 2.5 mg (2.5 mg Intravenous Given 09/12/20 0721)  ketorolac (TORADOL) 15 MG/ML injection 15 mg (15 mg Intravenous Given 09/12/20 0721)                                                                                                                                    Procedures .Critical Care Performed by: Fatima Blank, MD Authorized by: Fatima Blank, MD    CRITICAL CARE Performed by: Grayce Sessions Akiah Bauch Total critical care time: 60 minutes Critical care time was exclusive of separately billable procedures and treating other patients. Critical care was necessary to treat or prevent imminent or life-threatening deterioration. Critical care was time spent personally by me on the following activities:  development of treatment plan with patient and/or surrogate as well as nursing, discussions with consultants, evaluation of patient's response to treatment, examination of patient, obtaining history from patient or surrogate, ordering and performing treatments and interventions, ordering and review of laboratory studies, ordering and review of radiographic studies, pulse oximetry and re-evaluation of patient's condition.   (including critical care time)  Medical Decision Making / ED Course I have reviewed the nursing notes for this encounter and the patient's prior records (if available in EHR or on provided paperwork).   Brittany Silva was evaluated in Emergency Department on 09/12/2020 for the symptoms described in the history of present illness. She was evaluated in the context of the global COVID-19 pandemic, which necessitated consideration that the patient might be at risk for infection with the SARS-CoV-2 virus that causes COVID-19. Institutional protocols and algorithms that pertain to the evaluation of patients at risk for COVID-19 are in a state of rapid change based on information released by regulatory bodies including the CDC and federal and state organizations. These policies and algorithms were followed during the patient's care in the ED.    Patient presents with right lumbar pain with radiation to the right thigh. Currently being treated for urinary tract infection. She endorses difficulty voiding. No bowel incontinence. No loss of sensation.  Appears to be more muscular spasm related.  screening labs reassuring with improving leukocytosis.  Patient has mild decrease in her electrolytes without AKI.  Pending urine.  Patient admits to taking high doses of acetaminophen.  Denies SI. She is reportedly about 6 hours postingestion.  Acetaminophen level slightly elevated. Repeated 4 hours after arriving and downtrending.  LFTs are normal.  Doubt acetaminophen  toxicity.  Patient was unable to void.  Bladder scan revealed greater than 500 cc in the bladder.  UA showed improving infection.  Given her bladder retention, will obtain an MRI of her lumbar spine to rule out any stenosis.  Patient care turned over to Dr Lacinda Axon. Patient case and results discussed in detail; please see their note for further ED managment.            This chart was dictated using voice recognition software.  Despite best efforts to proofread,  errors can occur which can change the documentation meaning.   Fatima Blank, MD 09/12/20 0349    Fatima Blank, MD 09/12/20 1739

## 2020-09-11 NOTE — ED Triage Notes (Signed)
Pt. Is alert and oriented from home she arived by EMS.  Severe back pain for several days that radiates down her leg.  Hx: degenerative back disease, diabetes UTI on antibiotics for treatment 3 days now Reports urine is still dark   cbg 105

## 2020-09-12 ENCOUNTER — Telehealth: Payer: Self-pay | Admitting: *Deleted

## 2020-09-12 ENCOUNTER — Emergency Department (HOSPITAL_COMMUNITY): Payer: 59

## 2020-09-12 ENCOUNTER — Encounter (HOSPITAL_COMMUNITY): Payer: Self-pay | Admitting: Internal Medicine

## 2020-09-12 DIAGNOSIS — E11621 Type 2 diabetes mellitus with foot ulcer: Secondary | ICD-10-CM | POA: Diagnosis present

## 2020-09-12 DIAGNOSIS — G061 Intraspinal abscess and granuloma: Secondary | ICD-10-CM | POA: Diagnosis present

## 2020-09-12 DIAGNOSIS — Z6841 Body Mass Index (BMI) 40.0 and over, adult: Secondary | ICD-10-CM | POA: Diagnosis not present

## 2020-09-12 DIAGNOSIS — E11319 Type 2 diabetes mellitus with unspecified diabetic retinopathy without macular edema: Secondary | ICD-10-CM | POA: Diagnosis present

## 2020-09-12 DIAGNOSIS — L089 Local infection of the skin and subcutaneous tissue, unspecified: Secondary | ICD-10-CM | POA: Diagnosis present

## 2020-09-12 DIAGNOSIS — I517 Cardiomegaly: Secondary | ICD-10-CM | POA: Diagnosis not present

## 2020-09-12 DIAGNOSIS — F419 Anxiety disorder, unspecified: Secondary | ICD-10-CM | POA: Diagnosis present

## 2020-09-12 DIAGNOSIS — E871 Hypo-osmolality and hyponatremia: Secondary | ICD-10-CM | POA: Diagnosis present

## 2020-09-12 DIAGNOSIS — I348 Other nonrheumatic mitral valve disorders: Secondary | ICD-10-CM | POA: Diagnosis not present

## 2020-09-12 DIAGNOSIS — E039 Hypothyroidism, unspecified: Secondary | ICD-10-CM | POA: Diagnosis present

## 2020-09-12 DIAGNOSIS — E876 Hypokalemia: Secondary | ICD-10-CM | POA: Diagnosis present

## 2020-09-12 DIAGNOSIS — M86672 Other chronic osteomyelitis, left ankle and foot: Secondary | ICD-10-CM | POA: Diagnosis not present

## 2020-09-12 DIAGNOSIS — N3000 Acute cystitis without hematuria: Secondary | ICD-10-CM | POA: Diagnosis present

## 2020-09-12 DIAGNOSIS — G8929 Other chronic pain: Secondary | ICD-10-CM | POA: Diagnosis present

## 2020-09-12 DIAGNOSIS — I10 Essential (primary) hypertension: Secondary | ICD-10-CM | POA: Diagnosis present

## 2020-09-12 DIAGNOSIS — R5081 Fever presenting with conditions classified elsewhere: Secondary | ICD-10-CM | POA: Diagnosis not present

## 2020-09-12 DIAGNOSIS — L0390 Cellulitis, unspecified: Secondary | ICD-10-CM | POA: Diagnosis not present

## 2020-09-12 DIAGNOSIS — E11649 Type 2 diabetes mellitus with hypoglycemia without coma: Secondary | ICD-10-CM | POA: Diagnosis present

## 2020-09-12 DIAGNOSIS — E785 Hyperlipidemia, unspecified: Secondary | ICD-10-CM | POA: Diagnosis present

## 2020-09-12 DIAGNOSIS — E1142 Type 2 diabetes mellitus with diabetic polyneuropathy: Secondary | ICD-10-CM | POA: Diagnosis present

## 2020-09-12 DIAGNOSIS — E669 Obesity, unspecified: Secondary | ICD-10-CM | POA: Diagnosis not present

## 2020-09-12 DIAGNOSIS — M5116 Intervertebral disc disorders with radiculopathy, lumbar region: Secondary | ICD-10-CM | POA: Diagnosis present

## 2020-09-12 DIAGNOSIS — G4733 Obstructive sleep apnea (adult) (pediatric): Secondary | ICD-10-CM | POA: Diagnosis present

## 2020-09-12 DIAGNOSIS — E559 Vitamin D deficiency, unspecified: Secondary | ICD-10-CM | POA: Diagnosis present

## 2020-09-12 DIAGNOSIS — E1169 Type 2 diabetes mellitus with other specified complication: Secondary | ICD-10-CM | POA: Diagnosis present

## 2020-09-12 DIAGNOSIS — L97529 Non-pressure chronic ulcer of other part of left foot with unspecified severity: Secondary | ICD-10-CM | POA: Diagnosis present

## 2020-09-12 DIAGNOSIS — M86172 Other acute osteomyelitis, left ankle and foot: Secondary | ICD-10-CM | POA: Diagnosis present

## 2020-09-12 DIAGNOSIS — Z20822 Contact with and (suspected) exposure to covid-19: Secondary | ICD-10-CM | POA: Diagnosis present

## 2020-09-12 DIAGNOSIS — E1165 Type 2 diabetes mellitus with hyperglycemia: Secondary | ICD-10-CM | POA: Diagnosis not present

## 2020-09-12 DIAGNOSIS — G062 Extradural and subdural abscess, unspecified: Secondary | ICD-10-CM | POA: Diagnosis not present

## 2020-09-12 DIAGNOSIS — M549 Dorsalgia, unspecified: Secondary | ICD-10-CM | POA: Diagnosis present

## 2020-09-12 LAB — CBC
HCT: 40.5 % (ref 36.0–46.0)
Hemoglobin: 13.5 g/dL (ref 12.0–15.0)
MCH: 29.4 pg (ref 26.0–34.0)
MCHC: 33.3 g/dL (ref 30.0–36.0)
MCV: 88.2 fL (ref 80.0–100.0)
Platelets: 320 10*3/uL (ref 150–400)
RBC: 4.59 MIL/uL (ref 3.87–5.11)
RDW: 13.9 % (ref 11.5–15.5)
WBC: 11.7 10*3/uL — ABNORMAL HIGH (ref 4.0–10.5)
nRBC: 0 % (ref 0.0–0.2)

## 2020-09-12 LAB — URINALYSIS, ROUTINE W REFLEX MICROSCOPIC
Bilirubin Urine: NEGATIVE
Glucose, UA: >=500 mg/dL — AB
Hgb urine dipstick: NEGATIVE
Ketones, ur: NEGATIVE mg/dL
Leukocytes,Ua: NEGATIVE
Nitrite: NEGATIVE
Protein, ur: NEGATIVE mg/dL
Specific Gravity, Urine: 1.022 (ref 1.005–1.030)
pH: 5 (ref 5.0–8.0)

## 2020-09-12 LAB — HEPATIC FUNCTION PANEL
ALT: 22 U/L (ref 0–44)
AST: 37 U/L (ref 15–41)
Albumin: 2.8 g/dL — ABNORMAL LOW (ref 3.5–5.0)
Alkaline Phosphatase: 128 U/L — ABNORMAL HIGH (ref 38–126)
Bilirubin, Direct: 0.2 mg/dL (ref 0.0–0.2)
Indirect Bilirubin: 0.4 mg/dL (ref 0.3–0.9)
Total Bilirubin: 0.6 mg/dL (ref 0.3–1.2)
Total Protein: 6.9 g/dL (ref 6.5–8.1)

## 2020-09-12 LAB — ACETAMINOPHEN LEVEL
Acetaminophen (Tylenol), Serum: 13 ug/mL (ref 10–30)
Acetaminophen (Tylenol), Serum: 12 ug/mL (ref 10–30)

## 2020-09-12 LAB — MAGNESIUM: Magnesium: 2.4 mg/dL (ref 1.7–2.4)

## 2020-09-12 LAB — RESPIRATORY PANEL BY RT PCR (FLU A&B, COVID)
Influenza A by PCR: NEGATIVE
Influenza B by PCR: NEGATIVE
SARS Coronavirus 2 by RT PCR: NEGATIVE

## 2020-09-12 LAB — CREATININE, SERUM
Creatinine, Ser: 0.68 mg/dL (ref 0.44–1.00)
GFR, Estimated: >60 mL/min (ref >60)

## 2020-09-12 LAB — SALICYLATE LEVEL: Salicylate Lvl: <7 mg/dL — ABNORMAL LOW (ref 7.0–30.0)

## 2020-09-12 LAB — SEDIMENTATION RATE: Sed Rate: 85 mm/hr — ABNORMAL HIGH (ref 0–22)

## 2020-09-12 LAB — HIV ANTIBODY (ROUTINE TESTING W REFLEX): HIV Screen 4th Generation wRfx: NONREACTIVE

## 2020-09-12 LAB — GLUCOSE, CAPILLARY: Glucose-Capillary: 165 mg/dL — ABNORMAL HIGH (ref 70–99)

## 2020-09-12 LAB — C-REACTIVE PROTEIN: CRP: 22.9 mg/dL — ABNORMAL HIGH (ref <1.0)

## 2020-09-12 LAB — CBG MONITORING, ED: Glucose-Capillary: 65 mg/dL — ABNORMAL LOW (ref 70–99)

## 2020-09-12 IMAGING — MR MR LUMBAR SPINE W/O CM
4 of 5 series · 30 of 48 positions shown · non-contrast
Comparison: None.

CLINICAL DATA: Low back pain. Right sacroiliac pain radiating to
the right leg for 4 days. Inability to void.

EXAM:
MRI LUMBAR SPINE WITHOUT CONTRAST
TECHNIQUE: Multiplanar, multisequence MR imaging of the lumbar spine was
performed. No intravenous contrast was administered.

[Series 5: T1 · sagittal · 4.0mm · 0.81mm/px · 6 of 15 slices shown (1 of 2)]
[im 1/15]
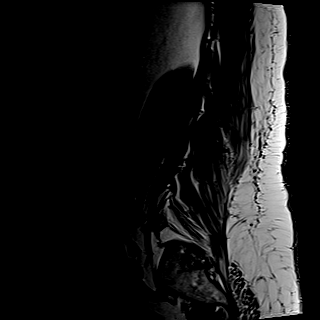
[im 3/15]
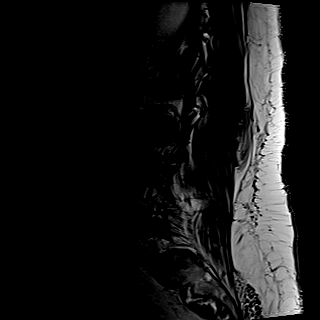
[im 6/15]
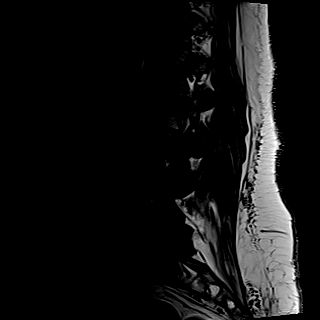
[im 9/15]
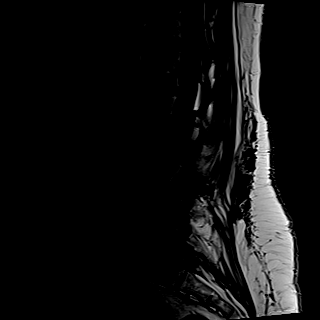
[im 12/15]
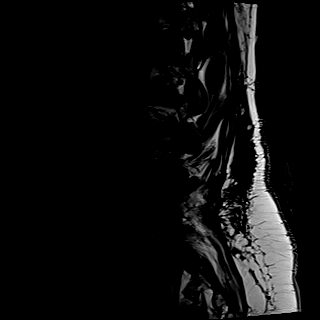
[im 15/15]
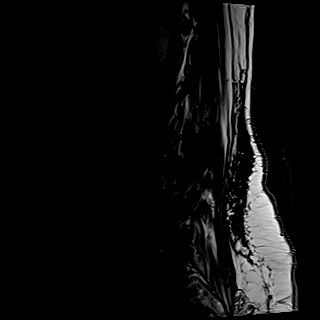

[Series 6: T2 · sagittal · 4.0mm · 0.81mm/px · 5 of 15 slices shown (1 of 2)]
[im 1/15]
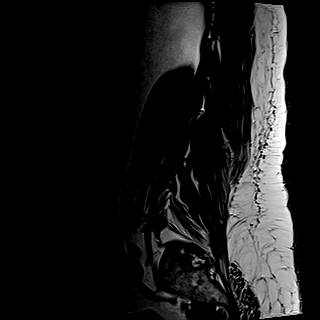
[im 4/15]
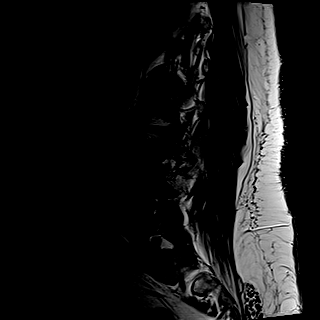
[im 8/15]
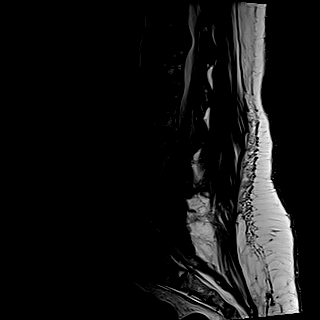
[im 11/15]
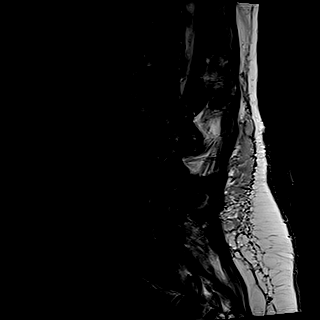
[im 15/15]
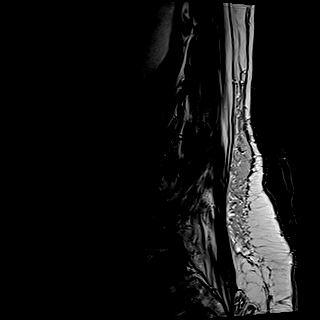

[Series 8: T2 · axial · 4.0mm · 0.62mm/px · z∈[-33,+174]mm · 10 of 44 slices shown (2 of 2)]
[im 3/44]
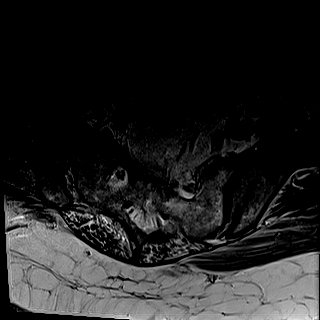
[im 6/44]
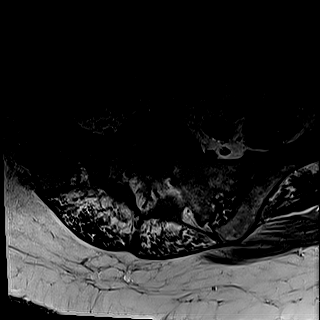
[im 9/44]
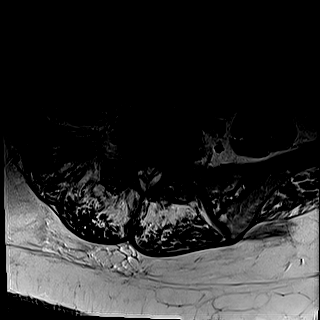
[im 15/44]
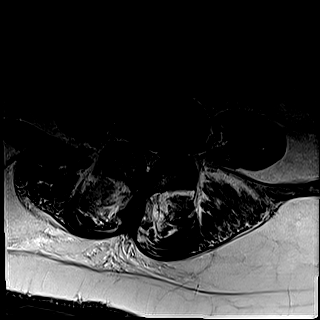
[im 21/44]
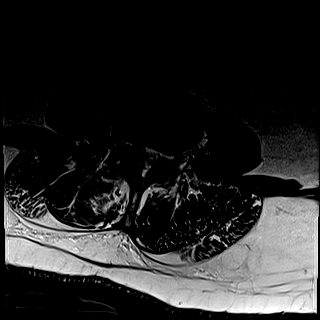
[im 23/44]
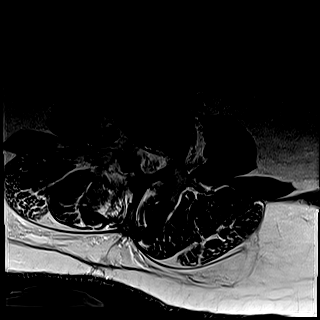
[im 26/44]
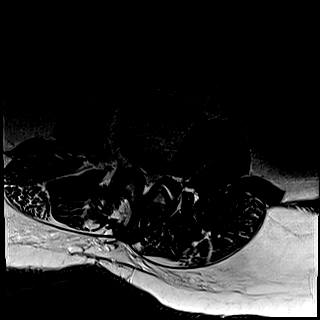
[im 32/44]
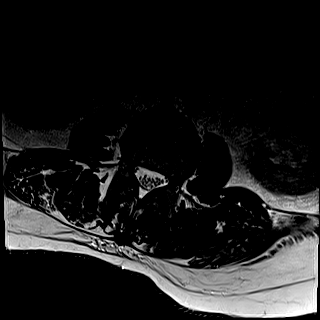
[im 38/44]
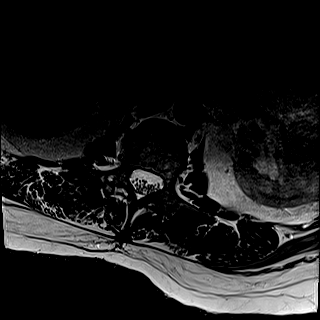
[im 44/44]
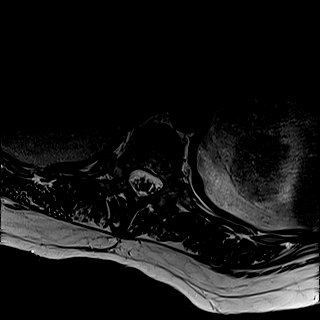

[Series 9: T1 · axial · 4.0mm · 0.39mm/px · z∈[-33,+144]mm · 9 of 44 slices shown (2 of 2)]
[im 3/44]
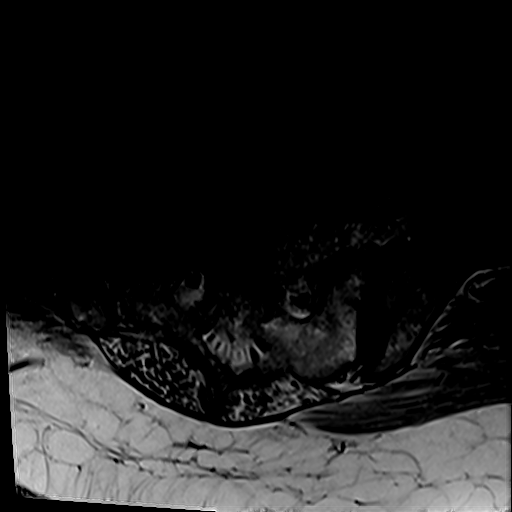
[im 6/44]
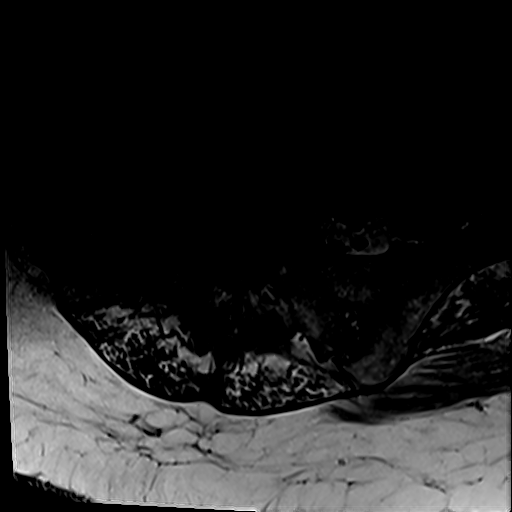
[im 9/44]
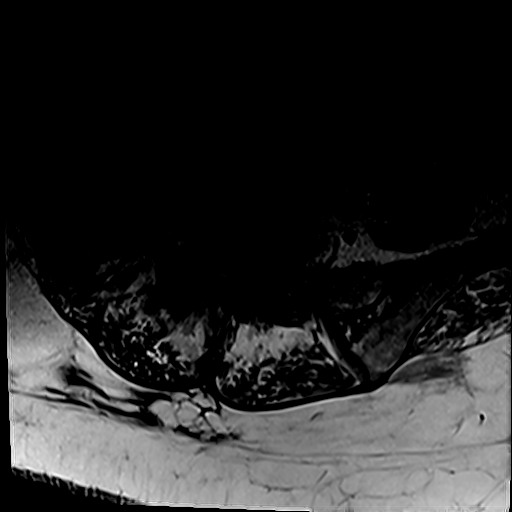
[im 15/44]
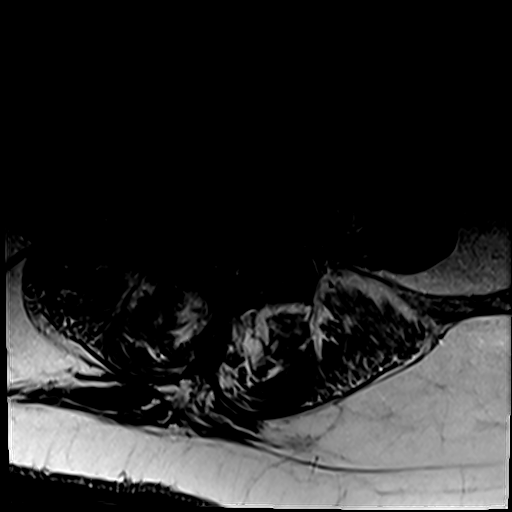
[im 21/44]
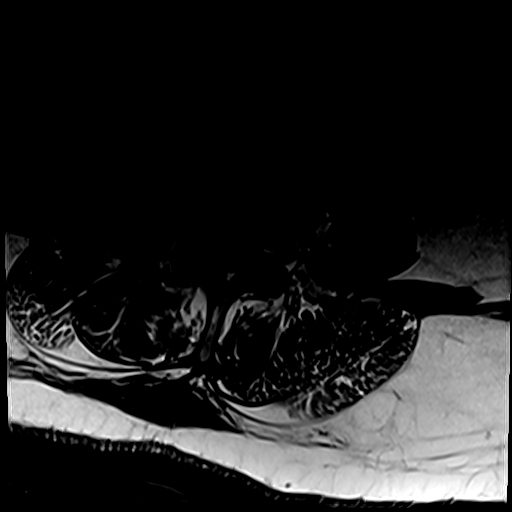
[im 23/44]
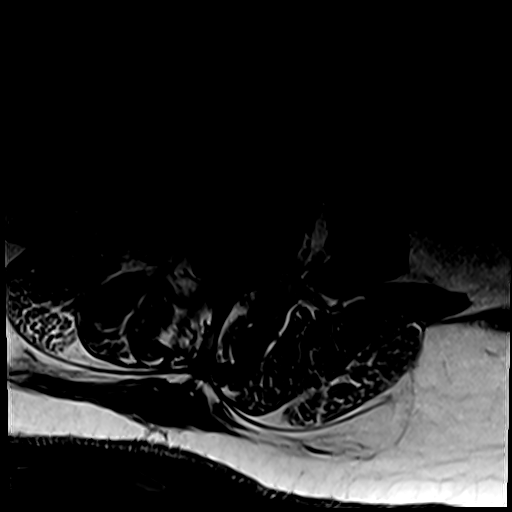
[im 26/44]
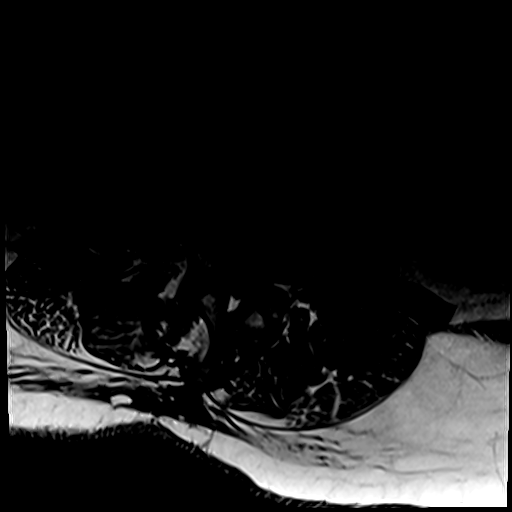
[im 32/44]
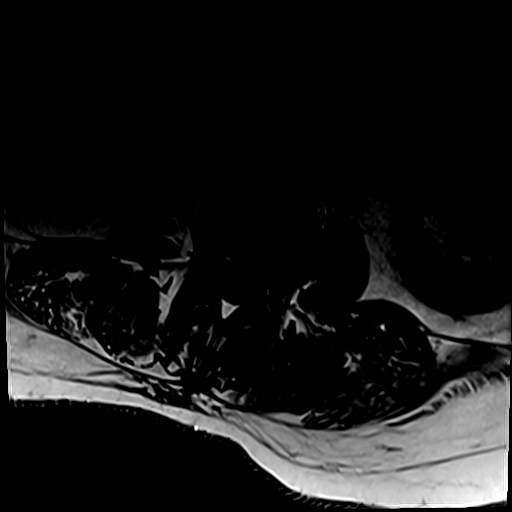
[im 38/44]
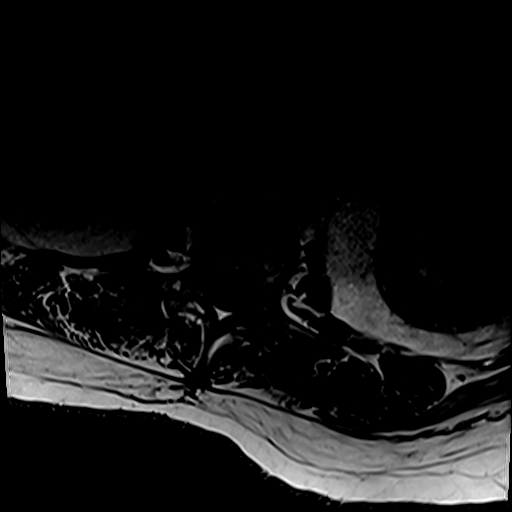

[30 of 48 positions shown; findings below may reference images not displayed]

FINDINGS: Segmentation: Normal lumbar segmentation is assumed with the lowest
fully formed disc space designated L5-S1.

Alignment: Mild lumbar levoscoliosis. Trace retrolisthesis of L2 on
L3, L3 on L4, and L4 on L5.

Vertebrae: There is mild chronic anterior wedging of the L2
vertebral body. There is no evidence of discitis. There is right
facet joint fluid at L4-5 without significant marrow edema or bone
destruction. A fluid collection posterior and inferior to the right
L4-5 facet joint measures 1.1 x 1.1 x 1.8 cm. There is abnormal T2
hyperintense material in the ventral and dorsal epidural space at
L5-S1 measuring up to 5 mm in thickness and resulting in severe
spinal stenosis.

Conus medullaris and cauda equina: Conus extends to the T12-L1
level. Conus and cauda equina appear normal.

Paraspinal and other soft tissues: Edema in the right-sided
posterior paraspinal musculature in the lower lumbar spine.

Disc levels:

T12-L1: Disc desiccation. Small left paracentral disc extrusion with
slight superior migration. No significant stenosis.

L1-2: Tiny central disc protrusion, mild left eccentric disc
bulging, and mild facet arthrosis without stenosis.

L2-3: Disc desiccation and severe disc space narrowing. Right
eccentric disc bulging, endplate spurring, disc space height loss,
and moderate facet and ligamentum flavum hypertrophy result in
moderate spinal stenosis, moderate to severe bilateral lateral
recess stenosis, and severe right neural foraminal stenosis.
Potential right L2 and bilateral L3 nerve root impingement.

L3-4: Disc desiccation. Disc bulging and severe facet and ligamentum
flavum hypertrophy result in mild-to-moderate spinal stenosis,
moderate bilateral lateral recess stenosis, and mild bilateral
neural foraminal stenosis.

L4-5: Disc desiccation. Disc bulging and severe facet and ligamentum
flavum hypertrophy result in moderate spinal stenosis, moderate left
greater than right lateral recess stenosis, and moderate left
greater than right neural foraminal stenosis.

L5-S1: Normal disc. Bilateral facet arthrosis. Severe spinal
stenosis due to the above described epidural process. Patent neural
foramina.
IMPRESSION: 1. Abnormal epidural process at L5-S1 most consistent with
infectious phlegmon or abscess resulting in severe spinal stenosis.
2. Right-sided facet joint fluid at L4-5 without significant marrow
edema or bone destruction, indeterminate for septic facet arthritis
although there is a small fluid collection posterior and inferior to
the facet joint which could reflect an abscess.
3. Right-sided posterior paraspinal muscle edema which may reflect
myositis.
4. Advanced disc degeneration at L2-3 with moderate spinal stenosis
and moderate to severe lateral recess stenosis.
5. Moderate lateral recess stenosis at L3-4 and L4-5.

## 2020-09-12 IMAGING — MR MR LUMBAR SPINE W/ CM
4 series · 32 of 48 positions shown · IV contrast (gadavist)
Comparison: MRI lumbar spine without contrast [DATE]. CT lumbar
spine [DATE]

CLINICAL DATA: Numbness and tingling, paresthesia. Concern for
spinal infection on unenhanced MRI. Diabetes. Leukocytosis.

EXAM:
MRI LUMBAR SPINE WITH CONTRAST
CONTRAST:  10mL GADAVIST GADOBUTROL 1 MMOL/ML IV SOLN

[Series 5: T2 · sagittal · 4.0mm · 0.81mm/px · 7 of 16 slices shown]
[im 1/16]
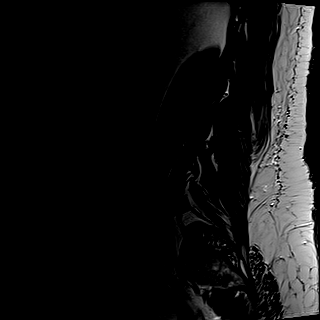
[im 3/16]
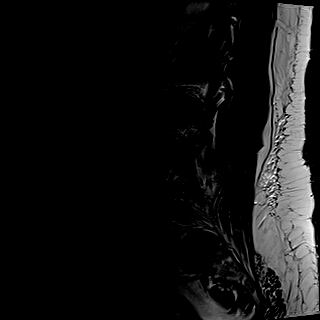
[im 6/16]
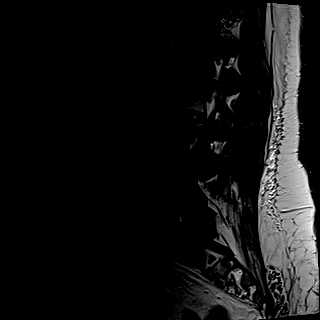
[im 8/16]
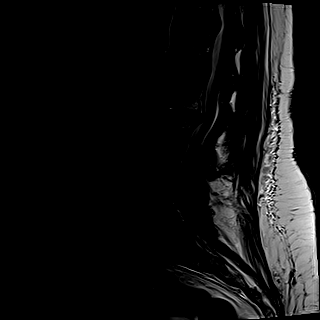
[im 11/16]
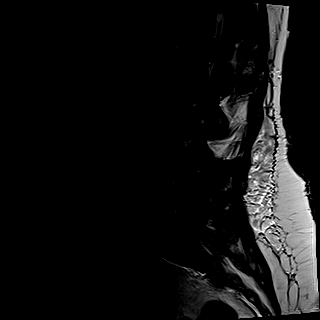
[im 13/16]
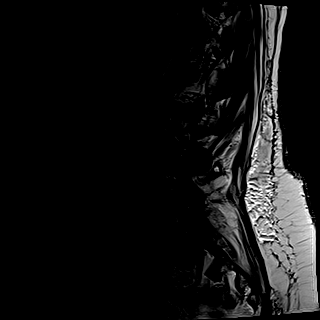
[im 16/16]
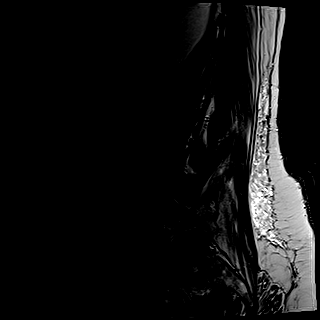

[Series 6: T1 fat-sat post-contrast · sagittal · 4.0mm · 0.81mm/px · 8 of 16 slices shown (1 of 2)]
[im 1/16]
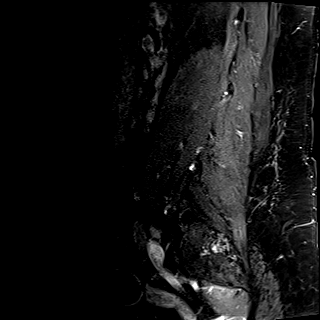
[im 3/16]
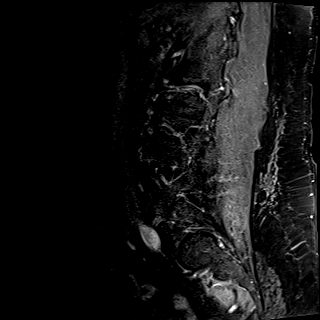
[im 5/16]
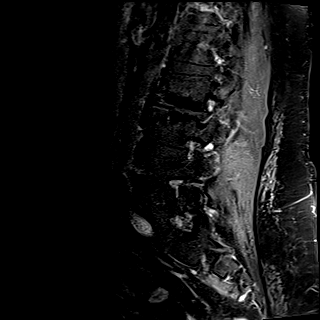
[im 7/16]
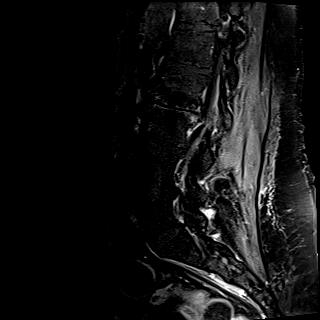
[im 9/16]
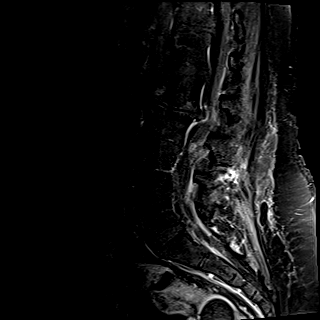
[im 11/16]
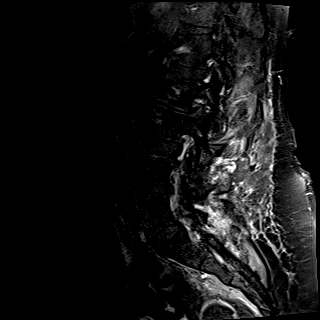
[im 13/16]
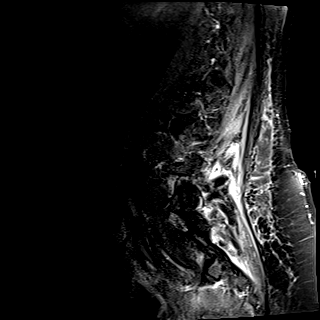
[im 16/16]
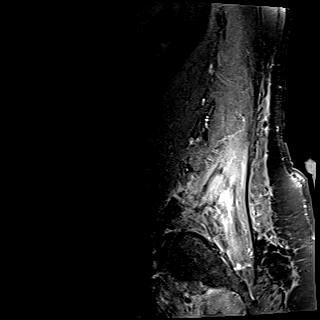

[Series 7: T1 post-contrast · axial · 4.0mm · 0.43mm/px · z∈[-97,+99]mm · 9 of 48 slices shown]
[im 3/48]
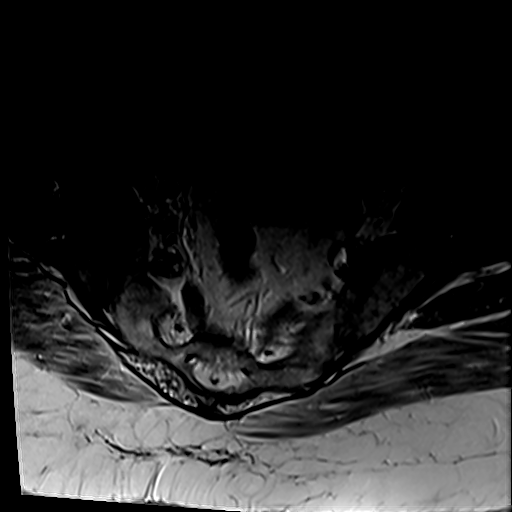
[im 7/48]
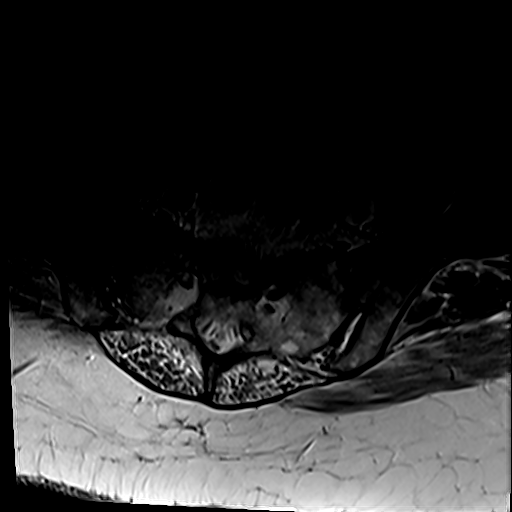
[im 9/48]
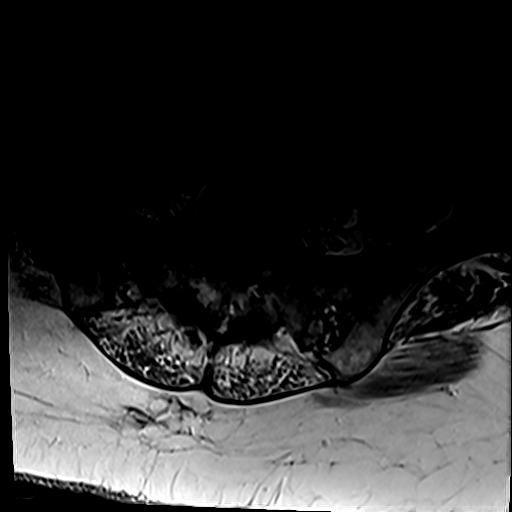
[im 15/48]
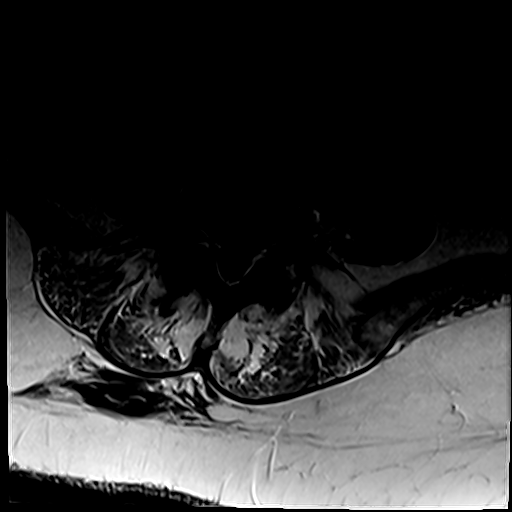
[im 22/48]
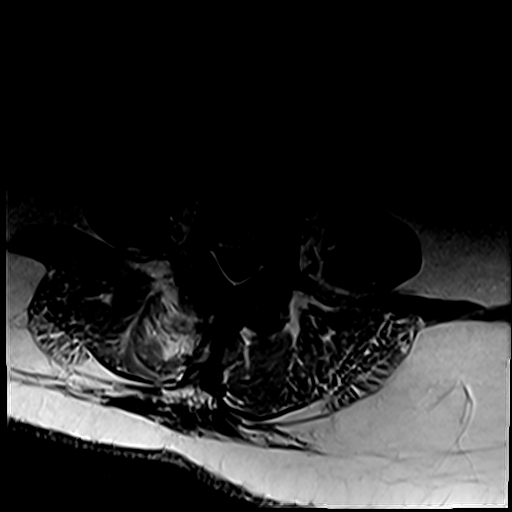
[im 24/48]
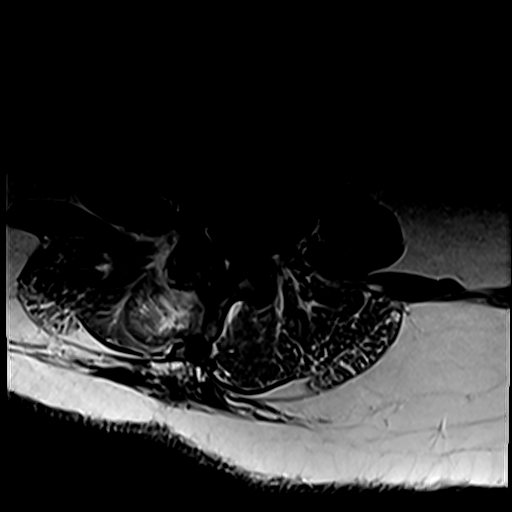
[im 26/48]
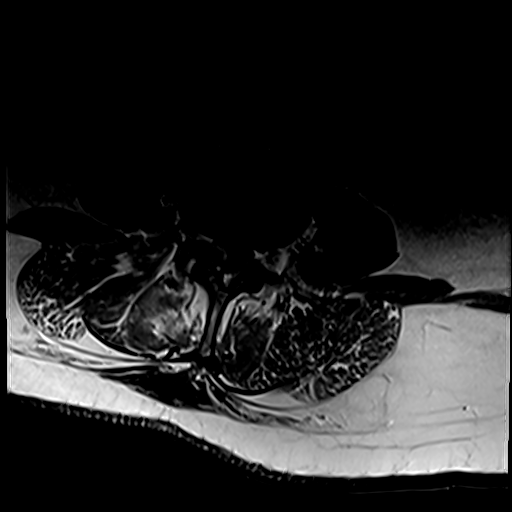
[im 33/48]
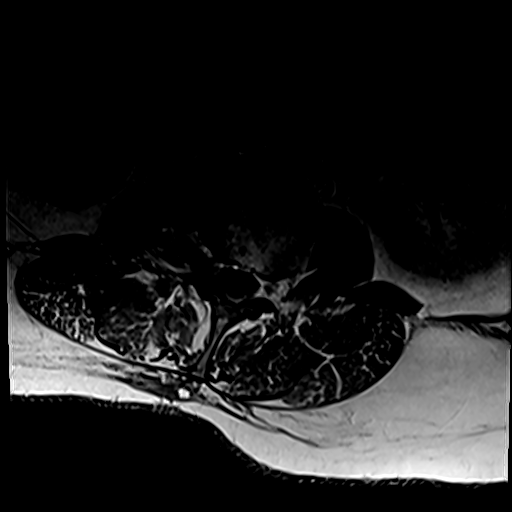
[im 41/48]
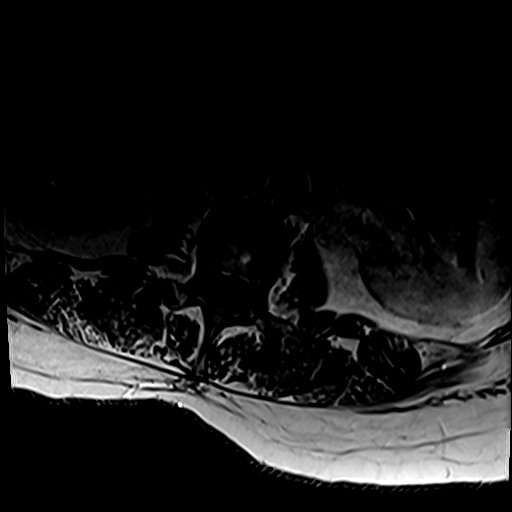

[Series 8: T1 fat-sat post-contrast · coronal · 5.0mm · 0.81mm/px · 8 of 22 slices shown (2 of 2)]
[im 1/22]
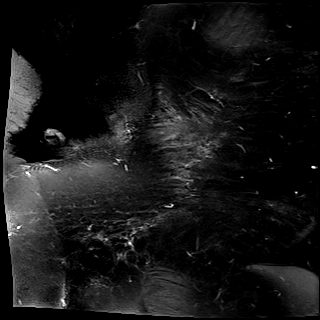
[im 3/22]
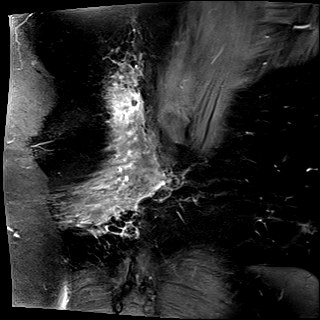
[im 8/22]
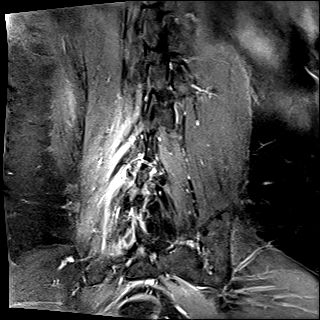
[im 10/22]
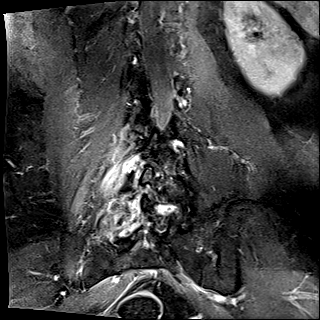
[im 12/22]
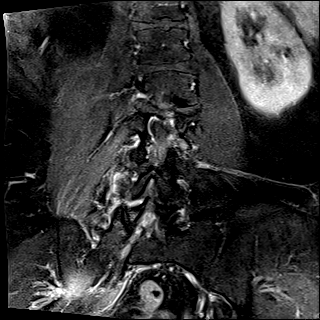
[im 15/22]
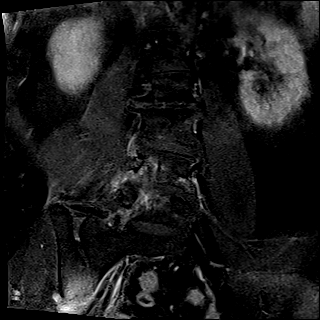
[im 19/22]
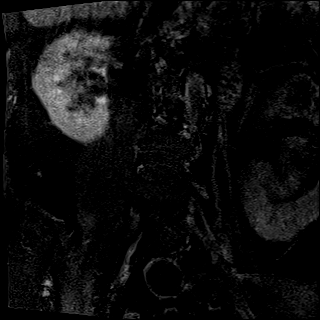
[im 22/22]
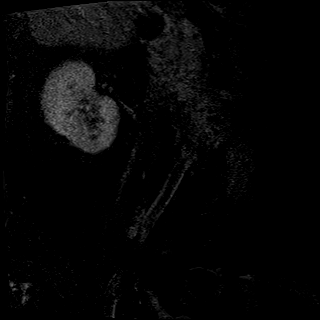

[32 of 48 positions shown; findings below may reference images not displayed]

FINDINGS: Postcontrast imaging confirms posterior epidural fluid collections
bilaterally at the L5-S1 and S1 level. These show peripheral
enhancement and are suspicious for epidural abscess. Additional
ventral epidural fluid collection anterior to the thecal sac at the
L5 level also is suspicious for abscess. This exerts mass-effect on
the thecal sac with moderate spinal stenosis.

There is asymmetric abnormal enhancement in the posterior
paraspinous muscles on the right at L4-5 and L5-S1 suggestive of
infection. There is a rim enhancing fluid collection posterior to
the L5-S1 facet joint which could represent an abscess. This could
be arising from the L4-5 or L5-S1 facet joint. There is degenerative
change in the facet joints bilaterally at L4-5 and L5-S1.

No evidence of discitis.

Multilevel degenerative change throughout the lumbar spine as
described on the prior study. There is moderate spinal stenosis L4-5
and moderate to severe spinal stenosis at L5-S1.
IMPRESSION: Findings are very suspicious for lumbar spinal infection on the
right. There are ventral and posterior fluid collections around the
L5-S1 disc space compatible with epidural abscess. There is soft
tissue enhancement on the right posterior to the L4-5 and L5-S1
facet joints which may be infected. There is a 1 cm fluid collection
posterior to the facet joints on the right which could be an
abscess.

## 2020-09-12 IMAGING — CT CT L SPINE W/O CM
3 of 5 series · 13 of 35 positions shown, 16 images · non-contrast
Comparison: MRI lumbar spine without contrast [DATE]

CLINICAL DATA: Low back pain. Suspect infection. Pain radiating
down leg.

EXAM:
CT LUMBAR SPINE WITHOUT CONTRAST
TECHNIQUE: Multidetector CT imaging of the lumbar spine was performed without
intravenous contrast administration. Multiplanar CT image
reconstructions were also generated.

[Series 4: l spine st · axial · 0.47mm/px · z∈[+1120,+1266]mm · 5 of 111 slices shown, 7 images]
[im 19/111  soft-tissue]
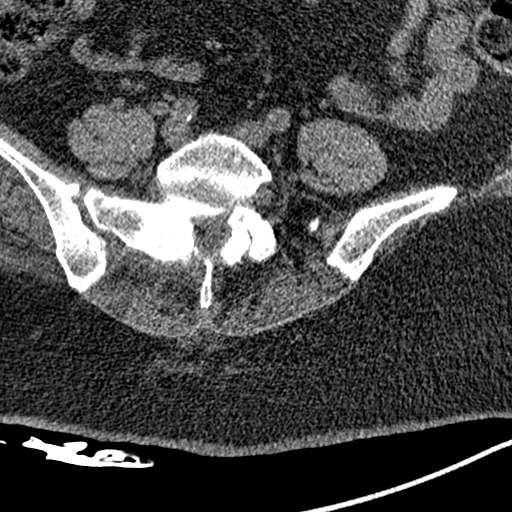
[im 19/111  bone]
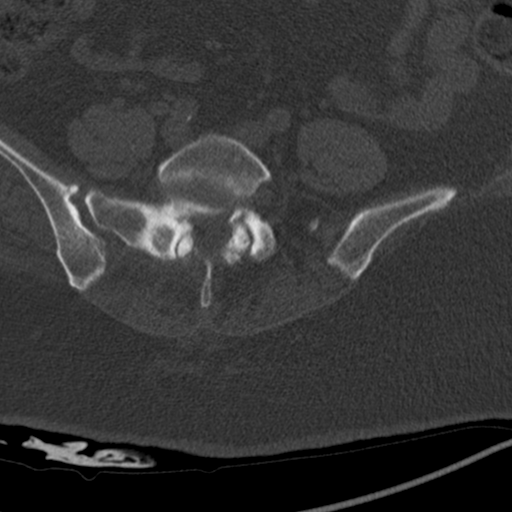
[im 37/111  bone]
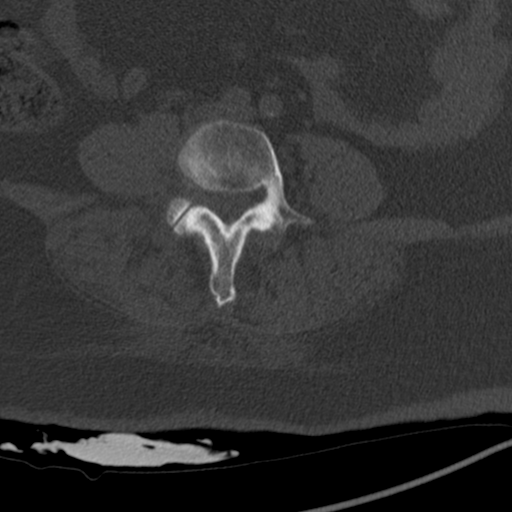
[im 56/111  bone]
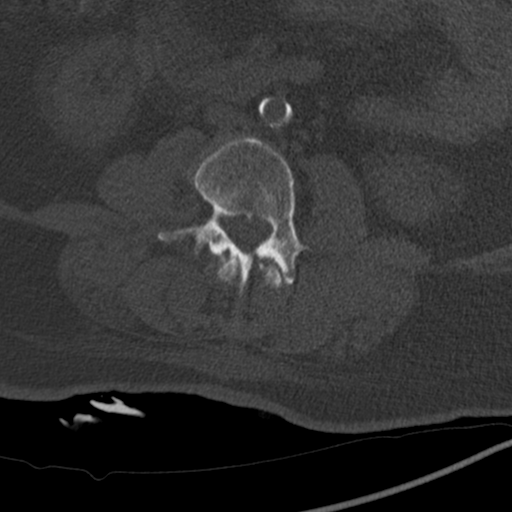
[im 74/111  bone]
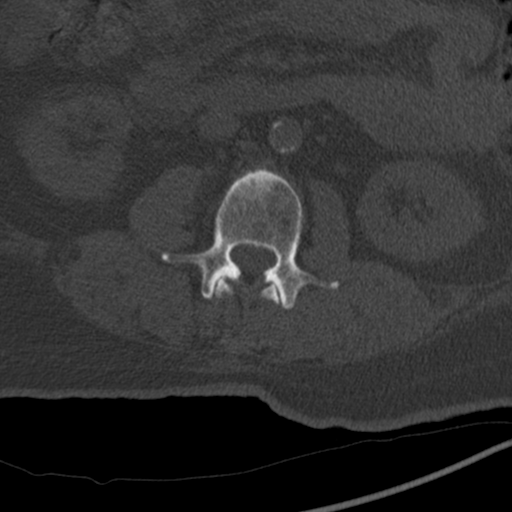
[im 92/111  soft-tissue]
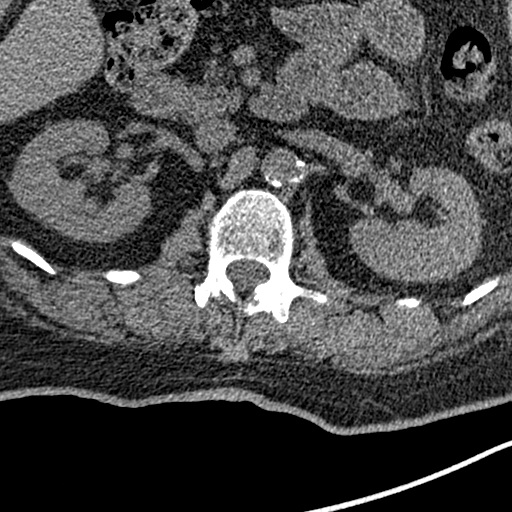
[im 92/111  bone]
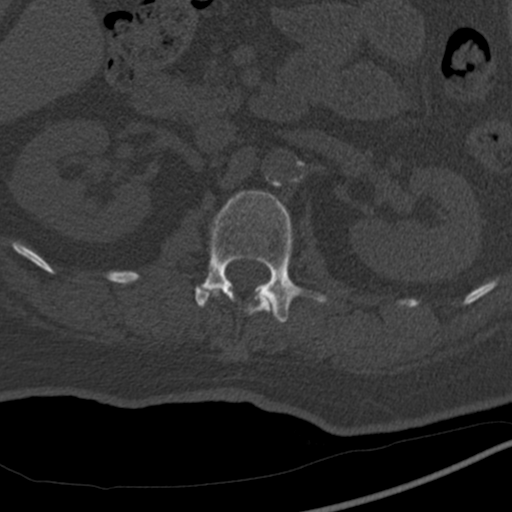

[Series 10: sagittal st · sagittal · 0.35mm/px · 5 of 83 slices shown, 6 images]
[im 28/83  bone]
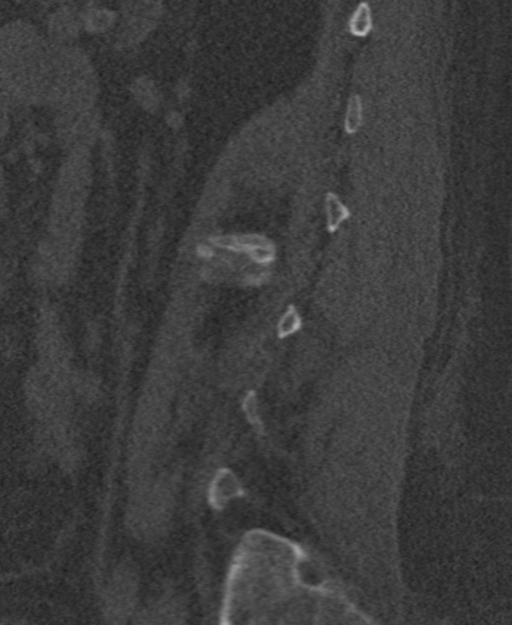
[im 35/83  bone]
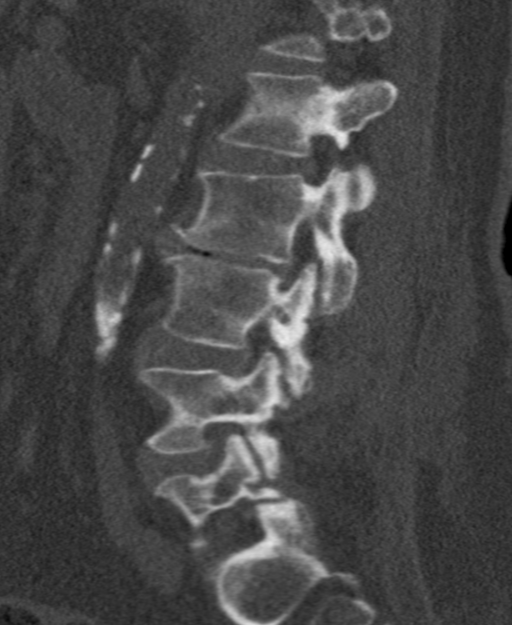
[im 42/83  soft-tissue]
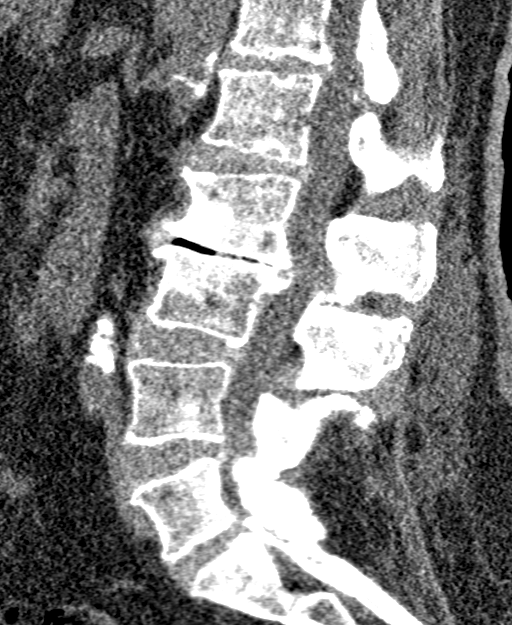
[im 42/83  bone]
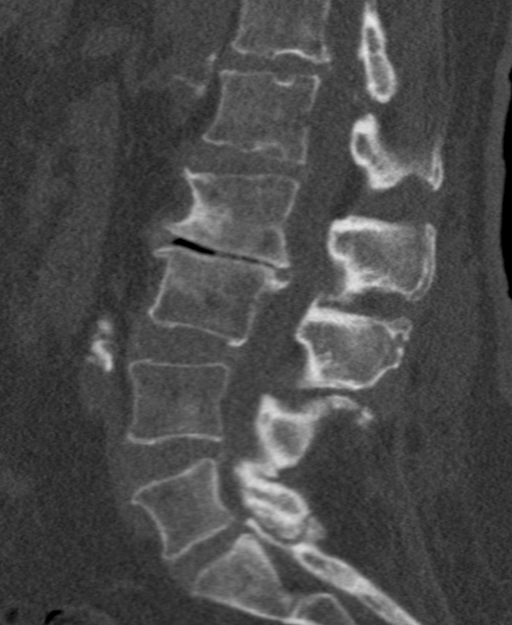
[im 48/83  bone]
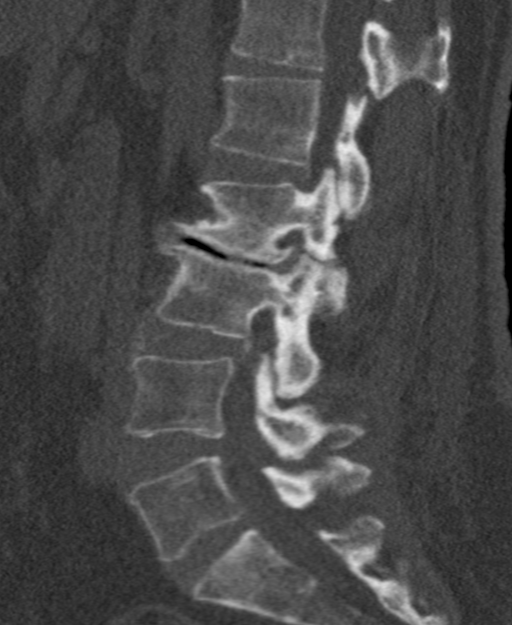
[im 55/83  bone]
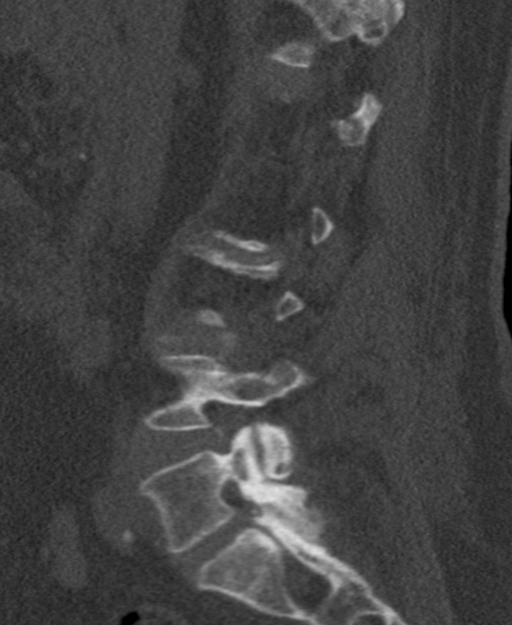

[Series 11: coronal bone · coronal · 0.41mm/px · 3 of 297 slices shown]
[im 75/297  bone]
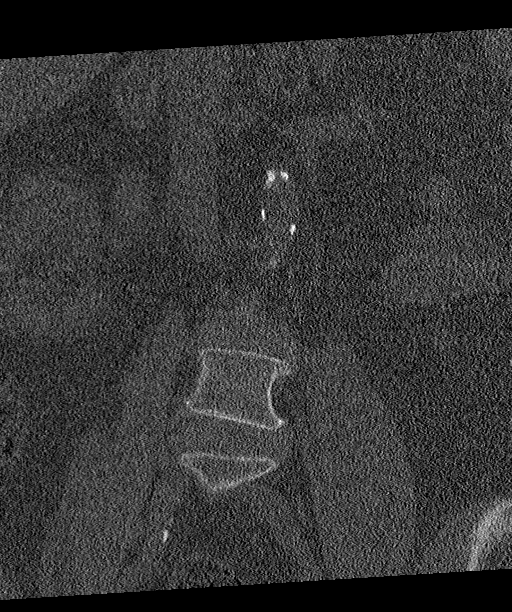
[im 149/297  bone]
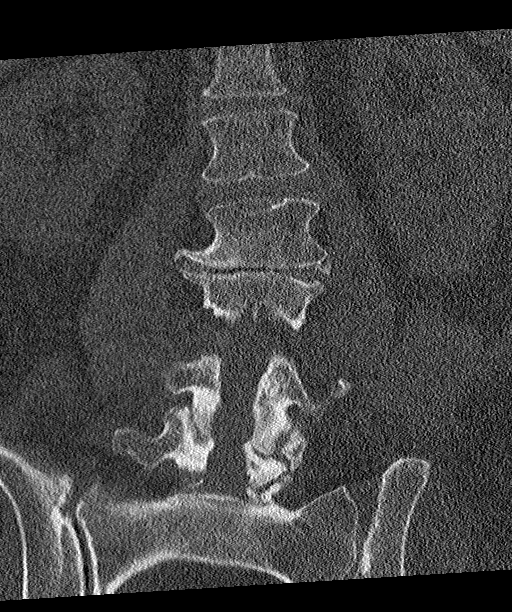
[im 223/297  bone]
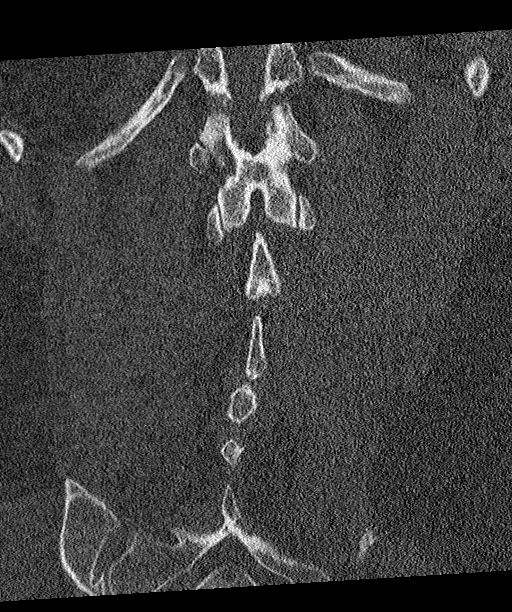

[13 of 35 positions shown; findings below may reference images not displayed]

FINDINGS: Segmentation: Normal

Alignment: Slight retrolisthesis L4-5. Mild levoscoliosis.

Vertebrae: Negative for fracture or mass. No bony erosion of the
endplates.

Paraspinal and other soft tissues: Negative for paraspinous mass,
adenopathy, or fluid collection. Psoas muscle symmetric in size
bilaterally. Atherosclerotic disease without aortic aneurysm.

Disc levels: T12-L1: Disc degeneration with Schmorl's node and left
paracentral disc protrusion which is partially calcified. Mild
spinal stenosis on the left.

L1-2: Diffuse disc bulging and bilateral facet degeneration. No
significant spinal or foraminal stenosis

L2-3: Advanced disc degeneration asymmetric to the right. Disc space
narrowing and spurring asymmetric to the right. Moderate to severe
subarticular and foraminal stenosis on the right due to spurring.
Mild to moderate spinal stenosis. Left foramen patent.

L3-4: Disc degeneration with disc bulging. Bilateral facet
degeneration. Mild spinal stenosis. Moderate right subarticular
stenosis.

L4-5: Diffuse bulging of the disc. Bilateral facet hypertrophy. Mild
spinal stenosis. Moderate subarticular stenosis bilaterally. No bony
erosive changes in the facet joints. Soft tissue fluid collection
posterior and inferior to the right L4-5 facet joint seen on MRI not
visualized by CT.

L5-S1: Normal disc space. Bilateral facet degeneration right greater
than left with bony hypertrophy of the facet joints. No bony erosive
changes. Moderate subarticular stenosis bilaterally. Epidural fluid
collection at this level seen on MRI not visualized by CT.
IMPRESSION: Multilevel degenerative change throughout the lumbar spine as above

MRI findings suspicious for infection. These are best seen by MRI
not well visualized by CT. Recommend follow-up MRI lumbar spine with
contrast to evaluate for epidural abscess and abnormal enhancement.

## 2020-09-12 MED ORDER — FENTANYL CITRATE (PF) 100 MCG/2ML IJ SOLN
12.5000 ug | Freq: Once | INTRAMUSCULAR | Status: AC
  Administered 2020-09-12: 12.5 ug via INTRAVENOUS
  Filled 2020-09-12: qty 2

## 2020-09-12 MED ORDER — ACETAMINOPHEN 325 MG PO TABS
650.0000 mg | ORAL_TABLET | Freq: Four times a day (QID) | ORAL | Status: DC | PRN
  Administered 2020-09-12 – 2020-09-14 (×3): 650 mg via ORAL
  Filled 2020-09-12 (×3): qty 2

## 2020-09-12 MED ORDER — DIAZEPAM 5 MG/ML IJ SOLN
2.5000 mg | Freq: Once | INTRAMUSCULAR | Status: AC
  Administered 2020-09-12: 2.5 mg via INTRAVENOUS
  Filled 2020-09-12: qty 2

## 2020-09-12 MED ORDER — ACETAMINOPHEN 650 MG RE SUPP: 650.0000 mg | Freq: Four times a day (QID) | RECTAL | Status: DC | PRN

## 2020-09-12 MED ORDER — METHOCARBAMOL 500 MG PO TABS: 1000.0000 mg | ORAL_TABLET | Freq: Once | ORAL | Status: DC

## 2020-09-12 MED ORDER — POTASSIUM CHLORIDE 10 MEQ/100ML IV SOLN
10.0000 meq | INTRAVENOUS | Status: AC
  Administered 2020-09-12 (×4): 10 meq via INTRAVENOUS
  Filled 2020-09-12 (×4): qty 100

## 2020-09-12 MED ORDER — LORAZEPAM 2 MG/ML IJ SOLN
0.5000 mg | Freq: Once | INTRAMUSCULAR | Status: AC
  Administered 2020-09-12: 0.5 mg via INTRAVENOUS
  Filled 2020-09-12: qty 1

## 2020-09-12 MED ORDER — INSULIN ASPART 100 UNIT/ML ~~LOC~~ SOLN
0.0000 [IU] | Freq: Three times a day (TID) | SUBCUTANEOUS | Status: DC
  Administered 2020-09-13 (×2): 2 [IU] via SUBCUTANEOUS
  Filled 2020-09-12: qty 0.09

## 2020-09-12 MED ORDER — METHOCARBAMOL 1000 MG/10ML IJ SOLN
500.0000 mg | Freq: Once | INTRAVENOUS | Status: AC
  Administered 2020-09-12: 500 mg via INTRAVENOUS
  Filled 2020-09-12: qty 500

## 2020-09-12 MED ORDER — ONDANSETRON HCL 4 MG PO TABS: 4.0000 mg | ORAL_TABLET | Freq: Four times a day (QID) | ORAL | Status: DC | PRN

## 2020-09-12 MED ORDER — KETOROLAC TROMETHAMINE 15 MG/ML IJ SOLN
15.0000 mg | Freq: Once | INTRAMUSCULAR | Status: AC
  Administered 2020-09-12: 15 mg via INTRAVENOUS
  Filled 2020-09-12: qty 1

## 2020-09-12 MED ORDER — LEVOTHYROXINE SODIUM 75 MCG PO TABS
75.0000 ug | ORAL_TABLET | Freq: Every day | ORAL | Status: DC
  Administered 2020-09-13 – 2020-09-24 (×12): 75 ug via ORAL
  Filled 2020-09-12 (×12): qty 1

## 2020-09-12 MED ORDER — ONDANSETRON HCL 4 MG/2ML IJ SOLN
4.0000 mg | Freq: Four times a day (QID) | INTRAMUSCULAR | Status: DC | PRN
  Administered 2020-09-15 – 2020-09-16 (×2): 4 mg via INTRAVENOUS
  Filled 2020-09-12 (×2): qty 2

## 2020-09-12 MED ORDER — METHOCARBAMOL 1000 MG/10ML IJ SOLN: 500.0000 mg | Freq: Once | INTRAMUSCULAR | Status: DC

## 2020-09-12 MED ORDER — INSULIN ASPART 100 UNIT/ML ~~LOC~~ SOLN
0.0000 [IU] | Freq: Every day | SUBCUTANEOUS | Status: DC
  Filled 2020-09-12: qty 0.05

## 2020-09-12 MED ORDER — HYDRALAZINE HCL 20 MG/ML IJ SOLN
10.0000 mg | Freq: Four times a day (QID) | INTRAMUSCULAR | Status: DC | PRN
  Administered 2020-09-12 – 2020-09-19 (×3): 10 mg via INTRAVENOUS
  Filled 2020-09-12 (×3): qty 1

## 2020-09-12 MED ORDER — GADOBUTROL 1 MMOL/ML IV SOLN
10.0000 mL | Freq: Once | INTRAVENOUS | Status: AC | PRN
  Administered 2020-09-12: 10 mL via INTRAVENOUS

## 2020-09-12 MED ORDER — ENOXAPARIN SODIUM 40 MG/0.4ML ~~LOC~~ SOLN: 40.0000 mg | SUBCUTANEOUS | Status: DC

## 2020-09-12 NOTE — ED Notes (Signed)
[facility] called at this time. 

## 2020-09-12 NOTE — ED Notes (Signed)
Pt transported to [facility] via [facility] 

## 2020-09-12 NOTE — ED Notes (Signed)
Bladder scan 565ml in bladder

## 2020-09-12 NOTE — ED Provider Notes (Signed)
1120:  Discussed with neurosurgeon Dr.Dawley.  He recommended MRI lumbar spine without contrast, CT lumbar spine plain, ESR, CRP, transfer to [facility] for further evaluation and consultation.  Discussed with hospitalist.   Nat Christen, MD 09/12/20 1123

## 2020-09-12 NOTE — Plan of Care (Signed)

## 2020-09-12 NOTE — ED Notes (Signed)
Called the Lab and spoke with Ubaldo Glassing and requested add on of Hepatic Function Panel to Light green tube already in the lab for this patient.

## 2020-09-12 NOTE — ED Notes (Signed)
ED TO INPATIENT HANDOFF REPORT  ED Nurse Name and Phone #: Shawna Clamp RN [phone]  S Name/Age/Gender Brittany Silva 64 y.o. female Room/Bed: WA17/WA17  Code Status   Code Status: Full Code  Home/SNF/Other Home Patient oriented to: self, place, time and situation Is this baseline? 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 Is alert and oriented from home she arived by EMS.  Severe back pain for several days that radiates down her leg.  Hx: degenerative back disease, diabetes UTI on antibiotics for treatment 3 days now Reports urine is still dark   cbg 105      Allergies Allergies  Allergen Reactions  . Hydrochlorothiazide     rash  . Lisinopril     Other reaction(s): Cough  . Adhesive [Tape]   . Codeine Phosphate     REACTION: throat swelling  . Darvon Other (See Comments)    Jittering, skin problems  . Darvon [Propoxyphene Hcl]     Hyperactivity; "makes me crazy"  . Hydrocodone-Acetaminophen Itching    REACTION: unspecified, per pt throat itch and feel strange and also vomit  . Propoxyphene Hcl     REACTION: Makes her crazy  . Latex Rash  . Neosporin [Neomycin-Bacitracin Zn-Polymyx] Rash  . Sulfamethoxazole Itching and Rash    Level of Care/Admitting Diagnosis ED Disposition    ED Disposition Condition [facility] Area: [place] [[id number]]  Level of Care: Med-Surg [16]  May admit patient to [facility] or [facility] if equivalent level of care is available:: No  Covid Evaluation: Asymptomatic Screening Protocol (No Symptoms)  Diagnosis: Abscess in epidural space of lumbar spine [757204]  Admitting Physician: Jonnie Finner [[id number]]  Attending Physician: Jonnie Finner [[id number]]  Estimated length of stay: past midnight tomorrow  Certification:: I certify this patient will need inpatient services for at least 2 midnights        B Medical/Surgery History Past Medical History:  Diagnosis Date  . Ankle fracture    left  . Anxiety    chronic  . Cervical back pain with evidence of disc disease   . Diabetes mellitus   . Dyslipidemia   . Edema    lower extremity  . GERD (gastroesophageal reflux disease)   . Hyperlipidemia   . Hypertension   . Hypothyroid   . Obstructive apnea    Split study 05-22-12 - patient was too claustrophobic and did not persue CPAP.   Marland Kitchen Osteoarthritis   . PVC's (premature ventricular contractions)   . Retinopathy    diabetic, mild, nonproliferative bilateral  . TSH (thyroid-stimulating hormone deficiency)    evaluation off RX in 4/14  . Vitamin D deficiency    Past Surgical History:  Procedure Laterality Date  . ABDOMINAL HYSTERECTOMY    . CATARACT EXTRACTION Bilateral 2012  . EYE SURGERY Right 06/26/11   cataract removal Rt eye  . FOOT SURGERY     Ankle fracture  . index finger surgery  2007   spider bite  . ORIF ANKLE FRACTURE Left [id number]  . Right knee surgery  1966   torn cartilage  . TONSILLECTOMY  1962   adenoidectomy  . TUBAL LIGATION Bilateral 1986     A IV Location/Drains/Wounds Patient Lines/Drains/Airways Status    Active Line/Drains/Airways    Name Placement date Placement time Site Days   Peripheral IV 09/12/20 Left Hand 09/12/20  0721  Hand  less than 1   External Urinary Catheter 09/11/20  2340  --  1          Intake/Output Last 24 hours No intake or output data in the 24 hours ending 09/12/20 1820  Labs/Imaging Results for orders placed or performed during the hospital encounter of 09/11/20 (from the past 48 hour(s))  CBG monitoring, ED     Status: None   Collection Time: 09/11/20 10:37 PM  Result Value Ref Range   Glucose-Capillary 73 70 - 99 mg/dL    Comment: Glucose reference range applies only to samples taken after fasting for at least 8 hours.  Basic metabolic panel     Status: Abnormal   Collection Time: 09/11/20 10:39 PM   Result Value Ref Range   Sodium 134 (L) 135 - 145 mmol/L   Potassium 3.1 (L) 3.5 - 5.1 mmol/L   Chloride 97 (L) 98 - 111 mmol/L   CO2 28 22 - 32 mmol/L   Glucose, Bld 76 70 - 99 mg/dL    Comment: Glucose reference range applies only to samples taken after fasting for at least 8 hours.   BUN 17 8 - 23 mg/dL   Creatinine, Ser 0.89 0.44 - 1.00 mg/dL   Calcium 8.1 (L) 8.9 - 10.3 mg/dL   GFR, Estimated >60 >60 mL/min   Anion gap 9 5 - 15    Comment: Performed at [facility], [place] [address], [place], [place] [address]  CBC     Status: Abnormal   Collection Time: 09/11/20 10:39 PM  Result Value Ref Range   WBC 10.7 (H) 4.0 - 10.5 K/uL   RBC 4.40 3.87 - 5.11 MIL/uL   Hemoglobin 12.7 12.0 - 15.0 g/dL   HCT 38.4 36 - 46 %   MCV 87.3 80.0 - 100.0 fL   MCH 28.9 26.0 - 34.0 pg   MCHC 33.1 30.0 - 36.0 g/dL   RDW 13.8 11.5 - 15.5 %   Platelets 265 150 - 400 K/uL   nRBC 0.0 0.0 - 0.2 %    Comment: Performed at [facility], [place] [address], [place], [place] [address]  Hepatic function panel     Status: Abnormal   Collection Time: 09/11/20 11:59 PM  Result Value Ref Range   Total Protein 6.9 6.5 - 8.1 g/dL   Albumin 2.8 (L) 3.5 - 5.0 g/dL   AST 37 15 - 41 U/L   ALT 22 0 - 44 U/L   Alkaline Phosphatase 128 (H) 38 - 126 U/L   Total Bilirubin 0.6 0.3 - 1.2 mg/dL   Bilirubin, Direct 0.2 0.0 - 0.2 mg/dL   Indirect Bilirubin 0.4 0.3 - 0.9 mg/dL    Comment: Performed at [facility], [place] [address], [place], [place] [address]  Acetaminophen level     Status: None   Collection Time: 09/12/20 12:27 AM  Result Value Ref Range   Acetaminophen (Tylenol), Serum 13 10 - 30 ug/mL    Comment: (NOTE) Therapeutic concentrations vary significantly. A range of 10-30 ug/mL  may be an effective concentration for many patients. However, some  are best treated at concentrations outside of this range. Acetaminophen concentrations >150 ug/mL at 4 hours  after ingestion  and >50 ug/mL at 12 hours after ingestion are often associated with  toxic reactions.  Performed at [facility], [place] [address] [place], [place] [address]   Salicylate level     Status: Abnormal  Collection Time: 09/12/20 12:27 AM  Result Value Ref Range   Salicylate Lvl <2.9 (L) 7.0 - 30.0 mg/dL    Comment: Performed at [facility], [place] [address], [place], [place] [address]  Acetaminophen level     Status: None   Collection Time: 09/12/20  2:26 AM  Result Value Ref Range   Acetaminophen (Tylenol), Serum 12 10 - 30 ug/mL    Comment: (NOTE) Therapeutic concentrations vary significantly. A range of 10-30 ug/mL  may be an effective concentration for many patients. However, some  are best treated at concentrations outside of this range. Acetaminophen concentrations >150 ug/mL at 4 hours after ingestion  and >50 ug/mL at 12 hours after ingestion are often associated with  toxic reactions.  Performed at [facility] [place] [address], [place], [place] [address]   Urinalysis, Routine w reflex microscopic Urine, Catheterized     Status: Abnormal   Collection Time: 09/12/20  5:28 AM  Result Value Ref Range   Color, Urine YELLOW YELLOW   APPearance CLEAR CLEAR   Specific Gravity, Urine 1.022 1.005 - 1.030   pH 5.0 5.0 - 8.0   Glucose, UA >=500 (A) NEGATIVE mg/dL   Hgb urine dipstick NEGATIVE NEGATIVE   Bilirubin Urine NEGATIVE NEGATIVE   Ketones, ur NEGATIVE NEGATIVE mg/dL   Protein, ur NEGATIVE NEGATIVE mg/dL   Nitrite NEGATIVE NEGATIVE   Leukocytes,Ua NEGATIVE NEGATIVE   RBC / HPF 0-5 0 - 5 RBC/hpf   WBC, UA 6-10 0 - 5 WBC/hpf   Bacteria, UA RARE (A) NONE SEEN   Squamous Epithelial / LPF 0-5 0 - 5    Comment: Performed at [facility], [place] [address] [place], [place] [address]  Sedimentation rate     Status: Abnormal   Collection Time: 09/12/20 11:32 AM  Result Value Ref Range    Sed Rate 85 (H) 0 - 22 mm/hr    Comment: Performed at [facility], [place] [address], [place], [place] [address]  C-reactive protein     Status: Abnormal   Collection Time: 09/12/20 11:32 AM  Result Value Ref Range   CRP 22.9 (H) <1.0 mg/dL    Comment: Performed at [facility], [place] [address], [place], [place] [address]  Respiratory Panel by RT PCR (Flu A&B, Covid) - Nasopharyngeal Swab     Status: None   Collection Time: 09/12/20  1:43 PM   Specimen: Nasopharyngeal Swab  Result Value Ref Range   SARS Coronavirus 2 by RT PCR NEGATIVE NEGATIVE    Comment: (NOTE) SARS-CoV-2 target nucleic acids are NOT DETECTED.  The SARS-CoV-2 RNA is generally detectable in upper respiratoy specimens during the acute phase of infection. The lowest concentration of SARS-CoV-2 viral copies this assay can detect is 131 copies/mL. A negative result does not preclude SARS-Cov-2 infection and should not be used as the sole basis for treatment or other patient management decisions. A negative result may occur with  improper specimen collection/handling, submission of specimen other than nasopharyngeal swab, presence of viral mutation(s) within the areas targeted by this assay, and inadequate number of viral copies (<131 copies/mL). A negative result must be combined with clinical observations, patient history, and epidemiological information. The expected result is Negative.  Fact Sheet for Patients:  PinkCheek.be  Fact Sheet for Healthcare Providers:  GravelBags.it  This test is no t yet approved or cleared by the [place] FDA and  has been authorized for detection and/or diagnosis of SARS-CoV-2  by FDA under an Emergency Use Authorization (EUA). This EUA will remain  in effect (meaning this test can be used) for the duration of the COVID-19 declaration under Section 564(b)(1) of the Act, 21  U.S.C. section 360bbb-3(b)(1), unless the authorization is terminated or revoked sooner.     Influenza A by PCR NEGATIVE NEGATIVE   Influenza B by PCR NEGATIVE NEGATIVE    Comment: (NOTE) The Xpert Xpress SARS-CoV-2/FLU/RSV assay is intended as an aid in  the diagnosis of influenza from Nasopharyngeal swab specimens and  should not be used as a sole basis for treatment. Nasal washings and  aspirates are unacceptable for Xpert Xpress SARS-CoV-2/FLU/RSV  testing.  Fact Sheet for Patients: PinkCheek.be  Fact Sheet for Healthcare Providers: GravelBags.it  This test is not yet approved or cleared by the [place] FDA and  has been authorized for detection and/or diagnosis of SARS-CoV-2 by  FDA under an Emergency Use Authorization (EUA). This EUA will remain  in effect (meaning this test can be used) for the duration of the  Covid-19 declaration under Section 564(b)(1) of the Act, 21  U.S.C. section 360bbb-3(b)(1), unless the authorization is  terminated or revoked. Performed at [facility], [place] [address], [place], [place] [address]   CBG monitoring, ED     Status: Abnormal   Collection Time: 09/12/20  5:35 PM  Result Value Ref Range   Glucose-Capillary 65 (L) 70 - 99 mg/dL    Comment: Glucose reference range applies only to samples taken after fasting for at least 8 hours.   CT Lumbar Spine Wo Contrast  Result Date: 09/12/2020 CLINICAL DATA:  Low back pain. Suspect infection. Pain radiating down leg. EXAM: CT LUMBAR SPINE WITHOUT CONTRAST TECHNIQUE: Multidetector CT imaging of the lumbar spine was performed without intravenous contrast administration. Multiplanar CT image reconstructions were also generated. COMPARISON:  MRI lumbar spine without contrast 09/12/2020 FINDINGS: Segmentation: Normal Alignment: Slight retrolisthesis L4-5. Mild levoscoliosis. Vertebrae: Negative for fracture or mass. No bony  erosion of the endplates. Paraspinal and other soft tissues: Negative for paraspinous mass, adenopathy, or fluid collection. Psoas muscle symmetric in size bilaterally. Atherosclerotic disease without aortic aneurysm. Disc levels: T12-L1: Disc degeneration with Schmorl's node and left paracentral disc protrusion which is partially calcified. Mild spinal stenosis on the left. L1-2: Diffuse disc bulging and bilateral facet degeneration. No significant spinal or foraminal stenosis L2-3: Advanced disc degeneration asymmetric to the right. Disc space narrowing and spurring asymmetric to the right. Moderate to severe subarticular and foraminal stenosis on the right due to spurring. Mild to moderate spinal stenosis. Left foramen patent. L3-4: Disc degeneration with disc bulging. Bilateral facet degeneration. Mild spinal stenosis. Moderate right subarticular stenosis. L4-5: Diffuse bulging of the disc. Bilateral facet hypertrophy. Mild spinal stenosis. Moderate subarticular stenosis bilaterally. No bony erosive changes in the facet joints. Soft tissue fluid collection posterior and inferior to the right L4-5 facet joint seen on MRI not visualized by CT. L5-S1: Normal disc space. Bilateral facet degeneration right greater than left with bony hypertrophy of the facet joints. No bony erosive changes. Moderate subarticular stenosis bilaterally. Epidural fluid collection at this level seen on MRI not visualized by CT. IMPRESSION: Multilevel degenerative change throughout the lumbar spine as above MRI findings suspicious for infection. These are best seen by MRI not well visualized by CT. Recommend follow-up MRI lumbar spine with contrast to evaluate for epidural abscess and abnormal enhancement. Electronically Signed   By: Franchot Gallo M.D.   On: 09/12/2020 11:44  MR LUMBAR SPINE WO CONTRAST  Result Date: 09/12/2020 CLINICAL DATA:  Low back pain. Right sacroiliac pain radiating to the right leg for 4 days. Inability to  void. EXAM: MRI LUMBAR SPINE WITHOUT CONTRAST TECHNIQUE: Multiplanar, multisequence MR imaging of the lumbar spine was performed. No intravenous contrast was administered. COMPARISON:  None. FINDINGS: Segmentation: Normal lumbar segmentation is assumed with the lowest fully formed disc space designated L5-S1. Alignment: Mild lumbar levoscoliosis. Trace retrolisthesis of L2 on L3, L3 on L4, and L4 on L5. Vertebrae: There is mild chronic anterior wedging of the L2 vertebral body. There is no evidence of discitis. There is right facet joint fluid at L4-5 without significant marrow edema or bone destruction. A fluid collection posterior and inferior to the right L4-5 facet joint measures 1.1 x 1.1 x 1.8 cm. There is abnormal T2 hyperintense material in the ventral and dorsal epidural space at L5-S1 measuring up to 5 mm in thickness and resulting in severe spinal stenosis. Conus medullaris and cauda equina: Conus extends to the T12-L1 level. Conus and cauda equina appear normal. Paraspinal and other soft tissues: Edema in the right-sided posterior paraspinal musculature in the lower lumbar spine. Disc levels: T12-L1: Disc desiccation. Small left paracentral disc extrusion with slight superior migration. No significant stenosis. L1-2: Tiny central disc protrusion, mild left eccentric disc bulging, and mild facet arthrosis without stenosis. L2-3: Disc desiccation and severe disc space narrowing. Right eccentric disc bulging, endplate spurring, disc space height loss, and moderate facet and ligamentum flavum hypertrophy result in moderate spinal stenosis, moderate to severe bilateral lateral recess stenosis, and severe right neural foraminal stenosis. Potential right L2 and bilateral L3 nerve root impingement. L3-4: Disc desiccation. Disc bulging and severe facet and ligamentum flavum hypertrophy result in mild-to-moderate spinal stenosis, moderate bilateral lateral recess stenosis, and mild bilateral neural foraminal  stenosis. L4-5: Disc desiccation. Disc bulging and severe facet and ligamentum flavum hypertrophy result in moderate spinal stenosis, moderate left greater than right lateral recess stenosis, and moderate left greater than right neural foraminal stenosis. L5-S1: Normal disc. Bilateral facet arthrosis. Severe spinal stenosis due to the above described epidural process. Patent neural foramina. IMPRESSION: 1. Abnormal epidural process at L5-S1 most consistent with infectious phlegmon or abscess resulting in severe spinal stenosis. 2. Right-sided facet joint fluid at L4-5 without significant marrow edema or bone destruction, indeterminate for septic facet arthritis although there is a small fluid collection posterior and inferior to the facet joint which could reflect an abscess. 3. Right-sided posterior paraspinal muscle edema which may reflect myositis. 4. Advanced disc degeneration at L2-3 with moderate spinal stenosis and moderate to severe lateral recess stenosis. 5. Moderate lateral recess stenosis at L3-4 and L4-5. Electronically Signed   By: Logan Bores M.D.   On: 09/12/2020 09:26   MR LUMBAR SPINE W CONTRAST  Result Date: 09/12/2020 CLINICAL DATA:  Numbness and tingling, paresthesia. Concern for spinal infection on unenhanced MRI. Diabetes. Leukocytosis. EXAM: MRI LUMBAR SPINE WITH CONTRAST CONTRAST:  35mL GADAVIST GADOBUTROL 1 MMOL/ML IV SOLN COMPARISON:  MRI lumbar spine without contrast 09/12/2020. CT lumbar spine 09/12/2020 FINDINGS: Postcontrast imaging confirms posterior epidural fluid collections bilaterally at the L5-S1 and S1 level. These show peripheral enhancement and are suspicious for epidural abscess. Additional ventral epidural fluid collection anterior to the thecal sac at the L5 level also is suspicious for abscess. This exerts mass-effect on the thecal sac with moderate spinal stenosis. There is asymmetric abnormal enhancement in the posterior paraspinous muscles on the right at L4-5  and  L5-S1 suggestive of infection. There is a rim enhancing fluid collection posterior to the L5-S1 facet joint which could represent an abscess. This could be arising from the L4-5 or L5-S1 facet joint. There is degenerative change in the facet joints bilaterally at L4-5 and L5-S1. No evidence of discitis. Multilevel degenerative change throughout the lumbar spine as described on the prior study. There is moderate spinal stenosis L4-5 and moderate to severe spinal stenosis at L5-S1. IMPRESSION: Findings are very suspicious for lumbar spinal infection on the right. There are ventral and posterior fluid collections around the L5-S1 disc space compatible with epidural abscess. There is soft tissue enhancement on the right posterior to the L4-5 and L5-S1 facet joints which may be infected. There is a 1 cm fluid collection posterior to the facet joints on the right which could be an abscess. Electronically Signed   By: Franchot Gallo M.D.   On: 09/12/2020 12:20    Pending Labs Unresulted Labs (From admission, onward)          Start     Ordered   09/19/20 0500  Creatinine, serum  (enoxaparin (LOVENOX)    CrCl >/= 30 ml/min)  Weekly,   R     Comments: while on enoxaparin therapy    09/12/20 1643   09/13/20 0500  Comprehensive metabolic panel  Tomorrow morning,   R        09/12/20 1643   09/13/20 0500  CBC  Tomorrow morning,   R        09/12/20 1643   09/12/20 1644  HIV Antibody (routine testing w rflx)  (HIV Antibody (Routine testing w reflex) panel)  Once,   STAT        09/12/20 1643   09/12/20 1644  CBC  (enoxaparin (LOVENOX)    CrCl >/= 30 ml/min)  Once,   STAT       Comments: Baseline for enoxaparin therapy IF NOT ALREADY DRAWN.  Notify MD if PLT < 100 K.    09/12/20 1643   09/12/20 1644  Creatinine, serum  (enoxaparin (LOVENOX)    CrCl >/= 30 ml/min)  Once,   STAT       Comments: Baseline for enoxaparin therapy IF NOT ALREADY DRAWN.    09/12/20 1643   09/12/20 1644  Magnesium  Once,   STAT         09/12/20 1643   09/12/20 1644  Hemoglobin A1c  Once,   STAT       Comments: To assess prior glycemic control    09/12/20 1643          Vitals/Pain Today's Vitals   09/12/20 1630 09/12/20 1657 09/12/20 1800 09/12/20 1817  BP: (!) 193/77 (!) 184/88 (!) 161/113   Pulse: 75  85 84  Resp: 15  11 16   Temp:      TempSrc:      SpO2: 94%  100% 99%  PainSc:        Isolation Precautions No active isolations  Medications Medications  acetaminophen (TYLENOL) tablet 650 mg (has no administration in time range)    Or  acetaminophen (TYLENOL) suppository 650 mg (has no administration in time range)  ondansetron (ZOFRAN) tablet 4 mg (has no administration in time range)    Or  ondansetron (ZOFRAN) injection 4 mg (has no administration in time range)  hydrALAZINE (APRESOLINE) injection 10 mg (10 mg Intravenous Given 09/12/20 1659)  insulin aspart (novoLOG) injection 0-9 Units (0 Units Subcutaneous Not Given 09/12/20 1738)  insulin aspart (novoLOG) injection 0-5 Units (has no administration in time range)  levothyroxine (SYNTHROID) tablet 75 mcg (has no administration in time range)  fentaNYL (SUBLIMAZE) injection 12.5 mcg (has no administration in time range)  diazepam (VALIUM) injection 2.5 mg (2.5 mg Intravenous Given 09/12/20 0721)  ketorolac (TORADOL) 15 MG/ML injection 15 mg (15 mg Intravenous Given 09/12/20 0721)  methocarbamol (ROBAXIN) 500 mg in dextrose 5 % 50 mL IVPB (0 mg Intravenous Stopped 09/12/20 0752)  LORazepam (ATIVAN) injection 0.5 mg (0.5 mg Intravenous Given 09/12/20 1135)  gadobutrol (GADAVIST) 1 MMOL/ML injection 10 mL (10 mLs Intravenous Contrast Given 09/12/20 1150)  potassium chloride 10 mEq in 100 mL IVPB (0 mEq Intravenous Stopped 09/12/20 1818)    Mobility walks with device High fall risk   Focused Assessments Neuro Assessment Handoff:  Swallow screen pass? Yes          Neuro Assessment:   Neuro Checks:      Last Documented NIHSS Modified  Score:   Has TPA been given? No If patient is a Neuro Trauma and patient is going to OR before floor call report to [facility] nurse: [phone] or [phone]     R Recommendations: See Admitting Provider Note  Report given to:   Additional Notes:

## 2020-09-12 NOTE — ED Notes (Signed)
Pt transported to MRI at this time 

## 2020-09-12 NOTE — Telephone Encounter (Signed)
Post ED Visit - Positive Culture Follow-up  Culture report reviewed by antimicrobial stewardship pharmacist: [place] Team []  Elenor Quinones, Pharm.D. []  Heide Guile, Pharm.D., BCPS AQ-ID []  Parks Neptune, Pharm.D., BCPS []  Alycia Rossetti, Pharm.D., BCPS []  [place], [place].D., BCPS, AAHIVP []  Legrand Como, Pharm.D., BCPS, AAHIVP []  Salome Arnt, PharmD, BCPS []  Johnnette Gourd, PharmD, BCPS []  Hughes Better, PharmD, BCPS []  Leeroy Cha, PharmD []  Laqueta Linden, PharmD, BCPS []  Albertina Parr, PharmD  [place] Team []  Leodis Sias, PharmD []  Lindell Spar, PharmD []  Royetta Asal, PharmD []  Graylin Shiver, Rph []  Rema Fendt) Glennon Mac, PharmD []  Arlyn Dunning, PharmD []  Netta Cedars, PharmD []  Dia Sitter, PharmD []  Leone Haven, PharmD []  Gretta Arab, PharmD []  Theodis Shove, PharmD []  Peggyann Juba, PharmD []  Reuel Boom, PharmD   Positive urine culture Treated with Cephalexin, organism sensitive to the same and no further patient follow-up is required at this time. Dimple Nanas, PharmD  Harlon Flor Talley 09/12/2020, 11:36 AM

## 2020-09-12 NOTE — ED Notes (Signed)
Hospitalist at bedside 

## 2020-09-12 NOTE — ED Notes (Signed)
Pt is requesting pain medication. DO made aware at this time

## 2020-09-12 NOTE — H&P (Signed)
History and Physical    Brittany Silva PZW:[id number] DOB: 1956/05/30 DOA: 09/11/2020  PCP: Carol Ada, MD  Patient coming from: Home  Chief Complaint: back pain, N/V  HPI: Brittany Silva is a 64 y.o. female with medical history significant of DM2, HTN, hypothyroidism. Presenting with 5 days of back/leg pain, N/V. Pain started in the right buttock on Thursday while resting. It was sharp and radiated down to the right knee. She tried APAP and heating pads but didn't work. She also had N/V during this time. These symptoms continued through Friday. She went to the ED and was told she had a UTI. She was sent home with antibiotics. The was only able to take her abx for a day or two and it did not improve her situation. Her pain/N/V worsened and she had decreased urination. Out of concern for worsening condition, she returned to the ED last night.   ED Course: MRI showed possible epidural abscess. Neurosurgery was consulted. Recommended transfer to [facility] for further eval. TRH was called for admission.   Review of Systems: Denies F, CP, dyspnea, palpitations, bowel/bladder incontinence. Remainder of 10 point review of systems is otherwise negative for all not mentioned in HPI.   PMHx Past Medical History:  Diagnosis Date  . Ankle fracture    left  . Anxiety    chronic  . Cervical back pain with evidence of disc disease   . Diabetes mellitus   . Dyslipidemia   . Edema    lower extremity  . GERD (gastroesophageal reflux disease)   . Hyperlipidemia   . Hypertension   . Hypothyroid   . Obstructive apnea    Split study 05-22-12 - patient was too claustrophobic and did not persue CPAP.   Marland Kitchen Osteoarthritis   . PVC's (premature ventricular contractions)   . Retinopathy    diabetic, mild, nonproliferative bilateral  . TSH (thyroid-stimulating hormone deficiency)    evaluation off RX in 4/14  . Vitamin D deficiency     PSHx Past Surgical History:  Procedure Laterality Date  .  ABDOMINAL HYSTERECTOMY    . CATARACT EXTRACTION Bilateral 2012  . EYE SURGERY Right 06/26/11   cataract removal Rt eye  . FOOT SURGERY     Ankle fracture  . index finger surgery  2007   spider bite  . ORIF ANKLE FRACTURE Left [id number]  . Right knee surgery  1966   torn cartilage  . TONSILLECTOMY  1962   adenoidectomy  . TUBAL LIGATION Bilateral 1986    SocHx  reports that she has never smoked. She has never used smokeless tobacco. She reports that she does not drink alcohol and does not use drugs.  Allergies  Allergen Reactions  . Hydrochlorothiazide     rash  . Adhesive [Tape]   . Codeine Phosphate     REACTION: throat swelling  . Darvon Other (See Comments)    Jittering, skin problems  . Darvon [Propoxyphene Hcl]     Hyperactivity; "makes me crazy"  . Hydrocodone-Acetaminophen Itching    REACTION: unspecified, per pt throat itch and feel strange and also vomit  . Propoxyphene Hcl     REACTION: Makes her crazy  . Latex Rash  . Neosporin [Neomycin-Bacitracin Zn-Polymyx] Rash  . Sulfamethoxazole Itching and Rash    FamHx Family History  Problem Relation Age of Onset  . Coronary artery disease Father        MI at age 6  . Hypertension Mother   . Thyroid disease Mother   .  Ulcers Sister   . Allergies Sister   . Psoriasis Sister   . Diabetes Maternal Grandfather   . Heart attack Paternal Grandfather   . Colon cancer Neg Hx     Prior to Admission medications   Medication Sig Start Date End Date Taking? Authorizing Provider  acetaminophen (TYLENOL) 500 MG tablet Take 500 mg by mouth as needed.    [provider]  amLODipine (NORVASC) 5 MG tablet Take 1 tablet (5 mg total) by mouth daily. 06/19/12   Barton Fanny, MD  atorvastatin (LIPITOR) 40 MG tablet Take 40 mg by mouth daily.    [provider]  cephALEXin (KEFLEX) 500 MG capsule Take 1 capsule (500 mg total) by mouth 3 (three) times daily. 09/08/20   Quintella Reichert, MD  escitalopram  (LEXAPRO) 10 MG tablet  04/20/14   [provider]  etodolac (LODINE) 300 MG capsule 300 mg 2 (two) times daily.  03/25/14   [provider]  fluticasone (FLONASE) 50 MCG/ACT nasal spray as needed.  04/20/14   [provider]  furosemide (LASIX) 20 MG tablet  07/20/14   [provider]  gabapentin (NEURONTIN) 100 MG capsule  08/09/14   [provider]  insulin NPH-insulin regular (HUMULIN 70/30) (70-30) 100 UNIT/ML injection Inject 90 units under skin every AM and 70 units under skin every PM with meals. 07/08/12   Barton Fanny, MD  levothyroxine (SYNTHROID) 25 MCG tablet Take 37.5 mcg by mouth daily.     [provider]  losartan (COZAAR) 100 MG tablet  03/29/13   [provider]  ondansetron (ZOFRAN ODT) 4 MG disintegrating tablet Take 1 tablet (4 mg total) by mouth every 8 (eight) hours as needed for nausea or vomiting. 09/08/20   Quintella Reichert, MD  sucralfate (CARAFATE) 1 G tablet Take 1 tablet (1 g total) by mouth 2 (two) times daily. 03/16/12 03/16/13  Hayden Rasmussen, MD    Physical Exam: Vitals:   09/12/20 0534 09/12/20 0750 09/12/20 0815 09/12/20 0930  BP: (!) 180/78 (!) 155/63  (!) 167/62  Pulse: 69 67 74 67  Resp: 14 18 12 18   Temp:      TempSrc:      SpO2: 100% 97% 100% 98%    General: 64 y.o. female resting in bed in NAD Eyes: PERRL, normal sclera ENMT: Nares patent w/o discharge, orophaynx clear, dentition normal, ears w/o discharge/lesions/ulcers Neck: Supple, trachea midline Cardiovascular: RRR, +S1, S2, no m/g/r, equal pulses throughout Respiratory: CTABL, no w/r/r, normal WOB GI: BS+, ND, obese, NT, no masses noted, no organomegaly noted MSK: No e/c/c Skin: No rashes, bruises, ulcerations noted Neuro: A&O x 3, RLE weakness (4-5/5) with pain in right elicited in right gluteus when leg lifted against resistance; she has somewhat decreased sensation in plantar surfaces b/l; no tenderness to right lumbar with  direct palpation, minor tenderness to palpation of sacrum Psyc: drowsy and flat affect, calm/cooperative  Labs on Admission: I have personally reviewed following labs and imaging studies  CBC: Recent Labs  Lab 09/08/20 2112 09/11/20 2239  WBC 12.0* 10.7*  NEUTROABS 10.3*  --   HGB 13.9 12.7  HCT 40.9 38.4  MCV 85.4 87.3  PLT 165 161   Basic Metabolic Panel: Recent Labs  Lab 09/08/20 2112 09/11/20 2239  NA 130* 134*  K 4.1 3.1*  CL 91* 97*  CO2 25 28  GLUCOSE 208* 76  BUN 27* 17  CREATININE 1.08* 0.89  CALCIUM 8.2* 8.1*   GFR:  Estimated Creatinine Clearance: 89.4 mL/min (by C-G formula based on SCr of 0.89 mg/dL). Liver Function Tests: Recent Labs  Lab 09/08/20 2112 09/11/20 2359  AST 34 37  ALT 20 22  ALKPHOS 110 128*  BILITOT 1.5* 0.6  PROT 7.2 6.9  ALBUMIN 3.3* 2.8*   Recent Labs  Lab 09/08/20 2112  LIPASE 24   No results for input(s): AMMONIA in the last 168 hours. Coagulation Profile: No results for input(s): INR, PROTIME in the last 168 hours. Cardiac Enzymes: No results for input(s): CKTOTAL, CKMB, CKMBINDEX, TROPONINI in the last 168 hours. BNP (last 3 results) No results for input(s): PROBNP in the last 8760 hours. HbA1C: No results for input(s): HGBA1C in the last 72 hours. CBG: Recent Labs  Lab 09/08/20 2116 09/11/20 2237  GLUCAP 208* 73   Lipid Profile: No results for input(s): CHOL, HDL, LDLCALC, TRIG, CHOLHDL, LDLDIRECT in the last 72 hours. Thyroid Function Tests: No results for input(s): TSH, T4TOTAL, FREET4, T3FREE, THYROIDAB in the last 72 hours. Anemia Panel: No results for input(s): VITAMINB12, FOLATE, FERRITIN, TIBC, IRON, RETICCTPCT in the last 72 hours. Urine analysis:    Component Value Date/Time   COLORURINE YELLOW 09/12/2020 0528   APPEARANCEUR CLEAR 09/12/2020 0528   LABSPEC 1.022 09/12/2020 0528   PHURINE 5.0 09/12/2020 0528   GLUCOSEU >=500 (A) 09/12/2020 0528   HGBUR NEGATIVE 09/12/2020 0528   HGBUR  trace-lysed 05/16/2008 0849   BILIRUBINUR NEGATIVE 09/12/2020 0528   KETONESUR NEGATIVE 09/12/2020 0528   PROTEINUR NEGATIVE 09/12/2020 0528   UROBILINOGEN 0.2 05/16/2008 0849   NITRITE NEGATIVE 09/12/2020 0528   LEUKOCYTESUR NEGATIVE 09/12/2020 0528    Radiological Exams on Admission: MR LUMBAR SPINE WO CONTRAST  Result Date: 09/12/2020 CLINICAL DATA:  Low back pain. Right sacroiliac pain radiating to the right leg for 4 days. Inability to void. EXAM: MRI LUMBAR SPINE WITHOUT CONTRAST TECHNIQUE: Multiplanar, multisequence MR imaging of the lumbar spine was performed. No intravenous contrast was administered. COMPARISON:  None. FINDINGS: Segmentation: Normal lumbar segmentation is assumed with the lowest fully formed disc space designated L5-S1. Alignment: Mild lumbar levoscoliosis. Trace retrolisthesis of L2 on L3, L3 on L4, and L4 on L5. Vertebrae: There is mild chronic anterior wedging of the L2 vertebral body. There is no evidence of discitis. There is right facet joint fluid at L4-5 without significant marrow edema or bone destruction. A fluid collection posterior and inferior to the right L4-5 facet joint measures 1.1 x 1.1 x 1.8 cm. There is abnormal T2 hyperintense material in the ventral and dorsal epidural space at L5-S1 measuring up to 5 mm in thickness and resulting in severe spinal stenosis. Conus medullaris and cauda equina: Conus extends to the T12-L1 level. Conus and cauda equina appear normal. Paraspinal and other soft tissues: Edema in the right-sided posterior paraspinal musculature in the lower lumbar spine. Disc levels: T12-L1: Disc desiccation. Small left paracentral disc extrusion with slight superior migration. No significant stenosis. L1-2: Tiny central disc protrusion, mild left eccentric disc bulging, and mild facet arthrosis without stenosis. L2-3: Disc desiccation and severe disc space narrowing. Right eccentric disc bulging, endplate spurring, disc space height loss, and  moderate facet and ligamentum flavum hypertrophy result in moderate spinal stenosis, moderate to severe bilateral lateral recess stenosis, and severe right neural foraminal stenosis. Potential right L2 and bilateral L3 nerve root impingement. L3-4: Disc desiccation. Disc bulging and severe facet and ligamentum flavum hypertrophy result in mild-to-moderate spinal stenosis, moderate bilateral lateral recess stenosis, and mild bilateral neural foraminal  stenosis. L4-5: Disc desiccation. Disc bulging and severe facet and ligamentum flavum hypertrophy result in moderate spinal stenosis, moderate left greater than right lateral recess stenosis, and moderate left greater than right neural foraminal stenosis. L5-S1: Normal disc. Bilateral facet arthrosis. Severe spinal stenosis due to the above described epidural process. Patent neural foramina. IMPRESSION: 1. Abnormal epidural process at L5-S1 most consistent with infectious phlegmon or abscess resulting in severe spinal stenosis. 2. Right-sided facet joint fluid at L4-5 without significant marrow edema or bone destruction, indeterminate for septic facet arthritis although there is a small fluid collection posterior and inferior to the facet joint which could reflect an abscess. 3. Right-sided posterior paraspinal muscle edema which may reflect myositis. 4. Advanced disc degeneration at L2-3 with moderate spinal stenosis and moderate to severe lateral recess stenosis. 5. Moderate lateral recess stenosis at L3-4 and L4-5. Electronically Signed   By: Logan Bores M.D.   On: 09/12/2020 09:26    EKG: Independently reviewed. Sinus rhythm, no st changes  Assessment/Plan Low back pain Possible L5-S1 epidural abscess      - admit to inpatient, med-surg      - Spoke with neurosurgery, will transfer to [facility]      - hold on abx for right now, d/t probable need for Bx      - neurosurgery will eval when she arrives at [facility]  Hypokalemia Hyponatremia     - replace K+, check  Mg2+     - Na+ is mildly low, follow  HTN     - PRN meds until home meds reconciled; pt is drowsy right now and unable to confirm  Hypothyroidism     - continue home levothyroxine  DM2     - SSI, DM2 diet, check A1c, glucose checks  DVT prophylaxis: lovenox  Code Status: FULL  Family Communication: None at bedside.   Consults called: EDP spoke with neurosurgery who requested transfer to [facility]  Admission status: Inpatient   Time spent coordinating admission: 75 minutes  [facility]  If 7PM-7AM, please contact night-coverage [email or web address]  09/12/2020, 11:26 AM

## 2020-09-12 NOTE — ED Notes (Signed)
Pt provided with orange juice at this time.

## 2020-09-12 NOTE — Progress Notes (Signed)
Pt arrived to unit VSS, via carelink. Pt made comfortable, will continue to monitor

## 2020-09-13 DIAGNOSIS — E871 Hypo-osmolality and hyponatremia: Secondary | ICD-10-CM | POA: Diagnosis not present

## 2020-09-13 DIAGNOSIS — E876 Hypokalemia: Secondary | ICD-10-CM | POA: Diagnosis not present

## 2020-09-13 DIAGNOSIS — I10 Essential (primary) hypertension: Secondary | ICD-10-CM | POA: Diagnosis not present

## 2020-09-13 DIAGNOSIS — G061 Intraspinal abscess and granuloma: Secondary | ICD-10-CM | POA: Diagnosis not present

## 2020-09-13 DIAGNOSIS — E1165 Type 2 diabetes mellitus with hyperglycemia: Secondary | ICD-10-CM

## 2020-09-13 DIAGNOSIS — M5442 Lumbago with sciatica, left side: Secondary | ICD-10-CM

## 2020-09-13 DIAGNOSIS — M5441 Lumbago with sciatica, right side: Secondary | ICD-10-CM

## 2020-09-13 LAB — CBC WITH DIFFERENTIAL/PLATELET
Abs Immature Granulocytes: 0.16 10*3/uL — ABNORMAL HIGH (ref 0.00–0.07)
Basophils Absolute: 0 10*3/uL (ref 0.0–0.1)
Basophils Relative: 0 %
Eosinophils Absolute: 0.2 10*3/uL (ref 0.0–0.5)
Eosinophils Relative: 2 %
HCT: 36.5 % (ref 36.0–46.0)
Hemoglobin: 12.3 g/dL (ref 12.0–15.0)
Immature Granulocytes: 2 %
Lymphocytes Relative: 9 %
Lymphs Abs: 0.8 10*3/uL (ref 0.7–4.0)
MCH: 28.9 pg (ref 26.0–34.0)
MCHC: 33.7 g/dL (ref 30.0–36.0)
MCV: 85.9 fL (ref 80.0–100.0)
Monocytes Absolute: 0.8 10*3/uL (ref 0.1–1.0)
Monocytes Relative: 9 %
Neutro Abs: 7.7 10*3/uL (ref 1.7–7.7)
Neutrophils Relative %: 78 %
Platelets: 378 10*3/uL (ref 150–400)
RBC: 4.25 MIL/uL (ref 3.87–5.11)
RDW: 13.6 % (ref 11.5–15.5)
WBC: 9.7 10*3/uL (ref 4.0–10.5)
nRBC: 0 % (ref 0.0–0.2)

## 2020-09-13 LAB — GLUCOSE, CAPILLARY
Glucose-Capillary: 189 mg/dL — ABNORMAL HIGH (ref 70–99)
Glucose-Capillary: 186 mg/dL — ABNORMAL HIGH (ref 70–99)
Glucose-Capillary: 208 mg/dL — ABNORMAL HIGH (ref 70–99)
Glucose-Capillary: 171 mg/dL — ABNORMAL HIGH (ref 70–99)

## 2020-09-13 LAB — RENAL FUNCTION PANEL
Albumin: 2.4 g/dL — ABNORMAL LOW (ref 3.5–5.0)
Anion gap: 12 (ref 5–15)
BUN: 8 mg/dL (ref 8–23)
CO2: 24 mmol/L (ref 22–32)
Calcium: 8.1 mg/dL — ABNORMAL LOW (ref 8.9–10.3)
Chloride: 95 mmol/L — ABNORMAL LOW (ref 98–111)
Creatinine, Ser: 0.72 mg/dL (ref 0.44–1.00)
GFR, Estimated: >60 mL/min
Glucose, Bld: 219 mg/dL — ABNORMAL HIGH (ref 70–99)
Phosphorus: 2.1 mg/dL — ABNORMAL LOW (ref 2.5–4.6)
Potassium: 4.1 mmol/L (ref 3.5–5.1)
Sodium: 131 mmol/L — ABNORMAL LOW (ref 135–145)

## 2020-09-13 LAB — HEMOGLOBIN A1C
Hgb A1c MFr Bld: 8.6 % — ABNORMAL HIGH (ref 4.8–5.6)
Mean Plasma Glucose: 200.12 mg/dL

## 2020-09-13 LAB — MAGNESIUM: Magnesium: 2.1 mg/dL (ref 1.7–2.4)

## 2020-09-13 MED ORDER — INSULIN ASPART 100 UNIT/ML ~~LOC~~ SOLN
6.0000 [IU] | Freq: Three times a day (TID) | SUBCUTANEOUS | Status: DC
  Administered 2020-09-14: 6 [IU] via SUBCUTANEOUS

## 2020-09-13 MED ORDER — BOOST / RESOURCE BREEZE PO LIQD CUSTOM
1.0000 | Freq: Three times a day (TID) | ORAL | Status: DC
  Administered 2020-09-13 – 2020-09-14 (×3): 1 via ORAL

## 2020-09-13 MED ORDER — INSULIN ASPART 100 UNIT/ML ~~LOC~~ SOLN
0.0000 [IU] | Freq: Three times a day (TID) | SUBCUTANEOUS | Status: DC
  Administered 2020-09-13: 5 [IU] via SUBCUTANEOUS
  Administered 2020-09-14: 11 [IU] via SUBCUTANEOUS
  Administered 2020-09-14: 5 [IU] via SUBCUTANEOUS
  Administered 2020-09-14: 3 [IU] via SUBCUTANEOUS
  Administered 2020-09-15 (×2): 8 [IU] via SUBCUTANEOUS

## 2020-09-13 MED ORDER — PROSOURCE PLUS PO LIQD
30.0000 mL | Freq: Two times a day (BID) | ORAL | Status: DC
  Administered 2020-09-14: 30 mL via ORAL
  Filled 2020-09-13 (×2): qty 30

## 2020-09-13 MED ORDER — DULOXETINE HCL 60 MG PO CPEP
90.0000 mg | ORAL_CAPSULE | Freq: Every day | ORAL | Status: DC
  Administered 2020-09-15 – 2020-09-24 (×9): 90 mg via ORAL
  Filled 2020-09-13 (×10): qty 1

## 2020-09-13 MED ORDER — AMLODIPINE BESYLATE 5 MG PO TABS: 5.0000 mg | ORAL_TABLET | Freq: Every day | ORAL | Status: DC

## 2020-09-13 MED ORDER — ADULT MULTIVITAMIN W/MINERALS CH
1.0000 | ORAL_TABLET | Freq: Every day | ORAL | Status: DC
  Administered 2020-09-15 – 2020-09-24 (×9): 1 via ORAL
  Filled 2020-09-13 (×10): qty 1

## 2020-09-13 MED ORDER — AMLODIPINE BESYLATE 10 MG PO TABS
10.0000 mg | ORAL_TABLET | Freq: Every day | ORAL | Status: DC
  Administered 2020-09-13 – 2020-09-24 (×11): 10 mg via ORAL
  Filled 2020-09-13 (×12): qty 1

## 2020-09-13 MED ORDER — ATORVASTATIN CALCIUM 40 MG PO TABS
40.0000 mg | ORAL_TABLET | Freq: Every day | ORAL | Status: DC
  Administered 2020-09-15 – 2020-09-24 (×9): 40 mg via ORAL
  Filled 2020-09-13 (×11): qty 1

## 2020-09-13 MED ORDER — FENTANYL CITRATE (PF) 100 MCG/2ML IJ SOLN
25.0000 ug | INTRAMUSCULAR | Status: DC | PRN
  Administered 2020-09-13 (×2): 50 ug via INTRAVENOUS
  Administered 2020-09-13: 25 ug via INTRAVENOUS
  Administered 2020-09-13 – 2020-09-15 (×8): 50 ug via INTRAVENOUS
  Filled 2020-09-13 (×12): qty 2

## 2020-09-13 MED ORDER — INSULIN ASPART 100 UNIT/ML ~~LOC~~ SOLN
0.0000 [IU] | Freq: Every day | SUBCUTANEOUS | Status: DC
  Administered 2020-09-14: 5 [IU] via SUBCUTANEOUS

## 2020-09-13 NOTE — Plan of Care (Signed)

## 2020-09-13 NOTE — Progress Notes (Signed)
Arrived to pt room. Nurse had already placed PIV. Fran Lowes, RN VAST

## 2020-09-13 NOTE — TOC Initial Note (Addendum)
Transition of Care [facility]) - Initial/Assessment Note    Patient Details  Name: Brittany Silva MRN: [id number] Date of Birth: 11-Dec-1955  Transition of [facility] CM/SW Contact:    Sharin Mons, RN Phone Number: [phone] 09/13/2020, 1:01 PM  Clinical Narrative:    Admitted with ? epidural abscess, hx of diabetes chronic low back pain, hypertension, hypothyroidism.        From home with family. NCM spoke with pt in regard to discharge planning. Pt states prior to admit independent with ADL's, no DME usage.  Per neuro recommendations: ? IR biopsy for tissue diagnosis, consult ID for antibiotic therapy... will likely need LT  Antibiotic therapy, PT/OT evals. Referral made with [facility] / Pam to follow for potential need for IV ABX therapy.  Pt states if [facility] services are needed @ d/c pt without preference.....  TOC team will continue to monitor and follow for needs.....  Expected Discharge Plan: Home/Self Care (Son, [place]) Barriers to Discharge: Continued Medical Work up   Patient Goals and CMS Choice     Choice offered to / list presented to : Patient  Expected Discharge Plan and Services Expected Discharge Plan: Home/Self Care (Son, [place])   Discharge Planning Services: CM Consult   Living arrangements for the past 2 months: Single Family Home                                      Prior Living Arrangements/Services Living arrangements for the past 2 months: Single Family Home Lives with:: Adult Children Patient language and need for interpreter reviewed:: Yes        Need for Family Participation in Patient Care: Yes (Comment) Care giver support system in place?: Yes (comment)   Criminal Activity/Legal Involvement Pertinent to Current Situation/Hospitalization: No - Comment as needed  Activities of Daily Living Home Assistive Devices/Equipment: Eyeglasses, Environmental consultant (specify type), Cane (specify quad or straight), CBG Meter ADL Screening (condition  at time of admission) Patient's cognitive ability adequate to safely complete daily activities?: Yes Is the patient deaf or have difficulty hearing?: Yes Does the patient have difficulty seeing, even when wearing glasses/contacts?: Yes Does the patient have difficulty concentrating, remembering, or making decisions?: Yes (only when sugar is messed up) Patient able to express need for assistance with ADLs?: Yes Does the patient have difficulty dressing or bathing?: Yes Independently performs ADLs?: No Communication: Independent Dressing (OT): Needs assistance Is this a change from baseline?: Change from baseline, expected to last >3 days Grooming: Independent Feeding: Independent Bathing: Needs assistance Is this a change from baseline?: Change from baseline, expected to last >3 days Toileting: Needs assistance Is this a change from baseline?: Change from baseline, expected to last >3days In/Out Bed: Needs assistance Is this a change from baseline?: Change from baseline, expected to last >3 days Walks in Home: Dependent Is this a change from baseline?: Change from baseline, expected to last >3 days Does the patient have difficulty walking or climbing stairs?: Yes Weakness of Legs: Both Weakness of Arms/Hands: Both  Permission Sought/Granted                  Emotional Assessment Appearance:: Appears stated age Attitude/Demeanor/Rapport: Gracious Affect (typically observed): Accepting Orientation: : Oriented to Self, Oriented to Place, Oriented to  Time, Oriented to Situation Alcohol / Substance Use: Not Applicable Psych Involvement: No (comment)  Admission diagnosis:  Abscess in epidural space of lumbar spine [  G06.1] Patient Active Problem List   Diagnosis Date Noted  . Abscess in epidural space of lumbar spine 09/12/2020  . Obstructive apnea   . Sleep disturbance 08/17/2012  . History of mammogram 07/29/2012  . Chest pain 04/06/2012  . EDEMA 05/16/2008  . Morbid  obesity ([place]) 04/13/2008  . CARPAL TUNNEL SYNDROME, BILATERAL 01/15/2008  . ALLERGIC RHINITIS, SEASONAL 01/06/2008  . ANXIETY DISORDER, GENERALIZED 05/01/2007  . PERIPHERAL NEUROPATHY 05/01/2007  . ENDOMETRIAL POLYP 05/01/2007  . HYPERPLASIA, ENDOMETRIAL NOS 05/01/2007  . DIABETES MELLITUS II, UNCOMPLICATED 33/00/7622  . HYPERLIPIDEMIA 01/29/2007  . ANEMIA, OTHER, UNSPECIFIED 01/29/2007  . HYPERTENSION, BENIGN SYSTEMIC 01/29/2007   PCP:  Carol Ada, MD Pharmacy:   [facility] [place] [address] [place] [address] Phone: [phone] Fax: [phone]  [facility] [place], [address] [place] [address] Phone: [phone] Fax: [phone]     Social Determinants of Health (SDOH) Interventions Intimate Partner Violence Interventions: Intervention Not Indicated  Readmission Risk Interventions No flowsheet data found.

## 2020-09-13 NOTE — Plan of Care (Signed)

## 2020-09-13 NOTE — Consult Note (Signed)
Chief Complaint: Patient was seen in consultation today for right L4-5 facet fluid collection/aspiration.  Referring Physician(s): [facility])  Supervising Physician: Arne Cleveland  Patient Status: [facility] - In-pt  History of Present Illness: GRANT HENKES is a 64 y.o. female with a past medical history of hypertension, hyperlipidemia, PVCs, GERD, diabetes mellitus with associated retinopathy, hypothyroidism, vitamin D deficiency, OA, OSA, obesity, and anxiety. She presented to [facility] ED 09/11/2020 with complaint of back pain. In ED, UA revealed cystitis and MR lumbar spine revealed findings concerning for epidural abscess. Patient was transferred and admitted to [facility] for further management. Neurosurgery was consulted who does not recommend surgical intervention, and recommends IR consult for possible biopsy/aspiration for tissue diagnosis.  MR lumbar spine (W/O) 09/12/2020: 1. Abnormal epidural process at L5-S1 most consistent with infectious phlegmon or abscess resulting in severe spinal stenosis. 2. Right-sided facet joint fluid at L4-5 without significant marrow edema or bone destruction, indeterminate for septic facet arthritis although there is a small fluid collection posterior and inferior to the facet joint which could reflect an abscess. 3. Right-sided posterior paraspinal muscle edema which may reflect myositis. 4. Advanced disc degeneration at L2-3 with moderate spinal stenosis and moderate to severe lateral recess stenosis. 5. Moderate lateral recess stenosis at L3-4 and L4-5.  CT lumbar spine 09/12/2020: 1. Multilevel degenerative change throughout the lumbar spine as above 2. MRI findings suspicious for infection. These are best seen by MRI not well visualized by CT. Recommend follow-up MRI lumbar spine with contrast to evaluate for epidural abscess and abnormal enhancement.  MR lumbar spine (W/) 09/12/2020: 1. Findings are very suspicious for lumbar spinal infection  on the right. There are ventral and posterior fluid collections around the L5-S1 disc space compatible with epidural abscess. There is soft tissue enhancement on the right posterior to the L4-5 and L5-S1 facet joints which may be infected. There is a 1 cm fluid collection posterior to the facet joints on the right which could be an abscess.  IR consulted by Dr. Cyndia Skeeters for possible image-guided right L4-5 facet aspiration. Patient awake and alert laying in bed. Complains of abdominal pain, rated 4/10, along with back pain, rated 7/10, stable since admission. Denies fever, chills, chest pain, dyspnea, or headache.   Past Medical History:  Diagnosis Date  . Ankle fracture    left  . Anxiety    chronic  . Cervical back pain with evidence of disc disease   . Diabetes mellitus   . Dyslipidemia   . Edema    lower extremity  . GERD (gastroesophageal reflux disease)   . Hyperlipidemia   . Hypertension   . Hypothyroid   . Obstructive apnea    Split study 05-22-12 - patient was too claustrophobic and did not persue CPAP.   Marland Kitchen Osteoarthritis   . PVC's (premature ventricular contractions)   . Retinopathy    diabetic, mild, nonproliferative bilateral  . TSH (thyroid-stimulating hormone deficiency)    evaluation off RX in 4/14  . Vitamin D deficiency     Past Surgical History:  Procedure Laterality Date  . ABDOMINAL HYSTERECTOMY    . CATARACT EXTRACTION Bilateral 2012  . EYE SURGERY Right 06/26/11   cataract removal Rt eye  . FOOT SURGERY     Ankle fracture  . index finger surgery  2007   spider bite  . ORIF ANKLE FRACTURE Left [id number]  . Right knee surgery  1966   torn cartilage  . TONSILLECTOMY  1962  adenoidectomy  . TUBAL LIGATION Bilateral 1986    Allergies: Hydrochlorothiazide, Lisinopril, Adhesive [tape], Codeine phosphate, Darvon, Darvon [propoxyphene hcl], Hydrocodone-acetaminophen, Propoxyphene hcl, Latex, Neosporin [neomycin-bacitracin zn-polymyx], and  Sulfamethoxazole  Medications: Prior to Admission medications   Medication Sig Start Date End Date Taking? Authorizing Provider  acetaminophen (TYLENOL) 500 MG tablet Take 500-2,000 mg by mouth as needed for moderate pain.    Yes [provider]  amLODipine (NORVASC) 5 MG tablet Take 1 tablet (5 mg total) by mouth daily. 06/19/12  Yes Barton Fanny, MD  atorvastatin (LIPITOR) 40 MG tablet Take 40 mg by mouth daily.   Yes [provider]  cephALEXin (KEFLEX) 500 MG capsule Take 1 capsule (500 mg total) by mouth 3 (three) times daily. 09/08/20  Yes Quintella Reichert, MD  DULoxetine (CYMBALTA) 30 MG capsule Take 90 mg by mouth daily. 08/23/20  Yes [provider]  furosemide (LASIX) 20 MG tablet Take 40 mg by mouth daily.  07/20/14  Yes [provider]  HUMALOG KWIKPEN 100 UNIT/ML KwikPen Inject 15-25 Units into the skin in the morning, at noon, and at bedtime. 08/28/20  Yes [provider]  LEVEMIR 100 UNIT/ML injection Inject 75 Units into the skin in the morning and at bedtime. 09/04/20  Yes [provider]  losartan (COZAAR) 100 MG tablet  03/29/13  Yes [provider]  meloxicam (MOBIC) 15 MG tablet Take 15 mg by mouth daily. 09/04/20  Yes [provider]  ondansetron (ZOFRAN ODT) 4 MG disintegrating tablet Take 1 tablet (4 mg total) by mouth every 8 (eight) hours as needed for nausea or vomiting. 09/08/20  Yes Quintella Reichert, MD  potassium chloride (KLOR-CON) 8 MEQ tablet Take 8 mEq by mouth 2 (two) times daily as needed (low potassium).  09/09/20  Yes [provider]  SYNTHROID 75 MCG tablet Take 75 mcg by mouth every morning. 07/05/20  Yes [provider]     Family History  Problem Relation Age of Onset  . Coronary artery disease Father        MI at age 65  . Hypertension Mother   . Thyroid disease Mother   . Ulcers Sister   . Allergies Sister   . Psoriasis Sister   . Diabetes Maternal Grandfather    . Heart attack Paternal Grandfather   . Colon cancer Neg Hx     Social History   Socioeconomic History  . Marital status: Married    Spouse name: Not on file  . Number of children: 2  . Years of education: 40  . Highest education level: Not on file  Occupational History    Employer: UNEMPLOYED    Comment: [employer]  Tobacco Use  . Smoking status: Never Smoker  . Smokeless tobacco: Never Used  Vaping Use  . Vaping Use: Never used  Substance and Sexual Activity  . Alcohol use: No  . Drug use: No  . Sexual activity: Not on file  Other Topics Concern  . Not on file  Social History Narrative  . Not on file   Social Determinants of Health   Financial Resource Strain:   . Difficulty of Paying Living Expenses: Not on file  Food Insecurity:   . Worried About Charity fundraiser in the Last Year: Not on file  . Ran Out of Food in the Last Year: Not on file  Transportation Needs:   . Lack of Transportation (Medical): Not on file  . Lack of Transportation (Non-Medical): Not on file  Physical Activity:   . Days of Exercise per Week: Not on file  . Minutes of Exercise per Session: Not on file  Stress:   . Feeling of Stress : Not on file  Social Connections:   . Frequency of Communication with Friends and Family: Not on file  . Frequency of Social Gatherings with Friends and Family: Not on file  . Attends Religious Services: Not on file  . Active Member of Clubs or Organizations: Not on file  . Attends Archivist Meetings: Not on file  . Marital Status: Not on file     Review of Systems: A 12 point ROS discussed and pertinent positives are indicated in the HPI above.  All other systems are negative.  Review of Systems  Constitutional: Negative for chills and fever.  Respiratory: Negative for shortness of breath and wheezing.   Cardiovascular: Negative for chest pain and palpitations.  Gastrointestinal: Positive for abdominal pain.  Musculoskeletal:  Positive for back pain.  Neurological: Negative for headaches.  Psychiatric/Behavioral: Negative for behavioral problems and confusion.    Vital Signs: BP (!) 151/61   Pulse 76   Temp 98.8 F (37.1 C) (Oral)   Resp 16   Ht 5\' 7"  (1.702 m)   Wt 285 lb (129.3 kg)   SpO2 96%   BMI 44.64 kg/m   Physical Exam Vitals and nursing note reviewed.  Constitutional:      General: She is not in acute distress.    Appearance: Normal appearance. She is obese.  Cardiovascular:     Rate and Rhythm: Normal rate and regular rhythm.     Heart sounds: Normal heart sounds. No murmur heard.   Pulmonary:     Effort: Pulmonary effort is normal. No respiratory distress.     Breath sounds: Normal breath sounds. No wheezing.  Musculoskeletal:     Comments: Moderate midline low back tenderness.  Skin:    General: Skin is warm and dry.  Neurological:     Mental Status: She is alert and oriented to person, place, and time.      MD Evaluation Airway: WNL Heart: WNL Abdomen: WNL Chest/ Lungs: WNL ASA  Classification: 3 Mallampati/Airway Score: Two   Imaging: CT Lumbar Spine Wo Contrast  Result Date: 09/12/2020 CLINICAL DATA:  Low back pain. Suspect infection. Pain radiating down leg. EXAM: CT LUMBAR SPINE WITHOUT CONTRAST TECHNIQUE: Multidetector CT imaging of the lumbar spine was performed without intravenous contrast administration. Multiplanar CT image reconstructions were also generated. COMPARISON:  MRI lumbar spine without contrast 09/12/2020 FINDINGS: Segmentation: Normal Alignment: Slight retrolisthesis L4-5. Mild levoscoliosis. Vertebrae: Negative for fracture or mass. No bony erosion of the endplates. Paraspinal and other soft tissues: Negative for paraspinous mass, adenopathy, or fluid collection. Psoas muscle symmetric in size bilaterally. Atherosclerotic disease without aortic aneurysm. Disc levels: T12-L1: Disc degeneration with Schmorl's node and left paracentral disc protrusion  which is partially calcified. Mild spinal stenosis on the left. L1-2: Diffuse disc bulging and bilateral facet degeneration. No significant spinal or foraminal stenosis L2-3: Advanced disc degeneration asymmetric to the right. Disc space narrowing and spurring asymmetric to the right. Moderate to severe subarticular and foraminal stenosis on the right due to spurring. Mild to moderate spinal stenosis. Left foramen patent. L3-4: Disc degeneration with disc bulging. Bilateral facet degeneration. Mild spinal stenosis. Moderate right subarticular stenosis. L4-5: Diffuse bulging of the disc. Bilateral facet hypertrophy. Mild spinal stenosis. Moderate subarticular stenosis bilaterally. No bony erosive changes in the facet joints. Soft tissue fluid  collection posterior and inferior to the right L4-5 facet joint seen on MRI not visualized by CT. L5-S1: Normal disc space. Bilateral facet degeneration right greater than left with bony hypertrophy of the facet joints. No bony erosive changes. Moderate subarticular stenosis bilaterally. Epidural fluid collection at this level seen on MRI not visualized by CT. IMPRESSION: Multilevel degenerative change throughout the lumbar spine as above MRI findings suspicious for infection. These are best seen by MRI not well visualized by CT. Recommend follow-up MRI lumbar spine with contrast to evaluate for epidural abscess and abnormal enhancement. Electronically Signed   By: Franchot Gallo M.D.   On: 09/12/2020 11:44   MR LUMBAR SPINE WO CONTRAST  Result Date: 09/12/2020 CLINICAL DATA:  Low back pain. Right sacroiliac pain radiating to the right leg for 4 days. Inability to void. EXAM: MRI LUMBAR SPINE WITHOUT CONTRAST TECHNIQUE: Multiplanar, multisequence MR imaging of the lumbar spine was performed. No intravenous contrast was administered. COMPARISON:  None. FINDINGS: Segmentation: Normal lumbar segmentation is assumed with the lowest fully formed disc space designated L5-S1.  Alignment: Mild lumbar levoscoliosis. Trace retrolisthesis of L2 on L3, L3 on L4, and L4 on L5. Vertebrae: There is mild chronic anterior wedging of the L2 vertebral body. There is no evidence of discitis. There is right facet joint fluid at L4-5 without significant marrow edema or bone destruction. A fluid collection posterior and inferior to the right L4-5 facet joint measures 1.1 x 1.1 x 1.8 cm. There is abnormal T2 hyperintense material in the ventral and dorsal epidural space at L5-S1 measuring up to 5 mm in thickness and resulting in severe spinal stenosis. Conus medullaris and cauda equina: Conus extends to the T12-L1 level. Conus and cauda equina appear normal. Paraspinal and other soft tissues: Edema in the right-sided posterior paraspinal musculature in the lower lumbar spine. Disc levels: T12-L1: Disc desiccation. Small left paracentral disc extrusion with slight superior migration. No significant stenosis. L1-2: Tiny central disc protrusion, mild left eccentric disc bulging, and mild facet arthrosis without stenosis. L2-3: Disc desiccation and severe disc space narrowing. Right eccentric disc bulging, endplate spurring, disc space height loss, and moderate facet and ligamentum flavum hypertrophy result in moderate spinal stenosis, moderate to severe bilateral lateral recess stenosis, and severe right neural foraminal stenosis. Potential right L2 and bilateral L3 nerve root impingement. L3-4: Disc desiccation. Disc bulging and severe facet and ligamentum flavum hypertrophy result in mild-to-moderate spinal stenosis, moderate bilateral lateral recess stenosis, and mild bilateral neural foraminal stenosis. L4-5: Disc desiccation. Disc bulging and severe facet and ligamentum flavum hypertrophy result in moderate spinal stenosis, moderate left greater than right lateral recess stenosis, and moderate left greater than right neural foraminal stenosis. L5-S1: Normal disc. Bilateral facet arthrosis. Severe spinal  stenosis due to the above described epidural process. Patent neural foramina. IMPRESSION: 1. Abnormal epidural process at L5-S1 most consistent with infectious phlegmon or abscess resulting in severe spinal stenosis. 2. Right-sided facet joint fluid at L4-5 without significant marrow edema or bone destruction, indeterminate for septic facet arthritis although there is a small fluid collection posterior and inferior to the facet joint which could reflect an abscess. 3. Right-sided posterior paraspinal muscle edema which may reflect myositis. 4. Advanced disc degeneration at L2-3 with moderate spinal stenosis and moderate to severe lateral recess stenosis. 5. Moderate lateral recess stenosis at L3-4 and L4-5. Electronically Signed   By: Logan Bores M.D.   On: 09/12/2020 09:26   MR LUMBAR SPINE W CONTRAST  Result Date: 09/12/2020  CLINICAL DATA:  Numbness and tingling, paresthesia. Concern for spinal infection on unenhanced MRI. Diabetes. Leukocytosis. EXAM: MRI LUMBAR SPINE WITH CONTRAST CONTRAST:  26mL GADAVIST GADOBUTROL 1 MMOL/ML IV SOLN COMPARISON:  MRI lumbar spine without contrast 09/12/2020. CT lumbar spine 09/12/2020 FINDINGS: Postcontrast imaging confirms posterior epidural fluid collections bilaterally at the L5-S1 and S1 level. These show peripheral enhancement and are suspicious for epidural abscess. Additional ventral epidural fluid collection anterior to the thecal sac at the L5 level also is suspicious for abscess. This exerts mass-effect on the thecal sac with moderate spinal stenosis. There is asymmetric abnormal enhancement in the posterior paraspinous muscles on the right at L4-5 and L5-S1 suggestive of infection. There is a rim enhancing fluid collection posterior to the L5-S1 facet joint which could represent an abscess. This could be arising from the L4-5 or L5-S1 facet joint. There is degenerative change in the facet joints bilaterally at L4-5 and L5-S1. No evidence of discitis. Multilevel  degenerative change throughout the lumbar spine as described on the prior study. There is moderate spinal stenosis L4-5 and moderate to severe spinal stenosis at L5-S1. IMPRESSION: Findings are very suspicious for lumbar spinal infection on the right. There are ventral and posterior fluid collections around the L5-S1 disc space compatible with epidural abscess. There is soft tissue enhancement on the right posterior to the L4-5 and L5-S1 facet joints which may be infected. There is a 1 cm fluid collection posterior to the facet joints on the right which could be an abscess. Electronically Signed   By: Franchot Gallo M.D.   On: 09/12/2020 12:20   DG Toe 4th Left  Result Date: 09/08/2020 CLINICAL DATA:  Wound to fourth toe EXAM: LEFT FOURTH TOE COMPARISON:  05/12/2014 FINDINGS: No fracture or malalignment. No soft tissue emphysema. No gross bony destructive change or periostitis. IMPRESSION: No acute osseous abnormality. Electronically Signed   By: Donavan Foil M.D.   On: 09/08/2020 22:10    Labs:  CBC: Recent Labs    09/08/20 2112 09/11/20 2239 09/12/20 1811 09/13/20 1525  WBC 12.0* 10.7* 11.7* 9.7  HGB 13.9 12.7 13.5 12.3  HCT 40.9 38.4 40.5 36.5  PLT 165 265 320 378    COAGS: No results for input(s): INR, APTT in the last 8760 hours.  BMP: Recent Labs    09/08/20 2112 09/11/20 2239 09/12/20 1811  NA 130* 134*  --   K 4.1 3.1*  --   CL 91* 97*  --   CO2 25 28  --   GLUCOSE 208* 76  --   BUN 27* 17  --   CALCIUM 8.2* 8.1*  --   CREATININE 1.08* 0.89 0.68  GFRNONAA 54* >60 >60    LIVER FUNCTION TESTS: Recent Labs    09/08/20 2112 09/11/20 2359  BILITOT 1.5* 0.6  AST 34 37  ALT 20 22  ALKPHOS 110 128*  PROT 7.2 6.9  ALBUMIN 3.3* 2.8*     Assessment and Plan:  Epidural abscess with associated right L4-5 facet fluid collection. Plan for image-guided right L4-5 facet fluid collection aspiration in IR tentatively for tomorrow 09/14/2020 pending IR  scheduling. Patient will be NPO at midnight. Afebrile and WBCs WNL. She does not take blood thinners. INR pending.  Risks and benefits discussed with the patient including, but not limited to bleeding, infection, damage to adjacent structures or low yield requiring additional tests. All of the patient's questions were answered, patient is agreeable to proceed. Consent signed and in chart.  Thank you for this interesting consult.  I greatly enjoyed meeting OLENE GODFREY and look forward to participating in their care.  A copy of this report was sent to the requesting provider on this date.  Electronically Signed: Earley Abide, PA-C 09/13/2020, 4:03 PM   I spent a total of 40 Minutes in face to face in clinical consultation, greater than 50% of which was counseling/coordinating care for right L4-5 facet fluid collection/aspiration.

## 2020-09-13 NOTE — Progress Notes (Signed)
Initial Nutrition Assessment  DOCUMENTATION CODES:   Morbid obesity  INTERVENTION:   -30 ml Prosource Plus BID, each supplement provides 100 kcals and 15 grams protein -Boost Breeze po TID, each supplement provides 250 kcal and 9 grams of protein -MVI with minerals daily -RD will follow for diet advancement and adjust supplement regimen as appropriate  NUTRITION DIAGNOSIS:   Increased nutrient needs related to acute illness as evidenced by estimated needs.  GOAL:   Patient will meet greater than or equal to 90% of their needs  MONITOR:   PO intake, Supplement acceptance, Diet advancement, Labs, Weight trends, Skin, I & O's  REASON FOR ASSESSMENT:   Malnutrition Screening Tool    ASSESSMENT:   Brittany Silva is a 64 y.o. female with medical history significant of DM2, HTN, hypothyroidism. Presenting with 5 days of back/leg pain, N/V. Pain started in the right buttock on Thursday while resting. It was sharp and radiated down to the right knee. She tried APAP and heating pads but didn't work. She also had N/V during this time. These symptoms continued through Friday. She went to the ED and was told she had a UTI. She was sent home with antibiotics. The was only able to take her abx for a day or two and it did not improve her situation. Her pain/N/V worsened and she had decreased urination. Out of concern for worsening condition, she returned to the ED last night.  Pt admitted with low back pain and possible L5-S1 epidural abscess.   Reviewed I/O's: +240 ml x 24 hours  Per neurosurgery notes, plan for IR biopsy for tissue diagnosis (no plan for surgical intervention at this time).   Pt on phone at time of visit. Unable to obtain further nutrition-related history at this time.   Pt currently on a clear liquid diet. No meal completions available to assess at this time. Pt would benefit from addition of oral nutritional supplements to meet needs.   Reviewed wt hx; pt wt has  been stable over the past several years.   Lab Results  Component Value Date   HGBA1C 8.6 (H) 09/12/2020   PTA DM medications are 90 units insulin NPH-insulin regular in the morning and 70 units insulin NPH-insulin regular in the afternoon.   Labs reviewed: CBGS: 165-189 (inpatient orders for glycemic control are 0-9 units insulin aspart TID with meals and 0-5 units insulin aspart daily at bedtime).   NUTRITION - FOCUSED PHYSICAL EXAM:    Most Recent Value  Orbital Region No depletion  Upper Arm Region No depletion  Thoracic and Lumbar Region No depletion  Buccal Region No depletion  Temple Region No depletion  Clavicle Bone Region No depletion  Clavicle and Acromion Bone Region No depletion  Scapular Bone Region No depletion  Dorsal Hand No depletion  Patellar Region No depletion  Anterior Thigh Region No depletion  Posterior Calf Region No depletion  Edema (RD Assessment) Mild  Hair Reviewed  Eyes Reviewed  Mouth Reviewed  Skin Reviewed  Nails Reviewed       Diet Order:   Diet Order            Diet clear liquid Room service appropriate? Yes; Fluid consistency: Thin  Diet effective now                 EDUCATION NEEDS:   No education needs have been identified at this time  Skin:  Skin Assessment: Reviewed RN Assessment  Last BM:  09/11/20  Height:  Ht Readings from Last 1 Encounters:  09/13/20 5\' 7"  (1.702 m)    Weight:   Wt Readings from Last 1 Encounters:  09/13/20 129.3 kg    Ideal Body Weight:  61.4 kg  BMI:  Body mass index is 44.64 kg/m.  Estimated Nutritional Needs:   Kcal:  1850-2050  Protein:  110-125 grams  Fluid:  > 1.8 L    Loistine Chance, RD, LDN, [place] Registered Dietitian II Certified Diabetes Care and Education Specialist Please refer to [facility] for RD and/or RD on-call/weekend/after hours pager

## 2020-09-13 NOTE — Progress Notes (Signed)
PROGRESS NOTE  Brittany Silva SWF:[id number] DOB: Aug 14, 1956   PCP: Carol Ada, MD  Patient is from: Home  DOA: 09/11/2020 LOS: 1  Chief complaints: Back pain  Brief Narrative / Interim history: 64 year old female with history of chronic back pain, DM-2, HTN, hypothyroidism, morbid obesity and recent UTI presenting with 5 days of back pain, bilateral leg pain, nausea, vomiting, fever and chills.  In ED, MRI lumbar spine concerning for lumbar epidural abscess around the L5-S1 disc space and soft tissue enhancement on the right posterior to the L4-5 and L5-S1 facet joints.  Neurosurgery consulted, and recommended transfer to [facility].   The next day, evaluated by neurosurgery IR biopsy for tissue diagnosis and ID consult for antibiotic therapy.  Subjective: Seen and examined earlier this morning.  No major events overnight of this morning.  Continues to endorse significant back pain radiating to her feet in all dermatomes.  She described the pain as spasm.  Rates her pain 7/10.  Denies acute sensory changes.  She has chronic neuropathy.  Denies bowel or bladder issues.  Objective: Vitals:   09/12/20 2000 09/13/20 0344 09/13/20 0719 09/13/20 0739  BP:  (!) 141/52 (!) 151/61 (!) 151/61  Pulse:  74 76 76  Resp:  17 16 16   Temp:  99 F (37.2 C) 98.8 F (37.1 C) 98.8 F (37.1 C)  TempSrc:  Oral Oral Oral  SpO2: 100% 95% 96%   Weight:    129.3 kg  Height:    5\' 7"  (1.702 m)    Intake/Output Summary (Last 24 hours) at 09/13/2020 1459 Last data filed at 09/13/2020 1400 Gross per 24 hour  Intake 480 ml  Output --  Net 480 ml   Filed Weights   09/13/20 0739  Weight: 129.3 kg    Examination:  GENERAL: No apparent distress.  Nontoxic. HEENT: MMM.  Vision and hearing grossly intact.  NECK: Supple.  No apparent JVD.  RESP:  No IWOB.  Fair aeration bilaterally. CVS:  RRR. Heart sounds normal.  ABD/GI/GU: BS+. Abd soft, NTND.  MSK/EXT:  Moves extremities. No apparent  deformity. No edema.  SKIN: no apparent skin lesion or wound NEURO: AAOx4.  Motor 4/5 in BLE.  Light sensation intact.  Patellar reflex symmetric. PSYCH: Calm. Normal affect.  Procedures:  None  Microbiology summarized: COVID-19 PCR negative.  Assessment & Plan: Acute on chronic low back pain/radiculopathy L5-S1 epidural abscess-no signs of cauda equina.  -Neurosurgery consulted and recommended IR biopsy and ID consult for antibiotics. -IR consulted -Pain control with as needed Tylenol and IV fentanyl. -Start broad-spectrum antibiotics after biopsy.  Uncontrolled DM-2 with hypo- and hyperglycemia: On Levemir 75 units twice daily and SSI.  A1c 8.6%. Recent Labs  Lab 09/11/20 2237 09/12/20 1735 09/12/20 2044 09/13/20 0644 09/13/20 1237  GLUCAP 73 65* 165* 189* 186*  -Increase SSI to moderate -Add mealtime coverage -Continue atorvastatin  Essential hypertension: BP slightly elevated. -Resume home amlodipine -Continue holding losartan.  Hypothyroidism -Continue home Synthroid  Hypokalemia-a.m. labs were not drawn. -Recheck and replenish  Hyponatremia-a.m. labs were not drawn. -Recheck.  Acute cystitis-treated with IV ceftriaxone x1 and Keflex x3 days -Should be sufficient  Morbid obesity Body mass index is 44.64 kg/m. Nutrition Problem: Increased nutrient needs Etiology: acute illness Signs/Symptoms: estimated needs Interventions: MVI, Prostat, Boost Breeze   DVT prophylaxis:  Place and maintain sequential compression device Start: 09/12/20 1806  Code Status: Full code Family Communication: Patient and/or RN. Available if any question.  Status is: Inpatient  Remains  inpatient appropriate because:Ongoing diagnostic testing needed not appropriate for outpatient work up, IV treatments appropriate due to intensity of illness or inability to take PO and Inpatient level of care appropriate due to severity of illness   Dispo: The patient is from: Home               Anticipated d/c is to: Home              Anticipated d/c date is: > 3 days              Patient currently is not medically stable to d/c.       Consultants:  Neurosurgery IR   Sch Meds:  Scheduled Meds: . (feeding supplement) PROSource Plus  30 mL Oral BID BM  . feeding supplement  1 Container Oral TID BM  . insulin aspart  0-5 Units Subcutaneous QHS  . insulin aspart  0-9 Units Subcutaneous TID WC  . levothyroxine  75 mcg Oral Q0600  . multivitamin with minerals  1 tablet Oral Daily   Continuous Infusions: PRN Meds:.acetaminophen **OR** acetaminophen, fentaNYL (SUBLIMAZE) injection, hydrALAZINE, ondansetron **OR** ondansetron (ZOFRAN) IV  Antimicrobials: Anti-infectives (From admission, onward)   None       I have personally reviewed the following labs and images: CBC: Recent Labs  Lab 09/08/20 2112 09/11/20 2239 09/12/20 1811  WBC 12.0* 10.7* 11.7*  NEUTROABS 10.3*  --   --   HGB 13.9 12.7 13.5  HCT 40.9 38.4 40.5  MCV 85.4 87.3 88.2  PLT 165 265 320   BMP &GFR Recent Labs  Lab 09/08/20 2112 09/11/20 2239 09/12/20 1811  NA 130* 134*  --   K 4.1 3.1*  --   CL 91* 97*  --   CO2 25 28  --   GLUCOSE 208* 76  --   BUN 27* 17  --   CREATININE 1.08* 0.89 0.68  CALCIUM 8.2* 8.1*  --   MG  --   --  2.4   Estimated Creatinine Clearance: 99.5 mL/min (by C-G formula based on SCr of 0.68 mg/dL). Liver & Pancreas: Recent Labs  Lab 09/08/20 2112 09/11/20 2359  AST 34 37  ALT 20 22  ALKPHOS 110 128*  BILITOT 1.5* 0.6  PROT 7.2 6.9  ALBUMIN 3.3* 2.8*   Recent Labs  Lab 09/08/20 2112  LIPASE 24   No results for input(s): AMMONIA in the last 168 hours. Diabetic: Recent Labs    09/12/20 1812  HGBA1C 8.6*   Recent Labs  Lab 09/11/20 2237 09/12/20 1735 09/12/20 2044 09/13/20 0644 09/13/20 1237  GLUCAP 73 65* 165* 189* 186*   Cardiac Enzymes: No results for input(s): CKTOTAL, CKMB, CKMBINDEX, TROPONINI in the last 168 hours. No results  for input(s): PROBNP in the last 8760 hours. Coagulation Profile: No results for input(s): INR, PROTIME in the last 168 hours. Thyroid Function Tests: No results for input(s): TSH, T4TOTAL, FREET4, T3FREE, THYROIDAB in the last 72 hours. Lipid Profile: No results for input(s): CHOL, HDL, LDLCALC, TRIG, CHOLHDL, LDLDIRECT in the last 72 hours. Anemia Panel: No results for input(s): VITAMINB12, FOLATE, FERRITIN, TIBC, IRON, RETICCTPCT in the last 72 hours. Urine analysis:    Component Value Date/Time   COLORURINE YELLOW 09/12/2020 0528   APPEARANCEUR CLEAR 09/12/2020 0528   LABSPEC 1.022 09/12/2020 0528   PHURINE 5.0 09/12/2020 0528   GLUCOSEU >=500 (A) 09/12/2020 0528   HGBUR NEGATIVE 09/12/2020 0528   HGBUR trace-lysed 05/16/2008 0849   BILIRUBINUR NEGATIVE 09/12/2020 0528  KETONESUR NEGATIVE 09/12/2020 0528   PROTEINUR NEGATIVE 09/12/2020 0528   UROBILINOGEN 0.2 05/16/2008 0849   NITRITE NEGATIVE 09/12/2020 0528   LEUKOCYTESUR NEGATIVE 09/12/2020 0528   Sepsis Labs: Invalid input(s): PROCALCITONIN, [place]  Microbiology: Recent Results (from the past 240 hour(s))  Urine culture     Status: Abnormal   Collection Time: 09/08/20 10:30 PM   Specimen: Urine, Random  Result Value Ref Range Status   Specimen Description   Final    URINE, RANDOM Performed at [facility], [address] [place], [place] [address]    Special Requests   Final    NONE Performed at [facility], [address] [place], [place] [address]    Culture 80,000 COLONIES/mL ESCHERICHIA COLI (A)  Final   Report Status 09/11/2020 FINAL  Final   Organism ID, Bacteria ESCHERICHIA COLI (A)  Final      Susceptibility   Escherichia coli - MIC*    AMPICILLIN >=32 RESISTANT Resistant     CEFAZOLIN <=4 SENSITIVE Sensitive     CEFTRIAXONE <=0.25 SENSITIVE Sensitive     CIPROFLOXACIN <=0.25 SENSITIVE Sensitive     GENTAMICIN <=1 SENSITIVE Sensitive     IMIPENEM <=0.25 SENSITIVE  Sensitive     NITROFURANTOIN <=16 SENSITIVE Sensitive     TRIMETH/SULFA <=20 SENSITIVE Sensitive     AMPICILLIN/SULBACTAM 8 SENSITIVE Sensitive     PIP/TAZO <=4 SENSITIVE Sensitive     * 80,000 COLONIES/mL ESCHERICHIA COLI  Respiratory Panel by RT PCR (Flu A&B, Covid) - Nasopharyngeal Swab     Status: None   Collection Time: 09/12/20  1:43 PM   Specimen: Nasopharyngeal Swab  Result Value Ref Range Status   SARS Coronavirus 2 by RT PCR NEGATIVE NEGATIVE Final    Comment: (NOTE) SARS-CoV-2 target nucleic acids are NOT DETECTED.  The SARS-CoV-2 RNA is generally detectable in upper respiratoy specimens during the acute phase of infection. The lowest concentration of SARS-CoV-2 viral copies this assay can detect is 131 copies/mL. A negative result does not preclude SARS-Cov-2 infection and should not be used as the sole basis for treatment or other patient management decisions. A negative result may occur with  improper specimen collection/handling, submission of specimen other than nasopharyngeal swab, presence of viral mutation(s) within the areas targeted by this assay, and inadequate number of viral copies (<131 copies/mL). A negative result must be combined with clinical observations, patient history, and epidemiological information. The expected result is Negative.  Fact Sheet for Patients:  PinkCheek.be  Fact Sheet for Healthcare Providers:  GravelBags.it  This test is no t yet approved or cleared by the [place] FDA and  has been authorized for detection and/or diagnosis of SARS-CoV-2 by FDA under an Emergency Use Authorization (EUA). This EUA will remain  in effect (meaning this test can be used) for the duration of the COVID-19 declaration under Section 564(b)(1) of the Act, 21 U.S.C. section 360bbb-3(b)(1), unless the authorization is terminated or revoked sooner.     Influenza A by PCR NEGATIVE NEGATIVE  Final   Influenza B by PCR NEGATIVE NEGATIVE Final    Comment: (NOTE) The Xpert Xpress SARS-CoV-2/FLU/RSV assay is intended as an aid in  the diagnosis of influenza from Nasopharyngeal swab specimens and  should not be used as a sole basis for treatment. Nasal washings and  aspirates are unacceptable for Xpert Xpress SARS-CoV-2/FLU/RSV  testing.  Fact Sheet for Patients: PinkCheek.be  Fact Sheet for Healthcare Providers: GravelBags.it  This test is not  yet approved or cleared by the [place] and  has been authorized for detection and/or diagnosis of SARS-CoV-2 by  FDA under an Emergency Use Authorization (EUA). This EUA will remain  in effect (meaning this test can be used) for the duration of the  Covid-19 declaration under Section 564(b)(1) of the Act, 21  U.S.C. section 360bbb-3(b)(1), unless the authorization is  terminated or revoked. Performed at [facility], [place] [address], [place], [place] [address]     Radiology Studies: No results found.   Freja Faro T. [place]  If 7PM-7AM, please contact night-coverage [email or web address] 09/13/2020, 2:59 PM

## 2020-09-13 NOTE — Consult Note (Signed)
   Providing Compassionate, Quality Care - Together  Neurosurgery Consult  Referring physician: ED Reason for referral: Possible epidural abscess  Chief Complaint: Low back pain  History of Present Illness: This is a 64 year old female with a history of diabetes chronic low back pain, hypertension, hypothyroidism that has been complaining of nausea vomiting, back and right leg pain for approximately 5 to 6 days.  Pain started on the right side of her low back although she has had chronic low back pain for many years.  She also recently had complaints of urinary frequency and was recently treated for UTI with cephalexin.  Her pain and nausea vomiting continued therefore she came to the emergency department.  At this time she complains of continued low back pain and does not have right leg pain at this time.  She does have pain limited weakness in her legs.  She denies any groin numbness or saddle anesthesia.  She denies any bowel or bladder incontinence.  Denies fever.    Medications: I have reviewed the patient's current medications. Allergies: No Known Allergies  History reviewed. No pertinent family history. Social History:  has no history on file for tobacco use, alcohol use, and drug use.  ROS: 14 point review of systems was obtained which all pertinent positives and negatives are listed in HPI above  Physical Exam:  Vital signs in last 24 hours: Temp:  [98 F (36.7 C)-98.3 F (36.8 C)] 98 F (36.7 C) (07/25 1814) Pulse Rate:  [58-128] 65 (07/26 0746) Resp:  [11-18] 14 (07/26 0217) BP: (138-182)/(65-125) 153/88 (07/26 0700) SpO2:  [91 %-98 %] 96 % (07/26 0746) PE: AOx3 No acute distress PERRLA EOMI SI LT Bilateral upper extremity 5/5 Bilateral lower extremity 4/5, pain limited weakness primarily in the low back Deep tendon reflexes normal in the lower extremities   Imaging: MRI and CAT scan of the lumbar spine reviewed.  Concern for multiple small soft tissue abscesses  and possible epidural phlegmon.  There is some moderate degenerative changes with moderate stenosis that is chronic appearing.  There is no significant signs of instability or bony erosion on the CT scan.  There appears to be a small amount of ventral and posterior fluid collection at L5-S1 concerning for an epidural abscess without significant stenosis.  There is also a 1 cm fluid collection posterior to the facet joints at L4-5/L5-S1 which may be an abscess as well.  Impression/Assessment:  64 year old female with  1.  L5-S1 epidural abscess, unknown etiology 2.  Chronic low back pain 3.  Diabetes mellitus  Plan:  -At this time I do not recommend any surgical intervention. -Recommend IR biopsy for tissue diagnosis -Consult ID for antibiotic therapy -Inflammatory markers elevated therefore most likely consistent with epidural abscess. -PT/OT eval -We will likely need long-term antibiotics, recommend waiting for biopsy before starting antibiotics as patient is hemodynamically stable -Please call with any questions or concerns -We will sign off at this time.  Can follow-up in my office in 4 weeks.    Thank you for allowing me to participate in this patient's care.  Please do not hesitate to call with questions or concerns.   Elwin Sleight, [facility] Cell: [phone]

## 2020-09-14 ENCOUNTER — Inpatient Hospital Stay (HOSPITAL_COMMUNITY): Payer: 59

## 2020-09-14 DIAGNOSIS — E876 Hypokalemia: Secondary | ICD-10-CM | POA: Diagnosis not present

## 2020-09-14 DIAGNOSIS — E871 Hypo-osmolality and hyponatremia: Secondary | ICD-10-CM | POA: Diagnosis not present

## 2020-09-14 DIAGNOSIS — I10 Essential (primary) hypertension: Secondary | ICD-10-CM | POA: Diagnosis not present

## 2020-09-14 DIAGNOSIS — G061 Intraspinal abscess and granuloma: Secondary | ICD-10-CM | POA: Diagnosis not present

## 2020-09-14 HISTORY — PX: IR RADIOLOGIST EVAL & MGMT: IMG5224

## 2020-09-14 LAB — CBC WITH DIFFERENTIAL/PLATELET
Abs Immature Granulocytes: 0.28 10*3/uL — ABNORMAL HIGH (ref 0.00–0.07)
Basophils Absolute: 0 10*3/uL (ref 0.0–0.1)
Basophils Relative: 0 %
Eosinophils Absolute: 0.2 10*3/uL (ref 0.0–0.5)
Eosinophils Relative: 2 %
HCT: 36.9 % (ref 36.0–46.0)
Hemoglobin: 12.3 g/dL (ref 12.0–15.0)
Immature Granulocytes: 3 %
Lymphocytes Relative: 8 %
Lymphs Abs: 0.8 10*3/uL (ref 0.7–4.0)
MCH: 28.7 pg (ref 26.0–34.0)
MCHC: 33.3 g/dL (ref 30.0–36.0)
MCV: 86.2 fL (ref 80.0–100.0)
Monocytes Absolute: 0.9 10*3/uL (ref 0.1–1.0)
Monocytes Relative: 8 %
Neutro Abs: 8.6 10*3/uL — ABNORMAL HIGH (ref 1.7–7.7)
Neutrophils Relative %: 79 %
Platelets: 417 10*3/uL — ABNORMAL HIGH (ref 150–400)
RBC: 4.28 MIL/uL (ref 3.87–5.11)
RDW: 13.7 % (ref 11.5–15.5)
WBC: 10.8 10*3/uL — ABNORMAL HIGH (ref 4.0–10.5)
nRBC: 0 % (ref 0.0–0.2)

## 2020-09-14 LAB — GLUCOSE, CAPILLARY
Glucose-Capillary: 198 mg/dL — ABNORMAL HIGH (ref 70–99)
Glucose-Capillary: 203 mg/dL — ABNORMAL HIGH (ref 70–99)
Glucose-Capillary: 170 mg/dL — ABNORMAL HIGH (ref 70–99)
Glucose-Capillary: 309 mg/dL — ABNORMAL HIGH (ref 70–99)
Glucose-Capillary: 306 mg/dL — ABNORMAL HIGH (ref 70–99)

## 2020-09-14 LAB — SEDIMENTATION RATE: Sed Rate: 86 mm/hr — ABNORMAL HIGH (ref 0–22)

## 2020-09-14 LAB — MAGNESIUM: Magnesium: 2.1 mg/dL (ref 1.7–2.4)

## 2020-09-14 LAB — C-REACTIVE PROTEIN: CRP: 20.4 mg/dL — ABNORMAL HIGH (ref <1.0)

## 2020-09-14 LAB — PROTIME-INR
INR: 1.1 (ref 0.8–1.2)
Prothrombin Time: 14.1 seconds (ref 11.4–15.2)

## 2020-09-14 MED ORDER — SODIUM CHLORIDE 0.9 % IV SOLN
2.0000 g | Freq: Three times a day (TID) | INTRAVENOUS | Status: DC
  Administered 2020-09-14 – 2020-09-15 (×3): 2 g via INTRAVENOUS
  Filled 2020-09-14 (×3): qty 2

## 2020-09-14 MED ORDER — VANCOMYCIN HCL 1250 MG/250ML IV SOLN
1250.0000 mg | Freq: Two times a day (BID) | INTRAVENOUS | Status: DC
  Administered 2020-09-15 – 2020-09-17 (×5): 1250 mg via INTRAVENOUS
  Filled 2020-09-14 (×5): qty 250

## 2020-09-14 MED ORDER — MIDAZOLAM HCL 2 MG/2ML IJ SOLN
INTRAMUSCULAR | Status: AC | PRN
  Administered 2020-09-14: 1 mg via INTRAVENOUS

## 2020-09-14 MED ORDER — VANCOMYCIN HCL 10 G IV SOLR
2500.0000 mg | Freq: Once | INTRAVENOUS | Status: AC
  Administered 2020-09-14: 2500 mg via INTRAVENOUS
  Filled 2020-09-14: qty 2500

## 2020-09-14 MED ORDER — LIDOCAINE HCL (PF) 1 % IJ SOLN
INTRAMUSCULAR | Status: AC
  Filled 2020-09-14: qty 30

## 2020-09-14 MED ORDER — METRONIDAZOLE IN NACL 5-0.79 MG/ML-% IV SOLN
500.0000 mg | Freq: Three times a day (TID) | INTRAVENOUS | Status: DC
  Administered 2020-09-14 – 2020-09-15 (×3): 500 mg via INTRAVENOUS
  Filled 2020-09-14 (×4): qty 100

## 2020-09-14 MED ORDER — FENTANYL CITRATE (PF) 100 MCG/2ML IJ SOLN
INTRAMUSCULAR | Status: AC | PRN
  Administered 2020-09-14: 50 ug via INTRAVENOUS

## 2020-09-14 MED ORDER — FENTANYL CITRATE (PF) 100 MCG/2ML IJ SOLN
INTRAMUSCULAR | Status: AC
  Filled 2020-09-14: qty 2

## 2020-09-14 MED ORDER — INSULIN DETEMIR 100 UNIT/ML ~~LOC~~ SOLN
8.0000 [IU] | Freq: Two times a day (BID) | SUBCUTANEOUS | Status: DC
  Administered 2020-09-14: 8 [IU] via SUBCUTANEOUS
  Filled 2020-09-14 (×4): qty 0.08

## 2020-09-14 MED ORDER — MIDAZOLAM HCL 2 MG/2ML IJ SOLN
INTRAMUSCULAR | Status: AC
  Filled 2020-09-14: qty 2

## 2020-09-14 NOTE — Plan of Care (Signed)

## 2020-09-14 NOTE — Plan of Care (Signed)

## 2020-09-14 NOTE — Procedures (Signed)
  Procedure: Right L4-5 facet aspiration under fluoro 38ml thin purulent for GS, C&S EBL:   minimal Complications:  none immediate  See full dictation in [facility].  Dillard Cannon MD Main # [phone] Pager  [phone] Mobile [phone]

## 2020-09-14 NOTE — Progress Notes (Signed)
PROGRESS NOTE  Brittany Silva DXI:[id number] DOB: [birth year]   PCP: Carol Ada, MD  Patient is from: Home  DOA: 09/11/2020 LOS: 2  Chief complaints: Back pain  Brief Narrative / Interim history: 64 year old female with history of chronic back pain, DM-2, HTN, hypothyroidism, morbid obesity and recent UTI presenting with 5 days of back pain, bilateral leg pain, nausea, vomiting, fever and chills.  In ED, MRI lumbar spine concerning for lumbar epidural abscess around the L5-S1 disc space and soft tissue enhancement on the right posterior to the L4-5 and L5-S1 facet joints.  Neurosurgery consulted, and recommended transfer to [facility].   The next day, evaluated by neurosurgery who recommended IR biopsy for tissue diagnosis and ID consult for antibiotic therapy. IR consulted.  Patient underwent fluoroscopy guided needle aspiration.  Tissue culture sent.  Started on IV vancomycin, cefepime and Flagyl.  Subjective: Seen and examined earlier this morning before she went down for IR procedure.  No major events overnight or this morning.  Pain fairly controlled.  She rates her pain 4/10.  Reports some pressure sensation when she urinates.  No other bowel or bladder issue.  No new focal neuro deficits.  Objective: Vitals:   09/14/20 1330 09/14/20 1335 09/14/20 1340 09/14/20 1349  BP: 134/63 104/72 (!) 121/57 133/69  Pulse: 90 84 88 89  Resp: 18 19 18 16   Temp:      TempSrc:      SpO2: 96% 95% 94% 96%  Weight:      Height:        Intake/Output Summary (Last 24 hours) at 09/14/2020 1431 Last data filed at 09/14/2020 0900 Gross per 24 hour  Intake 120 ml  Output --  Net 120 ml   Filed Weights   09/13/20 0739  Weight: 129.3 kg    Examination:  GENERAL: No apparent distress.  Nontoxic. HEENT: MMM.  Vision and hearing grossly intact.  NECK: Supple.  No apparent JVD.  RESP: On room air.  No IWOB.  Fair aeration bilaterally. CVS:  RRR. Heart sounds normal.  ABD/GI/GU:  BS+. Abd soft, NTND.  MSK/EXT:  Moves extremities. No apparent deformity. No edema.  SKIN: no apparent skin lesion or wound NEURO: AAOx4..  Motor 4/5 in BLE.  Light sensation intact.  Patellar reflex symmetric. PSYCH: Calm. Normal affect.  Procedures:  10/14-fluoroscopy guided needle aspiration of facet joints  Microbiology summarized: COVID-19 PCR negative.  Assessment & Plan: Acute on chronic low back pain/radiculopathy L5-S1 epidural abscess-no signs of cauda equina L4-5 and L5-S1 facet joint infection.  -Neurosurgery consulted and recommended IR biopsy and ID consult for antibiotics. -Underwent fluoroscopy guided needle aspiration of the facet joints. -Pain control with as needed Tylenol and IV fentanyl. -Start broad-spectrum antibiotics-vancomycin, cefepime and Flagyl -Follow surgical tissue cultures.  Uncontrolled DM-2 with hypo- and hyperglycemia: On Levemir 75 units twice daily and SSI.  A1c 8.6%. Recent Labs  Lab 09/13/20 1608 09/13/20 2016 09/14/20 0238 09/14/20 0709 09/14/20 1136  GLUCAP 208* 171* 198* 203* 170*  -Continue SSI-moderate -Continue NovoLog AC 6 units -On Levemir 8 units twice daily -Continue atorvastatin  Essential hypertension: Normotensive. -Continue home amlodipine -Continue holding losartan.  Hypothyroidism -Continue home Synthroid  Hypokalemia-a.m. labs were not drawn. -Recheck and replenish  Hyponatremia -Recheck.  Acute cystitis-treated with IV ceftriaxone x1 and Keflex x3 days -Should be covered with antibiotics as above.  Morbid obesity Body mass index is 44.64 kg/m. Nutrition Problem: Increased nutrient needs Etiology: acute illness Signs/Symptoms: estimated needs Interventions: MVI, Prostat, Boost  Breeze   DVT prophylaxis:  Place and maintain sequential compression device Start: 09/12/20 1806  Code Status: Full code Family Communication: Patient and/or RN. Available if any question.  Status is: Inpatient  Remains  inpatient appropriate because:Ongoing diagnostic testing needed not appropriate for outpatient work up, IV treatments appropriate due to intensity of illness or inability to take PO and Inpatient level of care appropriate due to severity of illness   Dispo: The patient is from: Home              Anticipated d/c is to: Home              Anticipated d/c date is: > 3 days              Patient currently is not medically stable to d/c.       Consultants:  Neurosurgery IR   Sch Meds:  Scheduled Meds: . (feeding supplement) PROSource Plus  30 mL Oral BID BM  . amLODipine  10 mg Oral Daily  . atorvastatin  40 mg Oral Daily  . DULoxetine  90 mg Oral Daily  . feeding supplement  1 Container Oral TID BM  . insulin aspart  0-15 Units Subcutaneous TID WC  . insulin aspart  0-5 Units Subcutaneous QHS  . insulin aspart  6 Units Subcutaneous TID WC  . levothyroxine  75 mcg Oral Q0600  . lidocaine (PF)      . multivitamin with minerals  1 tablet Oral Daily   Continuous Infusions: . ceFEPime (MAXIPIME) IV    . metronidazole    . vancomycin    . [START ON 09/15/2020] vancomycin     PRN Meds:.acetaminophen **OR** acetaminophen, fentaNYL (SUBLIMAZE) injection, hydrALAZINE, ondansetron **OR** ondansetron (ZOFRAN) IV  Antimicrobials: Anti-infectives (From admission, onward)   Start     Dose/Rate Route Frequency Ordered Stop   09/15/20 0400  vancomycin (VANCOREADY) IVPB 1250 mg/250 mL        1,250 mg 166.7 mL/hr over 90 Minutes Intravenous Every 12 hours 09/14/20 1416     09/14/20 1500  vancomycin (VANCOCIN) 2,500 mg in sodium chloride 0.9 % 500 mL IVPB        2,500 mg 250 mL/hr over 120 Minutes Intravenous  Once 09/14/20 1410     09/14/20 1430  metroNIDAZOLE (FLAGYL) IVPB 500 mg        500 mg 100 mL/hr over 60 Minutes Intravenous Every 8 hours 09/14/20 1358     09/14/20 1430  ceFEPIme (MAXIPIME) 2 g in sodium chloride 0.9 % 100 mL IVPB        2 g 200 mL/hr over 30 Minutes Intravenous  Every 8 hours 09/14/20 1410         I have personally reviewed the following labs and images: CBC: Recent Labs  Lab 09/08/20 2112 09/11/20 2239 09/12/20 1811 09/13/20 1525 09/14/20 0106  WBC 12.0* 10.7* 11.7* 9.7 10.8*  NEUTROABS 10.3*  --   --  7.7 8.6*  HGB 13.9 12.7 13.5 12.3 12.3  HCT 40.9 38.4 40.5 36.5 36.9  MCV 85.4 87.3 88.2 85.9 86.2  PLT 165 265 320 378 417*   BMP &GFR Recent Labs  Lab 09/08/20 2112 09/11/20 2239 09/12/20 1811 09/13/20 1525 09/14/20 0106  NA 130* 134*  --  131*  --   K 4.1 3.1*  --  4.1  --   CL 91* 97*  --  95*  --   CO2 25 28  --  24  --   GLUCOSE  208* 76  --  219*  --   BUN 27* 17  --  8  --   CREATININE 1.08* 0.89 0.68 0.72  --   CALCIUM 8.2* 8.1*  --  8.1*  --   MG  --   --  2.4 2.1 2.1  PHOS  --   --   --  2.1*  --    Estimated Creatinine Clearance: 99.5 mL/min (by C-G formula based on SCr of 0.72 mg/dL). Liver & Pancreas: Recent Labs  Lab 09/08/20 2112 09/11/20 2359 09/13/20 1525  AST 34 37  --   ALT 20 22  --   ALKPHOS 110 128*  --   BILITOT 1.5* 0.6  --   PROT 7.2 6.9  --   ALBUMIN 3.3* 2.8* 2.4*   Recent Labs  Lab 09/08/20 2112  LIPASE 24   No results for input(s): AMMONIA in the last 168 hours. Diabetic: Recent Labs    09/12/20 1812  HGBA1C 8.6*   Recent Labs  Lab 09/13/20 1608 09/13/20 2016 09/14/20 0238 09/14/20 0709 09/14/20 1136  GLUCAP 208* 171* 198* 203* 170*   Cardiac Enzymes: No results for input(s): CKTOTAL, CKMB, CKMBINDEX, TROPONINI in the last 168 hours. No results for input(s): PROBNP in the last 8760 hours. Coagulation Profile: Recent Labs  Lab 09/14/20 0106  INR 1.1   Thyroid Function Tests: No results for input(s): TSH, T4TOTAL, FREET4, T3FREE, THYROIDAB in the last 72 hours. Lipid Profile: No results for input(s): CHOL, HDL, LDLCALC, TRIG, CHOLHDL, LDLDIRECT in the last 72 hours. Anemia Panel: No results for input(s): VITAMINB12, FOLATE, FERRITIN, TIBC, IRON, RETICCTPCT in  the last 72 hours. Urine analysis:    Component Value Date/Time   COLORURINE YELLOW 09/12/2020 0528   APPEARANCEUR CLEAR 09/12/2020 0528   LABSPEC 1.022 09/12/2020 0528   PHURINE 5.0 09/12/2020 0528   GLUCOSEU >=500 (A) 09/12/2020 0528   HGBUR NEGATIVE 09/12/2020 0528   HGBUR trace-lysed 05/16/2008 0849   BILIRUBINUR NEGATIVE 09/12/2020 0528   KETONESUR NEGATIVE 09/12/2020 0528   PROTEINUR NEGATIVE 09/12/2020 0528   UROBILINOGEN 0.2 05/16/2008 0849   NITRITE NEGATIVE 09/12/2020 0528   LEUKOCYTESUR NEGATIVE 09/12/2020 0528   Sepsis Labs: Invalid input(s): PROCALCITONIN, Pasatiempo  Microbiology: Recent Results (from the past 240 hour(s))  Urine culture     Status: Abnormal   Collection Time: 09/08/20 10:30 PM   Specimen: Urine, Random  Result Value Ref Range Status   Specimen Description   Final    URINE, RANDOM Performed at [facility], [address] [place], [place] [address]    Special Requests   Final    NONE Performed at [facility], [address] [place], [place] [address]    Culture 80,000 COLONIES/mL ESCHERICHIA COLI (A)  Final   Report Status 09/11/2020 FINAL  Final   Organism ID, Bacteria ESCHERICHIA COLI (A)  Final      Susceptibility   Escherichia coli - MIC*    AMPICILLIN >=32 RESISTANT Resistant     CEFAZOLIN <=4 SENSITIVE Sensitive     CEFTRIAXONE <=0.25 SENSITIVE Sensitive     CIPROFLOXACIN <=0.25 SENSITIVE Sensitive     GENTAMICIN <=1 SENSITIVE Sensitive     IMIPENEM <=0.25 SENSITIVE Sensitive     NITROFURANTOIN <=16 SENSITIVE Sensitive     TRIMETH/SULFA <=20 SENSITIVE Sensitive     AMPICILLIN/SULBACTAM 8 SENSITIVE Sensitive     PIP/TAZO <=4 SENSITIVE Sensitive     * 80,000 COLONIES/mL ESCHERICHIA COLI  Respiratory Panel by  RT PCR (Flu A&B, Covid) - Nasopharyngeal Swab     Status: None   Collection Time: 09/12/20  1:43 PM   Specimen: Nasopharyngeal Swab  Result Value Ref Range Status   SARS Coronavirus 2 by RT PCR  NEGATIVE NEGATIVE Final    Comment: (NOTE) SARS-CoV-2 target nucleic acids are NOT DETECTED.  The SARS-CoV-2 RNA is generally detectable in upper respiratoy specimens during the acute phase of infection. The lowest concentration of SARS-CoV-2 viral copies this assay can detect is 131 copies/mL. A negative result does not preclude SARS-Cov-2 infection and should not be used as the sole basis for treatment or other patient management decisions. A negative result may occur with  improper specimen collection/handling, submission of specimen other than nasopharyngeal swab, presence of viral mutation(s) within the areas targeted by this assay, and inadequate number of viral copies (<131 copies/mL). A negative result must be combined with clinical observations, patient history, and epidemiological information. The expected result is Negative.  Fact Sheet for Patients:  PinkCheek.be  Fact Sheet for Healthcare Providers:  GravelBags.it  This test is no t yet approved or cleared by the [place] FDA and  has been authorized for detection and/or diagnosis of SARS-CoV-2 by FDA under an Emergency Use Authorization (EUA). This EUA will remain  in effect (meaning this test can be used) for the duration of the COVID-19 declaration under Section 564(b)(1) of the Act, 21 U.S.C. section 360bbb-3(b)(1), unless the authorization is terminated or revoked sooner.     Influenza A by PCR NEGATIVE NEGATIVE Final   Influenza B by PCR NEGATIVE NEGATIVE Final    Comment: (NOTE) The Xpert Xpress SARS-CoV-2/FLU/RSV assay is intended as an aid in  the diagnosis of influenza from Nasopharyngeal swab specimens and  should not be used as a sole basis for treatment. Nasal washings and  aspirates are unacceptable for Xpert Xpress SARS-CoV-2/FLU/RSV  testing.  Fact Sheet for Patients: PinkCheek.be  Fact Sheet for  Healthcare Providers: GravelBags.it  This test is not yet approved or cleared by the [place] FDA and  has been authorized for detection and/or diagnosis of SARS-CoV-2 by  FDA under an Emergency Use Authorization (EUA). This EUA will remain  in effect (meaning this test can be used) for the duration of the  Covid-19 declaration under Section 564(b)(1) of the Act, 21  U.S.C. section 360bbb-3(b)(1), unless the authorization is  terminated or revoked. Performed at [facility], [place] [address] [place], [place] [address]     Radiology Studies: No results found.   Leandrea Ackley T. Pageland  If 7PM-7AM, please contact night-coverage [email or web address] 09/14/2020, 2:31 PM

## 2020-09-14 NOTE — Progress Notes (Signed)
Pharmacy Antibiotic Note  Brittany Silva is a 64 y.o. female admitted on 09/11/2020 with epidural abscess.  Pharmacy has been consulted for vancomycin and cefepime dosing.  WBC 10.8, afebrile, CRP 20, abscess aspiration cultures pending. Good renal function.   Plan: Vancomycin 2.5g x1 then 1250 Q12hr (goal trough 15-20) Cefepime 2g Q 8hr Metronidazole per MD  Monitor cultures, clinical status, renal fx, vanc level at SS Narrow abx as able and f/u duration    Height: 5\' 7"  (170.2 cm) Weight: 129.3 kg (285 lb) IBW/kg (Calculated) : 61.6  Temp (24hrs), Avg:98.3 F (36.8 C), Min:98.2 F (36.8 C), Max:98.3 F (36.8 C)  Recent Labs  Lab 09/08/20 2112 09/11/20 2239 09/12/20 1811 09/13/20 1525 09/14/20 0106  WBC 12.0* 10.7* 11.7* 9.7 10.8*  CREATININE 1.08* 0.89 0.68 0.72  --     Estimated Creatinine Clearance: 99.5 mL/min (by C-G formula based on SCr of 0.72 mg/dL).    Allergies  Allergen Reactions   Hydrochlorothiazide     rash   Lisinopril     Other reaction(s): Cough   Adhesive [Tape]    Codeine Phosphate     REACTION: throat swelling   Darvon Other (See Comments)    Jittering, skin problems   Darvon [Propoxyphene Hcl]     Hyperactivity; "makes me crazy"   Hydrocodone-Acetaminophen Itching    REACTION: unspecified, per pt throat itch and feel strange and also vomit   Propoxyphene Hcl     REACTION: Makes her crazy   Latex Rash   Neosporin [Neomycin-Bacitracin Zn-Polymyx] Rash   Sulfamethoxazole Itching and Rash    Antimicrobials this admission: Vanc 10/14>>  Cefe 10/14>> MTZ 10/14>>  Microbiology results: 10/14 abscess aspiration: pend   Thank you for allowing pharmacy to be a part of this patients care.   Benetta Spar, PharmD, BCPS, BCCP Clinical Pharmacist  Please check AMION for all [place] phone numbers After 10:00 PM, call [place] [phone]

## 2020-09-14 NOTE — Progress Notes (Addendum)
Inpatient Diabetes Program Recommendations  AACE/ADA: New Consensus Statement on Inpatient Glycemic Control  Target Ranges:  Prepandial:   less than 140 mg/dL      Peak postprandial:   less than 180 mg/dL (1-2 hours)      Critically ill patients:  140 - 180 mg/dL   Results for Brittany Silva, Brittany Silva (MRN [id number]) as of 09/14/2020 08:22  Ref. Range 09/13/2020 06:44 09/13/2020 12:37 09/13/2020 16:08 09/13/2020 20:16 09/14/2020 02:38 09/14/2020 07:09  Glucose-Capillary Latest Ref Range: 70 - 99 mg/dL 189 (H) 186 (H) 208 (H) 171 (H) 198 (H) 203 (H)  Results for Brittany Silva, Brittany Silva (MRN [id number]) as of 09/14/2020 08:22  Ref. Range 09/12/2020 18:12  Hemoglobin A1C Latest Ref Range: 4.8 - 5.6 % 8.6 (H)   Review of Glycemic Control  Diabetes history: DM2 Outpatient Diabetes medications: Levemir 75 units BID, Humalog 5-50 units TID with meals (depending on glucose and if eating) Current orders for Inpatient glycemic control: Novolog 0-15 units TID with meals, Novolog 0-5 units QHS, Novolog 6 units TID with meals for meal coverage  Inpatient Diabetes Program Recommendations:    Insulin: Please consider ordering Levemir 7 units Q24H.  HbgA1C:  A1C 8.6% on 09/12/20 indicating an average glucose of 200 mg/dl over the past 2-3 months. Patient notes that her prior A1C was 9.3% about 6 weeks ago.  NOTE: Spoke with patient over the phone about diabetes and home regimen for diabetes control. Patient reports being followed by Dr. Chalmers Cater for diabetes management and she last seen Dr. Chalmers Cater about 6 weeks ago. Patient reports she is currently taking Levemir 75 units BID and Humalog 5-50 units TID with meals as an outpatient for diabetes control. Patient reports that she was started on Humalog 6 weeks ago and she was given a sliding scale to use but it only goes up to 350 mg/dl (take 30 units if 300-350 mg/dl) and she has taken up to 50 units if glucose is higher than scale because she is not sure how much more she  was suppose to take if glucose was higher than scale. Patient also notes that she skips Levemir at times if glucose is low. Patient reports glucose has been as low as 60 mg/dl over the past week and she notes wide fluctuations in glucose every day. Patient reports that she has not been feeling well over the past week and she has not been eating consistently. Patient states she was unsure whether she was suppose to take the Humalog per the scale even if she was not going to eat.  Discussed Levemir and Humalog insulin in more detail and how they work. Explained that if she skips Levemir completely the glucose is likely going high before next dose of Levemir is due or taken.  Discussed meal coverage insulin and correction insulin. Encouraged patient to talk with Dr. Chalmers Cater to get more specific information on how much insulin she needs to cover meals and how much she needs to take for correction. Discussed A1C of 8.6% on 09/12/20 indicating an average glucose of 200 mg/dl. Patient reports that prior A1C was 9.3% 6 weeks ago. Discussed glucose and A1C goals.   Patient states she is checking glucose at least 3 times a day and often more times than that. Inquired about using a glucose monitoring sensor. Patient states that she tried to use a sensor several years ago but she had a skin allergy to the adhesive on the sensor so she was unable to use it. Discussed trying skin  barriers between skin and sensor adhesive to see if it helps. Patient is very interested in using a glucose monitoring sensor as long as she does not have an allergy to the adhesive. Discussed FreeStyle Libre2 and potentially using it along with a skin barrier. Patient would like to try the FreeStyle Libre2 sensor; will ask MD for order to provide sample.   Encouraged patient to check glucose at least 3-4 times per day (before meals and at bedtime) and to keep a log book of glucose readings, meal intake, and exact insulin dose taken which patient will need  to take to follow up appointments with Dr. Chalmers Cater. Explained how Dr. Chalmers Cater can use the log book to continue to make adjustments with DM medications if needed.  Patient verbalized understanding of information discussed and reports no further questions at this time related to diabetes.  Addendum 09/14/20@11 :28-Provided patient with samples of FreeStyle Libre2 sensor and reader (permission to provide by Dr. Cyndia Skeeters). Patient states she tried to use [name] several years ago but the adhesive irritated her skin. Encouraged patient to use skin barrier on skin prior to applying new sensor.  Discussed FreeStyle Libre2 and how to apply and use for glucose monitoring. Encouraged patient to watch youtube video on how to apply if she needed reminder. Placed samples in patient bag in room. Patient verbalized understanding of information discussed and states she will try to use the FreeStyle Libre2 once she is at home.   Thanks, Barnie Alderman, RN, MSN, CDE Diabetes Coordinator Inpatient Diabetes Program [phone] (Team Pager)

## 2020-09-15 ENCOUNTER — Inpatient Hospital Stay (HOSPITAL_COMMUNITY): Payer: 59

## 2020-09-15 DIAGNOSIS — I517 Cardiomegaly: Secondary | ICD-10-CM

## 2020-09-15 DIAGNOSIS — R5081 Fever presenting with conditions classified elsewhere: Secondary | ICD-10-CM | POA: Diagnosis not present

## 2020-09-15 DIAGNOSIS — E11621 Type 2 diabetes mellitus with foot ulcer: Secondary | ICD-10-CM

## 2020-09-15 DIAGNOSIS — E871 Hypo-osmolality and hyponatremia: Secondary | ICD-10-CM | POA: Diagnosis not present

## 2020-09-15 DIAGNOSIS — G061 Intraspinal abscess and granuloma: Secondary | ICD-10-CM | POA: Diagnosis not present

## 2020-09-15 DIAGNOSIS — I10 Essential (primary) hypertension: Secondary | ICD-10-CM | POA: Diagnosis not present

## 2020-09-15 DIAGNOSIS — M25579 Pain in unspecified ankle and joints of unspecified foot: Secondary | ICD-10-CM

## 2020-09-15 DIAGNOSIS — I348 Other nonrheumatic mitral valve disorders: Secondary | ICD-10-CM

## 2020-09-15 DIAGNOSIS — L97529 Non-pressure chronic ulcer of other part of left foot with unspecified severity: Secondary | ICD-10-CM

## 2020-09-15 DIAGNOSIS — E876 Hypokalemia: Secondary | ICD-10-CM | POA: Diagnosis not present

## 2020-09-15 DIAGNOSIS — G062 Extradural and subdural abscess, unspecified: Secondary | ICD-10-CM

## 2020-09-15 LAB — RENAL FUNCTION PANEL
Albumin: 2.3 g/dL — ABNORMAL LOW (ref 3.5–5.0)
Anion gap: 11 (ref 5–15)
BUN: 15 mg/dL (ref 8–23)
CO2: 23 mmol/L (ref 22–32)
Calcium: 8 mg/dL — ABNORMAL LOW (ref 8.9–10.3)
Chloride: 97 mmol/L — ABNORMAL LOW (ref 98–111)
Creatinine, Ser: 0.77 mg/dL (ref 0.44–1.00)
GFR, Estimated: >60 mL/min (ref >60)
Glucose, Bld: 279 mg/dL — ABNORMAL HIGH (ref 70–99)
Phosphorus: 3.3 mg/dL (ref 2.5–4.6)
Potassium: 3.9 mmol/L (ref 3.5–5.1)
Sodium: 131 mmol/L — ABNORMAL LOW (ref 135–145)

## 2020-09-15 LAB — ECHOCARDIOGRAM COMPLETE
Area-P 1/2: 4.6 cm2
Height: 67 in
S' Lateral: 3.2 cm
Weight: 4559.99 oz

## 2020-09-15 LAB — GLUCOSE, CAPILLARY
Glucose-Capillary: 255 mg/dL — ABNORMAL HIGH (ref 70–99)
Glucose-Capillary: 277 mg/dL — ABNORMAL HIGH (ref 70–99)
Glucose-Capillary: 148 mg/dL — ABNORMAL HIGH (ref 70–99)
Glucose-Capillary: 137 mg/dL — ABNORMAL HIGH (ref 70–99)

## 2020-09-15 LAB — CBC
HCT: 35 % — ABNORMAL LOW (ref 36.0–46.0)
Hemoglobin: 11.4 g/dL — ABNORMAL LOW (ref 12.0–15.0)
MCH: 28.2 pg (ref 26.0–34.0)
MCHC: 32.6 g/dL (ref 30.0–36.0)
MCV: 86.6 fL (ref 80.0–100.0)
Platelets: 413 10*3/uL — ABNORMAL HIGH (ref 150–400)
RBC: 4.04 MIL/uL (ref 3.87–5.11)
RDW: 13.6 % (ref 11.5–15.5)
WBC: 11.1 10*3/uL — ABNORMAL HIGH (ref 4.0–10.5)
nRBC: 0 % (ref 0.0–0.2)

## 2020-09-15 LAB — MAGNESIUM: Magnesium: 2.3 mg/dL (ref 1.7–2.4)

## 2020-09-15 IMAGING — CR DG ANKLE 2V *L*
2 series · 2 of 2 positions shown · non-contrast
Comparison: Left foot x-rays dated [DATE].

CLINICAL DATA: Chronic left ankle pain.

EXAM:
LEFT ANKLE - 2 VIEW

[ankle ap]
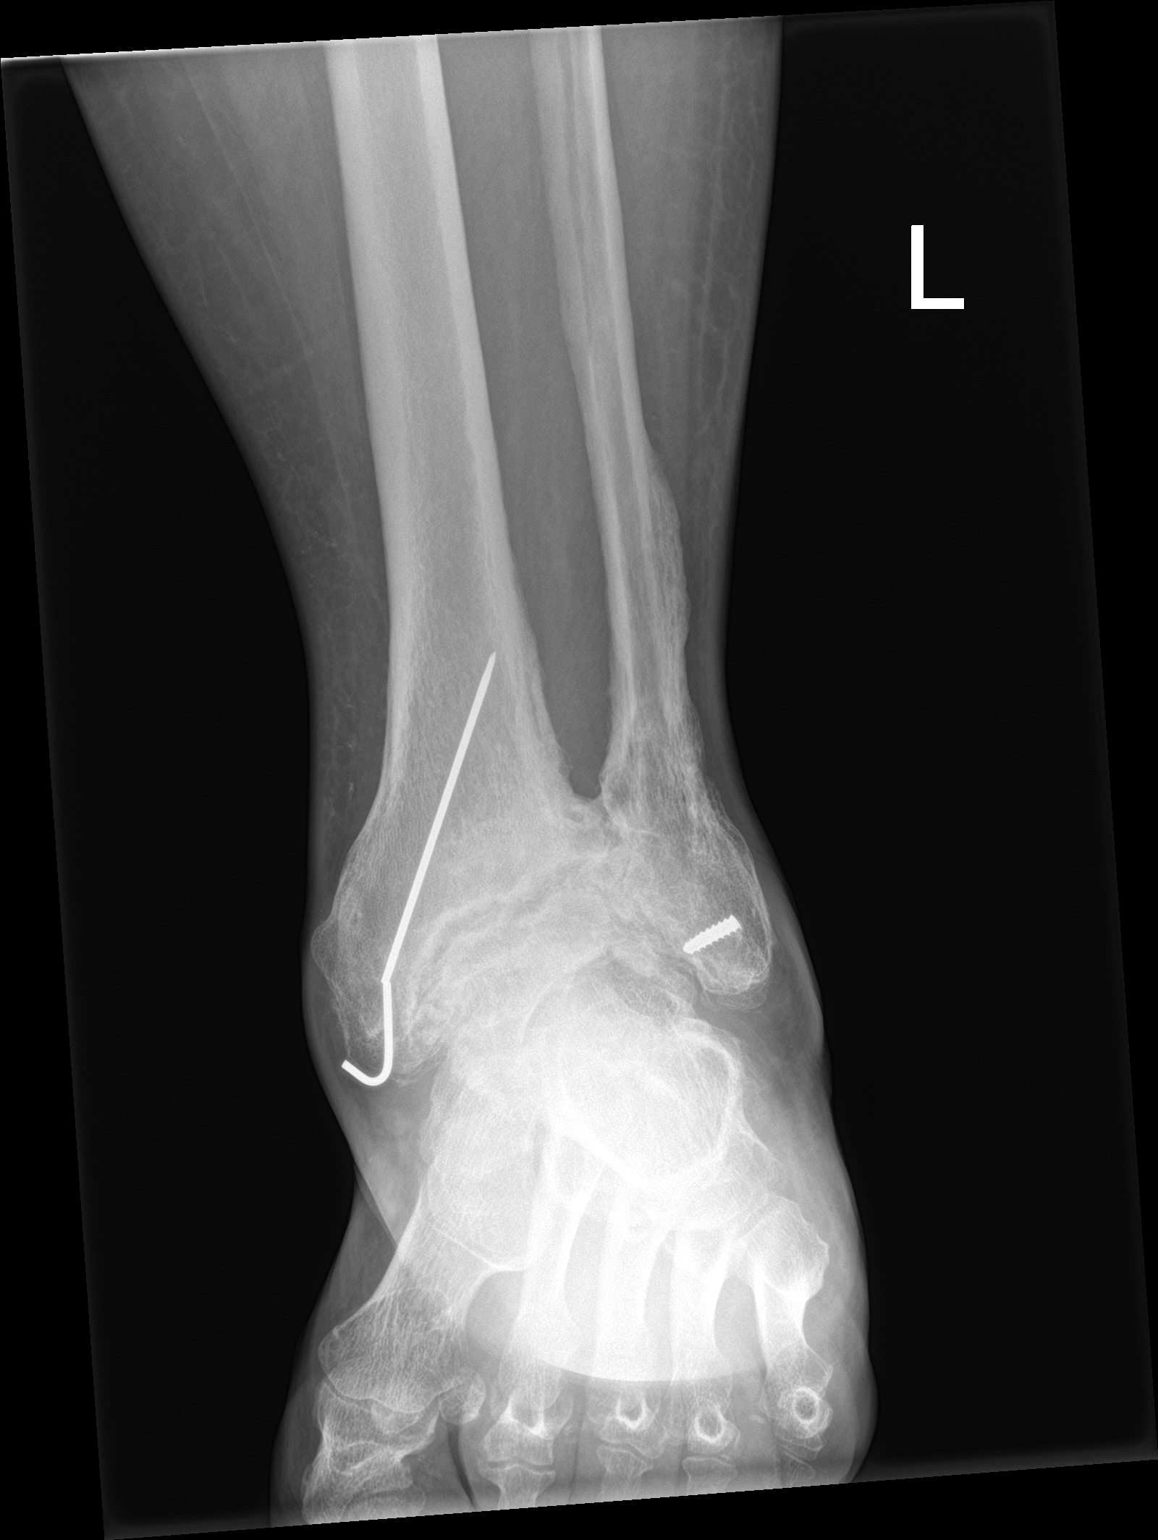

[ankle lat]
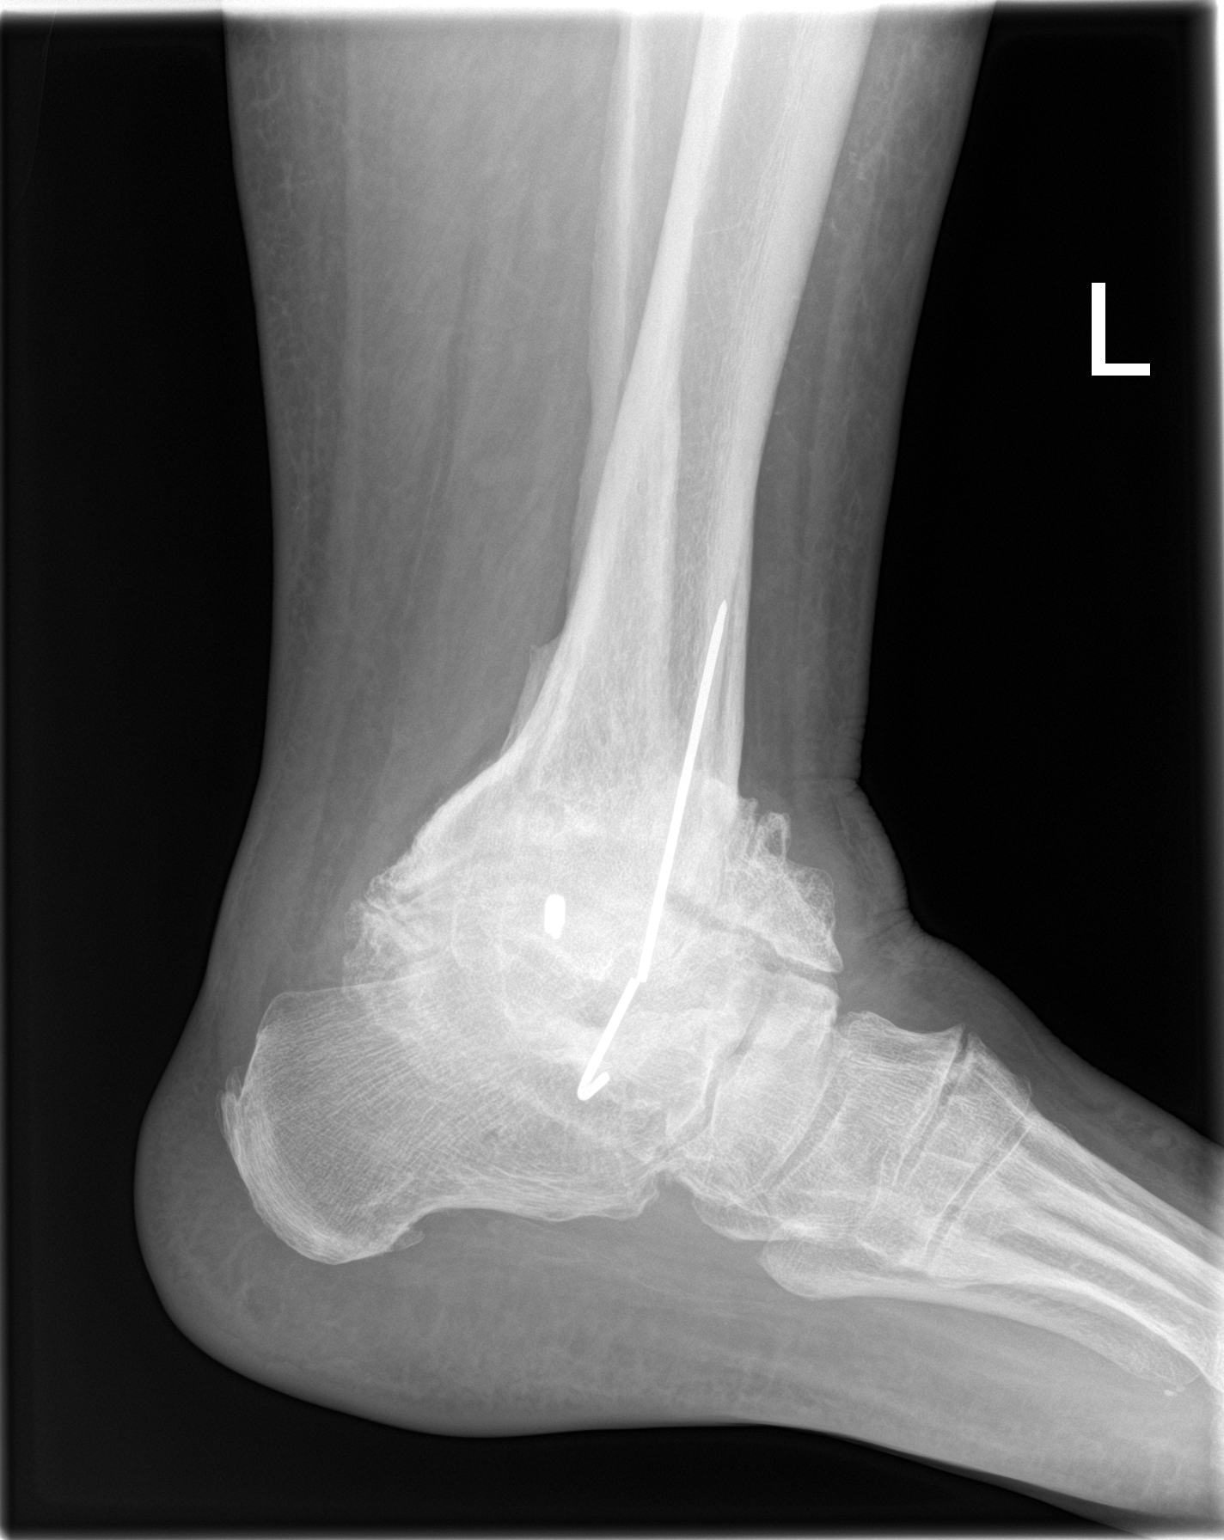

[2 of 2 positions shown; findings below may reference images not displayed]

FINDINGS: No acute fracture or dislocation. Chronic fracture of the medial
malleolar pin. Abandoned screw in the lateral malleolus. Severe
tibiotalar osteoarthritis with joint space narrowing and marked bony
hypertrophy. Mild dorsal degenerative spurring of the second TMT
joint. Bone mineralization is normal. Soft tissues are unremarkable.
IMPRESSION: 1. Severe post-traumatic tibiotalar osteoarthritis.

## 2020-09-15 MED ORDER — TRAMADOL HCL 50 MG PO TABS
50.0000 mg | ORAL_TABLET | Freq: Three times a day (TID) | ORAL | Status: DC
  Administered 2020-09-15 – 2020-09-24 (×28): 50 mg via ORAL
  Filled 2020-09-15 (×28): qty 1

## 2020-09-15 MED ORDER — ACETAMINOPHEN 500 MG PO TABS
1000.0000 mg | ORAL_TABLET | Freq: Three times a day (TID) | ORAL | Status: DC
  Administered 2020-09-15 – 2020-09-24 (×28): 1000 mg via ORAL
  Filled 2020-09-15 (×28): qty 2

## 2020-09-15 MED ORDER — INSULIN ASPART 100 UNIT/ML ~~LOC~~ SOLN
12.0000 [IU] | Freq: Three times a day (TID) | SUBCUTANEOUS | Status: DC
  Administered 2020-09-16 – 2020-09-18 (×7): 12 [IU] via SUBCUTANEOUS

## 2020-09-15 MED ORDER — INSULIN ASPART 100 UNIT/ML ~~LOC~~ SOLN
0.0000 [IU] | Freq: Every day | SUBCUTANEOUS | Status: DC
  Administered 2020-09-21: 4 [IU] via SUBCUTANEOUS
  Administered 2020-09-22: 2 [IU] via SUBCUTANEOUS

## 2020-09-15 MED ORDER — INSULIN ASPART 100 UNIT/ML ~~LOC~~ SOLN
8.0000 [IU] | Freq: Three times a day (TID) | SUBCUTANEOUS | Status: DC
  Administered 2020-09-15 (×2): 8 [IU] via SUBCUTANEOUS

## 2020-09-15 MED ORDER — SODIUM CHLORIDE 0.9 % IV SOLN
2.0000 g | INTRAVENOUS | Status: DC
  Administered 2020-09-15 – 2020-09-16 (×2): 2 g via INTRAVENOUS
  Filled 2020-09-15 (×2): qty 20

## 2020-09-15 MED ORDER — ENSURE MAX PROTEIN PO LIQD
11.0000 [oz_av] | Freq: Every day | ORAL | Status: DC
  Administered 2020-09-16 (×2): 11 [oz_av] via ORAL
  Filled 2020-09-15 (×2): qty 330

## 2020-09-15 MED ORDER — ENOXAPARIN SODIUM 80 MG/0.8ML ~~LOC~~ SOLN
65.0000 mg | SUBCUTANEOUS | Status: DC
  Administered 2020-09-15 – 2020-09-20 (×6): 65 mg via SUBCUTANEOUS
  Filled 2020-09-15 (×7): qty 0.65

## 2020-09-15 MED ORDER — HYDROMORPHONE HCL 1 MG/ML IJ SOLN
0.5000 mg | INTRAMUSCULAR | Status: DC | PRN
  Administered 2020-09-15 – 2020-09-23 (×26): 0.5 mg via INTRAVENOUS
  Filled 2020-09-15 (×28): qty 0.5

## 2020-09-15 MED ORDER — INSULIN DETEMIR 100 UNIT/ML ~~LOC~~ SOLN
15.0000 [IU] | Freq: Two times a day (BID) | SUBCUTANEOUS | Status: DC
  Administered 2020-09-15: 15 [IU] via SUBCUTANEOUS
  Filled 2020-09-15 (×2): qty 0.15

## 2020-09-15 MED ORDER — SENNOSIDES-DOCUSATE SODIUM 8.6-50 MG PO TABS
1.0000 | ORAL_TABLET | Freq: Two times a day (BID) | ORAL | Status: DC | PRN
  Administered 2020-09-16 – 2020-09-17 (×3): 1 via ORAL
  Filled 2020-09-15 (×3): qty 1

## 2020-09-15 MED ORDER — INSULIN ASPART 100 UNIT/ML ~~LOC~~ SOLN
0.0000 [IU] | Freq: Three times a day (TID) | SUBCUTANEOUS | Status: DC
  Administered 2020-09-15: 3 [IU] via SUBCUTANEOUS
  Administered 2020-09-16 – 2020-09-18 (×7): 4 [IU] via SUBCUTANEOUS
  Administered 2020-09-18: 11 [IU] via SUBCUTANEOUS
  Administered 2020-09-18: 7 [IU] via SUBCUTANEOUS
  Administered 2020-09-19 (×2): 4 [IU] via SUBCUTANEOUS
  Administered 2020-09-19: 11 [IU] via SUBCUTANEOUS
  Administered 2020-09-20 (×2): 3 [IU] via SUBCUTANEOUS
  Administered 2020-09-21: 4 [IU] via SUBCUTANEOUS

## 2020-09-15 MED ORDER — POLYETHYLENE GLYCOL 3350 17 G PO PACK
17.0000 g | PACK | Freq: Two times a day (BID) | ORAL | Status: DC | PRN
  Administered 2020-09-16 – 2020-09-20 (×2): 17 g via ORAL
  Filled 2020-09-15 (×2): qty 1

## 2020-09-15 MED ORDER — INSULIN DETEMIR 100 UNIT/ML ~~LOC~~ SOLN
20.0000 [IU] | Freq: Two times a day (BID) | SUBCUTANEOUS | Status: DC
  Administered 2020-09-15 – 2020-09-16 (×2): 20 [IU] via SUBCUTANEOUS
  Filled 2020-09-15 (×4): qty 0.2

## 2020-09-15 NOTE — Progress Notes (Signed)
  Echocardiogram 2D Echocardiogram has been performed.  [name] M 09/15/2020, 1:27 PM

## 2020-09-15 NOTE — Progress Notes (Signed)
PROGRESS NOTE  Brittany Silva GYJ:[id number] DOB: 09-17-1956   PCP: Carol Ada, MD  Patient is from: Home  DOA: 09/11/2020 LOS: 3  Chief complaints: Back pain  Brief Narrative / Interim history: 64 year old female with history of chronic back pain, DM-2, HTN, hypothyroidism, morbid obesity and recent UTI presenting with 5 days of back pain, bilateral leg pain, nausea, vomiting, fever and chills.  In ED, MRI lumbar spine concerning for lumbar epidural abscess around the L5-S1 disc space and soft tissue enhancement on the right posterior to the L4-5 and L5-S1 facet joints.  Neurosurgery consulted, and recommended transfer to [facility]   The next day, evaluated by neurosurgery who recommended IR biopsy for tissue diagnosis and ID consult for antibiotic therapy. IR consulted. Patient underwent fluoroscopy guided needle aspiration.  Tissue culture sent.  Started on IV vancomycin, cefepime and Flagyl.  ID consulted.  Subjective: Seen and examined earlier this morning.  Continues to endorse significant back pain.  Pain improved with fentanyl but returns quickly when fentanyl wears off.  She currently rates her pain 6/10.  No new focal neuro deficit.  Denies bowel or bladder issue other than mild dysuria.  Also reports left ankle/foot pain.  Objective: Vitals:   09/14/20 1415 09/14/20 2122 09/15/20 0422 09/15/20 0800  BP: 134/72 (!) 146/80 140/83 (!) 141/75  Pulse: 85 73 70 80  Resp: 17 17 18    Temp: 98.2 F (36.8 C) 98.1 F (36.7 C) 98 F (36.7 C) 98.5 F (36.9 C)  TempSrc: Oral Oral Oral Oral  SpO2: 97% 97% 97% 96%  Weight:      Height:        Intake/Output Summary (Last 24 hours) at 09/15/2020 1330 Last data filed at 09/14/2020 2100 Gross per 24 hour  Intake 683.36 ml  Output 700 ml  Net -16.64 ml   Filed Weights   09/13/20 0739  Weight: 129.3 kg    Examination:  GENERAL: No apparent distress.  Nontoxic. HEENT: MMM.  Vision and hearing grossly intact.  NECK:  Supple.  No apparent JVD.  RESP: On room air.  No IWOB.  Fair aeration bilaterally. CVS:  RRR. Heart sounds normal.  ABD/GI/GU: BS+. Abd soft, NTND.  MSK/EXT:  Moves extremities. No apparent deformity. No edema.  SKIN: no apparent skin lesion or wound NEURO: AAOx4.  Motor 4/5 in BLE.  Light sensation grossly intact.  Patellar reflex symmetric. PSYCH: Calm. Normal affect.  Procedures:  10/14-fluoroscopy guided needle aspiration of facet joints  Microbiology summarized: COVID-19 PCR negative.  Assessment & Plan: Acute on chronic low back pain/radiculopathy L5-S1 epidural abscess-no signs of cauda equina L4-5 and L5-S1 facet joint infection.  Left foot/ankle pain-x-ray with severe posttraumatic tibiotalar osteoarthritis -Neurosurgery consulted and recommended IR biopsy and ID consult for antibiotics. -Underwent fluoroscopy guided needle aspiration of the facet joints. -Start broad-spectrum antibiotics-vancomycin, cefepime and Flagyl -Follow surgical tissue cultures-Gram stain with GPC in pairs -Infectious disease consulted. -Follow MRI of left foot -Pain control scheduled Tylenol and tramadol, and IV Dilaudid for breakthrough pain -Bowel regimen -PT/OT eval   Uncontrolled DM-2 with hypo- and hyperglycemia: On Levemir 75 units twice daily and SSI.  A1c 8.6%. Recent Labs  Lab 09/14/20 1136 09/14/20 1651 09/14/20 2027 09/15/20 0638 09/15/20 1225  GLUCAP 170* 309* 306* 255* 277*  -Increase SSI from moderate to high -Increase NovoLog from 8 to 12 units AC -Increase Levemir from 15 to 20 units twice daily -Continue atorvastatin  Essential hypertension: Normotensive. -Continue home amlodipine -Continue holding losartan.  Hypothyroidism -Continue home Synthroid  Hypokalemia-resolved.  Hyponatremia: Na 131 but corrects to 135 for hyperglycemia -Recheck.  Acute cystitis-treated with IV ceftriaxone x1 and Keflex x3 days -Should be covered with antibiotics as  above.  Morbid obesity Body mass index is 44.64 kg/m. Nutrition Problem: Increased nutrient needs Etiology: acute illness Signs/Symptoms: estimated needs Interventions: MVI, Prostat, Boost Breeze   DVT prophylaxis:  Place and maintain sequential compression device Start: 09/12/20 1806  Code Status: Full code Family Communication: Patient and/or RN. Available if any question.  Status is: Inpatient  Remains inpatient appropriate because:Ongoing diagnostic testing needed not appropriate for outpatient work up, IV treatments appropriate due to intensity of illness or inability to take PO and Inpatient level of care appropriate due to severity of illness   Dispo: The patient is from: Home              Anticipated d/c is to: Home              Anticipated d/c date is: > 3 days              Patient currently is not medically stable to d/c.       Consultants:  Neurosurgery-signed off. IR-off. Infectious disease   Sch Meds:  Scheduled Meds: . (feeding supplement) PROSource Plus  30 mL Oral BID BM  . acetaminophen  1,000 mg Oral Q8H  . amLODipine  10 mg Oral Daily  . atorvastatin  40 mg Oral Daily  . DULoxetine  90 mg Oral Daily  . enoxaparin (LOVENOX) injection  65 mg Subcutaneous Q24H  . feeding supplement  1 Container Oral TID BM  . insulin aspart  0-20 Units Subcutaneous TID WC  . insulin aspart  0-5 Units Subcutaneous QHS  . insulin aspart  12 Units Subcutaneous TID WC  . insulin detemir  20 Units Subcutaneous BID  . levothyroxine  75 mcg Oral Q0600  . multivitamin with minerals  1 tablet Oral Daily  . traMADol  50 mg Oral Q8H   Continuous Infusions: . cefTRIAXone (ROCEPHIN)  IV    . metronidazole 500 mg (09/15/20 0736)  . vancomycin 1,250 mg (09/15/20 0329)   PRN Meds:.hydrALAZINE, HYDROmorphone (DILAUDID) injection, ondansetron **OR** ondansetron (ZOFRAN) IV, polyethylene glycol, senna-docusate  Antimicrobials: Anti-infectives (From admission, onward)   Start      Dose/Rate Route Frequency Ordered Stop   09/15/20 1500  cefTRIAXone (ROCEPHIN) 2 g in sodium chloride 0.9 % 100 mL IVPB        2 g 200 mL/hr over 30 Minutes Intravenous Every 24 hours 09/15/20 1049     09/15/20 0400  vancomycin (VANCOREADY) IVPB 1250 mg/250 mL        1,250 mg 166.7 mL/hr over 90 Minutes Intravenous Every 12 hours 09/14/20 1416     09/14/20 1500  vancomycin (VANCOCIN) 2,500 mg in sodium chloride 0.9 % 500 mL IVPB        2,500 mg 250 mL/hr over 120 Minutes Intravenous  Once 09/14/20 1410 09/14/20 1901   09/14/20 1430  metroNIDAZOLE (FLAGYL) IVPB 500 mg        500 mg 100 mL/hr over 60 Minutes Intravenous Every 8 hours 09/14/20 1358     09/14/20 1430  ceFEPIme (MAXIPIME) 2 g in sodium chloride 0.9 % 100 mL IVPB  Status:  Discontinued        2 g 200 mL/hr over 30 Minutes Intravenous Every 8 hours 09/14/20 1410 09/15/20 1049       I have personally reviewed the  following labs and images: CBC: Recent Labs  Lab 09/08/20 2112 09/08/20 2112 09/11/20 2239 09/12/20 1811 09/13/20 1525 09/14/20 0106 09/15/20 0322  WBC 12.0*   < > 10.7* 11.7* 9.7 10.8* 11.1*  NEUTROABS 10.3*  --   --   --  7.7 8.6*  --   HGB 13.9   < > 12.7 13.5 12.3 12.3 11.4*  HCT 40.9   < > 38.4 40.5 36.5 36.9 35.0*  MCV 85.4   < > 87.3 88.2 85.9 86.2 86.6  PLT 165   < > 265 320 378 417* 413*   < > = values in this interval not displayed.   BMP &GFR Recent Labs  Lab 09/08/20 2112 09/11/20 2239 09/12/20 1811 09/13/20 1525 09/14/20 0106 09/15/20 0322  NA 130* 134*  --  131*  --  131*  K 4.1 3.1*  --  4.1  --  3.9  CL 91* 97*  --  95*  --  97*  CO2 25 28  --  24  --  23  GLUCOSE 208* 76  --  219*  --  279*  BUN 27* 17  --  8  --  15  CREATININE 1.08* 0.89 0.68 0.72  --  0.77  CALCIUM 8.2* 8.1*  --  8.1*  --  8.0*  MG  --   --  2.4 2.1 2.1 2.3  PHOS  --   --   --  2.1*  --  3.3   Estimated Creatinine Clearance: 99.5 mL/min (by C-G formula based on SCr of 0.77 mg/dL). Liver &  Pancreas: Recent Labs  Lab 09/08/20 2112 09/11/20 2359 09/13/20 1525 09/15/20 0322  AST 34 37  --   --   ALT 20 22  --   --   ALKPHOS 110 128*  --   --   BILITOT 1.5* 0.6  --   --   PROT 7.2 6.9  --   --   ALBUMIN 3.3* 2.8* 2.4* 2.3*   Recent Labs  Lab 09/08/20 2112  LIPASE 24   No results for input(s): AMMONIA in the last 168 hours. Diabetic: Recent Labs    09/12/20 1812  HGBA1C 8.6*   Recent Labs  Lab 09/14/20 1136 09/14/20 1651 09/14/20 2027 09/15/20 0638 09/15/20 1225  GLUCAP 170* 309* 306* 255* 277*   Cardiac Enzymes: No results for input(s): CKTOTAL, CKMB, CKMBINDEX, TROPONINI in the last 168 hours. No results for input(s): PROBNP in the last 8760 hours. Coagulation Profile: Recent Labs  Lab 09/14/20 0106  INR 1.1   Thyroid Function Tests: No results for input(s): TSH, T4TOTAL, FREET4, T3FREE, THYROIDAB in the last 72 hours. Lipid Profile: No results for input(s): CHOL, HDL, LDLCALC, TRIG, CHOLHDL, LDLDIRECT in the last 72 hours. Anemia Panel: No results for input(s): VITAMINB12, FOLATE, FERRITIN, TIBC, IRON, RETICCTPCT in the last 72 hours. Urine analysis:    Component Value Date/Time   COLORURINE YELLOW 09/12/2020 0528   APPEARANCEUR CLEAR 09/12/2020 0528   LABSPEC 1.022 09/12/2020 0528   PHURINE 5.0 09/12/2020 0528   GLUCOSEU >=500 (A) 09/12/2020 0528   HGBUR NEGATIVE 09/12/2020 0528   HGBUR trace-lysed 05/16/2008 0849   BILIRUBINUR NEGATIVE 09/12/2020 0528   KETONESUR NEGATIVE 09/12/2020 0528   PROTEINUR NEGATIVE 09/12/2020 0528   UROBILINOGEN 0.2 05/16/2008 0849   NITRITE NEGATIVE 09/12/2020 0528   LEUKOCYTESUR NEGATIVE 09/12/2020 0528   Sepsis Labs: Invalid input(s): PROCALCITONIN, [place]  Microbiology: Recent Results (from the past 240 hour(s))  Urine culture  Status: Abnormal   Collection Time: 09/08/20 10:30 PM   Specimen: Urine, Random  Result Value Ref Range Status   Specimen Description   Final    URINE,  RANDOM Performed at [facility], [address] [place], [place] [address]    Special Requests   Final    NONE Performed at [facility], [address] [place], [place] [address]    Culture 80,000 COLONIES/mL ESCHERICHIA COLI (A)  Final   Report Status 09/11/2020 FINAL  Final   Organism ID, Bacteria ESCHERICHIA COLI (A)  Final      Susceptibility   Escherichia coli - MIC*    AMPICILLIN >=32 RESISTANT Resistant     CEFAZOLIN <=4 SENSITIVE Sensitive     CEFTRIAXONE <=0.25 SENSITIVE Sensitive     CIPROFLOXACIN <=0.25 SENSITIVE Sensitive     GENTAMICIN <=1 SENSITIVE Sensitive     IMIPENEM <=0.25 SENSITIVE Sensitive     NITROFURANTOIN <=16 SENSITIVE Sensitive     TRIMETH/SULFA <=20 SENSITIVE Sensitive     AMPICILLIN/SULBACTAM 8 SENSITIVE Sensitive     PIP/TAZO <=4 SENSITIVE Sensitive     * 80,000 COLONIES/mL ESCHERICHIA COLI  Respiratory Panel by RT PCR (Flu A&B, Covid) - Nasopharyngeal Swab     Status: None   Collection Time: 09/12/20  1:43 PM   Specimen: Nasopharyngeal Swab  Result Value Ref Range Status   SARS Coronavirus 2 by RT PCR NEGATIVE NEGATIVE Final    Comment: (NOTE) SARS-CoV-2 target nucleic acids are NOT DETECTED.  The SARS-CoV-2 RNA is generally detectable in upper respiratoy specimens during the acute phase of infection. The lowest concentration of SARS-CoV-2 viral copies this assay can detect is 131 copies/mL. A negative result does not preclude SARS-Cov-2 infection and should not be used as the sole basis for treatment or other patient management decisions. A negative result may occur with  improper specimen collection/handling, submission of specimen other than nasopharyngeal swab, presence of viral mutation(s) within the areas targeted by this assay, and inadequate number of viral copies (<131 copies/mL). A negative result must be combined with clinical observations, patient history, and epidemiological information. The expected  result is Negative.  Fact Sheet for Patients:  PinkCheek.be  Fact Sheet for Healthcare Providers:  GravelBags.it  This test is no t yet approved or cleared by the [place] FDA and  has been authorized for detection and/or diagnosis of SARS-CoV-2 by FDA under an Emergency Use Authorization (EUA). This EUA will remain  in effect (meaning this test can be used) for the duration of the COVID-19 declaration under Section 564(b)(1) of the Act, 21 U.S.C. section 360bbb-3(b)(1), unless the authorization is terminated or revoked sooner.     Influenza A by PCR NEGATIVE NEGATIVE Final   Influenza B by PCR NEGATIVE NEGATIVE Final    Comment: (NOTE) The Xpert Xpress SARS-CoV-2/FLU/RSV assay is intended as an aid in  the diagnosis of influenza from Nasopharyngeal swab specimens and  should not be used as a sole basis for treatment. Nasal washings and  aspirates are unacceptable for Xpert Xpress SARS-CoV-2/FLU/RSV  testing.  Fact Sheet for Patients: PinkCheek.be  Fact Sheet for Healthcare Providers: GravelBags.it  This test is not yet approved or cleared by the [place] FDA and  has been authorized for detection and/or diagnosis of SARS-CoV-2 by  FDA under an Emergency Use Authorization (EUA). This EUA will remain  in effect (meaning this test can be used) for the duration of the  Covid-19 declaration under Section  564(b)(1) of the Act, 21  U.S.C. section 360bbb-3(b)(1), unless the authorization is  terminated or revoked. Performed at [facility], [place] [address], [place], [place] [address]   Aerobic/Anaerobic Culture (surgical/deep wound)     Status: None (Preliminary result)   Collection Time: 09/14/20  1:52 PM   Specimen: Abscess; Synovial Fluid  Result Value Ref Range Status   Specimen Description ABSCESS  Final   Special Requests RIGHT L4  L5 JOINT  Final   Gram Stain   Final    ABUNDANT WBC PRESENT, PREDOMINANTLY PMN MODERATE GRAM POSITIVE COCCI IN PAIRS IN CHAINS    Culture   Final    CULTURE REINCUBATED FOR BETTER GROWTH Performed at [facility], [address] [place], [place] [address]    Report Status PENDING  Incomplete    Radiology Studies: DG Ankle 2 Views Left  Result Date: 09/15/2020 CLINICAL DATA:  Chronic left ankle pain. EXAM: LEFT ANKLE - 2 VIEW COMPARISON:  Left foot x-rays dated Apr 05, 2014. FINDINGS: No acute fracture or dislocation. Chronic fracture of the medial malleolar pin. Abandoned screw in the lateral malleolus. Severe tibiotalar osteoarthritis with joint space narrowing and marked bony hypertrophy. Mild dorsal degenerative spurring of the second TMT joint. Bone mineralization is normal. Soft tissues are unremarkable. IMPRESSION: 1. Severe post-traumatic tibiotalar osteoarthritis. Electronically Signed   By: Titus Dubin M.D.   On: 09/15/2020 12:45     Kyre Jeffries T. [place]  If 7PM-7AM, please contact night-coverage [email or web address] 09/15/2020, 1:30 PM

## 2020-09-15 NOTE — Evaluation (Signed)
Physical Therapy Evaluation Patient Details Name: Brittany Silva MRN: [id number] DOB: 09-Nov-1956 Today's Date: 09/15/2020   History of Present Illness  Brittany Silva is a 64 y.o. female with medical history significant of DM2, HTN, hypothyroidism. Presenting with 5 days of back/leg pain, N/V. Presented to ED, diagnosed with UTI and sent home with antibiotics. Symptoms did not improve, so pt returned back to ED. MRI showed possible epidural abscess. Pt admitted and underwent L4-L5 facet aspiration  Clinical Impression  Prior to admission, pt lives with her spouse, son and daughter in law and is independent with ADL's/mobility. Pt presents with decreased functional mobility secondary to back pain, weakness, balance deficits and decreased endurance. Pt ambulating 20 feet with a walker at a min guard assist level. Education provided regarding spinal precautions for comfort and improved body mechanics. Suspect pt will progress well based on PLOF and motivation. Anticipate will need HHPT upon discharge. Will continue to progress as tolerated.     Follow Up Recommendations Home health PT;Supervision/Assistance - 24 hour    Equipment Recommendations  3in1 (PT)    Recommendations for Other Services       Precautions / Restrictions Precautions Precautions: Fall Precaution Comments: back pain Restrictions Weight Bearing Restrictions: No      Mobility  Bed Mobility Overal bed mobility: Needs Assistance Bed Mobility: Rolling;Sidelying to Sit Rolling: Modified independent (Device/Increase time) Sidelying to sit: Min assist       General bed mobility comments: cues for log roll technique, minA to bring trunk to upright sitting position  Transfers Overall transfer level: Needs assistance Equipment used: Rolling walker (2 wheeled) Transfers: Sit to/from [facility] Sit to Stand: Min guard Stand pivot transfers: Min guard       General transfer comment: min guard  for transfers with RW, increased time/effort  Ambulation/Gait Ambulation/Gait assistance: Min guard Gait Distance (Feet): 20 Feet Assistive device: Rolling walker (2 wheeled) Gait Pattern/deviations: Step-through pattern;Decreased stride length Gait velocity: decreased Gait velocity interpretation: <1.8 ft/sec, indicate of risk for recurrent falls General Gait Details: Min guard for safety, no overt LOB or knee buckle, however pt reporting "feeling weak," after using the bathroom so recliner chair brought to entrance to assist pt into sitting position  Stairs            Wheelchair Mobility    Modified Rankin (Stroke Patients Only)       Balance Overall balance assessment: Needs assistance Sitting-balance support: No upper extremity supported;Feet supported Sitting balance-Leahy Scale: Good     Standing balance support: Bilateral upper extremity supported;During functional activity Standing balance-Leahy Scale: Poor Standing balance comment: reliant on UE support                             Pertinent Vitals/Pain Pain Assessment: 0-10 Pain Score: 6  Pain Location: low back Pain Descriptors / Indicators: Grimacing;Discomfort;Constant Pain Intervention(s): Limited activity within patient's tolerance;Monitored during session    [place] expects to be discharged to:: Private residence Living Arrangements: Spouse/significant other;Children (son, daughter in Sports [employer]) Available Help at Discharge: Family Type of Home: House Home Access: Stairs to enter   Technical brewer of Steps: 1 Home Layout: Able to live on main level with bedroom/bathroom Home Equipment: Grab bars - tub/shower;Walker - 2 wheels;Cane - single point      Prior Function Level of Independence: Needs assistance   Gait / Transfers Assistance Needed: Independent, denies falls  ADL's / Homemaking Assistance Needed:  independent with ADL's, needing assist with IADL's i.e.  cooking the past couple months        Hand Dominance   Dominant Hand: Right    Extremity/Trunk Assessment   Upper Extremity Assessment Upper Extremity Assessment: Defer to OT evaluation    Lower Extremity Assessment Lower Extremity Assessment: RLE deficits/detail;LLE deficits/detail RLE Deficits / Details: Grossly 4/5 RLE Sensation: history of peripheral neuropathy LLE Deficits / Details: Grossly 4/5 LLE Sensation: history of peripheral neuropathy    Cervical / Trunk Assessment Cervical / Trunk Assessment: Normal  Communication   Communication: No difficulties  Cognition Arousal/Alertness: Awake/alert Behavior During Therapy: WFL for tasks assessed/performed Overall Cognitive Status: Within Functional Limits for tasks assessed                                        General Comments General comments (skin integrity, edema, etc.): Pt reported feeling hot after mobility, but denied lightheadness. Pt inquiring about possible rehab prior to going home if back pain continues as it is    Exercises     Assessment/Plan    PT Assessment Patient needs continued PT services  PT Problem List Decreased strength;Decreased activity tolerance;Decreased balance;Decreased mobility;Pain       PT Treatment Interventions DME instruction;Gait training;Stair training;Functional mobility training;Therapeutic activities;Therapeutic exercise;Balance training;Patient/family education    PT Goals (Current goals can be found in the Care Plan section)  Acute Rehab PT Goals Patient Stated Goal: pain control, be able to take care of myself again PT Goal Formulation: With patient Time For Goal Achievement: 09/29/20 Potential to Achieve Goals: Good    Frequency Min 3X/week   Barriers to discharge        Co-evaluation               AM-PAC PT "6 Clicks" Mobility  Outcome Measure Help needed turning from your back to your side while in a flat bed without using  bedrails?: None Help needed moving from lying on your back to sitting on the side of a flat bed without using bedrails?: A Little Help needed moving to and from a bed to a chair (including a wheelchair)?: A Little Help needed standing up from a chair using your arms (e.g., wheelchair or bedside chair)?: A Little Help needed to walk in hospital room?: A Little Help needed climbing 3-5 steps with a railing? : A Lot 6 Click Score: 18    End of Session Equipment Utilized During Treatment: Gait belt Activity Tolerance: Patient tolerated treatment well Patient left: in chair;with call bell/phone within reach;with chair alarm set Nurse Communication: Mobility status PT Visit Diagnosis: Unsteadiness on feet (R26.81);Muscle weakness (generalized) (M62.81);Difficulty in walking, not elsewhere classified (R26.2);Pain Pain - part of body:  (back)    Time: 3825-0539 PT Time Calculation (min) (ACUTE ONLY): 36 min   Charges:   PT Evaluation $PT Eval Moderate Complexity: 1 Mod PT Treatments $Therapeutic Activity: 8-22 mins        Wyona Almas, PT, DPT Acute Rehabilitation Services Pager [phone] Office [phone]   Deno Etienne 09/15/2020, 1:38 PM

## 2020-09-15 NOTE — Progress Notes (Signed)
Nutrition Follow-up  DOCUMENTATION CODES:   Morbid obesity  INTERVENTION:   -D/c Prosource Plus -D/c Boost Breeze -Continue MVI with minerals daily -Ensure Max po daily, each supplement provides 150 kcal and 30 grams of protein  NUTRITION DIAGNOSIS:   Increased nutrient needs related to acute illness as evidenced by estimated needs.  Ongoing  GOAL:   Patient will meet greater than or equal to 90% of their needs  Progressing   MONITOR:   PO intake, Supplement acceptance, Diet advancement, Labs, Weight trends, Skin, I & O's  REASON FOR ASSESSMENT:   Malnutrition Screening Tool    ASSESSMENT:   Brittany Silva is a 64 y.o. female with medical history significant of DM2, HTN, hypothyroidism. Presenting with 5 days of back/leg pain, N/V. Pain started in the right buttock on Thursday while resting. It was sharp and radiated down to the right knee. She tried APAP and heating pads but didn't work. She also had N/V during this time. These symptoms continued through Friday. She went to the ED and was told she had a UTI. She was sent home with antibiotics. The was only able to take her abx for a day or two and it did not improve her situation. Her pain/N/V worsened and she had decreased urination. Out of concern for worsening condition, she returned to the ED last night.  10/14- s/p rt L4-L5 facet aspiration, advanced to heart healthy, carb modified diet  Reviewed I/O's: -17 ml x 24 hours and +583 ml since admission  UOP: 700 ml x 24 hours  Attempted to speak with pt via phone call to hospital room, however, unable to reach.   Pt with good appetite. Noted meal completion 100%. She is refusing Prosource Plus and Boost Breeze supplements.   Labs reviewed: CBGS: 277 (inpatient orders for glycemic control are 0-20 units insulin aspart TID with meals, 0-5 units insulin aspart daily at bedtime, 12 units insulin aspart TID with meal,s and 20 units insulin detemir BID).   Diet Order:    Diet Order            Diet heart healthy/carb modified Room service appropriate? Yes; Fluid consistency: Thin  Diet effective now                 EDUCATION NEEDS:   No education needs have been identified at this time  Skin:  Skin Assessment: Reviewed RN Assessment  Last BM:  09/11/20  Height:   Ht Readings from Last 1 Encounters:  09/13/20 5\' 7"  (1.702 m)    Weight:   Wt Readings from Last 1 Encounters:  09/13/20 129.3 kg    Ideal Body Weight:  61.4 kg  BMI:  Body mass index is 44.64 kg/m.  Estimated Nutritional Needs:   Kcal:  1850-2050  Protein:  110-125 grams  Fluid:  > 1.8 L    Loistine Chance, RD, LDN, [place] Registered Dietitian II Certified Diabetes Care and Education Specialist Please refer to [facility] for RD and/or RD on-call/weekend/after hours pager

## 2020-09-15 NOTE — Consult Note (Signed)
[facility]    Date of Admission:  09/11/2020     Total days of antibiotics 2  Vancomycin Cefepime Flagyl                Reason for Consult: Vertebral infection     Referring Provider: Carol Ada, MD  Primary Care Provider: Carol Ada, MD    Assessment: Brittany Silva is a 64 y.o. female with acute vertebral discitis and epidural abscess. Gram stain from IR aspirate reveals likely strep species with gram positive cocci in chains/pairs. Will continue ceftriaxone and vancomycin for now until further ID. Her left toe looks a little concerning for deeper infection and possibly the source of her back infection. Will hold off on PICC line until Sunday/Monday with concern she may have been bacteremic at onset of illness. Will also check Echo to be thorough given concern over bacteremia.    Will plan to get MRI of the left foot and Xray of ankle to work up DFU as it is concerning in appearance for osteomyelitis and assess ankle hardware (if still present, she was unclear of this).   Dr. Juleen China will follow up with her over the weekend and adjust antibiotics based on micro.   D/W Dr. Cyndia Skeeters regarding her uncontrolled pain - he will add dilauded for some break through severe pain for now.     Plan: 1. Continue Vancomycin and ceftriaxone, stop metronidazole  2. Hold on PICC until Sunday/Monday 3. TTE 4. MRI of the left foot 5. XRay of left ankle      Active Problems:   Type 2 diabetes mellitus with hyperglycemia (HCC)   Abscess in epidural space of lumbar spine   Diabetic foot ulcer ([place])   . (feeding supplement) PROSource Plus  30 mL Oral BID BM  . acetaminophen  1,000 mg Oral Q8H  . amLODipine  10 mg Oral Daily  . atorvastatin  40 mg Oral Daily  . DULoxetine  90 mg Oral Daily  . enoxaparin (LOVENOX) injection  65 mg Subcutaneous Q24H  . feeding supplement  1 Container Oral TID BM  . insulin aspart  0-20 Units Subcutaneous TID WC    . insulin aspart  0-5 Units Subcutaneous QHS  . insulin aspart  12 Units Subcutaneous TID WC  . insulin detemir  20 Units Subcutaneous BID  . levothyroxine  75 mcg Oral Q0600  . multivitamin with minerals  1 tablet Oral Daily  . traMADol  50 mg Oral Q8H    HPI: Brittany Silva is a 64 y.o. female admitted to the hospital on October 11th for acutely worsening low back pain.   She states her pain started pretty suddenly and quickly worsening on Thursday of last week. Went to [facility] for evaluation and thought to be symptoms of UTI with E coli growing from urine (80K cfu), chills and back pain. She did have a resolution of fever symptoms with the antibiotic but the back pain did not improve. She went on to have progressively more pain and weakness of the right leg with shooting pains that brought her to the ER again - MRI revealed posterior and ventral fluid collections around the L5-S1 disc space compatible with epidural abscess. Soft tissue enhancement on the right posterior to the L4-5 and L5-S1 facet joints that appeared infected. She underwent disc aspiration prior to antibiotics and is showing GPCs in pairs/chains on stain. She has had some very  slight improvements in the shooting pains but overall very uncomfortable and having lots of hot flashes and sweating.   She had recent dental cleaning and was getting set up for braces recently but all was well with her exam - no oral/dental pain.  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 She has an active one on the left #4 toe at the present time. Was on cephalexin sometime for this over the last few months. She has not noticed any drainage.   H/O MRSA finger infection in the past and frequent strep throat infections.  She has recently had a change in her insulin and has been very hyperglycemic recently due to this (600s at times).    Review of Systems: Review of Systems  Constitutional: Positive for  chills and fever.  Respiratory: Negative for cough and shortness of breath.   Cardiovascular: Negative for chest pain, orthopnea and leg swelling.  Gastrointestinal: Negative for abdominal pain, diarrhea and vomiting.  Genitourinary: Negative for dysuria.  Musculoskeletal: Positive for back pain.  Skin: Negative for rash.  Neurological: Negative for focal weakness and weakness.    Past Medical History:  Diagnosis Date  . Ankle fracture    left  . Anxiety    chronic  . Cervical back pain with evidence of disc disease   . Diabetes mellitus   . Dyslipidemia   . Edema    lower extremity  . GERD (gastroesophageal reflux disease)   . Hyperlipidemia   . Hypertension   . Hypothyroid   . Obstructive apnea    Split study 05-22-12 - patient was too claustrophobic and did not persue CPAP.   Marland Kitchen Osteoarthritis   . PVC's (premature ventricular contractions)   . Retinopathy    diabetic, mild, nonproliferative bilateral  . TSH (thyroid-stimulating hormone deficiency)    evaluation off RX in 4/14  . Vitamin D deficiency     Social History   Tobacco Use  . Smoking status: Never Smoker  . Smokeless tobacco: Never Used  Vaping Use  . Vaping Use: Never used  Substance Use Topics  . Alcohol use: No  . Drug use: No    Family History  Problem Relation Age of Onset  . Coronary artery disease Father        MI at age 2  . Hypertension Mother   . Thyroid disease Mother   . Ulcers Sister   . Allergies Sister   . Psoriasis Sister   . Diabetes Maternal Grandfather   . Heart attack Paternal Grandfather   . Colon cancer Neg Hx    Allergies  Allergen Reactions  . Hydrochlorothiazide     rash  . Lisinopril     Other reaction(s): Cough  . Adhesive [Tape]   . Codeine Phosphate     REACTION: throat swelling  . Darvon Other (See Comments)    Jittering, skin problems  . Darvon [Propoxyphene Hcl]     Hyperactivity; "makes me crazy"  . Hydrocodone-Acetaminophen Itching    *Has  tolerated hydromorphone as an adult* Per pt, when she was a child had throat itching, difficulty breathing and feel strange and also vomit   . Propoxyphene Hcl     REACTION: Makes her crazy  . Latex Rash  . Neosporin [Neomycin-Bacitracin Zn-Polymyx] Rash  . Sulfamethoxazole Itching and Rash    OBJECTIVE: Blood pressure 134/80, pulse 90, temperature 97.6 F (36.4 C), temperature source Oral, resp. rate 17, height 5\' 7"  (1.702 m), weight 129.3 kg, SpO2 96 %.  Physical Exam Vitals reviewed.  Constitutional:      Appearance: She is not ill-appearing.  HENT:     Mouth/Throat:     Mouth: Mucous membranes are moist.     Pharynx: Oropharynx is clear. No oropharyngeal exudate.  Eyes:     General: No scleral icterus. Cardiovascular:     Rate and Rhythm: Normal rate and regular rhythm.     Heart sounds: No murmur heard.   Pulmonary:     Effort: Pulmonary effort is normal.     Breath sounds: Normal breath sounds.  Abdominal:     General: Bowel sounds are normal. There is no distension.     Palpations: Abdomen is soft.  Musculoskeletal:        General: Tenderness (lower back ) present.     Comments: Left #4 toe with ulceration and redness/swelling. She also has some hyperpigmented skin changes along the lower ankle that are cool to the touch. She does have some hardware in place here from previous fracture.   Skin:    General: Skin is warm and dry.     Capillary Refill: Capillary refill takes less than 2 seconds.  Neurological:     Mental Status: She is alert and oriented to person, place, and time.     Lab Results Lab Results  Component Value Date   WBC 11.1 (H) 09/15/2020   HGB 11.4 (L) 09/15/2020   HCT 35.0 (L) 09/15/2020   MCV 86.6 09/15/2020   PLT 413 (H) 09/15/2020    Lab Results  Component Value Date   CREATININE 0.77 09/15/2020   BUN 15 09/15/2020   NA 131 (L) 09/15/2020   K 3.9 09/15/2020   CL 97 (L) 09/15/2020   CO2 23 09/15/2020    Lab Results  Component  Value Date   ALT 22 09/11/2020   AST 37 09/11/2020   ALKPHOS 128 (H) 09/11/2020   BILITOT 0.6 09/11/2020     Microbiology: Recent Results (from the past 240 hour(s))  Urine culture     Status: Abnormal   Collection Time: 09/08/20 10:30 PM   Specimen: Urine, Random  Result Value Ref Range Status   Specimen Description   Final    URINE, RANDOM Performed at [facility], [address] [place], [place] [address]    Special Requests   Final    NONE Performed at [facility], [address] [place], [place] [address]    Culture 80,000 COLONIES/mL ESCHERICHIA COLI (A)  Final   Report Status 09/11/2020 FINAL  Final   Organism ID, Bacteria ESCHERICHIA COLI (A)  Final      Susceptibility   Escherichia coli - MIC*    AMPICILLIN >=32 RESISTANT Resistant     CEFAZOLIN <=4 SENSITIVE Sensitive     CEFTRIAXONE <=0.25 SENSITIVE Sensitive     CIPROFLOXACIN <=0.25 SENSITIVE Sensitive     GENTAMICIN <=1 SENSITIVE Sensitive     IMIPENEM <=0.25 SENSITIVE Sensitive     NITROFURANTOIN <=16 SENSITIVE Sensitive     TRIMETH/SULFA <=20 SENSITIVE Sensitive     AMPICILLIN/SULBACTAM 8 SENSITIVE Sensitive     PIP/TAZO <=4 SENSITIVE Sensitive     * 80,000 COLONIES/mL ESCHERICHIA COLI  Respiratory Panel by RT PCR (Flu A&B, Covid) - Nasopharyngeal Swab     Status: None   Collection Time: 09/12/20  1:43 PM   Specimen: Nasopharyngeal Swab  Result Value Ref Range Status   SARS Coronavirus 2 by RT PCR NEGATIVE NEGATIVE Final  Comment: (NOTE) SARS-CoV-2 target nucleic acids are NOT DETECTED.  The SARS-CoV-2 RNA is generally detectable in upper respiratoy specimens during the acute phase of infection. The lowest concentration of SARS-CoV-2 viral copies this assay can detect is 131 copies/mL. A negative result does not preclude SARS-Cov-2 infection and should not be used as the sole basis for treatment or other patient management decisions. A negative result may occur with    improper specimen collection/handling, submission of specimen other than nasopharyngeal swab, presence of viral mutation(s) within the areas targeted by this assay, and inadequate number of viral copies (<131 copies/mL). A negative result must be combined with clinical observations, patient history, and epidemiological information. The expected result is Negative.  Fact Sheet for Patients:  PinkCheek.be  Fact Sheet for Healthcare Providers:  GravelBags.it  This test is no t yet approved or cleared by the [place] FDA and  has been authorized for detection and/or diagnosis of SARS-CoV-2 by FDA under an Emergency Use Authorization (EUA). This EUA will remain  in effect (meaning this test can be used) for the duration of the COVID-19 declaration under Section 564(b)(1) of the Act, 21 U.S.C. section 360bbb-3(b)(1), unless the authorization is terminated or revoked sooner.     Influenza A by PCR NEGATIVE NEGATIVE Final   Influenza B by PCR NEGATIVE NEGATIVE Final    Comment: (NOTE) The Xpert Xpress SARS-CoV-2/FLU/RSV assay is intended as an aid in  the diagnosis of influenza from Nasopharyngeal swab specimens and  should not be used as a sole basis for treatment. Nasal washings and  aspirates are unacceptable for Xpert Xpress SARS-CoV-2/FLU/RSV  testing.  Fact Sheet for Patients: PinkCheek.be  Fact Sheet for Healthcare Providers: GravelBags.it  This test is not yet approved or cleared by the [place] FDA and  has been authorized for detection and/or diagnosis of SARS-CoV-2 by  FDA under an Emergency Use Authorization (EUA). This EUA will remain  in effect (meaning this test can be used) for the duration of the  Covid-19 declaration under Section 564(b)(1) of the Act, 21  U.S.C. section 360bbb-3(b)(1), unless the authorization is  terminated or  revoked. Performed at [facility], [place] [address] [place], [place] [address]   Aerobic/Anaerobic Culture (surgical/deep wound)     Status: None (Preliminary result)   Collection Time: 09/14/20  1:52 PM   Specimen: Abscess; Synovial Fluid  Result Value Ref Range Status   Specimen Description ABSCESS  Final   Special Requests RIGHT L4 L5 JOINT  Final   Gram Stain   Final    ABUNDANT WBC PRESENT, PREDOMINANTLY PMN MODERATE GRAM POSITIVE COCCI IN PAIRS IN CHAINS    Culture   Final    CULTURE REINCUBATED FOR BETTER GROWTH Performed at [facility], [address], [place], [place] [address]    Report Status PENDING  Incomplete    Janene Madeira, MSN, NP-C [facility] [email or web address] Pager: [phone] Office: [phone] [place]: [phone]

## 2020-09-15 NOTE — Evaluation (Addendum)
 Occupational Therapy Evaluation Patient Details Name: Brittany Silva MRN: [id number] DOB: [birth year] Today's Date: 09/15/2020    History of Present Illness Brittany Silva is a 64 y.o. female with medical history significant of DM2, HTN, hypothyroidism. Presenting with 5 days of back/leg pain, N/V. Presented to ED, diagnosed with UTI and sent home with antibiotics. Symptoms did not improve, so pt returned back to ED. MRI showed possible epidural abscess. Pt admitted and underwent L4-L5 facet aspiration   Clinical Impression   PTA, pt lives with family and reports typically Independent with all ADLs and mobility without AD. Pt reports increasing difficulty with IADLs, but family assists as needed. Pt presents now with deficits in strength, balance, endurance and pain. Educated pt on compensatory strategies for LB ADLs to alleviate back back with continued education needed. Pt with urinary urgency during session, able to demo [facility] transfers with RW at min guard. Pt requires Min A for UB ADLs and Max A for LB ADLs due to deficits. Pt's functional abilities may improve with continued infection clearance with decreased back pain, so recommend HHOT at this time, as family may be able to provide light assist for ADLs. If pt unable to progress secondary to uncontrolled pain, pt may need short term rehab at [facility]. Will continue to monitor and update recommendations as appropriate.     Follow Up Recommendations  Home health OT;Supervision/Assistance - 24 hour (vs SNF pending progression)    Equipment Recommendations  3 in 1 bedside commode;Tub/shower bench;Other (comment) (RW)    Recommendations for Other Services       Precautions / Restrictions Precautions Precautions: Fall Precaution Comments: back pain Restrictions Weight Bearing Restrictions: No      Mobility Bed Mobility               General bed mobility comments: received walking from toilet with PT  Transfers Overall transfer  level: Needs assistance Equipment used: Rolling walker (2 wheeled) Transfers: Sit to/from [facility] Sit to Stand: Min guard Stand pivot transfers: Min guard       General transfer comment: min guard for transfers with RW, increased time/effort    Balance Overall balance assessment: Needs assistance Sitting-balance support: No upper extremity supported;Feet supported Sitting balance-Leahy Scale: Good     Standing balance support: Bilateral upper extremity supported;During functional activity Standing balance-Leahy Scale: Poor Standing balance comment: reliant on UE support                           ADL either performed or assessed with clinical judgement   ADL Overall ADL's : Needs assistance/impaired Eating/Feeding: Independent;Sitting   Grooming: Set up;Sitting;Wash/dry face;Brushing hair Grooming Details (indicate cue type and reason): Setup for grooming tasks seated in recliner, supervision with static standing at sink for hand hygiene  Upper Body Bathing: Minimal assistance;Sitting   Lower Body Bathing: Maximal assistance;Sit to/from stand   Upper Body Dressing : Minimal assistance;Sitting   Lower Body Dressing: Maximal assistance;Sit to/from stand Lower Body Dressing Details (indicate cue type and reason): Max A to don B socks. Educated on use of conservative back precautions but pt unable to complete figure four position due to back pain. Toilet Transfer: Min Press photographer Details (indicate cue type and reason): Min guard for stand pivot to/from [facility] with RW Toileting- Clothing Manipulation and Hygiene: Maximal assistance;Sit to/from stand Toileting - Clothing Manipulation Details (indicate cue type and reason): Max A for peri care due to increasing weakness,  fatigue and back pain     Functional mobility during ADLs: Min guard;Rolling walker General ADL Comments: Pt greatly limited by low back pain, minimal relief  with efforts of repositioning and techniques      Vision Baseline Vision/History: No visual deficits Patient Visual Report: No change from baseline Vision Assessment?: No apparent visual deficits     Perception     Praxis      Pertinent Vitals/Pain Pain Assessment: 0-10 Pain Score: 6  Pain Location: low back Pain Descriptors / Indicators: Grimacing;Discomfort;Constant Pain Intervention(s): Limited activity within patient's tolerance;Monitored during session;Repositioned     Hand Dominance Right   Extremity/Trunk Assessment Upper Extremity Assessment Upper Extremity Assessment: Generalized weakness   Lower Extremity Assessment Lower Extremity Assessment: Defer to PT evaluation   Cervical / Trunk Assessment Cervical / Trunk Assessment: Normal   Communication Communication Communication: No difficulties   Cognition Arousal/Alertness: Awake/alert Behavior During Therapy: WFL for tasks assessed/performed Overall Cognitive Status: Within Functional Limits for tasks assessed                                     General Comments  Pt reported feeling hot after mobility, but denied lightheadness. Pt inquiring about possible rehab prior to going home if back pain continues as it is    Exercises     Shoulder Instructions      Home Living Family/patient expects to be discharged to:: Private residence Living Arrangements: Spouse/significant other;Children (son, daughter in Sports coach) Available Help at Discharge: Family Type of Home: House Home Access: Stairs to enter Technical  of Steps: 1   Home Layout: Able to live on main level with bedroom/bathroom     Bathroom Shower/Tub: Teacher, early years/pre: Handicapped height Bathroom Accessibility: No   Home Equipment: Grab bars - tub/shower;Walker - 2 wheels;Cane - single point          Prior Functioning/Environment Level of Independence: Needs assistance  Gait / Transfers Assistance  Needed: Independent, denies falls ADL's / Homemaking Assistance Needed: independent with ADL's, needing assist with IADL's i.e. cooking the past couple months            OT Problem List: Decreased strength;Decreased activity tolerance;Impaired balance (sitting and/or standing);Decreased knowledge of use of DME or AE;Pain      OT Treatment/Interventions: Self-care/ADL training;Therapeutic exercise;Energy conservation;DME and/or AE instruction;Therapeutic activities;Patient/family education    OT Goals(Current goals can be found in the care plan section) Acute Rehab OT Goals Patient Stated Goal: pain control, be able to take care of myself again OT Goal Formulation: With patient Time For Goal Achievement: 09/29/20 Potential to Achieve Goals: Good ADL Goals Pt Will Perform Grooming: with modified independence;standing Pt Will Perform Lower Body Bathing: with min guard assist;sit to/from stand;with adaptive equipment;sitting/lateral leans Pt Will Perform Lower Body Dressing: with min guard assist;with adaptive equipment;sit to/from stand;sitting/lateral leans Pt Will Transfer to Toilet: with modified independence;bedside commode;regular height toilet;ambulating Pt Will Perform Toileting - Clothing Manipulation and hygiene: with modified independence;sit to/from stand;sitting/lateral leans  OT Frequency: Min 2X/week   Barriers to D/C:            Co-evaluation              AM-PAC OT "6 Clicks" Daily Activity     Outcome Measure Help from another person eating meals?: None Help from another person taking care of personal grooming?: A Little Help from another person toileting, which  includes using toliet, bedpan, or urinal?: A Lot Help from another person bathing (including washing, rinsing, drying)?: A Lot Help from another person to put on and taking off regular upper body clothing?: A Little Help from another person to put on and taking off regular lower body clothing?: A  Lot 6 Click Score: 16   End of Session Equipment Utilized During Treatment: Rolling walker Nurse Communication: Mobility status  Activity Tolerance: Patient limited by pain Patient left: in chair;with call bell/phone within reach;with chair alarm set;Other (comment) (with ID MD present)  OT Visit Diagnosis: Unsteadiness on feet (R26.81);Other abnormalities of gait and mobility (R26.89);Muscle weakness (generalized) (M62.81);Pain Pain - Right/Left: Right (bilateral) Pain - part of body:  (back)                Time: 7262-0355 OT Time Calculation (min): 35 min Charges:  OT General Charges $OT Visit: 1 Visit OT Evaluation $OT Eval Moderate Complexity: 1 Mod OT Treatments $Self Care/Home Management : 8-22 mins  Layla Maw, OTR/L  Layla Maw 09/15/2020, 11:17 AM

## 2020-09-16 ENCOUNTER — Inpatient Hospital Stay (HOSPITAL_COMMUNITY): Payer: 59

## 2020-09-16 DIAGNOSIS — G062 Extradural and subdural abscess, unspecified: Secondary | ICD-10-CM | POA: Diagnosis not present

## 2020-09-16 DIAGNOSIS — E669 Obesity, unspecified: Secondary | ICD-10-CM

## 2020-09-16 DIAGNOSIS — I10 Essential (primary) hypertension: Secondary | ICD-10-CM

## 2020-09-16 DIAGNOSIS — E11621 Type 2 diabetes mellitus with foot ulcer: Secondary | ICD-10-CM | POA: Diagnosis not present

## 2020-09-16 DIAGNOSIS — E876 Hypokalemia: Secondary | ICD-10-CM | POA: Diagnosis not present

## 2020-09-16 DIAGNOSIS — G061 Intraspinal abscess and granuloma: Secondary | ICD-10-CM | POA: Diagnosis not present

## 2020-09-16 DIAGNOSIS — E871 Hypo-osmolality and hyponatremia: Secondary | ICD-10-CM | POA: Diagnosis not present

## 2020-09-16 LAB — RENAL FUNCTION PANEL
Albumin: 2.2 g/dL — ABNORMAL LOW (ref 3.5–5.0)
Anion gap: 9 (ref 5–15)
BUN: 16 mg/dL (ref 8–23)
CO2: 26 mmol/L (ref 22–32)
Calcium: 8.4 mg/dL — ABNORMAL LOW (ref 8.9–10.3)
Chloride: 97 mmol/L — ABNORMAL LOW (ref 98–111)
Creatinine, Ser: 0.74 mg/dL (ref 0.44–1.00)
GFR, Estimated: >60 mL/min (ref >60)
Glucose, Bld: 230 mg/dL — ABNORMAL HIGH (ref 70–99)
Phosphorus: 3.5 mg/dL (ref 2.5–4.6)
Potassium: 3.6 mmol/L (ref 3.5–5.1)
Sodium: 132 mmol/L — ABNORMAL LOW (ref 135–145)

## 2020-09-16 LAB — CBC WITH DIFFERENTIAL/PLATELET
Abs Immature Granulocytes: 0.2 10*3/uL — ABNORMAL HIGH (ref 0.00–0.07)
Basophils Absolute: 0.1 10*3/uL (ref 0.0–0.1)
Basophils Relative: 1 %
Eosinophils Absolute: 0.4 10*3/uL (ref 0.0–0.5)
Eosinophils Relative: 3 %
HCT: 35.9 % — ABNORMAL LOW (ref 36.0–46.0)
Hemoglobin: 11.7 g/dL — ABNORMAL LOW (ref 12.0–15.0)
Immature Granulocytes: 2 %
Lymphocytes Relative: 7 %
Lymphs Abs: 0.8 10*3/uL (ref 0.7–4.0)
MCH: 29 pg (ref 26.0–34.0)
MCHC: 32.6 g/dL (ref 30.0–36.0)
MCV: 89.1 fL (ref 80.0–100.0)
Monocytes Absolute: 0.9 10*3/uL (ref 0.1–1.0)
Monocytes Relative: 8 %
Neutro Abs: 9.1 10*3/uL — ABNORMAL HIGH (ref 1.7–7.7)
Neutrophils Relative %: 79 %
Platelets: 407 10*3/uL — ABNORMAL HIGH (ref 150–400)
RBC: 4.03 MIL/uL (ref 3.87–5.11)
RDW: 13.7 % (ref 11.5–15.5)
WBC: 11.5 10*3/uL — ABNORMAL HIGH (ref 4.0–10.5)
nRBC: 0 % (ref 0.0–0.2)

## 2020-09-16 LAB — GLUCOSE, CAPILLARY
Glucose-Capillary: 188 mg/dL — ABNORMAL HIGH (ref 70–99)
Glucose-Capillary: 174 mg/dL — ABNORMAL HIGH (ref 70–99)
Glucose-Capillary: 165 mg/dL — ABNORMAL HIGH (ref 70–99)
Glucose-Capillary: 106 mg/dL — ABNORMAL HIGH (ref 70–99)

## 2020-09-16 LAB — SEDIMENTATION RATE: Sed Rate: 77 mm/hr — ABNORMAL HIGH (ref 0–22)

## 2020-09-16 LAB — C-REACTIVE PROTEIN: CRP: 16.7 mg/dL — ABNORMAL HIGH (ref <1.0)

## 2020-09-16 LAB — MAGNESIUM: Magnesium: 2.1 mg/dL (ref 1.7–2.4)

## 2020-09-16 IMAGING — MR MR FOOT*L* WO/W CM
4 of 9 series · 17 of 40 positions shown · IV contrast (10 GAD)
Comparison: Multiple exams, including radiographs from [DATE]

CLINICAL DATA: Diabetes, foot swelling, possible osteomyelitis.

EXAM:
MRI OF THE LEFT FOREFOOT WITHOUT AND WITH CONTRAST
TECHNIQUE: Multiplanar, multisequence MR imaging of the ankle was performed
before and after the administration of intravenous contrast.
CONTRAST:  10mL GADAVIST GADOBUTROL 1 MMOL/ML IV SOLN

[Series 4: T1 fat-sat · axial · non-contrast · 4.0mm · 0.31mm/px · z∈[-56,+95]mm · 5 of 35 slices shown]
[im 1/35]
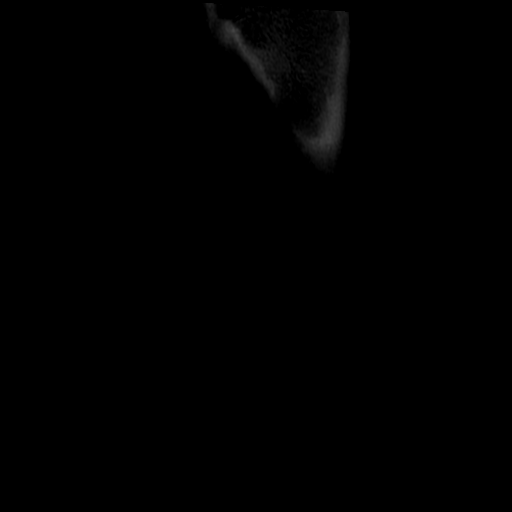
[im 9/35]
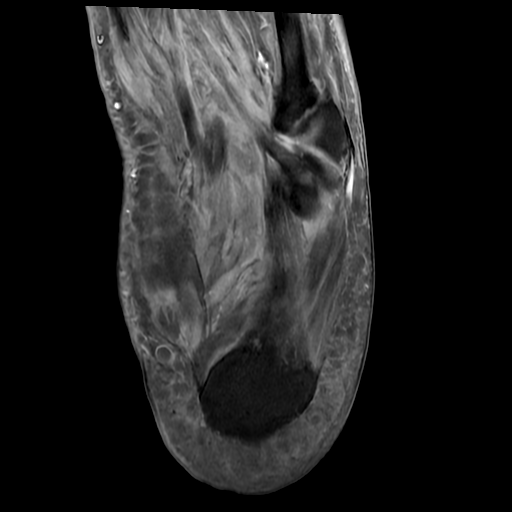
[im 18/35]
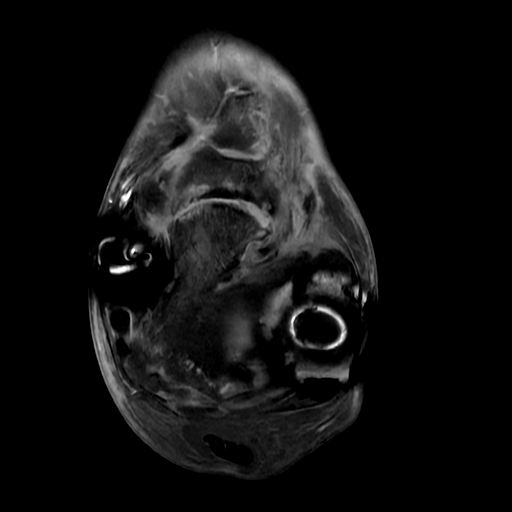
[im 26/35]
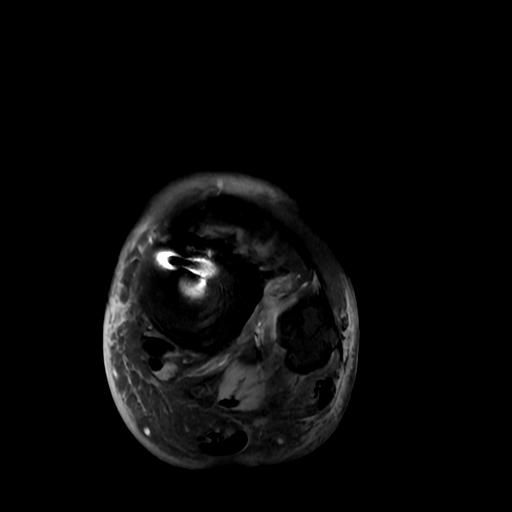
[im 35/35]
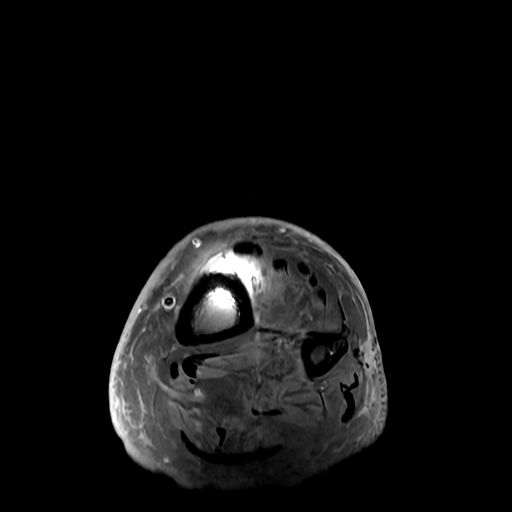

[Series 5: T1 · axial · 4.0mm · 0.31mm/px · z∈[-56,+95]mm · 3 of 35 slices shown]
[im 1/35]
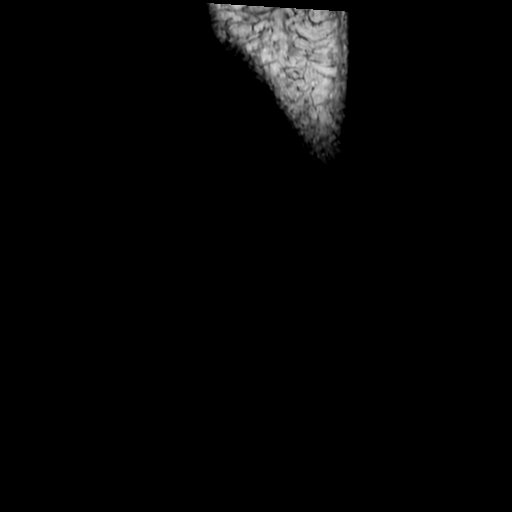
[im 23/35]
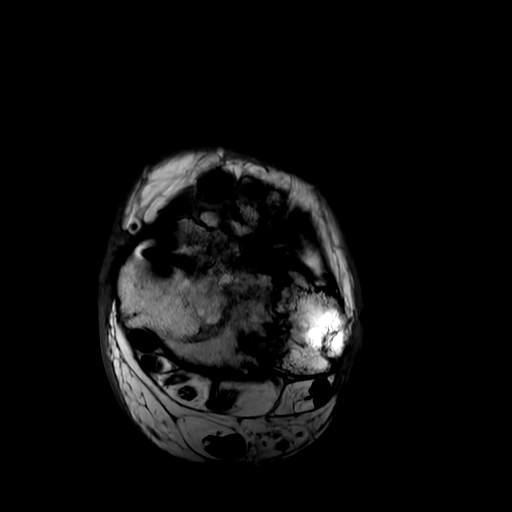
[im 35/35]
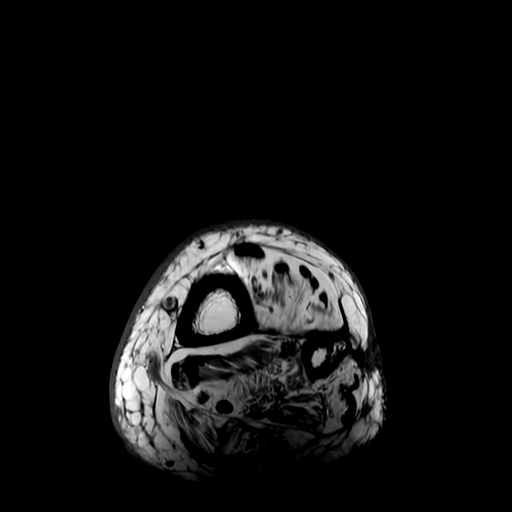

[Series 6: T2 fat-sat · axial · 4.0mm · 0.31mm/px · z∈[-56,+95]mm · 4 of 35 slices shown (1 of 2)]
[im 1/35]
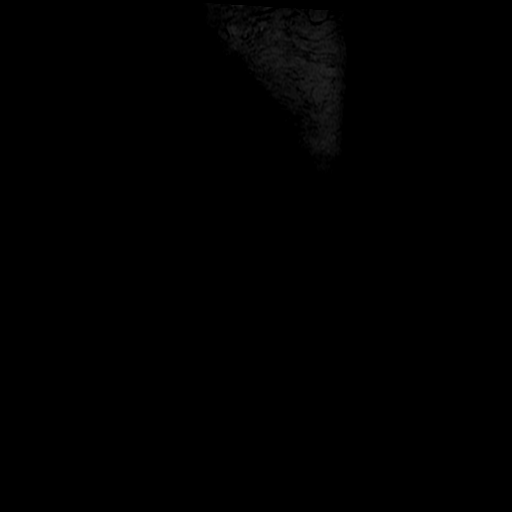
[im 12/35]
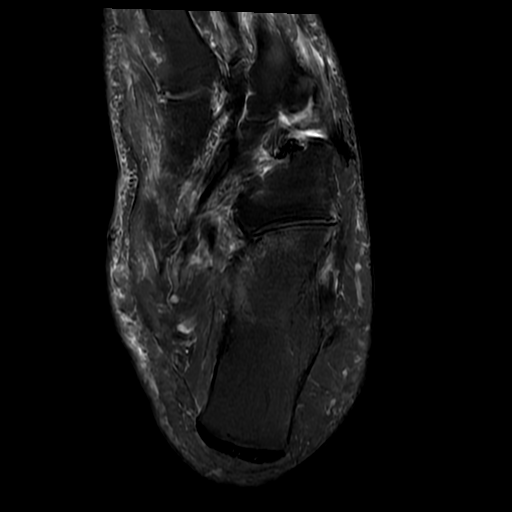
[im 23/35]
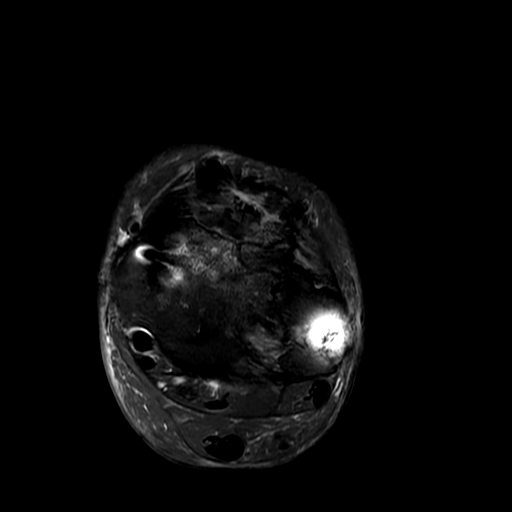
[im 35/35]
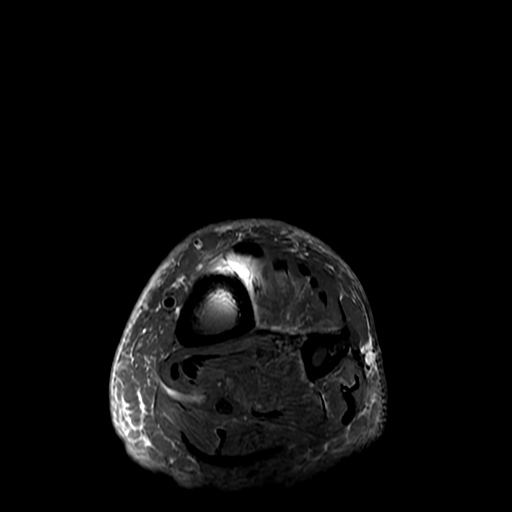

[Series 8: T2 fat-sat · coronal · 3.5mm · 0.31mm/px · 5 of 36 slices shown (2 of 2)]
[im 1/36]
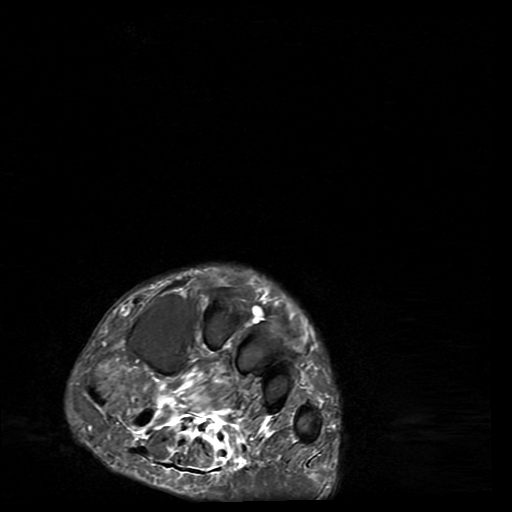
[im 9/36]
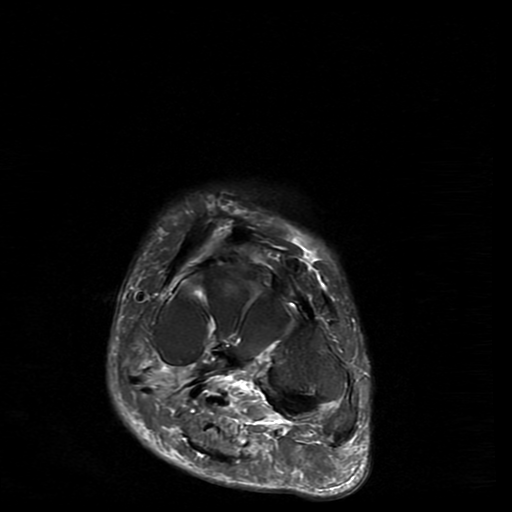
[im 18/36]
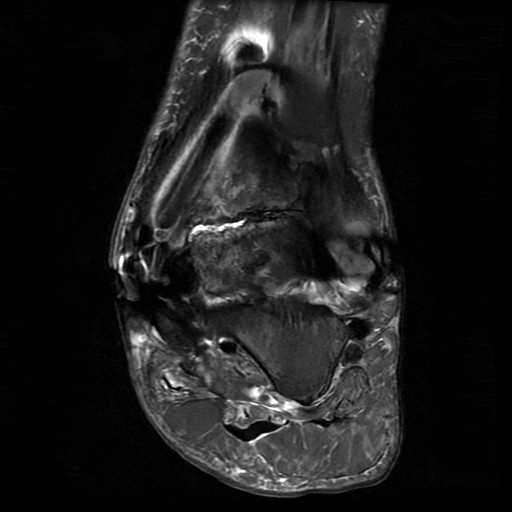
[im 27/36]
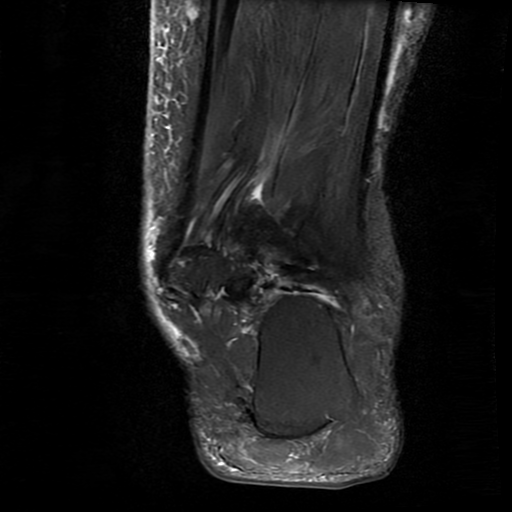
[im 36/36]
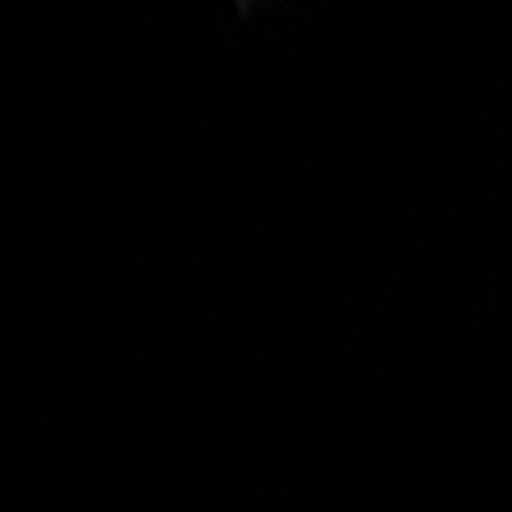

[17 of 40 positions shown; findings below may reference images not displayed]

FINDINGS: TENDONS

Peroneal: Longitudinal split tear of the peroneus brevis adjacent to
the lateral malleolus. Notable peroneus longus tendinopathy adjacent
to the distal calcaneus.

Posteromedial: Partial obscuration of the distal tibialis posterior
tendon due to metal artifact from the fractured K-in the medial
malleolus. Distal tibialis posterior tendinopathy.

Anterior: Mild thickening and tendinopathy of the anterior tendons
as they pass over the distal tibial rim fragment.

Achilles: Mild distal Achilles tendinopathy.

Plantar Fascia: Thickened medial band of the plantar fascia
suggesting plantar fasciitis.

LIGAMENTS

Lateral: Nonvisualization of the lateral ligamentous complex due to
metal artifact and severe deformity due to bony fragmentation and
irregularity.

Medial: Nonvisualization of the deltoid ligament components and
spring ligament due to metal artifact and regional bony deformities.

CARTILAGE

Ankle Joint: Chronically fractured and flattened distal tibia with
chronically fractured and flattened talus favoring Charcot
arthropathy. Severe irregularity of cortical margins along the
tibiotalar articulation with some accentuated T2 signal in this
articulation. Chronic anterior tibial fragment measures 1.9 by
by 5.3 cm.

Subtalar Joints/Sinus Tarsi: degenerative arthropathy in the
subtalar joints with some mild sparing of the posterior facet.

Bones: Severe tibiotalar arthropathy with chronic fractures and
possible Charcot joint as noted above. Marrow edema in the distal
tibia, talus, anterior calcaneus, and the navicular, the with an
effusion of the talonavicular and calcaneal navicular articulations.
The degree of edema and enhancement is proportionate to the severe
arthropathy and does not necessarily implicate osteomyelitis. I do
not see any draining sinus tract but if the patient does have such a
tract based on physical inspection in the possibility of infection
might be heightened.

There is a fractured K-wire the medial malleolus. There is a
fragment from a prior screw in the lateral malleolus.

Dorsal midfoot spurring. There is degenerative arthropathy at the
articulation between the lateral cuneiform and the third metatarsal
dorsally as shown on image 22 of series 12.

Other: Atrophic plantar musculature in the foot. Atrophic stents are
digitorum brevis muscle.
IMPRESSION: IMPRESSION
1. Charcot arthropathy along the tibiotalar joint and Chopart joint.
No compelling findings of osteomyelitis although there can sometimes
be some overlap in the edema, enhancement, and fragmentation from
Charcot arthropathy and infection; if the patient has other clinical
signs of infection such as a draining sinus tract then the
possibility of superimposed infection might be heightened. No
drainable abscess identified.
2. There is a chronically fractured K-wire the medial malleolus.
Metal screw fragment in the lateral malleolus.
3. Nonvisualization of the lateral ligamentous complex, deltoid
ligament components, and spring ligament due to metal artifact and
regional bony deformities.
4. Longitudinal split tear of the peroneus brevis adjacent to the
lateral malleolus. Notable peroneus longus tendinopathy adjacent to
the distal calcaneus.
5. Mild distal Achilles tendinopathy.
6. Thickened medial band of the plantar fascia suggesting plantar
fasciitis.
7. Atrophic plantar musculature in the foot.

## 2020-09-16 MED ORDER — INSULIN DETEMIR 100 UNIT/ML ~~LOC~~ SOLN
25.0000 [IU] | Freq: Two times a day (BID) | SUBCUTANEOUS | Status: DC
  Administered 2020-09-16 – 2020-09-17 (×3): 25 [IU] via SUBCUTANEOUS
  Filled 2020-09-16 (×5): qty 0.25

## 2020-09-16 MED ORDER — GADOBUTROL 1 MMOL/ML IV SOLN
10.0000 mL | Freq: Once | INTRAVENOUS | Status: AC | PRN
  Administered 2020-09-16: 10 mL via INTRAVENOUS

## 2020-09-16 NOTE — Progress Notes (Signed)
[facility]  Date of Admission:  09/11/2020     Total days of antibiotics: 3        Current antibiotics: vancomycin, ceftriaxone  Reason for visit: Follow up on vertebral infection, foot infection  Assessment: Brittany Silva is a 64 y.o. female.  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  Status post IR guided aspiration with GPC's in chains/pairs on Gram stain.  Cultures pending.  Currently on ceftriaxone and vancomycin while her cultures mature.  Also with concern for a deeper infection of her left foot given exam findings and history of prior fracture with hardware.  ID issues: # Epidural abscess, facet joint infection # Diabetic foot ulcer  Other issues: # Diabetes # HTN # Obesity  Recommendations: --Continue vancomycin and ceftriaxone --Await MRI of the foot --Pain control per primary --Follow-up micro as becomes available   Scheduled Meds: . acetaminophen  1,000 mg Oral Q8H  . amLODipine  10 mg Oral Daily  . atorvastatin  40 mg Oral Daily  . DULoxetine  90 mg Oral Daily  . enoxaparin (LOVENOX) injection  65 mg Subcutaneous Q24H  . insulin aspart  0-20 Units Subcutaneous TID WC  . insulin aspart  0-5 Units Subcutaneous QHS  . insulin aspart  12 Units Subcutaneous TID WC  . insulin detemir  20 Units Subcutaneous BID  . levothyroxine  75 mcg Oral Q0600  . multivitamin with minerals  1 tablet Oral Daily  . Ensure Max Protein  11 oz Oral QHS  . traMADol  50 mg Oral Q8H   Continuous Infusions: . cefTRIAXone (ROCEPHIN)  IV 2 g (09/15/20 1402)  . vancomycin 1,250 mg (09/16/20 0345)   PRN Meds:.hydrALAZINE, HYDROmorphone (DILAUDID) injection, ondansetron **OR** ondansetron (ZOFRAN) IV, polyethylene glycol, senna-docusate  Last 24-Hour Events: No acute events noted overnight Abscess cultures reintubated for better growth Continues on vancomycin and ceftriaxone MRI  pending  Subjective: She feels relatively the same today.  Still having back pain.  Left foot discomfort unchanged.  Awaiting MRI.  Allergies  Allergen Reactions  . Hydrochlorothiazide     rash  . Lisinopril     Other reaction(s): Cough  . Adhesive [Tape]   . Codeine Phosphate     REACTION: throat swelling  . Darvon Other (See Comments)    Jittering, skin problems  . Darvon [Propoxyphene Hcl]     Hyperactivity; "makes me crazy"  . Hydrocodone-Acetaminophen Itching    *Has tolerated hydromorphone as an adult* Per pt, when she was a child had throat itching, difficulty breathing and feel strange and also vomit   . Propoxyphene Hcl     REACTION: Makes her crazy  . Latex Rash  . Neosporin [Neomycin-Bacitracin Zn-Polymyx] Rash  . Sulfamethoxazole Itching and Rash     Review of Systems: Review of Systems  Constitutional: Negative for chills and fever.  Respiratory: Negative.   Cardiovascular: Negative.   Musculoskeletal: Positive for back pain and joint pain.  Skin: Negative for rash.  All other systems reviewed and are negative.    Objective: Blood pressure 134/77, pulse 75, temperature 98.7 F (37.1 C), temperature source Oral, resp. rate 17, height 5\' 7"  (1.702 m), weight 129.3 kg, SpO2 96 %. Body mass index is 44.64 kg/m.  Physical Exam Constitutional:      General: She is not in acute distress.    Appearance: Normal appearance.  HENT:     Head: Normocephalic and atraumatic.  Pulmonary:     Effort: Pulmonary effort is normal. No respiratory distress.  Musculoskeletal:     Comments: Left #4 toe with ulceration some edema.   Skin:    General: Skin is warm and dry.  Neurological:     General: No focal deficit present.     Mental Status: She is alert and oriented to person, place, and time.  Psychiatric:        Mood and Affect: Mood normal.        Behavior: Behavior normal.       Lab Results: Lab Results  Component Value Date   WBC 11.5 (H) 09/16/2020    HGB 11.7 (L) 09/16/2020   HCT 35.9 (L) 09/16/2020   MCV 89.1 09/16/2020   PLT 407 (H) 09/16/2020    Lab Results  Component Value Date   CREATININE 0.74 09/16/2020   BUN 16 09/16/2020   NA 132 (L) 09/16/2020   K 3.6 09/16/2020   CL 97 (L) 09/16/2020   CO2 26 09/16/2020    Lab Results  Component Value Date   ALT 22 09/11/2020   AST 37 09/11/2020   ALKPHOS 128 (H) 09/11/2020     Microbiology: Recent Results (from the past 240 hour(s))  Urine culture     Status: Abnormal   Collection Time: 09/08/20 10:30 PM   Specimen: Urine, Random  Result Value Ref Range Status   Specimen Description   Final    URINE, RANDOM Performed at [facility], [address] [place], [place] [address]    Special Requests   Final    NONE Performed at [facility], [address] [place], [place] [address]    Culture 80,000 COLONIES/mL ESCHERICHIA COLI (A)  Final   Report Status 09/11/2020 FINAL  Final   Organism ID, Bacteria ESCHERICHIA COLI (A)  Final      Susceptibility   Escherichia coli - MIC*    AMPICILLIN >=32 RESISTANT Resistant     CEFAZOLIN <=4 SENSITIVE Sensitive     CEFTRIAXONE <=0.25 SENSITIVE Sensitive     CIPROFLOXACIN <=0.25 SENSITIVE Sensitive     GENTAMICIN <=1 SENSITIVE Sensitive     IMIPENEM <=0.25 SENSITIVE Sensitive     NITROFURANTOIN <=16 SENSITIVE Sensitive     TRIMETH/SULFA <=20 SENSITIVE Sensitive     AMPICILLIN/SULBACTAM 8 SENSITIVE Sensitive     PIP/TAZO <=4 SENSITIVE Sensitive     * 80,000 COLONIES/mL ESCHERICHIA COLI  Respiratory Panel by RT PCR (Flu A&B, Covid) - Nasopharyngeal Swab     Status: None   Collection Time: 09/12/20  1:43 PM   Specimen: Nasopharyngeal Swab  Result Value Ref Range Status   SARS Coronavirus 2 by RT PCR NEGATIVE NEGATIVE Final    Comment: (NOTE) SARS-CoV-2 target nucleic acids are NOT DETECTED.  The SARS-CoV-2 RNA is generally detectable in upper respiratoy specimens during the acute phase of infection.  The lowest concentration of SARS-CoV-2 viral copies this assay can detect is 131 copies/mL. A negative result does not preclude SARS-Cov-2 infection and should not be used as the sole basis for treatment or other patient management decisions. A negative result may occur with  improper specimen collection/handling, submission of specimen other than nasopharyngeal swab, presence of viral mutation(s) within the areas targeted by this assay, and inadequate number of viral copies (<131 copies/mL). A negative result must be combined with clinical observations, patient history, and epidemiological information. The expected result is Negative.  Fact Sheet for Patients:  PinkCheek.be  Fact  Sheet for Healthcare Providers:  GravelBags.it  This test is no t yet approved or cleared by the [place] FDA and  has been authorized for detection and/or diagnosis of SARS-CoV-2 by FDA under an Emergency Use Authorization (EUA). This EUA will remain  in effect (meaning this test can be used) for the duration of the COVID-19 declaration under Section 564(b)(1) of the Act, 21 U.S.C. section 360bbb-3(b)(1), unless the authorization is terminated or revoked sooner.     Influenza A by PCR NEGATIVE NEGATIVE Final   Influenza B by PCR NEGATIVE NEGATIVE Final    Comment: (NOTE) The Xpert Xpress SARS-CoV-2/FLU/RSV assay is intended as an aid in  the diagnosis of influenza from Nasopharyngeal swab specimens and  should not be used as a sole basis for treatment. Nasal washings and  aspirates are unacceptable for Xpert Xpress SARS-CoV-2/FLU/RSV  testing.  Fact Sheet for Patients: PinkCheek.be  Fact Sheet for Healthcare Providers: GravelBags.it  This test is not yet approved or cleared by the [place] FDA and  has been authorized for detection and/or diagnosis of SARS-CoV-2 by  FDA  under an Emergency Use Authorization (EUA). This EUA will remain  in effect (meaning this test can be used) for the duration of the  Covid-19 declaration under Section 564(b)(1) of the Act, 21  U.S.C. section 360bbb-3(b)(1), unless the authorization is  terminated or revoked. Performed at [facility], [place] [address], [place], [place] [address]   Aerobic/Anaerobic Culture (surgical/deep wound)     Status: None (Preliminary result)   Collection Time: 09/14/20  1:52 PM   Specimen: Abscess; Synovial Fluid  Result Value Ref Range Status   Specimen Description ABSCESS  Final   Special Requests RIGHT L4 L5 JOINT  Final   Gram Stain   Final    ABUNDANT WBC PRESENT, PREDOMINANTLY PMN MODERATE GRAM POSITIVE COCCI IN PAIRS IN CHAINS    Culture   Final    CULTURE REINCUBATED FOR BETTER GROWTH Performed at [facility], [address] [place], [place] [address]    Report Status PENDING  Incomplete    I have reviewed the micro and lab results.  Imaging: DG Ankle 2 Views Left  Result Date: 09/15/2020 CLINICAL DATA:  Chronic left ankle pain. EXAM: LEFT ANKLE - 2 VIEW COMPARISON:  Left foot x-rays dated Apr 05, 2014. FINDINGS: No acute fracture or dislocation. Chronic fracture of the medial malleolar pin. Abandoned screw in the lateral malleolus. Severe tibiotalar osteoarthritis with joint space narrowing and marked bony hypertrophy. Mild dorsal degenerative spurring of the second TMT joint. Bone mineralization is normal. Soft tissues are unremarkable. IMPRESSION: 1. Severe post-traumatic tibiotalar osteoarthritis. Electronically Signed   By: Titus Dubin M.D.   On: 09/15/2020 12:45   ECHOCARDIOGRAM COMPLETE  Result Date: 09/15/2020    ECHOCARDIOGRAM REPORT   Patient Name:   Brittany Silva Date of Exam: 09/15/2020 Medical Rec #:  [id number]        Height:       67.0 in Accession #:    [id number]       Weight:       285.0 lb Date of Birth:  1956-05-04        BSA:           2.351 m Patient Age:    6 years         BP:           141/75 mmHg Patient Gender: F  HR:           80 bpm. Exam Location:  Inpatient Procedure: 2D Echo Indications:    Fever 780.6 / R50.9  History:        Patient has no prior history of Echocardiogram examinations.                 Risk Factors:Diabetes and Hypertension. History of chronic back                 pain, thyroid disease. Presenting with back pain, bilateral leg                 pain, nausea, vomiting, fever and chills. Acute cystitis.                 Epidural abscess.  Sonographer:    [name] RDCS Referring Phys: [place]  1. Left ventricular ejection fraction, by estimation, is 60 to 65%. The left ventricle has normal function. The left ventricle has no regional wall motion abnormalities. There is mild left ventricular hypertrophy. Left ventricular diastolic parameters were normal.  2. Right ventricular systolic function is normal. The right ventricular size is normal. Tricuspid regurgitation signal is inadequate for assessing PA pressure.  3. The mitral valve is normal in structure. Trivial mitral valve regurgitation. No evidence of mitral stenosis.  4. The aortic valve is grossly normal. Unable to determine aortic valve morphology due to image quality. Aortic valve regurgitation is not visualized. No aortic stenosis is present. Conclusion(s)/Recommendation(s): No evidence of valvular vegetations on this transthoracic echocardiogram. Would recommend a transesophageal echocardiogram to exclude infective endocarditis if clinically indicated. FINDINGS  Left Ventricle: Left ventricular ejection fraction, by estimation, is 60 to 65%. The left ventricle has normal function. The left ventricle has no regional wall motion abnormalities. The left ventricular internal cavity size was normal in size. There is  mild left ventricular hypertrophy. Left ventricular diastolic parameters were normal. Right Ventricle:  The right ventricular size is normal. No increase in right ventricular wall thickness. Right ventricular systolic function is normal. Tricuspid regurgitation signal is inadequate for assessing PA pressure. Left Atrium: Left atrial size was normal in size. Right Atrium: Right atrial size was normal in size. Pericardium: There is no evidence of pericardial effusion. Mitral Valve: The mitral valve is normal in structure. Mild mitral annular calcification. Trivial mitral valve regurgitation. No evidence of mitral valve stenosis. Tricuspid Valve: The tricuspid valve is normal in structure. Tricuspid valve regurgitation is trivial. No evidence of tricuspid stenosis. Aortic Valve: The aortic valve is grossly normal. Aortic valve regurgitation is not visualized. No aortic stenosis is present. Pulmonic Valve: The pulmonic valve was normal in structure. Pulmonic valve regurgitation is trivial. No evidence of pulmonic stenosis. Aorta: The aortic root is normal in size and structure. Venous: The inferior vena cava was not well visualized. IAS/Shunts: The interatrial septum was not well visualized.  LEFT VENTRICLE PLAX 2D LVIDd:         4.30 cm  Diastology LVIDs:         3.20 cm  LV e' medial:    5.68 cm/s LV PW:         1.10 cm  LV E/e' medial:  15.8 LV IVS:        1.30 cm  LV e' lateral:   7.22 cm/s LVOT diam:     2.10 cm  LV E/e' lateral: 12.4 LV SV:         61 LV SV Index:  26 LVOT Area:     3.46 cm  RIGHT VENTRICLE RV S prime:     13.10 cm/s TAPSE (M-mode): 2.4 cm LEFT ATRIUM             Index       RIGHT ATRIUM           Index LA diam:        4.40 cm 1.87 cm/m  RA Area:     13.70 cm LA Vol (A2C):   34.5 ml 14.68 ml/m RA Volume:   30.00 ml  12.76 ml/m LA Vol (A4C):   44.0 ml 18.72 ml/m LA Biplane Vol: 39.3 ml 16.72 ml/m  AORTIC VALVE LVOT Vmax:   85.50 cm/s LVOT Vmean:  57.400 cm/s LVOT VTI:    0.177 m  AORTA Ao Root diam: 3.00 cm MITRAL VALVE MV Area (PHT): 4.60 cm    SHUNTS MV Decel Time: 165 msec    Systemic  VTI:  0.18 m MV E velocity: 89.50 cm/s  Systemic Diam: 2.10 cm MV A velocity: 77.60 cm/s MV E/A ratio:  1.15 Cherlynn Kaiser MD Electronically signed by Cherlynn Kaiser MD Signature Date/Time: 09/15/2020/3:50:04 PM    Final      [facility] [phone] pager 09/16/2020, 8:44 AM

## 2020-09-16 NOTE — Progress Notes (Signed)
PROGRESS NOTE  PAGE Brittany Silva:[id number] DOB: [birth year]   PCP: Carol Ada, MD  Patient is from: Home  DOA: 09/11/2020 LOS: 4  Chief complaints: Back pain  Brief Narrative / Interim history: 64 year old female with history of chronic back pain, DM-2, HTN, hypothyroidism, morbid obesity and recent UTI presenting with 5 days of lower back pain, bilateral leg pain, nausea, vomiting, fever and chills.  In ED, MRI lumbar spine concerning for lumbar epidural abscess around the L5-S1 disc space and soft tissue enhancement on the right posterior to the L4-5 and L5-S1 facet joints.  Neurosurgery consulted, and recommended transfer to [facility].  Concerned that a diabetic foot ulcer in her left fourth toe could be source of infection. She also has hardwares in her left foot.  The next day, evaluated by neurosurgery who recommended IR biopsy for tissue diagnosis and ID consult for antibiotic therapy. IR consulted. Patient underwent fluoroscopy guided needle aspiration.  Tissue culture sent.  Started on IV vancomycin, cefepime and Flagyl.  ID consulted and adjusted antibiotic to IV vancomycin and ceftriaxone.  Also ordered MRI foot that was concerning for Charcot arthropathy versus osteomyelitis  Subjective: Seen and examined earlier this morning.  No major events overnight of this morning.  No complaints.  Pain fairly controlled today.  She rates her pain 4/10.  No new focal neuro deficit.  No bowel or bladder issue.  Objective: Vitals:   09/16/20 0241 09/16/20 0820 09/16/20 0943 09/16/20 1421  BP: (!) 162/65 134/77 134/77 (!) 159/67  Pulse: 70 75  82  Resp: '18 17  16  ' Temp: 97.6 F (36.4 C) 98.7 F (37.1 C)  97.7 F (36.5 C)  TempSrc: Oral Oral  Oral  SpO2: 95% 96%  95%  Weight:      Height:        Intake/Output Summary (Last 24 hours) at 09/16/2020 1457 Last data filed at 09/16/2020 0345 Gross per 24 hour  Intake 600 ml  Output --  Net 600 ml   Filed Weights   09/13/20  0739  Weight: 129.3 kg    Examination:  GENERAL: No apparent distress.  Nontoxic. HEENT: MMM.  Vision and hearing grossly intact.  NECK: Supple.  No apparent JVD.  RESP: On room air.  No IWOB.  Fair aeration bilaterally. CVS:  RRR. Heart sounds normal.  ABD/GI/GU: BS+. Abd soft, NTND.  MSK/EXT:  Moves extremities. No apparent deformity. No edema.  SKIN: no apparent skin lesion or wound NEURO: AAO x4.  Motor 4/5 in BLE. PSYCH: Calm. Normal affect.    Procedures:  10/14-fluoroscopy guided needle aspiration of facet joints  Microbiology summarized: COVID-19 PCR negative.  Assessment & Plan: Acute on chronic low back pain/radiculopathy L5-S1 epidural abscess-no signs of cauda equina L4-5 and L5-S1 facet joint infection.  Left foot pain with left 4th to diabetic wound -Imaging as above.  CRP 23>> 17.  ESR 85>> 77.  Mild leukocytosis. -Neurosurgery consulted and recommended IR biopsy and ID consult for antibiotics. -Underwent fluoroscopy guided needle aspiration of the facet joints. -Vancomycin, cefepime and Flagyl 10/14-10/15 -Vancomycin and ceftriaxone 10/15>> per ID. -Follow surgical tissue cultures grew few Streptococcus mitis/oralis -Pain control scheduled Tylenol and tramadol, and IV Dilaudid for breakthrough pain -Bowel regimen with as needed MiraLAX and Senokot-S -PT/OT eval   Uncontrolled DM-2 with hypo- and hyperglycemia: On Levemir 75 units twice daily and SSI.  A1c 8.6%. Recent Labs  Lab 09/15/20 1225 09/15/20 1701 09/15/20 2120 09/16/20 0653 09/16/20 1302  GLUCAP 277* 148* 137*  188* 174*  -Continue SSI-high, NovoLog AC 12 units  -Increase Levemir 20 to 25 units twice daily -Continue atorvastatin  Essential hypertension: BP slightly elevated. -Continue home amlodipine -Resume home losartan.  Hypothyroidism -Continue home Synthroid  Hypokalemia-resolved.  Hyponatremia: Na 132.  Stable. -Recheck.  Acute cystitis-treated with IV ceftriaxone x1 and  Keflex x3 days -Should be covered with antibiotics as above.  Morbid obesity Body mass index is 44.64 kg/m. Nutrition Problem: Increased nutrient needs Etiology: acute illness Signs/Symptoms: estimated needs Interventions: MVI, Premier Protein   DVT prophylaxis:  Place and maintain sequential compression device Start: 09/12/20 1806  Code Status: Full code Family Communication: Patient and/or RN. Available if any question.  Status is: Inpatient  Remains inpatient appropriate because:Ongoing diagnostic testing needed not appropriate for outpatient work up, IV treatments appropriate due to intensity of illness or inability to take PO and Inpatient level of care appropriate due to severity of illness   Dispo: The patient is from: Home              Anticipated d/c is to: Home              Anticipated d/c date is: > 3 days              Patient currently is not medically stable to d/c.       Consultants:  Neurosurgery-signed off. IR-off. Infectious disease   Sch Meds:  Scheduled Meds: . acetaminophen  1,000 mg Oral Q8H  . amLODipine  10 mg Oral Daily  . atorvastatin  40 mg Oral Daily  . DULoxetine  90 mg Oral Daily  . enoxaparin (LOVENOX) injection  65 mg Subcutaneous Q24H  . insulin aspart  0-20 Units Subcutaneous TID WC  . insulin aspart  0-5 Units Subcutaneous QHS  . insulin aspart  12 Units Subcutaneous TID WC  . insulin detemir  20 Units Subcutaneous BID  . levothyroxine  75 mcg Oral Q0600  . multivitamin with minerals  1 tablet Oral Daily  . Ensure Max Protein  11 oz Oral QHS  . traMADol  50 mg Oral Q8H   Continuous Infusions: . cefTRIAXone (ROCEPHIN)  IV 2 g (09/15/20 1402)  . vancomycin 1,250 mg (09/16/20 0345)   PRN Meds:.hydrALAZINE, HYDROmorphone (DILAUDID) injection, ondansetron **OR** ondansetron (ZOFRAN) IV, polyethylene glycol, senna-docusate  Antimicrobials: Anti-infectives (From admission, onward)   Start     Dose/Rate Route Frequency Ordered  Stop   09/15/20 1500  cefTRIAXone (ROCEPHIN) 2 g in sodium chloride 0.9 % 100 mL IVPB        2 g 200 mL/hr over 30 Minutes Intravenous Every 24 hours 09/15/20 1049     09/15/20 0400  vancomycin (VANCOREADY) IVPB 1250 mg/250 mL        1,250 mg 166.7 mL/hr over 90 Minutes Intravenous Every 12 hours 09/14/20 1416     09/14/20 1500  vancomycin (VANCOCIN) 2,500 mg in sodium chloride 0.9 % 500 mL IVPB        2,500 mg 250 mL/hr over 120 Minutes Intravenous  Once 09/14/20 1410 09/14/20 1901   09/14/20 1430  metroNIDAZOLE (FLAGYL) IVPB 500 mg  Status:  Discontinued        500 mg 100 mL/hr over 60 Minutes Intravenous Every 8 hours 09/14/20 1358 09/15/20 1343   09/14/20 1430  ceFEPIme (MAXIPIME) 2 g in sodium chloride 0.9 % 100 mL IVPB  Status:  Discontinued        2 g 200 mL/hr over 30 Minutes Intravenous Every 8 hours  09/14/20 1410 09/15/20 1049       I have personally reviewed the following labs and images: CBC: Recent Labs  Lab 09/12/20 1811 09/13/20 1525 09/14/20 0106 09/15/20 0322 09/16/20 0155  WBC 11.7* 9.7 10.8* 11.1* 11.5*  NEUTROABS  --  7.7 8.6*  --  9.1*  HGB 13.5 12.3 12.3 11.4* 11.7*  HCT 40.5 36.5 36.9 35.0* 35.9*  MCV 88.2 85.9 86.2 86.6 89.1  PLT 320 378 417* 413* 407*   BMP &GFR Recent Labs  Lab 09/11/20 2239 09/12/20 1811 09/13/20 1525 09/14/20 0106 09/15/20 0322 09/16/20 0155  NA 134*  --  131*  --  131* 132*  K 3.1*  --  4.1  --  3.9 3.6  CL 97*  --  95*  --  97* 97*  CO2 28  --  24  --  23 26  GLUCOSE 76  --  219*  --  279* 230*  BUN 17  --  8  --  15 16  CREATININE 0.89 0.68 0.72  --  0.77 0.74  CALCIUM 8.1*  --  8.1*  --  8.0* 8.4*  MG  --  2.4 2.1 2.1 2.3 2.1  PHOS  --   --  2.1*  --  3.3 3.5   Estimated Creatinine Clearance: 99.5 mL/min (by C-G formula based on SCr of 0.74 mg/dL). Liver & Pancreas: Recent Labs  Lab 09/11/20 2359 09/13/20 1525 09/15/20 0322 09/16/20 0155  AST 37  --   --   --   ALT 22  --   --   --   ALKPHOS 128*  --    --   --   BILITOT 0.6  --   --   --   PROT 6.9  --   --   --   ALBUMIN 2.8* 2.4* 2.3* 2.2*   No results for input(s): LIPASE, AMYLASE in the last 168 hours. No results for input(s): AMMONIA in the last 168 hours. Diabetic: No results for input(s): HGBA1C in the last 72 hours. Recent Labs  Lab 09/15/20 1225 09/15/20 1701 09/15/20 2120 09/16/20 0653 09/16/20 1302  GLUCAP 277* 148* 137* 188* 174*   Cardiac Enzymes: No results for input(s): CKTOTAL, CKMB, CKMBINDEX, TROPONINI in the last 168 hours. No results for input(s): PROBNP in the last 8760 hours. Coagulation Profile: Recent Labs  Lab 09/14/20 0106  INR 1.1   Thyroid Function Tests: No results for input(s): TSH, T4TOTAL, FREET4, T3FREE, THYROIDAB in the last 72 hours. Lipid Profile: No results for input(s): CHOL, HDL, LDLCALC, TRIG, CHOLHDL, LDLDIRECT in the last 72 hours. Anemia Panel: No results for input(s): VITAMINB12, FOLATE, FERRITIN, TIBC, IRON, RETICCTPCT in the last 72 hours. Urine analysis:    Component Value Date/Time   COLORURINE YELLOW 09/12/2020 0528   APPEARANCEUR CLEAR 09/12/2020 0528   LABSPEC 1.022 09/12/2020 0528   PHURINE 5.0 09/12/2020 0528   GLUCOSEU >=500 (A) 09/12/2020 0528   HGBUR NEGATIVE 09/12/2020 0528   HGBUR trace-lysed 05/16/2008 0849   BILIRUBINUR NEGATIVE 09/12/2020 0528   KETONESUR NEGATIVE 09/12/2020 0528   PROTEINUR NEGATIVE 09/12/2020 0528   UROBILINOGEN 0.2 05/16/2008 0849   NITRITE NEGATIVE 09/12/2020 0528   LEUKOCYTESUR NEGATIVE 09/12/2020 0528   Sepsis Labs: Invalid input(s): PROCALCITONIN, [place]  Microbiology: Recent Results (from the past 240 hour(s))  Urine culture     Status: Abnormal   Collection Time: 09/08/20 10:30 PM   Specimen: Urine, Random  Result Value Ref Range Status   Specimen Description   Final  URINE, RANDOM Performed at [facility], [address] [place], [place] [address]    Special Requests   Final    NONE Performed  at [facility], [address] [place], [place] [address]    Culture 80,000 COLONIES/mL ESCHERICHIA COLI (A)  Final   Report Status 09/11/2020 FINAL  Final   Organism ID, Bacteria ESCHERICHIA COLI (A)  Final      Susceptibility   Escherichia coli - MIC*    AMPICILLIN >=32 RESISTANT Resistant     CEFAZOLIN <=4 SENSITIVE Sensitive     CEFTRIAXONE <=0.25 SENSITIVE Sensitive     CIPROFLOXACIN <=0.25 SENSITIVE Sensitive     GENTAMICIN <=1 SENSITIVE Sensitive     IMIPENEM <=0.25 SENSITIVE Sensitive     NITROFURANTOIN <=16 SENSITIVE Sensitive     TRIMETH/SULFA <=20 SENSITIVE Sensitive     AMPICILLIN/SULBACTAM 8 SENSITIVE Sensitive     PIP/TAZO <=4 SENSITIVE Sensitive     * 80,000 COLONIES/mL ESCHERICHIA COLI  Respiratory Panel by RT PCR (Flu A&B, Covid) - Nasopharyngeal Swab     Status: None   Collection Time: 09/12/20  1:43 PM   Specimen: Nasopharyngeal Swab  Result Value Ref Range Status   SARS Coronavirus 2 by RT PCR NEGATIVE NEGATIVE Final    Comment: (NOTE) SARS-CoV-2 target nucleic acids are NOT DETECTED.  The SARS-CoV-2 RNA is generally detectable in upper respiratoy specimens during the acute phase of infection. The lowest concentration of SARS-CoV-2 viral copies this assay can detect is 131 copies/mL. A negative result does not preclude SARS-Cov-2 infection and should not be used as the sole basis for treatment or other patient management decisions. A negative result may occur with  improper specimen collection/handling, submission of specimen other than nasopharyngeal swab, presence of viral mutation(s) within the areas targeted by this assay, and inadequate number of viral copies (<131 copies/mL). A negative result must be combined with clinical observations, patient history, and epidemiological information. The expected result is Negative.  Fact Sheet for Patients:  PinkCheek.be  Fact Sheet for Healthcare Providers:    GravelBags.it  This test is no t yet approved or cleared by the [place] FDA and  has been authorized for detection and/or diagnosis of SARS-CoV-2 by FDA under an Emergency Use Authorization (EUA). This EUA will remain  in effect (meaning this test can be used) for the duration of the COVID-19 declaration under Section 564(b)(1) of the Act, 21 U.S.C. section 360bbb-3(b)(1), unless the authorization is terminated or revoked sooner.     Influenza A by PCR NEGATIVE NEGATIVE Final   Influenza B by PCR NEGATIVE NEGATIVE Final    Comment: (NOTE) The Xpert Xpress SARS-CoV-2/FLU/RSV assay is intended as an aid in  the diagnosis of influenza from Nasopharyngeal swab specimens and  should not be used as a sole basis for treatment. Nasal washings and  aspirates are unacceptable for Xpert Xpress SARS-CoV-2/FLU/RSV  testing.  Fact Sheet for Patients: PinkCheek.be  Fact Sheet for Healthcare Providers: GravelBags.it  This test is not yet approved or cleared by the [place] FDA and  has been authorized for detection and/or diagnosis of SARS-CoV-2 by  FDA under an Emergency Use Authorization (EUA). This EUA will remain  in effect (meaning this test can be used) for the duration of the  Covid-19 declaration under Section 564(b)(1) of the Act, 21  U.S.C. section 360bbb-3(b)(1), unless the authorization is  terminated or revoked. Performed at [facility], [address] [place], [place]  [address]   Aerobic/Anaerobic Culture (surgical/deep wound)     Status: None (Preliminary result)   Collection Time: 09/14/20  1:52 PM   Specimen: Abscess; Synovial Fluid  Result Value Ref Range Status   Specimen Description ABSCESS  Final   Special Requests RIGHT L4 L5 JOINT  Final   Gram Stain   Final    ABUNDANT WBC PRESENT, PREDOMINANTLY PMN MODERATE GRAM POSITIVE COCCI IN PAIRS IN  CHAINS Performed at [facility], [address] [place], [place] [address]    Culture   Final    FEW STREPTOCOCCUS MITIS/ORALIS SUSCEPTIBILITIES TO FOLLOW NO ANAEROBES ISOLATED; CULTURE IN PROGRESS FOR 5 DAYS    Report Status PENDING  Incomplete    Radiology Studies: MR FOOT LEFT W WO CONTRAST  Result Date: 09/16/2020 CLINICAL DATA:  Diabetes, foot swelling, possible osteomyelitis. EXAM: MRI OF THE LEFT FOREFOOT WITHOUT AND WITH CONTRAST TECHNIQUE: Multiplanar, multisequence MR imaging of the ankle was performed before and after the administration of intravenous contrast. CONTRAST:  57m GADAVIST GADOBUTROL 1 MMOL/ML IV SOLN COMPARISON:  Multiple exams, including radiographs from 09/15/2020 FINDINGS: TENDONS Peroneal: Longitudinal split tear of the peroneus brevis adjacent to the lateral malleolus. Notable peroneus longus tendinopathy adjacent to the distal calcaneus. Posteromedial: Partial obscuration of the distal tibialis posterior tendon due to metal artifact from the fractured K-in the medial malleolus. Distal tibialis posterior tendinopathy. Anterior: Mild thickening and tendinopathy of the anterior tendons as they pass over the distal tibial rim fragment. Achilles: Mild distal Achilles tendinopathy. Plantar Fascia: Thickened medial band of the plantar fascia suggesting plantar fasciitis. LIGAMENTS Lateral: Nonvisualization of the lateral ligamentous complex due to metal artifact and severe deformity due to bony fragmentation and irregularity. Medial: Nonvisualization of the deltoid ligament components and spring ligament due to metal artifact and regional bony deformities. CARTILAGE Ankle Joint: Chronically fractured and flattened distal tibia with chronically fractured and flattened talus favoring Charcot arthropathy. Severe irregularity of cortical margins along the tibiotalar articulation with some accentuated T2 signal in this articulation. Chronic anterior tibial fragment measures  1.9 by 2.6 by 5.3 cm. Subtalar Joints/Sinus Tarsi: degenerative arthropathy in the subtalar joints with some mild sparing of the posterior facet. Bones: Severe tibiotalar arthropathy with chronic fractures and possible Charcot joint as noted above. Marrow edema in the distal tibia, talus, anterior calcaneus, and the navicular, the with an effusion of the talonavicular and calcaneal navicular articulations. The degree of edema and enhancement is proportionate to the severe arthropathy and does not necessarily implicate osteomyelitis. I do not see any draining sinus tract but if the patient does have such a tract based on physical inspection in the possibility of infection might be heightened. There is a fractured K-wire the medial malleolus. There is a fragment from a prior screw in the lateral malleolus. Dorsal midfoot spurring. There is degenerative arthropathy at the articulation between the lateral cuneiform and the third metatarsal dorsally as shown on image 22 of series 12. Other: Atrophic plantar musculature in the foot. Atrophic stents are digitorum brevis muscle. IMPRESSION: IMPRESSION 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 No compelling findings of osteomyelitis although there can sometimes be some overlap in the edema, enhancement, and fragmentation from Charcot arthropathy and infection; if the patient has other clinical signs of infection such as a draining sinus tract then the possibility of superimposed infection might be heightened. No drainable abscess identified. 2. There is a chronically fractured K-wire the medial malleolus. Metal screw fragment in the lateral malleolus.  3. Nonvisualization of the lateral ligamentous complex, deltoid ligament components, and spring ligament due to metal artifact and regional bony deformities. 4. Longitudinal split tear of the peroneus brevis adjacent to the lateral malleolus. Notable peroneus longus tendinopathy adjacent to the distal  calcaneus. 5. Mild distal Achilles tendinopathy. 6. Thickened medial band of the plantar fascia suggesting plantar fasciitis. 7. Atrophic plantar musculature in the foot. Electronically Signed   By: Van Clines M.D.   On: 09/16/2020 11:46     Melicia Esqueda T. [place]  If 7PM-7AM, please contact night-coverage [email or web address] 09/16/2020, 2:57 PM

## 2020-09-16 NOTE — Plan of Care (Signed)

## 2020-09-17 DIAGNOSIS — E876 Hypokalemia: Secondary | ICD-10-CM | POA: Diagnosis not present

## 2020-09-17 DIAGNOSIS — E669 Obesity, unspecified: Secondary | ICD-10-CM | POA: Diagnosis not present

## 2020-09-17 DIAGNOSIS — G061 Intraspinal abscess and granuloma: Secondary | ICD-10-CM | POA: Diagnosis not present

## 2020-09-17 DIAGNOSIS — I10 Essential (primary) hypertension: Secondary | ICD-10-CM | POA: Diagnosis not present

## 2020-09-17 DIAGNOSIS — E11621 Type 2 diabetes mellitus with foot ulcer: Secondary | ICD-10-CM | POA: Diagnosis not present

## 2020-09-17 DIAGNOSIS — G062 Extradural and subdural abscess, unspecified: Secondary | ICD-10-CM | POA: Diagnosis not present

## 2020-09-17 DIAGNOSIS — E871 Hypo-osmolality and hyponatremia: Secondary | ICD-10-CM | POA: Diagnosis not present

## 2020-09-17 LAB — GLUCOSE, CAPILLARY
Glucose-Capillary: 181 mg/dL — ABNORMAL HIGH (ref 70–99)
Glucose-Capillary: 189 mg/dL — ABNORMAL HIGH (ref 70–99)
Glucose-Capillary: 183 mg/dL — ABNORMAL HIGH (ref 70–99)
Glucose-Capillary: 181 mg/dL — ABNORMAL HIGH (ref 70–99)

## 2020-09-17 LAB — VANCOMYCIN, TROUGH: Vancomycin Tr: 17 ug/mL (ref 15–20)

## 2020-09-17 MED ORDER — PENICILLIN G POTASSIUM 20000000 UNITS IJ SOLR
12.0000 10*6.[IU] | Freq: Two times a day (BID) | INTRAVENOUS | Status: DC
  Administered 2020-09-17 – 2020-09-24 (×14): 12 10*6.[IU] via INTRAVENOUS
  Filled 2020-09-17 (×6): qty 12
  Filled 2020-09-17: qty 5
  Filled 2020-09-17 (×10): qty 12

## 2020-09-17 NOTE — Progress Notes (Signed)
PROGRESS NOTE  Brittany Silva LTR:[id number] DOB: [birth year]   PCP: Carol Ada, MD  Patient is from: Home  DOA: 09/11/2020 LOS: 5  Chief complaints: Back pain  Brief Narrative / Interim history: 64 year old female with history of chronic back pain, DM-2, HTN, hypothyroidism, morbid obesity and recent UTI presenting with 5 days of lower back pain, bilateral leg pain, nausea, vomiting, fever and chills.  In ED, MRI lumbar spine concerning for lumbar epidural abscess around the L5-S1 disc space and soft tissue enhancement on the right posterior to the L4-5 and L5-S1 facet joints.  Neurosurgery consulted, and recommended transfer to [facility].  Concerned that a diabetic foot ulcer in her left fourth toe could be source of infection. She also has hardwares in her left foot.  The next day, evaluated by neurosurgery who recommended IR biopsy for tissue diagnosis and ID consult for antibiotics. Patient underwent fluoroscopy guided needle aspiration.  Tissue culture sent.  Started on IV vancomycin, cefepime and Flagyl which was deescalated to vancomycin and ceftriaxone, the to IV penicillin.  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   Subjective: Seen and examined earlier this morning.  No major events overnight or this morning.  Pain well controlled today.  She rates her pain 1/10.  No complaints.  Objective: Vitals:   09/16/20 1914 09/17/20 0313 09/17/20 0741 09/17/20 1049  BP: (!) 156/77 (!) 154/68 (!) 100/45 (!) 100/45  Pulse: 88 74 89   Resp: _0 Temp: 98.4 F (36.9 C) 97.9 F (36.6 C) 97.8 F (36.6 C)   TempSrc: Oral Oral Oral   SpO2: 96% 93% 93%   Weight:      Height:       No intake or output data in the 24 hours ending 09/17/20 1214 Filed Weights   09/13/20 0739  Weight: 129.3 kg    Examination:  GENERAL: No apparent distress.  Nontoxic. HEENT: MMM.  Vision and hearing grossly intact.  NECK: Supple.  No apparent JVD.    RESP:  No IWOB.  Fair aeration bilaterally. CVS:  RRR. Heart sounds normal.  ABD/GI/GU: BS+. Abd soft, NTND.  MSK/EXT:  Moves extremities. No apparent deformity. No edema.  SKIN: Small dry and pale looking left 4th toe diabetic ulcer NEURO: Awake, alert and oriented appropriately.  Motor 4/5 in BLE. PSYCH: Calm. Normal affect.    Procedures:  10/14-fluoroscopy guided needle aspiration of facet joints  Microbiology summarized: COVID-19 PCR negative.  Assessment & Plan: Acute on chronic low back pain/radiculopathy L5-S1 epidural abscess-no signs of cauda equina L4-5 and L5-S1 facet joint infection.  Left foot pain with left 4th to diabetic wound -Imaging as above.  TTE without significant finding.  CRP 23>> 17.  ESR 85>> 77.  Mild leukocytosis. -Neurosurgery consulted and recommended IR biopsy and ID consult for antibiotics. -Underwent fluoroscopy guided needle aspiration of the facet joints. -Vancomycin, cefepime and Flagyl 10/14-10/15 -Vancomycin and ceftriaxone 10/15>> 10/17 -Surgical tissue cultures grew few Streptococcus mitis/oralis sensitive to penicillin -IV penicillin G 10/17>> may need PICC line -Pain control scheduled Tylenol and tramadol, and IV Dilaudid for breakthrough pain -Bowel regimen with as needed MiraLAX and Senokot-S -Lower extremity ABI -PT/OT eval   Uncontrolled DM-2 with hypo- and hyperglycemia: On Levemir 75 units twice daily and SSI.  A1c 8.6%. Recent Labs  Lab 09/16/20 1302 09/16/20 1549 09/16/20 2022 09/17/20 0640 09/17/20 1204  GLUCAP 174* 165* 106* 181* 189*  -Continue SSI-high, NovoLog AC 12 units and Levemir 25  units twice daily -Continue atorvastatin  Essential hypertension: Soft blood pressures today. -Continue home amlodipine -Continue holding losartan  Hypothyroidism -Continue home Synthroid  Hypokalemia-resolved.  Hyponatremia: Na 132.  Stable. -Recheck.  Acute cystitis-treated with IV ceftriaxone x1 and Keflex x3  days -Should be covered with antibiotics as above.  Morbid obesity Body mass index is 44.64 kg/m. Nutrition Problem: Increased nutrient needs Etiology: acute illness Signs/Symptoms: estimated needs Interventions: MVI, Premier Protein   DVT prophylaxis:  Place and maintain sequential compression device Start: 09/12/20 1806  Code Status: Full code Family Communication: Patient and/or RN. Available if any question.  Status is: Inpatient  Remains inpatient appropriate because:Ongoing diagnostic testing needed not appropriate for outpatient work up, IV treatments appropriate due to intensity of illness or inability to take PO and Inpatient level of care appropriate due to severity of illness   Dispo: The patient is from: Home              Anticipated d/c is to: Home              Anticipated d/c date is: 2 days once cleared by ID.              Patient currently is not medically stable to d/c.       Consultants:  Neurosurgery-signed off. IR-off. Infectious disease   Sch Meds:  Scheduled Meds: . acetaminophen  1,000 mg Oral Q8H  . amLODipine  10 mg Oral Daily  . atorvastatin  40 mg Oral Daily  . DULoxetine  90 mg Oral Daily  . enoxaparin (LOVENOX) injection  65 mg Subcutaneous Q24H  . insulin aspart  0-20 Units Subcutaneous TID WC  . insulin aspart  0-5 Units Subcutaneous QHS  . insulin aspart  12 Units Subcutaneous TID WC  . insulin detemir  25 Units Subcutaneous BID  . levothyroxine  75 mcg Oral Q0600  . multivitamin with minerals  1 tablet Oral Daily  . Ensure Max Protein  11 oz Oral QHS  . traMADol  50 mg Oral Q8H   Continuous Infusions: . penicillin g continuous IV infusion     PRN Meds:.hydrALAZINE, HYDROmorphone (DILAUDID) injection, ondansetron **OR** ondansetron (ZOFRAN) IV, polyethylene glycol, senna-docusate  Antimicrobials: Anti-infectives (From admission, onward)   Start     Dose/Rate Route Frequency Ordered Stop   09/17/20 1600  penicillin G  potassium 12 Million Units in dextrose 5 % 500 mL continuous infusion        12 Million Units 41.7 mL/hr over 12 Hours Intravenous Every 12 hours 09/17/20 0959     09/15/20 1500  cefTRIAXone (ROCEPHIN) 2 g in sodium chloride 0.9 % 100 mL IVPB  Status:  Discontinued        2 g 200 mL/hr over 30 Minutes Intravenous Every 24 hours 09/15/20 1049 09/17/20 0959   09/15/20 0400  vancomycin (VANCOREADY) IVPB 1250 mg/250 mL  Status:  Discontinued        1,250 mg 166.7 mL/hr over 90 Minutes Intravenous Every 12 hours 09/14/20 1416 09/17/20 0959   09/14/20 1500  vancomycin (VANCOCIN) 2,500 mg in sodium chloride 0.9 % 500 mL IVPB        2,500 mg 250 mL/hr over 120 Minutes Intravenous  Once 09/14/20 1410 09/14/20 1901   09/14/20 1430  metroNIDAZOLE (FLAGYL) IVPB 500 mg  Status:  Discontinued        500 mg 100 mL/hr over 60 Minutes Intravenous Every 8 hours 09/14/20 1358 09/15/20 1343   09/14/20 1430  ceFEPIme (MAXIPIME) 2  g in sodium chloride 0.9 % 100 mL IVPB  Status:  Discontinued        2 g 200 mL/hr over 30 Minutes Intravenous Every 8 hours 09/14/20 1410 09/15/20 1049       I have personally reviewed the following labs and images: CBC: Recent Labs  Lab 09/12/20 1811 09/13/20 1525 09/14/20 0106 09/15/20 0322 09/16/20 0155  WBC 11.7* 9.7 10.8* 11.1* 11.5*  NEUTROABS  --  7.7 8.6*  --  9.1*  HGB 13.5 12.3 12.3 11.4* 11.7*  HCT 40.5 36.5 36.9 35.0* 35.9*  MCV 88.2 85.9 86.2 86.6 89.1  PLT 320 378 417* 413* 407*   BMP &GFR Recent Labs  Lab 09/11/20 2239 09/12/20 1811 09/13/20 1525 09/14/20 0106 09/15/20 0322 09/16/20 0155  NA 134*  --  131*  --  131* 132*  K 3.1*  --  4.1  --  3.9 3.6  CL 97*  --  95*  --  97* 97*  CO2 28  --  24  --  23 26  GLUCOSE 76  --  219*  --  279* 230*  BUN 17  --  8  --  15 16  CREATININE 0.89 0.68 0.72  --  0.77 0.74  CALCIUM 8.1*  --  8.1*  --  8.0* 8.4*  MG  --  2.4 2.1 2.1 2.3 2.1  PHOS  --   --  2.1*  --  3.3 3.5   Estimated Creatinine  Clearance: 99.5 mL/min (by C-G formula based on SCr of 0.74 mg/dL). Liver & Pancreas: Recent Labs  Lab 09/11/20 2359 09/13/20 1525 09/15/20 0322 09/16/20 0155  AST 37  --   --   --   ALT 22  --   --   --   ALKPHOS 128*  --   --   --   BILITOT 0.6  --   --   --   PROT 6.9  --   --   --   ALBUMIN 2.8* 2.4* 2.3* 2.2*   No results for input(s): LIPASE, AMYLASE in the last 168 hours. No results for input(s): AMMONIA in the last 168 hours. Diabetic: No results for input(s): HGBA1C in the last 72 hours. Recent Labs  Lab 09/16/20 1302 09/16/20 1549 09/16/20 2022 09/17/20 0640 09/17/20 1204  GLUCAP 174* 165* 106* 181* 189*   Cardiac Enzymes: No results for input(s): CKTOTAL, CKMB, CKMBINDEX, TROPONINI in the last 168 hours. No results for input(s): PROBNP in the last 8760 hours. Coagulation Profile: Recent Labs  Lab 09/14/20 0106  INR 1.1   Thyroid Function Tests: No results for input(s): TSH, T4TOTAL, FREET4, T3FREE, THYROIDAB in the last 72 hours. Lipid Profile: No results for input(s): CHOL, HDL, LDLCALC, TRIG, CHOLHDL, LDLDIRECT in the last 72 hours. Anemia Panel: No results for input(s): VITAMINB12, FOLATE, FERRITIN, TIBC, IRON, RETICCTPCT in the last 72 hours. Urine analysis:    Component Value Date/Time   COLORURINE YELLOW 09/12/2020 0528   APPEARANCEUR CLEAR 09/12/2020 0528   LABSPEC 1.022 09/12/2020 0528   PHURINE 5.0 09/12/2020 0528   GLUCOSEU >=500 (A) 09/12/2020 0528   HGBUR NEGATIVE 09/12/2020 0528   HGBUR trace-lysed 05/16/2008 0849   BILIRUBINUR NEGATIVE 09/12/2020 0528   KETONESUR NEGATIVE 09/12/2020 0528   PROTEINUR NEGATIVE 09/12/2020 0528   UROBILINOGEN 0.2 05/16/2008 0849   NITRITE NEGATIVE 09/12/2020 0528   LEUKOCYTESUR NEGATIVE 09/12/2020 0528   Sepsis Labs: Invalid input(s): PROCALCITONIN, [place]  Microbiology: Recent Results (from the past 240 hour(s))  Urine culture  Status: Abnormal   Collection Time: 09/08/20 10:30 PM    Specimen: Urine, Random  Result Value Ref Range Status   Specimen Description   Final    URINE, RANDOM Performed at [facility], [address] [place], [place] [address]    Special Requests   Final    NONE Performed at [facility], [address] [place], [place] [address]    Culture 80,000 COLONIES/mL ESCHERICHIA COLI (A)  Final   Report Status 09/11/2020 FINAL  Final   Organism ID, Bacteria ESCHERICHIA COLI (A)  Final      Susceptibility   Escherichia coli - MIC*    AMPICILLIN >=32 RESISTANT Resistant     CEFAZOLIN <=4 SENSITIVE Sensitive     CEFTRIAXONE <=0.25 SENSITIVE Sensitive     CIPROFLOXACIN <=0.25 SENSITIVE Sensitive     GENTAMICIN <=1 SENSITIVE Sensitive     IMIPENEM <=0.25 SENSITIVE Sensitive     NITROFURANTOIN <=16 SENSITIVE Sensitive     TRIMETH/SULFA <=20 SENSITIVE Sensitive     AMPICILLIN/SULBACTAM 8 SENSITIVE Sensitive     PIP/TAZO <=4 SENSITIVE Sensitive     * 80,000 COLONIES/mL ESCHERICHIA COLI  Respiratory Panel by RT PCR (Flu A&B, Covid) - Nasopharyngeal Swab     Status: None   Collection Time: 09/12/20  1:43 PM   Specimen: Nasopharyngeal Swab  Result Value Ref Range Status   SARS Coronavirus 2 by RT PCR NEGATIVE NEGATIVE Final    Comment: (NOTE) SARS-CoV-2 target nucleic acids are NOT DETECTED.  The SARS-CoV-2 RNA is generally detectable in upper respiratoy specimens during the acute phase of infection. The lowest concentration of SARS-CoV-2 viral copies this assay can detect is 131 copies/mL. A negative result does not preclude SARS-Cov-2 infection and should not be used as the sole basis for treatment or other patient management decisions. A negative result may occur with  improper specimen collection/handling, submission of specimen other than nasopharyngeal swab, presence of viral mutation(s) within the areas targeted by this assay, and inadequate number of viral copies (<131 copies/mL). A negative result must be combined  with clinical observations, patient history, and epidemiological information. The expected result is Negative.  Fact Sheet for Patients:  PinkCheek.be  Fact Sheet for Healthcare Providers:  GravelBags.it  This test is no t yet approved or cleared by the [place] FDA and  has been authorized for detection and/or diagnosis of SARS-CoV-2 by FDA under an Emergency Use Authorization (EUA). This EUA will remain  in effect (meaning this test can be used) for the duration of the COVID-19 declaration under Section 564(b)(1) of the Act, 21 U.S.C. section 360bbb-3(b)(1), unless the authorization is terminated or revoked sooner.     Influenza A by PCR NEGATIVE NEGATIVE Final   Influenza B by PCR NEGATIVE NEGATIVE Final    Comment: (NOTE) The Xpert Xpress SARS-CoV-2/FLU/RSV assay is intended as an aid in  the diagnosis of influenza from Nasopharyngeal swab specimens and  should not be used as a sole basis for treatment. Nasal washings and  aspirates are unacceptable for Xpert Xpress SARS-CoV-2/FLU/RSV  testing.  Fact Sheet for Patients: PinkCheek.be  Fact Sheet for Healthcare Providers: GravelBags.it  This test is not yet approved or cleared by the [place] FDA and  has been authorized for detection and/or diagnosis of SARS-CoV-2 by  FDA under an Emergency Use Authorization (EUA). This EUA will remain  in effect (meaning this test can be used) for the duration of the  Covid-19 declaration under Section  564(b)(1) of the Act, 21  U.S.C. section 360bbb-3(b)(1), unless the authorization is  terminated or revoked. Performed at [facility], [place] [address], [place], [place] [address]   Aerobic/Anaerobic Culture (surgical/deep wound)     Status: None (Preliminary result)   Collection Time: 09/14/20  1:52 PM   Specimen: Abscess; Synovial Fluid   Result Value Ref Range Status   Specimen Description ABSCESS  Final   Special Requests RIGHT L4 L5 JOINT  Final   Gram Stain   Final    ABUNDANT WBC PRESENT, PREDOMINANTLY PMN MODERATE GRAM POSITIVE COCCI IN PAIRS IN CHAINS Performed at [facility], [address] [place], [place] [address]    Culture   Final    FEW STREPTOCOCCUS MITIS/ORALIS NO ANAEROBES ISOLATED; CULTURE IN PROGRESS FOR 5 DAYS    Report Status PENDING  Incomplete   Organism ID, Bacteria STREPTOCOCCUS MITIS/ORALIS  Final      Susceptibility   Streptococcus mitis/oralis - MIC*    TETRACYCLINE 0.5 SENSITIVE Sensitive     VANCOMYCIN 0.5 SENSITIVE Sensitive     CLINDAMYCIN 0.5 INTERMEDIATE Intermediate     PENICILLIN Value in next row Sensitive      SENSITIVE0.06    CEFTRIAXONE Value in next row Sensitive      SENSITIVE0.12    * FEW STREPTOCOCCUS MITIS/ORALIS    Radiology Studies: No results found.   Daishaun Ayre T. Turpin  If 7PM-7AM, please contact night-coverage [email or web address] 09/17/2020, 12:14 PM

## 2020-09-17 NOTE — Plan of Care (Signed)

## 2020-09-17 NOTE — Progress Notes (Signed)
[facility]  Date of Admission:  09/11/2020     Total days of antibiotics: 4        Current antibiotics: vancomycin, ceftriaxone  Reason for visit: Follow up on vertebral infection, foot ulcer  Assessment: Brittany Silva is a 64 y.o. female.  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  Status post IR guided aspiration with Strep mitis/oralis.  Currently on ceftriaxone and vancomycin.  MRI of foot without compelling evidence of osteomyelitis.  ID issues: # Epidural abscess, facet joint infection # Diabetic foot wound  Other issues: # Diabetes # HTN # Obesity  Recommendations: --Stop ceftriaxone --Stop vancomycin --Start penicillin --Would consider vascular studies such as ABI --Pain control per primary   Scheduled Meds: . acetaminophen  1,000 mg Oral Q8H  . amLODipine  10 mg Oral Daily  . atorvastatin  40 mg Oral Daily  . DULoxetine  90 mg Oral Daily  . enoxaparin (LOVENOX) injection  65 mg Subcutaneous Q24H  . insulin aspart  0-20 Units Subcutaneous TID WC  . insulin aspart  0-5 Units Subcutaneous QHS  . insulin aspart  12 Units Subcutaneous TID WC  . insulin detemir  25 Units Subcutaneous BID  . levothyroxine  75 mcg Oral Q0600  . multivitamin with minerals  1 tablet Oral Daily  . Ensure Max Protein  11 oz Oral QHS  . traMADol  50 mg Oral Q8H   Continuous Infusions: . cefTRIAXone (ROCEPHIN)  IV 2 g (09/16/20 1457)  . vancomycin 1,250 mg (09/17/20 0427)   PRN Meds:.hydrALAZINE, HYDROmorphone (DILAUDID) injection, ondansetron **OR** ondansetron (ZOFRAN) IV, polyethylene glycol, senna-docusate  Last 24-Hour Events: MRI was completed last night.  No compelling findings of osteomyelitis although not definitively ruled out.  Seems more c/w Charcot Cultures with Streptococcus mitis ESR 77, CRP 16 Afebrile No new labs this morning Vancomycin and ceftriaxone continued  Subjective: She  feels better today.  Her pain is improved.  She reports hx of prior vascular studies several years ago.  Allergies  Allergen Reactions  . Hydrochlorothiazide     rash  . Lisinopril     Other reaction(s): Cough  . Adhesive [Tape]   . Codeine Phosphate     REACTION: throat swelling  . Darvon Other (See Comments)    Jittering, skin problems  . Darvon [Propoxyphene Hcl]     Hyperactivity; "makes me crazy"  . Hydrocodone-Acetaminophen Itching    *Has tolerated hydromorphone as an adult* Per pt, when she was a child had throat itching, difficulty breathing and feel strange and also vomit   . Propoxyphene Hcl     REACTION: Makes her crazy  . Latex Rash  . Neosporin [Neomycin-Bacitracin Zn-Polymyx] Rash  . Sulfamethoxazole Itching and Rash     Review of Systems: Review of Systems  Constitutional: Negative for chills and fever.  Respiratory: Negative.   Cardiovascular: Negative.   Musculoskeletal: Positive for back pain and joint pain.       Improved pain today.  Skin: Negative.   All other systems reviewed and are negative.    Objective: Blood pressure (!) 100/45, pulse 89, temperature 97.8 F (36.6 C), temperature source Oral, resp. rate 17, height '5\' 7"'  (1.702 m), weight 129.3 kg, SpO2 93 %. Body mass index is 44.64 kg/m.  Physical Exam Constitutional:      General: She is not in acute distress.    Appearance: Normal appearance.  HENT:  Head: Normocephalic and atraumatic.  Pulmonary:     Effort: Pulmonary effort is normal. No respiratory distress.  Musculoskeletal:     Comments: Left 4th toe with ulcer. No erythema or drainage.   Skin:    General: Skin is warm and dry.  Neurological:     General: No focal deficit present.     Mental Status: She is alert and oriented to person, place, and time.  Psychiatric:        Mood and Affect: Mood normal.        Behavior: Behavior normal.       Lab Results: Lab Results  Component Value Date   WBC 11.5 (H)  09/16/2020   HGB 11.7 (L) 09/16/2020   HCT 35.9 (L) 09/16/2020   MCV 89.1 09/16/2020   PLT 407 (H) 09/16/2020    Lab Results  Component Value Date   CREATININE 0.74 09/16/2020   BUN 16 09/16/2020   NA 132 (L) 09/16/2020   K 3.6 09/16/2020   CL 97 (L) 09/16/2020   CO2 26 09/16/2020    Lab Results  Component Value Date   ALT 22 09/11/2020   AST 37 09/11/2020   ALKPHOS 128 (H) 09/11/2020     Microbiology: Recent Results (from the past 240 hour(s))  Urine culture     Status: Abnormal   Collection Time: 09/08/20 10:30 PM   Specimen: Urine, Random  Result Value Ref Range Status   Specimen Description   Final    URINE, RANDOM Performed at [facility], [address] [place], [place] [address]    Special Requests   Final    NONE Performed at [facility], [address] [place], [place] [address]    Culture 80,000 COLONIES/mL ESCHERICHIA COLI (A)  Final   Report Status 09/11/2020 FINAL  Final   Organism ID, Bacteria ESCHERICHIA COLI (A)  Final      Susceptibility   Escherichia coli - MIC*    AMPICILLIN >=32 RESISTANT Resistant     CEFAZOLIN <=4 SENSITIVE Sensitive     CEFTRIAXONE <=0.25 SENSITIVE Sensitive     CIPROFLOXACIN <=0.25 SENSITIVE Sensitive     GENTAMICIN <=1 SENSITIVE Sensitive     IMIPENEM <=0.25 SENSITIVE Sensitive     NITROFURANTOIN <=16 SENSITIVE Sensitive     TRIMETH/SULFA <=20 SENSITIVE Sensitive     AMPICILLIN/SULBACTAM 8 SENSITIVE Sensitive     PIP/TAZO <=4 SENSITIVE Sensitive     * 80,000 COLONIES/mL ESCHERICHIA COLI  Respiratory Panel by RT PCR (Flu A&B, Covid) - Nasopharyngeal Swab     Status: None   Collection Time: 09/12/20  1:43 PM   Specimen: Nasopharyngeal Swab  Result Value Ref Range Status   SARS Coronavirus 2 by RT PCR NEGATIVE NEGATIVE Final    Comment: (NOTE) SARS-CoV-2 target nucleic acids are NOT DETECTED.  The SARS-CoV-2 RNA is generally detectable in upper respiratoy specimens during the acute phase of  infection. The lowest concentration of SARS-CoV-2 viral copies this assay can detect is 131 copies/mL. A negative result does not preclude SARS-Cov-2 infection and should not be used as the sole basis for treatment or other patient management decisions. A negative result may occur with  improper specimen collection/handling, submission of specimen other than nasopharyngeal swab, presence of viral mutation(s) within the areas targeted by this assay, and inadequate number of viral copies (<131 copies/mL). A negative result must be combined with clinical observations, patient history, and epidemiological information. The expected result is Negative.  Fact  Sheet for Patients:  PinkCheek.be  Fact Sheet for Healthcare Providers:  GravelBags.it  This test is no t yet approved or cleared by the [place] FDA and  has been authorized for detection and/or diagnosis of SARS-CoV-2 by FDA under an Emergency Use Authorization (EUA). This EUA will remain  in effect (meaning this test can be used) for the duration of the COVID-19 declaration under Section 564(b)(1) of the Act, 21 U.S.C. section 360bbb-3(b)(1), unless the authorization is terminated or revoked sooner.     Influenza A by PCR NEGATIVE NEGATIVE Final   Influenza B by PCR NEGATIVE NEGATIVE Final    Comment: (NOTE) The Xpert Xpress SARS-CoV-2/FLU/RSV assay is intended as an aid in  the diagnosis of influenza from Nasopharyngeal swab specimens and  should not be used as a sole basis for treatment. Nasal washings and  aspirates are unacceptable for Xpert Xpress SARS-CoV-2/FLU/RSV  testing.  Fact Sheet for Patients: PinkCheek.be  Fact Sheet for Healthcare Providers: GravelBags.it  This test is not yet approved or cleared by the [place] FDA and  has been authorized for detection and/or diagnosis of SARS-CoV-2  by  FDA under an Emergency Use Authorization (EUA). This EUA will remain  in effect (meaning this test can be used) for the duration of the  Covid-19 declaration under Section 564(b)(1) of the Act, 21  U.S.C. section 360bbb-3(b)(1), unless the authorization is  terminated or revoked. Performed at [facility], [place] [address] [place], [place] [address]   Aerobic/Anaerobic Culture (surgical/deep wound)     Status: None (Preliminary result)   Collection Time: 09/14/20  1:52 PM   Specimen: Abscess; Synovial Fluid  Result Value Ref Range Status   Specimen Description ABSCESS  Final   Special Requests RIGHT L4 L5 JOINT  Final   Gram Stain   Final    ABUNDANT WBC PRESENT, PREDOMINANTLY PMN MODERATE GRAM POSITIVE COCCI IN PAIRS IN CHAINS Performed at [facility], [address], [place], [place] [address]    Culture   Final    FEW STREPTOCOCCUS MITIS/ORALIS NO ANAEROBES ISOLATED; CULTURE IN PROGRESS FOR 5 DAYS    Report Status PENDING  Incomplete   Organism ID, Bacteria STREPTOCOCCUS MITIS/ORALIS  Final      Susceptibility   Streptococcus mitis/oralis - MIC*    TETRACYCLINE 0.5 SENSITIVE Sensitive     VANCOMYCIN 0.5 SENSITIVE Sensitive     CLINDAMYCIN 0.5 INTERMEDIATE Intermediate     PENICILLIN Value in next row Sensitive      SENSITIVE0.06    CEFTRIAXONE Value in next row Sensitive      SENSITIVE0.12    * FEW STREPTOCOCCUS MITIS/ORALIS    I have reviewed the micro and lab results.  Imaging: DG Ankle 2 Views Left  Result Date: 09/15/2020 CLINICAL DATA:  Chronic left ankle pain. EXAM: LEFT ANKLE - 2 VIEW COMPARISON:  Left foot x-rays dated Apr 05, 2014. FINDINGS: No acute fracture or dislocation. Chronic fracture of the medial malleolar pin. Abandoned screw in the lateral malleolus. Severe tibiotalar osteoarthritis with joint space narrowing and marked bony hypertrophy. Mild dorsal degenerative spurring of the second TMT joint. Bone mineralization is  normal. Soft tissues are unremarkable. IMPRESSION: 1. Severe post-traumatic tibiotalar osteoarthritis. Electronically Signed   By: Titus Dubin M.D.   On: 09/15/2020 12:45   MR FOOT LEFT W WO CONTRAST  Result Date: 09/16/2020 CLINICAL DATA:  Diabetes, foot swelling, possible osteomyelitis. EXAM: MRI OF THE LEFT FOREFOOT WITHOUT AND WITH CONTRAST TECHNIQUE: Multiplanar, multisequence  MR imaging of the ankle was performed before and after the administration of intravenous contrast. CONTRAST:  55m GADAVIST GADOBUTROL 1 MMOL/ML IV SOLN COMPARISON:  Multiple exams, including radiographs from 09/15/2020 FINDINGS: TENDONS Peroneal: Longitudinal split tear of the peroneus brevis adjacent to the lateral malleolus. Notable peroneus longus tendinopathy adjacent to the distal calcaneus. Posteromedial: Partial obscuration of the distal tibialis posterior tendon due to metal artifact from the fractured K-in the medial malleolus. Distal tibialis posterior tendinopathy. Anterior: Mild thickening and tendinopathy of the anterior tendons as they pass over the distal tibial rim fragment. Achilles: Mild distal Achilles tendinopathy. Plantar Fascia: Thickened medial band of the plantar fascia suggesting plantar fasciitis. LIGAMENTS Lateral: Nonvisualization of the lateral ligamentous complex due to metal artifact and severe deformity due to bony fragmentation and irregularity. Medial: Nonvisualization of the deltoid ligament components and spring ligament due to metal artifact and regional bony deformities. CARTILAGE Ankle Joint: Chronically fractured and flattened distal tibia with chronically fractured and flattened talus favoring Charcot arthropathy. Severe irregularity of cortical margins along the tibiotalar articulation with some accentuated T2 signal in this articulation. Chronic anterior tibial fragment measures 1.9 by 2.6 by 5.3 cm. Subtalar Joints/Sinus Tarsi: degenerative arthropathy in the subtalar joints with some  mild sparing of the posterior facet. Bones: Severe tibiotalar arthropathy with chronic fractures and possible Charcot joint as noted above. Marrow edema in the distal tibia, talus, anterior calcaneus, and the navicular, the with an effusion of the talonavicular and calcaneal navicular articulations. The degree of edema and enhancement is proportionate to the severe arthropathy and does not necessarily implicate osteomyelitis. I do not see any draining sinus tract but if the patient does have such a tract based on physical inspection in the possibility of infection might be heightened. There is a fractured K-wire the medial malleolus. There is a fragment from a prior screw in the lateral malleolus. Dorsal midfoot spurring. There is degenerative arthropathy at the articulation between the lateral cuneiform and the third metatarsal dorsally as shown on image 22 of series 12. Other: Atrophic plantar musculature in the foot. Atrophic stents are digitorum brevis muscle. IMPRESSION: IMPRESSION 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 No compelling findings of osteomyelitis although there can sometimes be some overlap in the edema, enhancement, and fragmentation from Charcot arthropathy and infection; if the patient has other clinical signs of infection such as a draining sinus tract then the possibility of superimposed infection might be heightened. No drainable abscess identified. 2. There is a chronically fractured K-wire the medial malleolus. Metal screw fragment in the lateral malleolus. 3. Nonvisualization of the lateral ligamentous complex, deltoid ligament components, and spring ligament due to metal artifact and regional bony deformities. 4. Longitudinal split tear of the peroneus brevis adjacent to the lateral malleolus. Notable peroneus longus tendinopathy adjacent to the distal calcaneus. 5. Mild distal Achilles tendinopathy. 6. Thickened medial band of the plantar fascia suggesting  plantar fasciitis. 7. Atrophic plantar musculature in the foot. Electronically Signed   By: WVan ClinesM.D.   On: 09/16/2020 11:46   ECHOCARDIOGRAM COMPLETE  Result Date: 09/15/2020    ECHOCARDIOGRAM REPORT   Patient Name:   TCYDNEY ALVARENGADate of Exam: 09/15/2020 Medical Rec #:  [id number]       Height:       67.0 in Accession #:    [id number]      Weight:       285.0 lb Date of Birth:  703-05-1956  BSA:          2.351 m Patient Age:    43 years         BP:           141/75 mmHg Patient Gender: F                HR:           80 bpm. Exam Location:  Inpatient Procedure: 2D Echo Indications:    Fever 780.6 / R50.9  History:        Patient has no prior history of Echocardiogram examinations.                 Risk Factors:Diabetes and Hypertension. History of chronic back                 pain, thyroid disease. Presenting with back pain, bilateral leg                 pain, nausea, vomiting, fever and chills. Acute cystitis.                 Epidural abscess.  Sonographer:    [name] RDCS Referring Phys: [place]  1. Left ventricular ejection fraction, by estimation, is 60 to 65%. The left ventricle has normal function. The left ventricle has no regional wall motion abnormalities. There is mild left ventricular hypertrophy. Left ventricular diastolic parameters were normal.  2. Right ventricular systolic function is normal. The right ventricular size is normal. Tricuspid regurgitation signal is inadequate for assessing PA pressure.  3. The mitral valve is normal in structure. Trivial mitral valve regurgitation. No evidence of mitral stenosis.  4. The aortic valve is grossly normal. Unable to determine aortic valve morphology due to image quality. Aortic valve regurgitation is not visualized. No aortic stenosis is present. Conclusion(s)/Recommendation(s): No evidence of valvular vegetations on this transthoracic echocardiogram. Would recommend a transesophageal  echocardiogram to exclude infective endocarditis if clinically indicated. FINDINGS  Left Ventricle: Left ventricular ejection fraction, by estimation, is 60 to 65%. The left ventricle has normal function. The left ventricle has no regional wall motion abnormalities. The left ventricular internal cavity size was normal in size. There is  mild left ventricular hypertrophy. Left ventricular diastolic parameters were normal. Right Ventricle: The right ventricular size is normal. No increase in right ventricular wall thickness. Right ventricular systolic function is normal. Tricuspid regurgitation signal is inadequate for assessing PA pressure. Left Atrium: Left atrial size was normal in size. Right Atrium: Right atrial size was normal in size. Pericardium: There is no evidence of pericardial effusion. Mitral Valve: The mitral valve is normal in structure. Mild mitral annular calcification. Trivial mitral valve regurgitation. No evidence of mitral valve stenosis. Tricuspid Valve: The tricuspid valve is normal in structure. Tricuspid valve regurgitation is trivial. No evidence of tricuspid stenosis. Aortic Valve: The aortic valve is grossly normal. Aortic valve regurgitation is not visualized. No aortic stenosis is present. Pulmonic Valve: The pulmonic valve was normal in structure. Pulmonic valve regurgitation is trivial. No evidence of pulmonic stenosis. Aorta: The aortic root is normal in size and structure. Venous: The inferior vena cava was not well visualized. IAS/Shunts: The interatrial septum was not well visualized.  LEFT VENTRICLE PLAX 2D LVIDd:         4.30 cm  Diastology LVIDs:         3.20 cm  LV e' medial:    5.68 cm/s LV PW:  1.10 cm  LV E/e' medial:  15.8 LV IVS:        1.30 cm  LV e' lateral:   7.22 cm/s LVOT diam:     2.10 cm  LV E/e' lateral: 12.4 LV SV:         61 LV SV Index:   26 LVOT Area:     3.46 cm  RIGHT VENTRICLE RV S prime:     13.10 cm/s TAPSE (M-mode): 2.4 cm LEFT ATRIUM              Index       RIGHT ATRIUM           Index LA diam:        4.40 cm 1.87 cm/m  RA Area:     13.70 cm LA Vol (A2C):   34.5 ml 14.68 ml/m RA Volume:   30.00 ml  12.76 ml/m LA Vol (A4C):   44.0 ml 18.72 ml/m LA Biplane Vol: 39.3 ml 16.72 ml/m  AORTIC VALVE LVOT Vmax:   85.50 cm/s LVOT Vmean:  57.400 cm/s LVOT VTI:    0.177 m  AORTA Ao Root diam: 3.00 cm MITRAL VALVE MV Area (PHT): 4.60 cm    SHUNTS MV Decel Time: 165 msec    Systemic VTI:  0.18 m MV E velocity: 89.50 cm/s  Systemic Diam: 2.10 cm MV A velocity: 77.60 cm/s MV E/A ratio:  1.15 Cherlynn Kaiser MD Electronically signed by Cherlynn Kaiser MD Signature Date/Time: 09/15/2020/3:50:04 PM    Final      [facility] [place] [phone] pager 09/17/2020, 9:40 AM

## 2020-09-17 NOTE — Progress Notes (Signed)
Pharmacy Antibiotic Note  Brittany Silva is a 64 y.o. female admitted on 09/11/2020 with epidural abscess.  Pharmacy has been consulted for vancomycin dosing. Also on ceftriaxone per MD. ID concerned patient previously bacteremic without bcx drawn in ED; TTE negative for vegetations. Abscess culture growing few Strep mitis/oralis today pending susceptibilities.  ID ordered MRI L foot given hx hardware and concern for osteo - imaging negative for osteo/abscess, more consistent with charcot arthropathy. CRP and ESR downtrending at 16.7 and 77 respectively yesterday.  Vanc given 1700 last night; trough drawn early at 0300. Estimated true trough ~15 is therapeutic. Continue current dosing with likely deescalation soon pending final sensitivities.  Plan: Continue vancomycin 1243m q12hr (Goal VT 15-20) Ceftriaxone per MD Monitor cultures, clinical status, renal fx Narrow abx as able and f/u duration    Height: _0  (170.2 cm) Weight: 129.3 kg (285 lb) IBW/kg (Calculated) : 61.6  Temp (24hrs), Avg:98 F (36.7 C), Min:97.7 F (36.5 C), Max:98.4 F (36.9 C)  Recent Labs  Lab 09/11/20 2239 09/11/20 2239 09/12/20 1811 09/13/20 1525 09/14/20 0106 09/15/20 0322 09/16/20 0155 09/17/20 0252  WBC 10.7*   < > 11.7* 9.7 10.8* 11.1* 11.5*  --   CREATININE 0.89  --  0.68 0.72  --  0.77 0.74  --   VANCOTROUGH  --   --   --   --   --   --   --  17   < > = values in this interval not displayed.    Estimated Creatinine Clearance: 99.5 mL/min (by C-G formula based on SCr of 0.74 mg/dL).    Allergies  Allergen Reactions  . Hydrochlorothiazide     rash  . Lisinopril     Other reaction(s): Cough  . Adhesive [Tape]   . Codeine Phosphate     REACTION: throat swelling  . Darvon Other (See Comments)    Jittering, skin problems  . Darvon [Propoxyphene Hcl]     Hyperactivity; "makes me crazy"  . Hydrocodone-Acetaminophen Itching    *Has tolerated hydromorphone as an adult* Per pt, when she  was a child had throat itching, difficulty breathing and feel strange and also vomit   . Propoxyphene Hcl     REACTION: Makes her crazy  . Latex Rash  . Neosporin [Neomycin-Bacitracin Zn-Polymyx] Rash  . Sulfamethoxazole Itching and Rash    Antimicrobials this admission: Vanc 10/14>>  Cefe 10/14>>1015 MTZ 10/14>>10/15 Ctx 10/15 >>  Microbiology results: 10/14 abscess aspiration: S. Mitis/oralis  Thank you for allowing pharmacy to be a part of this patient's care.  AAlfonse Spruce PharmD PGY2 ID Pharmacy Resident Phone between 7 am - 3:30 pm: [phone] Please check AMION for all [facility] numbers After 10:00 PM, call [phone]

## 2020-09-17 NOTE — Progress Notes (Signed)
Mostly has been declining to wear rhe SCD's

## 2020-09-18 ENCOUNTER — Inpatient Hospital Stay (HOSPITAL_COMMUNITY): Payer: 59

## 2020-09-18 DIAGNOSIS — I10 Essential (primary) hypertension: Secondary | ICD-10-CM | POA: Diagnosis not present

## 2020-09-18 DIAGNOSIS — L0390 Cellulitis, unspecified: Secondary | ICD-10-CM | POA: Diagnosis not present

## 2020-09-18 DIAGNOSIS — E871 Hypo-osmolality and hyponatremia: Secondary | ICD-10-CM | POA: Diagnosis not present

## 2020-09-18 DIAGNOSIS — G061 Intraspinal abscess and granuloma: Secondary | ICD-10-CM | POA: Diagnosis not present

## 2020-09-18 DIAGNOSIS — E876 Hypokalemia: Secondary | ICD-10-CM | POA: Diagnosis not present

## 2020-09-18 LAB — RENAL FUNCTION PANEL
Albumin: 2.3 g/dL — ABNORMAL LOW (ref 3.5–5.0)
Anion gap: 11 (ref 5–15)
BUN: 12 mg/dL (ref 8–23)
CO2: 24 mmol/L (ref 22–32)
Calcium: 8.4 mg/dL — ABNORMAL LOW (ref 8.9–10.3)
Chloride: 97 mmol/L — ABNORMAL LOW (ref 98–111)
Creatinine, Ser: 0.63 mg/dL (ref 0.44–1.00)
GFR, Estimated: >60 mL/min (ref >60)
Glucose, Bld: 353 mg/dL — ABNORMAL HIGH (ref 70–99)
Phosphorus: 2.9 mg/dL (ref 2.5–4.6)
Potassium: 4.1 mmol/L (ref 3.5–5.1)
Sodium: 132 mmol/L — ABNORMAL LOW (ref 135–145)

## 2020-09-18 LAB — GLUCOSE, CAPILLARY
Glucose-Capillary: 281 mg/dL — ABNORMAL HIGH (ref 70–99)
Glucose-Capillary: 227 mg/dL — ABNORMAL HIGH (ref 70–99)
Glucose-Capillary: 153 mg/dL — ABNORMAL HIGH (ref 70–99)
Glucose-Capillary: 140 mg/dL — ABNORMAL HIGH (ref 70–99)

## 2020-09-18 LAB — CBC
HCT: 36.5 % (ref 36.0–46.0)
Hemoglobin: 12.1 g/dL (ref 12.0–15.0)
MCH: 29.1 pg (ref 26.0–34.0)
MCHC: 33.2 g/dL (ref 30.0–36.0)
MCV: 87.7 fL (ref 80.0–100.0)
Platelets: 425 10*3/uL — ABNORMAL HIGH (ref 150–400)
RBC: 4.16 MIL/uL (ref 3.87–5.11)
RDW: 13.7 % (ref 11.5–15.5)
WBC: 11.3 10*3/uL — ABNORMAL HIGH (ref 4.0–10.5)
nRBC: 0 % (ref 0.0–0.2)

## 2020-09-18 LAB — MAGNESIUM: Magnesium: 2 mg/dL (ref 1.7–2.4)

## 2020-09-18 MED ORDER — INSULIN ASPART 100 UNIT/ML ~~LOC~~ SOLN
15.0000 [IU] | Freq: Three times a day (TID) | SUBCUTANEOUS | Status: DC
  Administered 2020-09-18 – 2020-09-24 (×13): 15 [IU] via SUBCUTANEOUS

## 2020-09-18 MED ORDER — INSULIN DETEMIR 100 UNIT/ML ~~LOC~~ SOLN
35.0000 [IU] | Freq: Two times a day (BID) | SUBCUTANEOUS | Status: DC
  Administered 2020-09-18 – 2020-09-19 (×3): 35 [IU] via SUBCUTANEOUS
  Filled 2020-09-18 (×4): qty 0.35

## 2020-09-18 NOTE — Plan of Care (Signed)

## 2020-09-18 NOTE — Progress Notes (Signed)
PROGRESS NOTE  Brittany Silva:[id number] DOB: 05-Jul-1956   PCP: Carol Ada, MD  Patient is from: Home  DOA: 09/11/2020 LOS: 6  Chief complaints: Back pain  Brief Narrative / Interim history: 64 year old female with history of chronic back pain, DM-2, HTN, hypothyroidism, morbid obesity and recent UTI presenting with 5 days of lower back pain, bilateral leg pain, nausea, vomiting, fever and chills.  In ED, MRI lumbar spine concerning for lumbar epidural abscess around the L5-S1 disc space and soft tissue enhancement on the right posterior to the L4-5 and L5-S1 facet joints.  Neurosurgery consulted, and recommended transfer to [facility].  Concerned that a diabetic foot ulcer in her left fourth toe could be source of infection. She also has hardwares in her left foot.  The next day, evaluated by neurosurgery who recommended IR biopsy for tissue diagnosis and ID consult for antibiotics. Patient underwent fluoroscopy guided needle aspiration.  Tissue culture sent.  Started on IV vancomycin, cefepime and Flagyl which was deescalated to vancomycin and ceftriaxone, the to IV penicillin G.  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   Subjective: Seen and examined earlier this morning.  No major events overnight of this morning.  She has some lower back pain last night but improved this morning.  She rates her pain 2/10 this morning.  Denies chest pain, dyspnea or new focal neuro symptoms.  Denies UTI or GI symptoms.  Objective: Vitals:   09/17/20 1614 09/17/20 2005 09/18/20 0416 09/18/20 0811  BP: 140/77 (!) 146/50 (!) 177/71 125/64  Pulse: 90 72 76 86  Resp: _0 Temp: 97.8 F (36.6 C) (!) 97.5 F (36.4 C) 98.1 F (36.7 C) 97.7 F (36.5 C)  TempSrc: Oral Oral Oral Oral  SpO2: 94% 98% 92% 98%  Weight:      Height:        Intake/Output Summary (Last 24 hours) at 09/18/2020 1426 Last data filed at 09/18/2020 1000 Gross per 24 hour    Intake 807.12 ml  Output --  Net 807.12 ml   Filed Weights   09/13/20 0739  Weight: 129.3 kg    Examination:  GENERAL: No apparent distress.  Nontoxic. HEENT: MMM.  Vision and hearing grossly intact.  NECK: Supple.  No apparent JVD.  RESP:  No IWOB.  Fair aeration bilaterally. CVS:  RRR. Heart sounds normal.  Faint DP pulses. ABD/GI/GU: BS+. Abd soft, NTND.  MSK/EXT:  Moves extremities. No apparent deformity. No edema.  SKIN: Small dry and pale looking left fourth toe diabetic ulcer NEURO: Awake, alert and oriented appropriately.  No apparent focal neuro deficit. PSYCH: Calm. Normal affect.    Procedures:  10/14-fluoroscopy guided needle aspiration of facet joints  Microbiology summarized: COVID-19 PCR negative.  Assessment & Plan: Acute on chronic low back pain/radiculopathy L5-S1 epidural abscess-no signs of cauda equina L4-5 and L5-S1 facet joint infection.  Left foot pain with left 4th to diabetic wound -Imaging as above.  TTE without significant finding.  CRP 23>> 17.  ESR 85>> 77.  Mild leukocytosis. -Neurosurgery consulted and recommended IR biopsy and ID consult for antibiotics. -Underwent fluoroscopy guided needle aspiration of the facet joints. -Vancomycin, cefepime and Flagyl 10/14-10/15 -Vancomycin and ceftriaxone 10/15>> 10/17 -Surgical tissue cultures grew few Streptococcus mitis/oralis sensitive to penicillin -IV penicillin G 10/17>> may need PICC line.  ID to decide timing. -Pain control scheduled Tylenol and tramadol, and IV Dilaudid for breakthrough pain -Bowel regimen with as needed MiraLAX and Senokot-S -  Follow lower extremity ABI -PT/OT eval   Uncontrolled DM-2 with hypo- and hyperglycemia: On Levemir 75 units twice daily and SSI.  A1c 8.6%. Recent Labs  Lab 09/17/20 1204 09/17/20 1717 09/17/20 2135 09/18/20 0657 09/18/20 1143  GLUCAP 189* 183* 181* 281* 227*  -Continue SSI-high.  -Increase NovoLog AC from 12 to 15 units -Increase  Levemir from 25 to 35 units twice daily -Continue atorvastatin  Essential hypertension: Normotensive. -Continue home amlodipine -Continue holding losartan  Hypothyroidism -Continue home Synthroid  Hypokalemia-resolved.  Hyponatremia: Na 132.  Stable. -Recheck.  Acute cystitis-treated with IV ceftriaxone x1 and Keflex x3 days -Should be covered with antibiotics as above.  Morbid obesity Body mass index is 44.64 kg/m. Nutrition Problem: Increased nutrient needs Etiology: acute illness Signs/Symptoms: estimated needs Interventions: MVI, Premier Protein   DVT prophylaxis:  Place and maintain sequential compression device Start: 09/12/20 1806  Code Status: Full code Family Communication: Patient and/or RN. Available if any question.  Status is: Inpatient  Remains inpatient appropriate because:Ongoing diagnostic testing needed not appropriate for outpatient work up, IV treatments appropriate due to intensity of illness or inability to take PO and Inpatient level of care appropriate due to severity of illness   Dispo: The patient is from: Home              Anticipated d/c is to: Home              Anticipated d/c date is: 2 days once cleared by ID.              Patient currently is not medically stable to d/c.       Consultants:  Neurosurgery-signed off. IR-off. Infectious disease   Sch Meds:  Scheduled Meds:  acetaminophen  1,000 mg Oral Q8H   amLODipine  10 mg Oral Daily   atorvastatin  40 mg Oral Daily   DULoxetine  90 mg Oral Daily   enoxaparin (LOVENOX) injection  65 mg Subcutaneous Q24H   insulin aspart  0-20 Units Subcutaneous TID WC   insulin aspart  0-5 Units Subcutaneous QHS   insulin aspart  12 Units Subcutaneous TID WC   insulin detemir  35 Units Subcutaneous BID   levothyroxine  75 mcg Oral Q0600   multivitamin with minerals  1 tablet Oral Daily   Ensure Max Protein  11 oz Oral QHS   traMADol  50 mg Oral Q8H   Continuous  Infusions:  penicillin g continuous IV infusion Stopped (09/18/20 0509)   PRN Meds:.hydrALAZINE, HYDROmorphone (DILAUDID) injection, ondansetron **OR** ondansetron (ZOFRAN) IV, polyethylene glycol, senna-docusate  Antimicrobials: Anti-infectives (From admission, onward)   Start     Dose/Rate Route Frequency Ordered Stop   09/17/20 1600  penicillin G potassium 12 Million Units in dextrose 5 % 500 mL continuous infusion        12 Million Units 41.7 mL/hr over 12 Hours Intravenous Every 12 hours 09/17/20 0959     09/15/20 1500  cefTRIAXone (ROCEPHIN) 2 g in sodium chloride 0.9 % 100 mL IVPB  Status:  Discontinued        2 g 200 mL/hr over 30 Minutes Intravenous Every 24 hours 09/15/20 1049 09/17/20 0959   09/15/20 0400  vancomycin (VANCOREADY) IVPB 1250 mg/250 mL  Status:  Discontinued        1,250 mg 166.7 mL/hr over 90 Minutes Intravenous Every 12 hours 09/14/20 1416 09/17/20 0959   09/14/20 1500  vancomycin (VANCOCIN) 2,500 mg in sodium chloride 0.9 % 500 mL IVPB  2,500 mg 250 mL/hr over 120 Minutes Intravenous  Once 09/14/20 1410 09/14/20 1901   09/14/20 1430  metroNIDAZOLE (FLAGYL) IVPB 500 mg  Status:  Discontinued        500 mg 100 mL/hr over 60 Minutes Intravenous Every 8 hours 09/14/20 1358 09/15/20 1343   09/14/20 1430  ceFEPIme (MAXIPIME) 2 g in sodium chloride 0.9 % 100 mL IVPB  Status:  Discontinued        2 g 200 mL/hr over 30 Minutes Intravenous Every 8 hours 09/14/20 1410 09/15/20 1049       I have personally reviewed the following labs and images: CBC: Recent Labs  Lab 09/13/20 1525 09/14/20 0106 09/15/20 0322 09/16/20 0155 09/18/20 0359  WBC 9.7 10.8* 11.1* 11.5* 11.3*  NEUTROABS 7.7 8.6*  --  9.1*  --   HGB 12.3 12.3 11.4* 11.7* 12.1  HCT 36.5 36.9 35.0* 35.9* 36.5  MCV 85.9 86.2 86.6 89.1 87.7  PLT 378 417* 413* 407* 425*   BMP &GFR Recent Labs  Lab 09/11/20 2239 09/11/20 2239 09/12/20 1811 09/12/20 1811 09/13/20 1525 09/14/20 0106  09/15/20 0322 09/16/20 0155 09/18/20 0359  NA 134*  --   --   --  131*  --  131* 132* 132*  K 3.1*  --   --   --  4.1  --  3.9 3.6 4.1  CL 97*  --   --   --  95*  --  97* 97* 97*  CO2 28  --   --   --  24  --  _0 GLUCOSE 76  --   --   --  219*  --  279* 230* 353*  BUN 17  --   --   --  8  --  _1 CREATININE 0.89   < > 0.68  --  0.72  --  0.77 0.74 0.63  CALCIUM 8.1*  --   --   --  8.1*  --  8.0* 8.4* 8.4*  MG  --   --  2.4   < > 2.1 2.1 2.3 2.1 2.0  PHOS  --   --   --   --  2.1*  --  3.3 3.5 2.9   < > = values in this interval not displayed.   Estimated Creatinine Clearance: 99.5 mL/min (by C-G formula based on SCr of 0.63 mg/dL). Liver & Pancreas: Recent Labs  Lab 09/11/20 2359 09/13/20 1525 09/15/20 0322 09/16/20 0155 09/18/20 0359  AST 37  --   --   --   --   ALT 22  --   --   --   --   ALKPHOS 128*  --   --   --   --   BILITOT 0.6  --   --   --   --   PROT 6.9  --   --   --   --   ALBUMIN 2.8* 2.4* 2.3* 2.2* 2.3*   No results for input(s): LIPASE, AMYLASE in the last 168 hours. No results for input(s): AMMONIA in the last 168 hours. Diabetic: No results for input(s): HGBA1C in the last 72 hours. Recent Labs  Lab 09/17/20 1204 09/17/20 1717 09/17/20 2135 09/18/20 0657 09/18/20 1143  GLUCAP 189* 183* 181* 281* 227*   Cardiac Enzymes: No results for input(s): CKTOTAL, CKMB, CKMBINDEX, TROPONINI in the last 168 hours. No results for input(s): PROBNP in the last 8760 hours. Coagulation Profile: Recent Labs  Lab  09/14/20 0106  INR 1.1   Thyroid Function Tests: No results for input(s): TSH, T4TOTAL, FREET4, T3FREE, THYROIDAB in the last 72 hours. Lipid Profile: No results for input(s): CHOL, HDL, LDLCALC, TRIG, CHOLHDL, LDLDIRECT in the last 72 hours. Anemia Panel: No results for input(s): VITAMINB12, FOLATE, FERRITIN, TIBC, IRON, RETICCTPCT in the last 72 hours. Urine analysis:    Component Value Date/Time   COLORURINE YELLOW 09/12/2020 0528    APPEARANCEUR CLEAR 09/12/2020 0528   LABSPEC 1.022 09/12/2020 0528   PHURINE 5.0 09/12/2020 0528   GLUCOSEU >=500 (A) 09/12/2020 0528   HGBUR NEGATIVE 09/12/2020 0528   HGBUR trace-lysed 05/16/2008 0849   BILIRUBINUR NEGATIVE 09/12/2020 0528   KETONESUR NEGATIVE 09/12/2020 0528   PROTEINUR NEGATIVE 09/12/2020 0528   UROBILINOGEN 0.2 05/16/2008 0849   NITRITE NEGATIVE 09/12/2020 0528   LEUKOCYTESUR NEGATIVE 09/12/2020 0528   Sepsis Labs: Invalid input(s): PROCALCITONIN, [place]  Microbiology: Recent Results (from the past 240 hour(s))  Urine culture     Status: Abnormal   Collection Time: 09/08/20 10:30 PM   Specimen: Urine, Random  Result Value Ref Range Status   Specimen Description   Final    URINE, RANDOM Performed at [facility], [address] [place], [place] [address]    Special Requests   Final    NONE Performed at [facility], [address] [place], [place] [address]    Culture 80,000 COLONIES/mL ESCHERICHIA COLI (A)  Final   Report Status 09/11/2020 FINAL  Final   Organism ID, Bacteria ESCHERICHIA COLI (A)  Final      Susceptibility   Escherichia coli - MIC*    AMPICILLIN >=32 RESISTANT Resistant     CEFAZOLIN <=4 SENSITIVE Sensitive     CEFTRIAXONE <=0.25 SENSITIVE Sensitive     CIPROFLOXACIN <=0.25 SENSITIVE Sensitive     GENTAMICIN <=1 SENSITIVE Sensitive     IMIPENEM <=0.25 SENSITIVE Sensitive     NITROFURANTOIN <=16 SENSITIVE Sensitive     TRIMETH/SULFA <=20 SENSITIVE Sensitive     AMPICILLIN/SULBACTAM 8 SENSITIVE Sensitive     PIP/TAZO <=4 SENSITIVE Sensitive     * 80,000 COLONIES/mL ESCHERICHIA COLI  Respiratory Panel by RT PCR (Flu A&B, Covid) - Nasopharyngeal Swab     Status: None   Collection Time: 09/12/20  1:43 PM   Specimen: Nasopharyngeal Swab  Result Value Ref Range Status   SARS Coronavirus 2 by RT PCR NEGATIVE NEGATIVE Final    Comment: (NOTE) SARS-CoV-2 target nucleic acids are NOT DETECTED.  The  SARS-CoV-2 RNA is generally detectable in upper respiratoy specimens during the acute phase of infection. The lowest concentration of SARS-CoV-2 viral copies this assay can detect is 131 copies/mL. A negative result does not preclude SARS-Cov-2 infection and should not be used as the sole basis for treatment or other patient management decisions. A negative result may occur with  improper specimen collection/handling, submission of specimen other than nasopharyngeal swab, presence of viral mutation(s) within the areas targeted by this assay, and inadequate number of viral copies (<131 copies/mL). A negative result must be combined with clinical observations, patient history, and epidemiological information. The expected result is Negative.  Fact Sheet for Patients:  PinkCheek.be  Fact Sheet for Healthcare Providers:  GravelBags.it  This test is no t yet approved or cleared by the [place] FDA and  has been authorized for detection and/or diagnosis of SARS-CoV-2 by FDA under an Emergency Use Authorization (EUA). This EUA will remain  in effect (meaning this  test can be used) for the duration of the COVID-19 declaration under Section 564(b)(1) of the Act, 21 U.S.C. section 360bbb-3(b)(1), unless the authorization is terminated or revoked sooner.     Influenza A by PCR NEGATIVE NEGATIVE Final   Influenza B by PCR NEGATIVE NEGATIVE Final    Comment: (NOTE) The Xpert Xpress SARS-CoV-2/FLU/RSV assay is intended as an aid in  the diagnosis of influenza from Nasopharyngeal swab specimens and  should not be used as a sole basis for treatment. Nasal washings and  aspirates are unacceptable for Xpert Xpress SARS-CoV-2/FLU/RSV  testing.  Fact Sheet for Patients: PinkCheek.be  Fact Sheet for Healthcare Providers: GravelBags.it  This test is not yet approved or cleared  by the [place] FDA and  has been authorized for detection and/or diagnosis of SARS-CoV-2 by  FDA under an Emergency Use Authorization (EUA). This EUA will remain  in effect (meaning this test can be used) for the duration of the  Covid-19 declaration under Section 564(b)(1) of the Act, 21  U.S.C. section 360bbb-3(b)(1), unless the authorization is  terminated or revoked. Performed at [facility], [place] [address], [place], [place] [address]   Aerobic/Anaerobic Culture (surgical/deep wound)     Status: None (Preliminary result)   Collection Time: 09/14/20  1:52 PM   Specimen: Abscess; Synovial Fluid  Result Value Ref Range Status   Specimen Description ABSCESS  Final   Special Requests RIGHT L4 L5 JOINT  Final   Gram Stain   Final    ABUNDANT WBC PRESENT, PREDOMINANTLY PMN MODERATE GRAM POSITIVE COCCI IN PAIRS IN CHAINS Performed at [facility], [address], [place],  [address]    Culture   Final    FEW STREPTOCOCCUS MITIS/ORALIS NO ANAEROBES ISOLATED; CULTURE IN PROGRESS FOR 5 DAYS    Report Status PENDING  Incomplete   Organism ID, Bacteria STREPTOCOCCUS MITIS/ORALIS  Final      Susceptibility   Streptococcus mitis/oralis - MIC*    TETRACYCLINE 0.5 SENSITIVE Sensitive     VANCOMYCIN 0.5 SENSITIVE Sensitive     CLINDAMYCIN 0.5 INTERMEDIATE Intermediate     PENICILLIN Value in next row Sensitive      SENSITIVE0.06    CEFTRIAXONE Value in next row Sensitive      SENSITIVE0.12    * FEW STREPTOCOCCUS MITIS/ORALIS    Radiology Studies: VAS [place] ABI WITH/WO TBI  Result Date: 09/18/2020 LOWER EXTREMITY DOPPLER STUDY Indications: Ulceration. High Risk Factors: Hypertension, Diabetes.  Limitations: Today's exam was limited due to irregular heart rhythm. Performing Technologist: Sharion Dove RVS  Examination Guidelines: A complete evaluation includes at minimum, Doppler waveform signals and systolic blood pressure reading at the level of  bilateral brachial, anterior tibial, and posterior tibial arteries, when vessel segments are accessible. Bilateral testing is considered an integral part of a complete examination. Photoelectric Plethysmograph (PPG) waveforms and toe systolic pressure readings are included as required and additional duplex testing as needed. Limited examinations for reoccurring indications may be performed as noted.  ABI Findings: +---------+------------------+-----+-----------+--------+  Right     Rt Pressure (mmHg) Index Waveform    Comment   +---------+------------------+-----+-----------+--------+  Brachial  178                      multiphasic           +---------+------------------+-----+-----------+--------+  PTA       182                1.02  multiphasic           +---------+------------------+-----+-----------+--------+  DP        178                1.00  multiphasic           +---------+------------------+-----+-----------+--------+  Great Toe 104                0.58                        +---------+------------------+-----+-----------+--------+ +---------+------------------+-----+-----------+-------+  Left      Lt Pressure (mmHg) Index Waveform    Comment  +---------+------------------+-----+-----------+-------+  Brachial  178                      multiphasic          +---------+------------------+-----+-----------+-------+  PTA       160                0.90  multiphasic          +---------+------------------+-----+-----------+-------+  DP        163                0.92  multiphasic          +---------+------------------+-----+-----------+-------+  Great Toe 89                 0.50                       +---------+------------------+-----+-----------+-------+ +-------+-----------+-----------+------------+------------+  ABI/TBI Today's ABI Today's TBI Previous ABI Previous TBI  +-------+-----------+-----------+------------+------------+  Right   1.02        0.58                                    +-------+-----------+-----------+------------+------------+  Left    0.92        0.50                                   +-------+-----------+-----------+------------+------------+  Summary: Right: Resting right ankle-brachial index is within normal range. No evidence of significant right lower extremity arterial disease. The right toe-brachial index is abnormal. Left: Resting left ankle-brachial index is within normal range. No evidence of significant left lower extremity arterial disease. The left toe-brachial index is abnormal.  *See table(s) above for measurements and observations.    Preliminary      Lucious Zou T. [place]  If 7PM-7AM, please contact night-coverage [email or web address] 09/18/2020, 2:26 PM

## 2020-09-18 NOTE — Plan of Care (Signed)

## 2020-09-18 NOTE — Progress Notes (Signed)
Physical Therapy Treatment Patient Details Name: Brittany Silva MRN: [id number] DOB: November 04, 1956 Today's Date: 09/18/2020    History of Present Illness Brittany Silva is a 64 y.o. female with medical history significant of DM2, HTN, hypothyroidism. Presenting with 5 days of back/leg pain, N/V. Presented to ED, diagnosed with UTI and sent home with antibiotics. Symptoms did not improve, so pt returned back to ED. MRI showed possible epidural abscess. Pt admitted and underwent L4-L5 facet aspiration    PT Comments    Pt limited due to severe RLE and lower back pain, deferred EOB/OOB mobility but agreeable to bed-level session. Pt performed supine BLE A/AAROM therapeutic exercises with good tolerance as detailed below, pt given HEP handout to perform TID as able ([email or web address] Access Code: [email or web address]). RN notified pt requesting pain meds at end of session, heels floated for comfort. Pt continues to benefit from PT services to progress toward functional mobility goals. D/C recs below remain appropriate, pending progress.  Follow Up Recommendations  Home health PT;Supervision/Assistance - 24 hour     Equipment Recommendations  3in1 (PT)    Recommendations for Other Services       Precautions / Restrictions Precautions Precautions: Fall Precaution Comments: back pain Restrictions Weight Bearing Restrictions: No    Mobility  Bed Mobility Overal bed mobility: Needs Assistance             General bed mobility comments: supine scoot toward HOB with min guard and cues; pt deferred EOB/OOB mobility  Transfers                    Ambulation/Gait                 Stairs             Wheelchair Mobility    Modified Rankin (Stroke Patients Only)       Balance                                            Cognition Arousal/Alertness: Awake/alert Behavior During Therapy: WFL for tasks assessed/performed Overall Cognitive  Status: Within Functional Limits for tasks assessed                                        Exercises General Exercises - Lower Extremity Ankle Circles/Pumps: AROM;Both;10 reps;Supine Quad Sets: AROM;Both;10 reps;Supine Gluteal Sets: AROM;10 reps;Supine Short Arc Quad: AROM;Both;10 reps;Supine Heel Slides: AROM;Left;10 reps;Supine (unable on R 2/2 pain) Hip ABduction/ADduction: AROM;Both;10 reps;Supine Straight Leg Raises: AAROM;Left;10 reps;Supine (unable on R 2/2 pain) Other Exercises Other Exercises: arm raises thumbs up with deep breaths    General Comments        Pertinent Vitals/Pain Pain Assessment: 0-10 Pain Score: 8  Pain Location: low back and behind R knee Pain Descriptors / Indicators: Grimacing;Discomfort;Constant;Spasm Pain Intervention(s): Limited activity within patient's tolerance;Monitored during session;Repositioned;Patient requesting pain meds-RN notified   Vitals:   09/18/20 0811 09/18/20 1315  BP: 125/64 123/60  Pulse: 86 82  Resp: 16 17  Temp: 97.7 F (36.5 C) 98 F (36.7 C)  SpO2: 98% 99%    Home Living                      Prior Function  PT Goals (current goals can now be found in the care plan section) Acute Rehab PT Goals Patient Stated Goal: pain control, be able to take care of myself again and clean out my house PT Goal Formulation: With patient Time For Goal Achievement: 09/29/20 Potential to Achieve Goals: Good Progress towards PT goals: Progressing toward goals    Frequency    Min 3X/week      PT Plan Current plan remains appropriate    Co-evaluation              AM-PAC PT "6 Clicks" Mobility   Outcome Measure  Help needed turning from your back to your side while in a flat bed without using bedrails?: None Help needed moving from lying on your back to sitting on the side of a flat bed without using bedrails?: A Little Help needed moving to and from a bed to a chair (including  a wheelchair)?: A Little Help needed standing up from a chair using your arms (e.g., wheelchair or bedside chair)?: A Little Help needed to walk in hospital room?: A Little Help needed climbing 3-5 steps with a railing? : A Lot 6 Click Score: 18    End of Session   Activity Tolerance: Patient limited by pain Patient left: in bed;with bed alarm set;with call bell/phone within reach;with SCD's reapplied (heels floated) Nurse Communication: Mobility status;Patient requests pain meds PT Visit Diagnosis: Unsteadiness on feet (R26.81);Muscle weakness (generalized) (M62.81);Difficulty in walking, not elsewhere classified (R26.2);Pain Pain - Right/Left: Right Pain - part of body: Leg (and back)     Time: 8295-6213 PT Time Calculation (min) (ACUTE ONLY): 23 min  Charges:  $Therapeutic Exercise: 8-22 mins                     Brigitta Pricer P., PTA Acute Rehabilitation Services Pager: [phone] Office: [place] 09/18/2020, 4:43 PM

## 2020-09-18 NOTE — Progress Notes (Signed)
VASCULAR LAB    ABIs have been performed.  See CV proc for preliminary results.   Valorie Mcgrory, RVT 09/18/2020, 10:10 AM

## 2020-09-19 ENCOUNTER — Inpatient Hospital Stay (HOSPITAL_COMMUNITY): Payer: 59

## 2020-09-19 ENCOUNTER — Inpatient Hospital Stay: Payer: Self-pay

## 2020-09-19 DIAGNOSIS — E876 Hypokalemia: Secondary | ICD-10-CM | POA: Diagnosis not present

## 2020-09-19 DIAGNOSIS — M25572 Pain in left ankle and joints of left foot: Secondary | ICD-10-CM

## 2020-09-19 DIAGNOSIS — E871 Hypo-osmolality and hyponatremia: Secondary | ICD-10-CM | POA: Diagnosis not present

## 2020-09-19 DIAGNOSIS — I10 Essential (primary) hypertension: Secondary | ICD-10-CM | POA: Diagnosis not present

## 2020-09-19 DIAGNOSIS — M25571 Pain in right ankle and joints of right foot: Secondary | ICD-10-CM

## 2020-09-19 DIAGNOSIS — G061 Intraspinal abscess and granuloma: Secondary | ICD-10-CM | POA: Diagnosis not present

## 2020-09-19 LAB — AEROBIC/ANAEROBIC CULTURE W GRAM STAIN (SURGICAL/DEEP WOUND)

## 2020-09-19 LAB — GLUCOSE, CAPILLARY
Glucose-Capillary: 159 mg/dL — ABNORMAL HIGH (ref 70–99)
Glucose-Capillary: 166 mg/dL — ABNORMAL HIGH (ref 70–99)
Glucose-Capillary: 176 mg/dL — ABNORMAL HIGH (ref 70–99)
Glucose-Capillary: 197 mg/dL — ABNORMAL HIGH (ref 70–99)
Glucose-Capillary: 283 mg/dL — ABNORMAL HIGH (ref 70–99)

## 2020-09-19 LAB — CREATININE, SERUM
Creatinine, Ser: 0.59 mg/dL (ref 0.44–1.00)
GFR, Estimated: >60 mL/min (ref >60)

## 2020-09-19 IMAGING — MR MR FOOT*L* WO/W CM
4 of 9 series · 18 of 40 positions shown · IV contrast (gadavist)
Comparison: X-ray [DATE]

CLINICAL DATA: Diabetic foot ulcer

EXAM:
MRI OF THE LEFT FOREFOOT WITHOUT AND WITH CONTRAST
TECHNIQUE: Multiplanar, multisequence MR imaging of the left forefoot was
performed both before and after administration of intravenous
contrast.
CONTRAST:  10mL GADAVIST GADOBUTROL 1 MMOL/ML IV SOLN

[Series 4: T1 · coronal · 5.0mm · 0.27mm/px · 6 of 30 slices shown (1 of 2)]
[im 1/30]
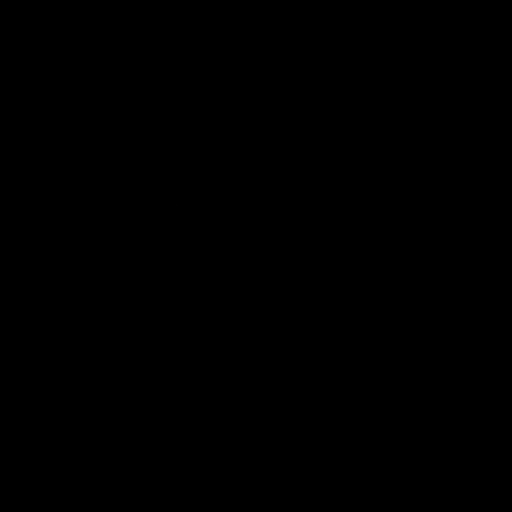
[im 6/30]
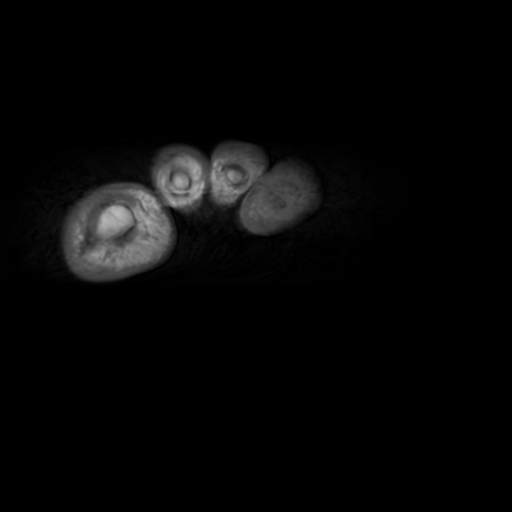
[im 12/30]
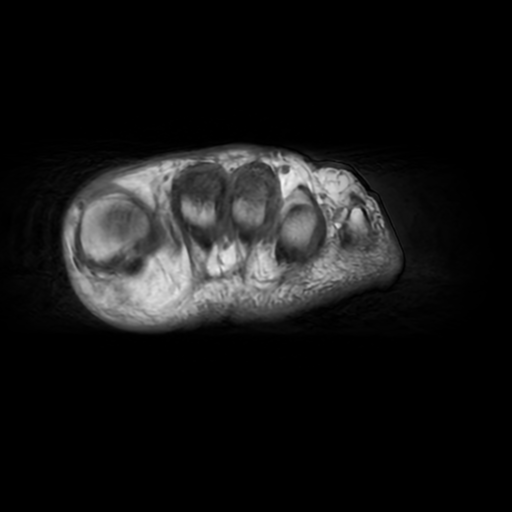
[im 18/30]
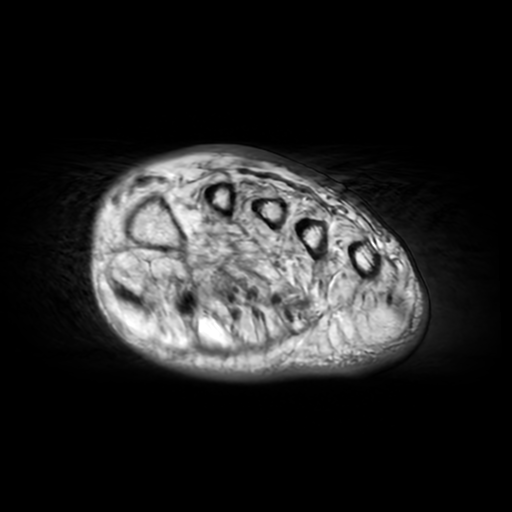
[im 24/30]
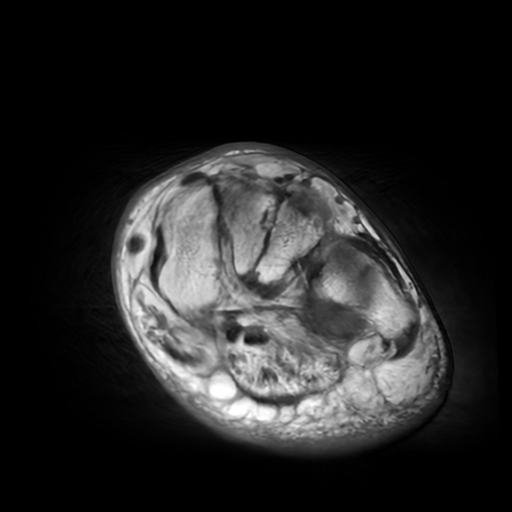
[im 30/30]
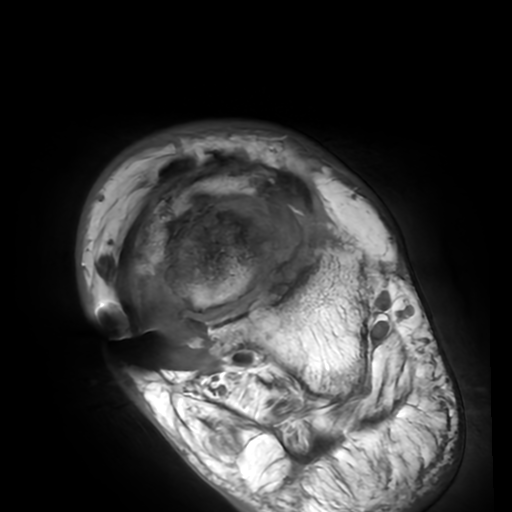

[Series 5: T1 fat-sat · coronal · non-contrast · 5.0mm · 0.27mm/px · 3 of 30 slices shown]
[im 6/30]
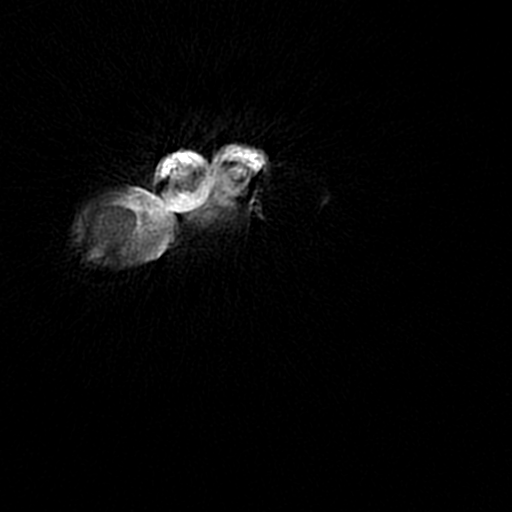
[im 18/30]
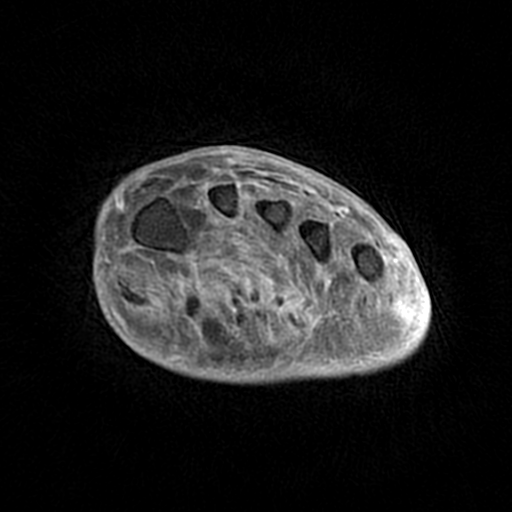
[im 30/30]
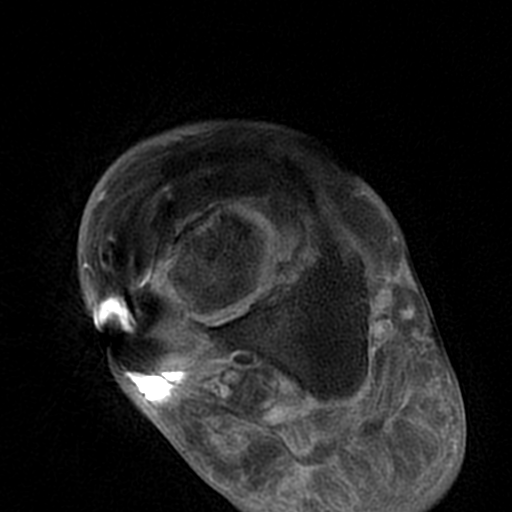

[Series 6: T2 fat-sat · coronal · 5.0mm · 0.27mm/px · 6 of 30 slices shown]
[im 1/30]
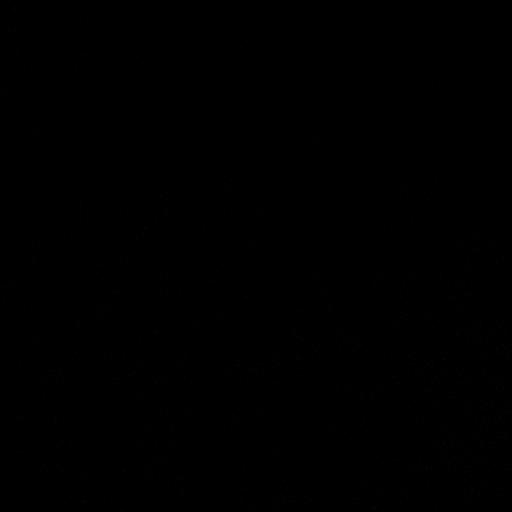
[im 6/30]
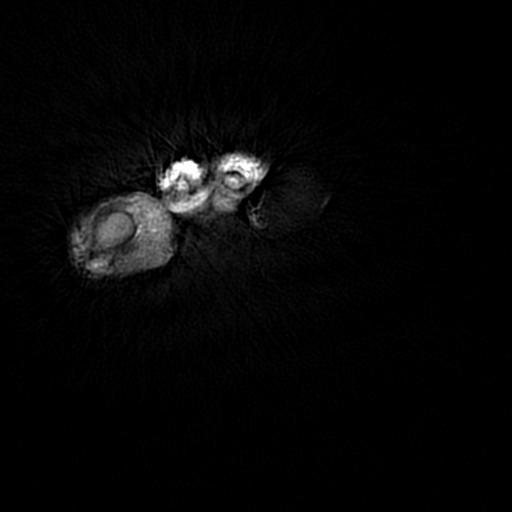
[im 12/30]
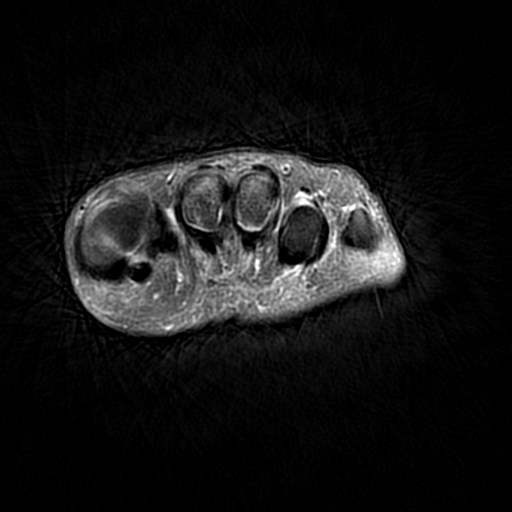
[im 18/30]
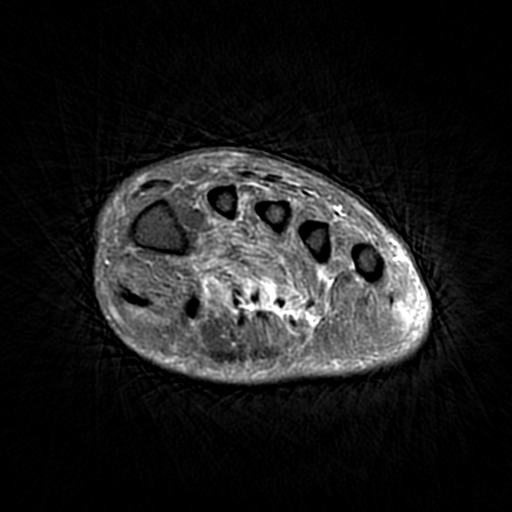
[im 24/30]
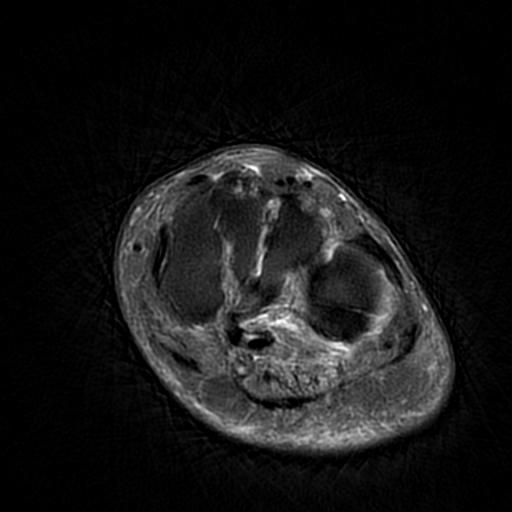
[im 30/30]
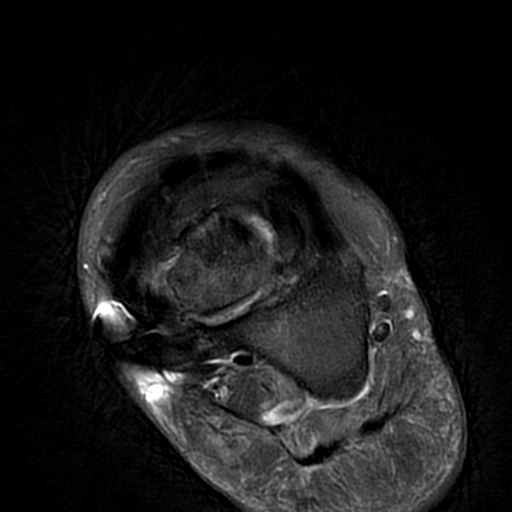

[Series 9: T1 · axial · 3.0mm · 0.35mm/px · z∈[-87,-25]mm · 3 of 20 slices shown (2 of 2)]
[im 1/20]
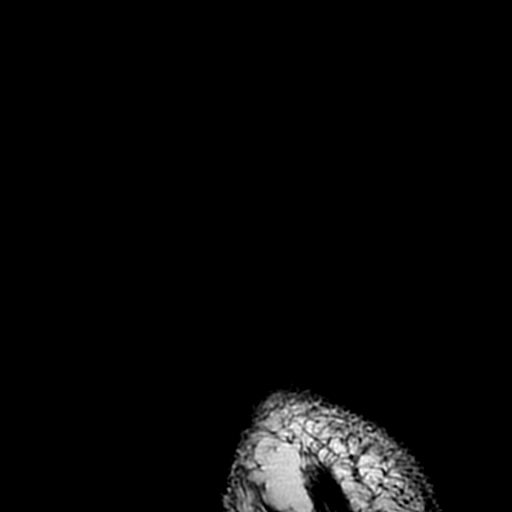
[im 10/20]
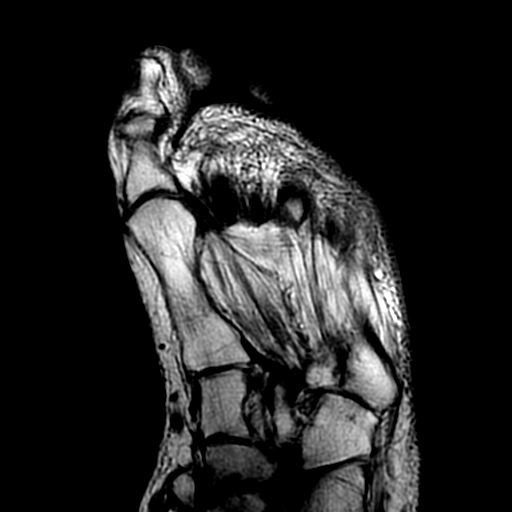
[im 20/20]
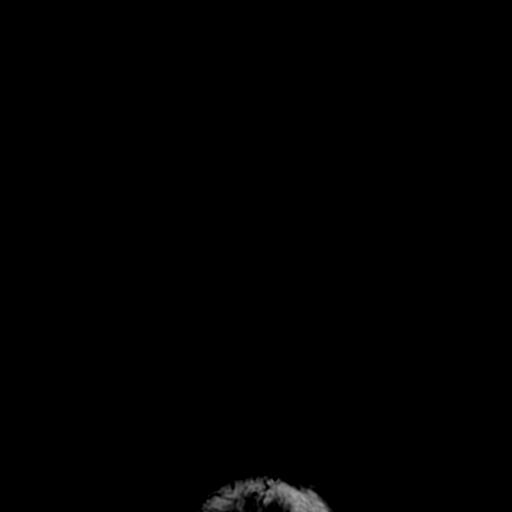

[18 of 40 positions shown; findings below may reference images not displayed]

FINDINGS: Technical note: Motion degraded examination.

Bones/Joint/Cartilage

Bone marrow edema throughout the distal phalanx of the left fourth
toe with associated confluent low T1 bone marrow signal (series 9,
images 11-13). Marrow edema extends to the level of the proximal
interphalangeal joint. The distal interphalangeal joint appears
congenitally fused. No marrow edema within the fourth toe proximal
phalanx.

There are fractures involving the subchondral aspect of the second
and third metatarsal heads with surrounding marrow edema (series 7,
image 14).

Partially visualized neuropathic arthropathy within the midfoot,
more fully characterized on recent MRI of [DATE].

Elsewhere, included osseous structures of the forefoot are intact.

Ligaments

Intact Lisfranc ligament. No evidence of collateral ligament
disruption.

Muscles and Tendons

Diffuse atrophy and fatty infiltration of the intrinsic foot
musculature suggesting chronic denervation changes.

Soft tissues

Soft tissue swelling involving the distal aspect of the fourth toe.
Suspect small plantar skin ulceration with underlying fluid
collection measuring 10 x 7 x 9 mm adjacent to the fourth toe distal
phalanx.
IMPRESSION: 1. Acute osteomyelitis of the distal phalanx of the left fourth toe.
2. Small plantar skin ulceration with underlying fluid collection
measuring 10 x 7 x 9 mm adjacent to the fourth toe distal phalanx.
3. Nondisplaced fractures involving the subchondral aspects of the
second and third metatarsal heads with surrounding marrow edema,
likely acute to subacute. Findings could be posttraumatic or
secondary to osteonecrosis.
4. Partially visualized neuropathic arthropathy within the midfoot,
more fully characterized on recent MRI of the [DATE].

## 2020-09-19 MED ORDER — GADOBUTROL 1 MMOL/ML IV SOLN
10.0000 mL | Freq: Once | INTRAVENOUS | Status: AC | PRN
  Administered 2020-09-19: 10 mL via INTRAVENOUS

## 2020-09-19 MED ORDER — SENNOSIDES-DOCUSATE SODIUM 8.6-50 MG PO TABS
1.0000 | ORAL_TABLET | Freq: Two times a day (BID) | ORAL | Status: DC
  Administered 2020-09-19 – 2020-09-24 (×10): 1 via ORAL
  Filled 2020-09-19 (×10): qty 1

## 2020-09-19 MED ORDER — LOSARTAN POTASSIUM 50 MG PO TABS
50.0000 mg | ORAL_TABLET | Freq: Every day | ORAL | Status: DC
  Administered 2020-09-19 – 2020-09-24 (×5): 50 mg via ORAL
  Filled 2020-09-19 (×5): qty 1

## 2020-09-19 MED ORDER — INSULIN DETEMIR 100 UNIT/ML ~~LOC~~ SOLN
40.0000 [IU] | Freq: Two times a day (BID) | SUBCUTANEOUS | Status: DC
  Administered 2020-09-19 – 2020-09-23 (×7): 40 [IU] via SUBCUTANEOUS
  Filled 2020-09-19 (×10): qty 0.4

## 2020-09-19 NOTE — Progress Notes (Signed)
OT Cancellation Note  Patient Details Name: DNYA HICKLE MRN: [id number] DOB: 23-Apr-1956   Cancelled Treatment:    Reason Eval/Treat Not Completed: Fatigue/lethargy limiting ability to participate. Pt recently received Dilaudid and while pain controlled, pt too lethargic to participate. Pt politely requested OT to try back later this AM.   Layla Maw 09/19/2020, 8:48 AM

## 2020-09-19 NOTE — Progress Notes (Signed)
Nutrition Follow-up  RD working remotely.  DOCUMENTATION CODES:   Morbid obesity  INTERVENTION:   -Continue MVI with minerals daily -Continue Ensure Max po daily, each supplement provides 150 kcal and 30 grams of protein -No BM documented since 09/11/20; consider initiation of bowel regimen. Discussed with MD via secure chat  NUTRITION DIAGNOSIS:   Increased nutrient needs related to acute illness as evidenced by estimated needs.  Ongoing  GOAL:   Patient will meet greater than or equal to 90% of their needs  Progressing   MONITOR:   PO intake, Supplement acceptance, Diet advancement, Labs, Weight trends, Skin, I & O's  REASON FOR ASSESSMENT:   Malnutrition Screening Tool    ASSESSMENT:   Brittany Silva is a 64 y.o. female with medical history significant of DM2, HTN, hypothyroidism. Presenting with 5 days of back/leg pain, N/V. Pain started in the right buttock on Thursday while resting. It was sharp and radiated down to the right knee. She tried APAP and heating pads but didn't work. She also had N/V during this time. These symptoms continued through Friday. She went to the ED and was told she had a UTI. She was sent home with antibiotics. The was only able to take her abx for a day or two and it did not improve her situation. Her pain/N/V worsened and she had decreased urination. Out of concern for worsening condition, she returned to the ED last night.  10/14- s/p rt L4-L5 facet aspiration, advanced to heart healthy, carb modified diet 10/17- MRI of rt foot does not reveal osteomyelitis  Reviewed I/O's: +1.1 L x 24 hours and +2.3 L since admission  Attempted to speak with pt via call to hospital room phone, however, unable to reach.   Pt remains with good appetite; noted meal completion 100%. Pt is intermittently accepting of Ensure Max supplements.    Per therapy notes, recommending home health PT services at discharge.   Noted pt with last documented BM on  09/11/20; consider bowel regimen (suspect pain medications may also be contributing to constipation). Sent secure chat message to Dr. Cyndia Skeeters regarding this.   Labs reviewed: CBGS: 140-166 (inpatient orders for glycemic control are 0-20 units insulin aspart TID with meals, 0-5 units insulin aspart daily at bedtime, 15 units insulin aspart TID with meals, and 35 units insulin detemir BID).   Diet Order:   Diet Order            Diet heart healthy/carb modified Room service appropriate? Yes; Fluid consistency: Thin  Diet effective now                 EDUCATION NEEDS:   No education needs have been identified at this time  Skin:  Skin Assessment: Skin Integrity Issues: Skin Integrity Issues:: Diabetic Ulcer Diabetic Ulcer: lt 4th toe  Last BM:  09/11/20  Height:   Ht Readings from Last 1 Encounters:  09/13/20 5\' 7"  (1.702 m)    Weight:   Wt Readings from Last 1 Encounters:  09/13/20 129.3 kg    Ideal Body Weight:  61.4 kg  BMI:  Body mass index is 44.64 kg/m.  Estimated Nutritional Needs:   Kcal:  1850-2050  Protein:  110-125 grams  Fluid:  > 1.8 L    Loistine Chance, RD, LDN, [place] Registered Dietitian II Certified Diabetes Care and Education Specialist Please refer to [facility] for RD and/or RD on-call/weekend/after hours pager

## 2020-09-19 NOTE — Plan of Care (Signed)

## 2020-09-19 NOTE — Progress Notes (Signed)
Called RN to prepare for PICC placement. Per pt, IV Team has been unable to place PICC in the past. She has had to go to radiology. Pt will need new order for radiology.

## 2020-09-19 NOTE — Progress Notes (Signed)
Subjective: No new complaints   Antibiotics:  Anti-infectives (From admission, onward)   Start     Dose/Rate Route Frequency Ordered Stop   09/17/20 1600  penicillin G potassium 12 Million Units in dextrose 5 % 500 mL continuous infusion        12 Million Units 41.7 mL/hr over 12 Hours Intravenous Every 12 hours 09/17/20 0959     09/15/20 1500  cefTRIAXone (ROCEPHIN) 2 g in sodium chloride 0.9 % 100 mL IVPB  Status:  Discontinued        2 g 200 mL/hr over 30 Minutes Intravenous Every 24 hours 09/15/20 1049 09/17/20 0959   09/15/20 0400  vancomycin (VANCOREADY) IVPB 1250 mg/250 mL  Status:  Discontinued        1,250 mg 166.7 mL/hr over 90 Minutes Intravenous Every 12 hours 09/14/20 1416 09/17/20 0959   09/14/20 1500  vancomycin (VANCOCIN) 2,500 mg in sodium chloride 0.9 % 500 mL IVPB        2,500 mg 250 mL/hr over 120 Minutes Intravenous  Once 09/14/20 1410 09/14/20 1901   09/14/20 1430  metroNIDAZOLE (FLAGYL) IVPB 500 mg  Status:  Discontinued        500 mg 100 mL/hr over 60 Minutes Intravenous Every 8 hours 09/14/20 1358 09/15/20 1343   09/14/20 1430  ceFEPIme (MAXIPIME) 2 g in sodium chloride 0.9 % 100 mL IVPB  Status:  Discontinued        2 g 200 mL/hr over 30 Minutes Intravenous Every 8 hours 09/14/20 1410 09/15/20 1049      Medications: Scheduled Meds: . acetaminophen  1,000 mg Oral Q8H  . amLODipine  10 mg Oral Daily  . atorvastatin  40 mg Oral Daily  . DULoxetine  90 mg Oral Daily  . enoxaparin (LOVENOX) injection  65 mg Subcutaneous Q24H  . insulin aspart  0-20 Units Subcutaneous TID WC  . insulin aspart  0-5 Units Subcutaneous QHS  . insulin aspart  15 Units Subcutaneous TID WC  . insulin detemir  35 Units Subcutaneous BID  . levothyroxine  75 mcg Oral Q0600  . multivitamin with minerals  1 tablet Oral Daily  . Ensure Max Protein  11 oz Oral QHS  . senna-docusate  1 tablet Oral BID  . traMADol  50 mg Oral Q8H   Continuous Infusions: . penicillin g  continuous IV infusion 12 Million Units (09/19/20 0510)   PRN Meds:.hydrALAZINE, HYDROmorphone (DILAUDID) injection, ondansetron **OR** ondansetron (ZOFRAN) IV, polyethylene glycol    Objective: Weight change:   Intake/Output Summary (Last 24 hours) at 09/19/2020 1138 Last data filed at 09/18/2020 1800 Gross per 24 hour  Intake 303.94 ml  Output --  Net 303.94 ml   Blood pressure (!) 155/64, pulse 72, temperature 98.2 F (36.8 C), temperature source Oral, resp. rate 17, height 5\' 7"  (1.702 m), weight 129.3 kg, SpO2 98 %. Temp:  [98 F (36.7 C)-98.8 F (37.1 C)] 98.2 F (36.8 C) (10/19 0805) Pulse Rate:  [72-82] 72 (10/19 0805) Resp:  [17-18] 17 (10/19 0805) BP: (123-173)/(60-70) 155/64 (10/19 0805) SpO2:  [97 %-99 %] 98 % (10/19 0805)  Physical Exam: General: Alert and awake, oriented x3, not in any acute distress. HEENT: anicteric sclera, EOMI CVS regular rate, normal  Chest: , no wheezing, no respiratory distress Abdomen: soft non-distended,  y Skin: Ulceration under fourth toe Neuro: nonfocal  CBC:    BMET Recent Labs    09/18/20 0359 09/19/20 0253  NA 132*  --  K 4.1  --   CL 97*  --   CO2 24  --   GLUCOSE 353*  --   BUN 12  --   CREATININE 0.63 0.59  CALCIUM 8.4*  --      Liver Panel  Recent Labs    09/18/20 0359  ALBUMIN 2.3*       Sedimentation Rate No results for input(s): ESRSEDRATE in the last 72 hours. C-Reactive Protein No results for input(s): CRP in the last 72 hours.  Micro Results: Recent Results (from the past 720 hour(s))  Urine culture     Status: Abnormal   Collection Time: 09/08/20 10:30 PM   Specimen: Urine, Random  Result Value Ref Range Status   Specimen Description   Final    URINE, RANDOM Performed at [facility], [address] [place], [place] [address]    Special Requests   Final    NONE Performed at [facility], [address] [place], [place] [address]    Culture 80,000  COLONIES/mL ESCHERICHIA COLI (A)  Final   Report Status 09/11/2020 FINAL  Final   Organism ID, Bacteria ESCHERICHIA COLI (A)  Final      Susceptibility   Escherichia coli - MIC*    AMPICILLIN >=32 RESISTANT Resistant     CEFAZOLIN <=4 SENSITIVE Sensitive     CEFTRIAXONE <=0.25 SENSITIVE Sensitive     CIPROFLOXACIN <=0.25 SENSITIVE Sensitive     GENTAMICIN <=1 SENSITIVE Sensitive     IMIPENEM <=0.25 SENSITIVE Sensitive     NITROFURANTOIN <=16 SENSITIVE Sensitive     TRIMETH/SULFA <=20 SENSITIVE Sensitive     AMPICILLIN/SULBACTAM 8 SENSITIVE Sensitive     PIP/TAZO <=4 SENSITIVE Sensitive     * 80,000 COLONIES/mL ESCHERICHIA COLI  Respiratory Panel by RT PCR (Flu A&B, Covid) - Nasopharyngeal Swab     Status: None   Collection Time: 09/12/20  1:43 PM   Specimen: Nasopharyngeal Swab  Result Value Ref Range Status   SARS Coronavirus 2 by RT PCR NEGATIVE NEGATIVE Final    Comment: (NOTE) SARS-CoV-2 target nucleic acids are NOT DETECTED.  The SARS-CoV-2 RNA is generally detectable in upper respiratoy specimens during the acute phase of infection. The lowest concentration of SARS-CoV-2 viral copies this assay can detect is 131 copies/mL. A negative result does not preclude SARS-Cov-2 infection and should not be used as the sole basis for treatment or other patient management decisions. A negative result may occur with  improper specimen collection/handling, submission of specimen other than nasopharyngeal swab, presence of viral mutation(s) within the areas targeted by this assay, and inadequate number of viral copies (<131 copies/mL). A negative result must be combined with clinical observations, patient history, and epidemiological information. The expected result is Negative.  Fact Sheet for Patients:  PinkCheek.be  Fact Sheet for Healthcare Providers:  GravelBags.it  This test is no t yet approved or cleared by the [place] FDA and  has been authorized for detection and/or diagnosis of SARS-CoV-2 by FDA under an Emergency Use Authorization (EUA). This EUA will remain  in effect (meaning this test can be used) for the duration of the COVID-19 declaration under Section 564(b)(1) of the Act, 21 U.S.C. section 360bbb-3(b)(1), unless the authorization is terminated or revoked sooner.     Influenza A by PCR NEGATIVE NEGATIVE Final   Influenza B by PCR NEGATIVE NEGATIVE Final    Comment: (NOTE) The Xpert Xpress SARS-CoV-2/FLU/RSV assay is intended as an aid in  the diagnosis of influenza from Nasopharyngeal swab specimens and  should not be used as a sole basis for treatment. Nasal washings and  aspirates are unacceptable for Xpert Xpress SARS-CoV-2/FLU/RSV  testing.  Fact Sheet for Patients: PinkCheek.be  Fact Sheet for Healthcare Providers: GravelBags.it  This test is not yet approved or cleared by the [place] FDA and  has been authorized for detection and/or diagnosis of SARS-CoV-2 by  FDA under an Emergency Use Authorization (EUA). This EUA will remain  in effect (meaning this test can be used) for the duration of the  Covid-19 declaration under Section 564(b)(1) of the Act, 21  U.S.C. section 360bbb-3(b)(1), unless the authorization is  terminated or revoked. Performed at [facility], [place] [address], [place], [place] [address]   Aerobic/Anaerobic Culture (surgical/deep wound)     Status: None   Collection Time: 09/14/20  1:52 PM   Specimen: Abscess; Synovial Fluid  Result Value Ref Range Status   Specimen Description ABSCESS  Final   Special Requests RIGHT L4 L5 JOINT  Final   Gram Stain   Final    ABUNDANT WBC PRESENT, PREDOMINANTLY PMN MODERATE GRAM POSITIVE COCCI IN PAIRS IN CHAINS    Culture   Final    FEW STREPTOCOCCUS MITIS/ORALIS NO ANAEROBES ISOLATED Performed at [facility], [address], [place],  [address]    Report Status 09/19/2020 FINAL  Final   Organism ID, Bacteria STREPTOCOCCUS MITIS/ORALIS  Final      Susceptibility   Streptococcus mitis/oralis - MIC*    TETRACYCLINE 0.5 SENSITIVE Sensitive     VANCOMYCIN 0.5 SENSITIVE Sensitive     CLINDAMYCIN 0.5 INTERMEDIATE Intermediate     PENICILLIN Value in next row Sensitive      SENSITIVE0.06    CEFTRIAXONE Value in next row Sensitive      SENSITIVE0.12    * FEW STREPTOCOCCUS MITIS/ORALIS    Studies/Results: VAS [place] ABI WITH/WO TBI  Result Date: 09/18/2020 LOWER EXTREMITY DOPPLER STUDY Indications: Ulceration. High Risk Factors: Hypertension, Diabetes.  Limitations: Today's exam was limited due to irregular heart rhythm. Performing Technologist: Sharion Dove RVS  Examination Guidelines: A complete evaluation includes at minimum, Doppler waveform signals and systolic blood pressure reading at the level of bilateral brachial, anterior tibial, and posterior tibial arteries, when vessel segments are accessible. Bilateral testing is considered an integral part of a complete examination. Photoelectric Plethysmograph (PPG) waveforms and toe systolic pressure readings are included as required and additional duplex testing as needed. Limited examinations for reoccurring indications may be performed as noted.  ABI Findings: +---------+------------------+-----+-----------+--------+ Right    Rt Pressure (mmHg)IndexWaveform   Comment  +---------+------------------+-----+-----------+--------+ Brachial 178                    multiphasic         +---------+------------------+-----+-----------+--------+ PTA      182               1.02 multiphasic         +---------+------------------+-----+-----------+--------+ DP       178               1.00 multiphasic         +---------+------------------+-----+-----------+--------+ Great Toe104               0.58                      +---------+------------------+-----+-----------+--------+ +---------+------------------+-----+-----------+-------+ Left     Lt Pressure (mmHg)IndexWaveform  Comment +---------+------------------+-----+-----------+-------+ Brachial 178                    multiphasic        +---------+------------------+-----+-----------+-------+ PTA      160               0.90 multiphasic        +---------+------------------+-----+-----------+-------+ DP       163               0.92 multiphasic        +---------+------------------+-----+-----------+-------+ Great Toe89                0.50                    +---------+------------------+-----+-----------+-------+ +-------+-----------+-----------+------------+------------+ ABI/TBIToday's ABIToday's TBIPrevious ABIPrevious TBI +-------+-----------+-----------+------------+------------+ Right  1.02       0.58                                +-------+-----------+-----------+------------+------------+ Left   0.92       0.50                                +-------+-----------+-----------+------------+------------+  Summary: Right: Resting right ankle-brachial index is within normal range. No evidence of significant right lower extremity arterial disease. The right toe-brachial index is abnormal. Left: Resting left ankle-brachial index is within normal range. No evidence of significant left lower extremity arterial disease. The left toe-brachial index is abnormal.  *See table(s) above for measurements and observations.  Electronically signed by Ruta Hinds MD on 09/18/2020 at 5:41:59 PM.   Final       Assessment/Plan:  INTERVAL HISTORY:   Over the weekend she had an MRI of her ankle but not of the foot though the former was labeled as the latter.  ABI show abnormal TBI bilaterally   Active Problems:   Type 2 diabetes mellitus with hyperglycemia (HCC)   Abscess in epidural space of lumbar spine   Diabetic foot ulcer (HCC)    Ankle pain   Epidural abscess    Brittany Silva is a 64 y.o. female with  L4-5 facet joint and epidural abscess.  Status post IR guided aspiration with Strep mitis/oralis S to PCN and now on high dose penicillin.  Outstanding issues remained source and my concern that her left foot may be harboring a deeper infection near her fourth toe.  MRI of her ankle where she had hardware was reassuring.  Note this study was labeled as an MRI foot when in fact it was an MRI ankle if 1 looks at the images the toes are missing from them.  I have reordered an MRI foot so that we can take a look at the toes.  Once we have clarity about whether she needs surgical attention to a toe or any other infectious process in her left foot we can move further  I will put an order for PICC line and O PAT order in the interim.     LOS: 7 days   Alcide Evener 09/19/2020, 11:38 AM

## 2020-09-19 NOTE — Progress Notes (Signed)
MRI called this morning to follow-up, per MRI Pt is still on their list and they will take her as soon as they can.

## 2020-09-19 NOTE — Plan of Care (Signed)
  Problem: Clinical Measurements: Goal: Ability to maintain clinical measurements within normal limits will improve Outcome: Progressing   Problem: Nutrition: Goal: Adequate nutrition will be maintained Outcome: Progressing   Problem: Coping: Goal: Level of anxiety will decrease Outcome: Progressing   Problem: Pain Managment: Goal: General experience of comfort will improve Outcome: Progressing   Problem: Safety: Goal: Ability to remain free from injury will improve Outcome: Progressing   Problem: Skin Integrity: Goal: Risk for impaired skin integrity will decrease Outcome: Progressing   

## 2020-09-19 NOTE — Progress Notes (Signed)
Occupational Therapy Treatment Patient Details Name: Brittany Silva MRN: [id number] DOB: 14-Feb-1956 Today's Date: 09/19/2020    History of present illness Brittany Silva is a 64 y.o. female with medical history significant of DM2, HTN, hypothyroidism. Presenting with 5 days of back/leg pain, N/V. Presented to ED, diagnosed with UTI and sent home with antibiotics. Symptoms did not improve, so pt returned back to ED. MRI showed possible epidural abscess. Pt admitted and underwent L4-L5 facet aspiration   OT comments  Pt progressing well with OT goals and noted with still present, but improved pain levels. Pt overall Supervision for pivot to [facility] using RW, increased time for task due to difficulty emptying bladder with pt able to demo techniques to assist with efficiency of task. Educated pt on use of AE for LB ADLs to minimize back pain with pt able to return demo their use with Min A. Plan to further educate on adaptive strategies for ADLs/IADLs and progress activity tolerance.    Follow Up Recommendations  Home health OT;Supervision - Intermittent    Equipment Recommendations  3 in 1 bedside commode;Tub/shower bench;Other (comment) (RW)    Recommendations for Other Services      Precautions / Restrictions Precautions Precautions: Fall Precaution Comments: back pain Restrictions Weight Bearing Restrictions: No       Mobility Bed Mobility Overal bed mobility: Needs Assistance Bed Mobility: Sidelying to Sit;Sit to Supine   Sidelying to sit: Supervision   Sit to supine: Supervision;HOB elevated   General bed mobility comments: Supervision with cues for long rolling to minimize back pain  Transfers Overall transfer level: Needs assistance Equipment used: Rolling walker (2 wheeled) Transfers: Sit to/from [facility] Sit to Stand: Supervision Stand pivot transfers: Supervision       General transfer comment: Supervision for safety, cues for hand placement      Balance Overall balance assessment: Needs assistance Sitting-balance support: No upper extremity supported;Feet supported Sitting balance-Leahy Scale: Good     Standing balance support: Bilateral upper extremity supported;During functional activity Standing balance-Leahy Scale: Poor Standing balance comment: reliant on UE support                           ADL either performed or assessed with clinical judgement   ADL Overall ADL's : Needs assistance/impaired                     Lower Body Dressing: Minimal assistance;Sit to/from stand;With adaptive equipment;Sitting/lateral leans Lower Body Dressing Details (indicate cue type and reason): Overall Min A for use of sock aid to don socks, able to doff using figure four position but with increased effort. Educated on other AE for LB dressing Management consultant) with plans to assess during next session Toilet Transfer: Engineer, materials Details (indicate cue type and reason): Supervision, no assist needed but cues for hand placement Toileting- Clothing Manipulation and Hygiene: Sit to/from stand;Supervision/safety Toileting - Clothing Manipulation Details (indicate cue type and reason): No assist needed for anterior peri care in standing after setup        General ADL Comments: Improved pain control and ability to mobilize. Limited by urinary frequency/incontinence     Vision   Vision Assessment?: No apparent visual deficits   Perception     Praxis      Cognition Arousal/Alertness: Awake/alert Behavior During Therapy: WFL for tasks assessed/performed Overall Cognitive Status: Within Functional Limits for tasks assessed  Exercises     Shoulder Instructions       General Comments Further educated on AE for LB dressing and to ease back pain during daily tasks. Continued problem solving with pt appreciative of  collaborative education     Pertinent Vitals/ Pain       Pain Assessment: 0-10 Pain Score: 4  Pain Location: low back Pain Descriptors / Indicators: Grimacing;Discomfort;Constant;Spasm Pain Intervention(s): Monitored during session;Limited activity within patient's tolerance;Premedicated before session  Home Living                                          Prior Functioning/Environment              Frequency  Min 3X/week        Progress Toward Goals  OT Goals(current goals can now be found in the care plan section)  Progress towards OT goals: Progressing toward goals  Acute Rehab OT Goals Patient Stated Goal: pain control, be able to take care of myself again and clean out my house OT Goal Formulation: With patient Time For Goal Achievement: 09/29/20 Potential to Achieve Goals: Good ADL Goals Pt Will Perform Grooming: with modified independence;standing Pt Will Perform Lower Body Bathing: with min guard assist;sit to/from stand;with adaptive equipment;sitting/lateral leans Pt Will Perform Lower Body Dressing: with min guard assist;with adaptive equipment;sit to/from stand;sitting/lateral leans Pt Will Transfer to Toilet: with modified independence;bedside commode;regular height toilet;ambulating Pt Will Perform Toileting - Clothing Manipulation and hygiene: with modified independence;sit to/from stand;sitting/lateral leans  Plan Discharge plan remains appropriate;Frequency needs to be updated    Co-evaluation                 AM-PAC OT "6 Clicks" Daily Activity     Outcome Measure   Help from another person eating meals?: None Help from another person taking care of personal grooming?: A Little Help from another person toileting, which includes using toliet, bedpan, or urinal?: A Little Help from another person bathing (including washing, rinsing, drying)?: A Lot Help from another person to put on and taking off regular upper body clothing?:  A Little Help from another person to put on and taking off regular lower body clothing?: A Little 6 Click Score: 18    End of Session Equipment Utilized During Treatment: Rolling walker  OT Visit Diagnosis: Unsteadiness on feet (R26.81);Other abnormalities of gait and mobility (R26.89);Muscle weakness (generalized) (M62.81);Pain Pain - Right/Left: Right Pain - part of body:  (back)   Activity Tolerance Patient tolerated treatment well   Patient Left in bed;with call bell/phone within reach   Nurse Communication Mobility status        Time: 1000-1047 OT Time Calculation (min): 47 min  Charges: OT General Charges $OT Visit: 1 Visit OT Treatments $Self Care/Home Management : 38-52 mins  Layla Maw, OTR/L   Layla Maw 09/19/2020, 12:09 PM

## 2020-09-19 NOTE — Progress Notes (Signed)
PROGRESS NOTE  Brittany Silva [id number]:[id number] DOB: 1956-05-05   PCP: Carol Ada, MD  Patient is from: Home  DOA: 09/11/2020 LOS: 7  Chief complaints: Back pain  Brief Narrative / Interim history: 64 year old female with history of chronic back pain, DM-2, HTN, hypothyroidism, morbid obesity and recent UTI presenting with 5 days of lower back pain, bilateral leg pain, nausea, vomiting, fever and chills.  In ED, MRI lumbar spine concerning for lumbar epidural abscess around the L5-S1 disc space and soft tissue enhancement on the right posterior to the L4-5 and L5-S1 facet joints.  Neurosurgery consulted, and recommended transfer to [facility].  Concerned that a diabetic foot ulcer in her left fourth toe could be source of infection. She also has hardwares in her left foot.  The next day, evaluated by neurosurgery who recommended IR biopsy for tissue diagnosis and ID consult for antibiotics. Patient underwent fluoroscopy guided needle aspiration.  Tissue culture sent.  Started on IV vancomycin, cefepime and Flagyl which was deescalated to vancomycin and ceftriaxone, the to IV penicillin G.  MRI left foot concerning for Charcot arthropathy versus osteomyelitis but didn't include the toe with concern. Normal ABI but abnormal TBI.  Repeat MRI of left foot ordered.   Subjective: Seen and examined earlier this morning.  No major events overnight or this morning.  Looks sleepy this morning.  She says she did not have a good sleep last night.  Pain 2/10.  No other complaints.  Objective: Vitals:   09/18/20 2154 09/19/20 0001 09/19/20 0539 09/19/20 0805  BP: (!) 153/70 (!) 173/68 (!) 157/70 (!) 155/64  Pulse: 78  81 72  Resp: _0 Temp: 98.6 F (37 C)  98.8 F (37.1 C) 98.2 F (36.8 C)  TempSrc: Oral  Oral Oral  SpO2: 99% 97% 98% 98%  Weight:      Height:        Intake/Output Summary (Last 24 hours) at 09/19/2020 1337 Last data filed at 09/19/2020 1100 Gross per 24 hour    Intake 772.84 ml  Output --  Net 772.84 ml   Filed Weights   09/13/20 0739  Weight: 129.3 kg    Examination:  GENERAL: No apparent distress.  Nontoxic. HEENT: MMM.  Vision and hearing grossly intact.  NECK: Supple.  No apparent JVD.  RESP: On room air.  No IWOB.  Fair aeration bilaterally. CVS:  RRR. Heart sounds normal.  ABD/GI/GU: BS+. Abd soft, NTND.  MSK/EXT:  Moves extremities. No apparent deformity. No edema.  SKIN: Small dry scab over the tip of left. NEURO: Sleepy but wakes to voice easily.  Not quite alert.  No apparent focal neuro deficit. PSYCH: Calm. Normal affect.   Procedures:  10/14-fluoroscopy guided needle aspiration of facet joints  Microbiology summarized: COVID-19 PCR negative.  Assessment & Plan: Acute on chronic low back pain/radiculopathy L5-S1 epidural abscess-no signs of cauda equina L4-5 and L5-S1 facet joint infection.  Left foot pain with left 4th to diabetic wound -Imaging and ABI as above.  TTE without significant finding.  CRP 23>> 17.  ESR 85>> 77.  Mild leukocytosis. -Neurosurgery consulted and recommended IR biopsy and ID consult for antibiotics. -Underwent fluoroscopy guided needle aspiration of the facet joints. -Vancomycin, cefepime and Flagyl 10/14-10/15 -Vancomycin and ceftriaxone 10/15>> 10/17 -Surgical tissue cultures grew few Streptococcus mitis/oralis sensitive to penicillin -IV penicillin G 10/17>> may need PICC line.  ID to decide timing. -Pain control scheduled Tylenol and tramadol, and IV Dilaudid for breakthrough pain -Bowel  regimen with as needed MiraLAX and Senokot-S -Repeat MRI left foot. Prior image didn't include toes -PT/OT eval   Uncontrolled DM-2 with hypo- and hyperglycemia: On Levemir 75 units twice daily and SSI.  A1c 8.6%. Recent Labs  Lab 09/18/20 1646 09/18/20 2049 09/19/20 0010 09/19/20 0636 09/19/20 1139  GLUCAP 153* 140* 166* 159* 197*  -Continue SSI-high.  -Increase NovoLog AC from 12 to 15  units -Increase Levemir from 35 to 40 units twice daily -Continue atorvastatin  Essential hypertension: SBP slightly elevated -Continue home amlodipine -Resume home losartan  Hypothyroidism -Continue home Synthroid  Hypokalemia-resolved.  Hyponatremia: Na 132.  Stable. -Recheck in the morning  Acute cystitis-treated with IV ceftriaxone x1 and Keflex x3 days -Should be covered with antibiotics as above.  Morbid obesity Body mass index is 44.64 kg/m. Nutrition Problem: Increased nutrient needs Etiology: acute illness Signs/Symptoms: estimated needs Interventions: MVI, Premier Protein   DVT prophylaxis:  Place and maintain sequential compression device Start: 09/12/20 1806  Code Status: Full code Family Communication: Patient and/or RN. Available if any question.  Status is: Inpatient  Remains inpatient appropriate because:Ongoing diagnostic testing needed not appropriate for outpatient work up, IV treatments appropriate due to intensity of illness or inability to take PO and Inpatient level of care appropriate due to severity of illness   Dispo: The patient is from: Home              Anticipated d/c is to: Home              Anticipated d/c date is: 2 days once cleared by ID.              Patient currently is not medically stable to d/c.       Consultants:  Neurosurgery-signed off. IR-off. Infectious disease   Sch Meds:  Scheduled Meds: . acetaminophen  1,000 mg Oral Q8H  . amLODipine  10 mg Oral Daily  . atorvastatin  40 mg Oral Daily  . DULoxetine  90 mg Oral Daily  . enoxaparin (LOVENOX) injection  65 mg Subcutaneous Q24H  . insulin aspart  0-20 Units Subcutaneous TID WC  . insulin aspart  0-5 Units Subcutaneous QHS  . insulin aspart  15 Units Subcutaneous TID WC  . insulin detemir  35 Units Subcutaneous BID  . levothyroxine  75 mcg Oral Q0600  . multivitamin with minerals  1 tablet Oral Daily  . Ensure Max Protein  11 oz Oral QHS  . senna-docusate   1 tablet Oral BID  . traMADol  50 mg Oral Q8H   Continuous Infusions: . penicillin g continuous IV infusion 12 Million Units (09/19/20 0510)   PRN Meds:.hydrALAZINE, HYDROmorphone (DILAUDID) injection, ondansetron **OR** ondansetron (ZOFRAN) IV, polyethylene glycol  Antimicrobials: Anti-infectives (From admission, onward)   Start     Dose/Rate Route Frequency Ordered Stop   09/17/20 1600  penicillin G potassium 12 Million Units in dextrose 5 % 500 mL continuous infusion        12 Million Units 41.7 mL/hr over 12 Hours Intravenous Every 12 hours 09/17/20 0959     09/15/20 1500  cefTRIAXone (ROCEPHIN) 2 g in sodium chloride 0.9 % 100 mL IVPB  Status:  Discontinued        2 g 200 mL/hr over 30 Minutes Intravenous Every 24 hours 09/15/20 1049 09/17/20 0959   09/15/20 0400  vancomycin (VANCOREADY) IVPB 1250 mg/250 mL  Status:  Discontinued        1,250 mg 166.7 mL/hr over 90 Minutes Intravenous Every  12 hours 09/14/20 1416 09/17/20 0959   09/14/20 1500  vancomycin (VANCOCIN) 2,500 mg in sodium chloride 0.9 % 500 mL IVPB        2,500 mg 250 mL/hr over 120 Minutes Intravenous  Once 09/14/20 1410 09/14/20 1901   09/14/20 1430  metroNIDAZOLE (FLAGYL) IVPB 500 mg  Status:  Discontinued        500 mg 100 mL/hr over 60 Minutes Intravenous Every 8 hours 09/14/20 1358 09/15/20 1343   09/14/20 1430  ceFEPIme (MAXIPIME) 2 g in sodium chloride 0.9 % 100 mL IVPB  Status:  Discontinued        2 g 200 mL/hr over 30 Minutes Intravenous Every 8 hours 09/14/20 1410 09/15/20 1049       I have personally reviewed the following labs and images: CBC: Recent Labs  Lab 09/13/20 1525 09/14/20 0106 09/15/20 0322 09/16/20 0155 09/18/20 0359  WBC 9.7 10.8* 11.1* 11.5* 11.3*  NEUTROABS 7.7 8.6*  --  9.1*  --   HGB 12.3 12.3 11.4* 11.7* 12.1  HCT 36.5 36.9 35.0* 35.9* 36.5  MCV 85.9 86.2 86.6 89.1 87.7  PLT 378 417* 413* 407* 425*   BMP &GFR Recent Labs  Lab 09/13/20 1525 09/14/20 0106  09/15/20 0322 09/16/20 0155 09/18/20 0359 09/19/20 0253  NA 131*  --  131* 132* 132*  --   K 4.1  --  3.9 3.6 4.1  --   CL 95*  --  97* 97* 97*  --   CO2 24  --  _0 --   GLUCOSE 219*  --  279* 230* 353*  --   BUN 8  --  _1 --   CREATININE 0.72  --  0.77 0.74 0.63 0.59  CALCIUM 8.1*  --  8.0* 8.4* 8.4*  --   MG 2.1 2.1 2.3 2.1 2.0  --   PHOS 2.1*  --  3.3 3.5 2.9  --    Estimated Creatinine Clearance: 99.5 mL/min (by C-G formula based on SCr of 0.59 mg/dL). Liver & Pancreas: Recent Labs  Lab 09/13/20 1525 09/15/20 0322 09/16/20 0155 09/18/20 0359  ALBUMIN 2.4* 2.3* 2.2* 2.3*   No results for input(s): LIPASE, AMYLASE in the last 168 hours. No results for input(s): AMMONIA in the last 168 hours. Diabetic: No results for input(s): HGBA1C in the last 72 hours. Recent Labs  Lab 09/18/20 1646 09/18/20 2049 09/19/20 0010 09/19/20 0636 09/19/20 1139  GLUCAP 153* 140* 166* 159* 197*   Cardiac Enzymes: No results for input(s): CKTOTAL, CKMB, CKMBINDEX, TROPONINI in the last 168 hours. No results for input(s): PROBNP in the last 8760 hours. Coagulation Profile: Recent Labs  Lab 09/14/20 0106  INR 1.1   Thyroid Function Tests: No results for input(s): TSH, T4TOTAL, FREET4, T3FREE, THYROIDAB in the last 72 hours. Lipid Profile: No results for input(s): CHOL, HDL, LDLCALC, TRIG, CHOLHDL, LDLDIRECT in the last 72 hours. Anemia Panel: No results for input(s): VITAMINB12, FOLATE, FERRITIN, TIBC, IRON, RETICCTPCT in the last 72 hours. Urine analysis:    Component Value Date/Time   COLORURINE YELLOW 09/12/2020 0528   APPEARANCEUR CLEAR 09/12/2020 0528   LABSPEC 1.022 09/12/2020 0528   PHURINE 5.0 09/12/2020 0528   GLUCOSEU >=500 (A) 09/12/2020 0528   HGBUR NEGATIVE 09/12/2020 0528   HGBUR trace-lysed 05/16/2008 0849   BILIRUBINUR NEGATIVE 09/12/2020 0528   KETONESUR NEGATIVE 09/12/2020 0528   PROTEINUR NEGATIVE 09/12/2020 0528   UROBILINOGEN 0.2  05/16/2008 0849   NITRITE NEGATIVE 09/12/2020  [place] 09/12/2020 0528   Sepsis Labs: Invalid input(s): PROCALCITONIN, [place]  Microbiology: Recent Results (from the past 240 hour(s))  Respiratory Panel by RT PCR (Flu A&B, Covid) - Nasopharyngeal Swab     Status: None   Collection Time: 09/12/20  1:43 PM   Specimen: Nasopharyngeal Swab  Result Value Ref Range Status   SARS Coronavirus 2 by RT PCR NEGATIVE NEGATIVE Final    Comment: (NOTE) SARS-CoV-2 target nucleic acids are NOT DETECTED.  The SARS-CoV-2 RNA is generally detectable in upper respiratoy specimens during the acute phase of infection. The lowest concentration of SARS-CoV-2 viral copies this assay can detect is 131 copies/mL. A negative result does not preclude SARS-Cov-2 infection and should not be used as the sole basis for treatment or other patient management decisions. A negative result may occur with  improper specimen collection/handling, submission of specimen other than nasopharyngeal swab, presence of viral mutation(s) within the areas targeted by this assay, and inadequate number of viral copies (<131 copies/mL). A negative result must be combined with clinical observations, patient history, and epidemiological information. The expected result is Negative.  Fact Sheet for Patients:  PinkCheek.be  Fact Sheet for Healthcare Providers:  GravelBags.it  This test is no t yet approved or cleared by the [place] FDA and  has been authorized for detection and/or diagnosis of SARS-CoV-2 by FDA under an Emergency Use Authorization (EUA). This EUA will remain  in effect (meaning this test can be used) for the duration of the COVID-19 declaration under Section 564(b)(1) of the Act, 21 U.S.C. section 360bbb-3(b)(1), unless the authorization is terminated or revoked sooner.     Influenza A by PCR NEGATIVE NEGATIVE Final    Influenza B by PCR NEGATIVE NEGATIVE Final    Comment: (NOTE) The Xpert Xpress SARS-CoV-2/FLU/RSV assay is intended as an aid in  the diagnosis of influenza from Nasopharyngeal swab specimens and  should not be used as a sole basis for treatment. Nasal washings and  aspirates are unacceptable for Xpert Xpress SARS-CoV-2/FLU/RSV  testing.  Fact Sheet for Patients: PinkCheek.be  Fact Sheet for Healthcare Providers: GravelBags.it  This test is not yet approved or cleared by the [place] FDA and  has been authorized for detection and/or diagnosis of SARS-CoV-2 by  FDA under an Emergency Use Authorization (EUA). This EUA will remain  in effect (meaning this test can be used) for the duration of the  Covid-19 declaration under Section 564(b)(1) of the Act, 21  U.S.C. section 360bbb-3(b)(1), unless the authorization is  terminated or revoked. Performed at [facility], [place] [address], [place], [place] [address]   Aerobic/Anaerobic Culture (surgical/deep wound)     Status: None   Collection Time: 09/14/20  1:52 PM   Specimen: Abscess; Synovial Fluid  Result Value Ref Range Status   Specimen Description ABSCESS  Final   Special Requests RIGHT L4 L5 JOINT  Final   Gram Stain   Final    ABUNDANT WBC PRESENT, PREDOMINANTLY PMN MODERATE GRAM POSITIVE COCCI IN PAIRS IN CHAINS    Culture   Final    FEW STREPTOCOCCUS MITIS/ORALIS NO ANAEROBES ISOLATED Performed at [facility], [address] [place], [place] [address]    Report Status 09/19/2020 FINAL  Final   Organism ID, Bacteria STREPTOCOCCUS MITIS/ORALIS  Final      Susceptibility   Streptococcus mitis/oralis - MIC*    TETRACYCLINE 0.5 SENSITIVE Sensitive     VANCOMYCIN 0.5 SENSITIVE Sensitive  CLINDAMYCIN 0.5 INTERMEDIATE Intermediate     PENICILLIN Value in next row Sensitive      SENSITIVE0.06    CEFTRIAXONE Value in next row Sensitive       SENSITIVE0.12    * FEW STREPTOCOCCUS MITIS/ORALIS    Radiology Studies: [place] EKG SITE RITE  Result Date: 09/19/2020 If Site Rite image not attached, placement could not be confirmed due to current cardiac rhythm.    Mivaan Corbitt T. [place]  If 7PM-7AM, please contact night-coverage [email or web address] 09/19/2020, 1:37 PM

## 2020-09-19 NOTE — Progress Notes (Signed)
PHARMACY CONSULT NOTE FOR:  OUTPATIENT  PARENTERAL ANTIBIOTIC THERAPY (OPAT)  Indication: osteomyelitis Regimen: Penicillin G IVPB 24 million units over 24 hours as a continuous infusion End date: 10/24/20  IV antibiotic discharge orders are pended. To discharging provider:  please sign these orders via discharge navigator,  Select New Orders & click on the button choice - Manage This Unsigned Work.     Thank you for allowing pharmacy to be a part of this patient's care.  Dimple Nanas, PharmD PGY-1 Acute Care Pharmacy Resident Office: [phone] 09/19/2020 1:08 PM

## 2020-09-20 ENCOUNTER — Telehealth: Payer: Self-pay | Admitting: Podiatry

## 2020-09-20 ENCOUNTER — Inpatient Hospital Stay (HOSPITAL_COMMUNITY): Payer: 59

## 2020-09-20 DIAGNOSIS — M86672 Other chronic osteomyelitis, left ankle and foot: Secondary | ICD-10-CM

## 2020-09-20 DIAGNOSIS — G061 Intraspinal abscess and granuloma: Secondary | ICD-10-CM | POA: Diagnosis not present

## 2020-09-20 LAB — CBC WITH DIFFERENTIAL/PLATELET
Abs Immature Granulocytes: 0.04 10*3/uL (ref 0.00–0.07)
Basophils Absolute: 0 10*3/uL (ref 0.0–0.1)
Basophils Relative: 0 %
Eosinophils Absolute: 0.2 10*3/uL (ref 0.0–0.5)
Eosinophils Relative: 3 %
HCT: 34.9 % — ABNORMAL LOW (ref 36.0–46.0)
Hemoglobin: 11.5 g/dL — ABNORMAL LOW (ref 12.0–15.0)
Immature Granulocytes: 1 %
Lymphocytes Relative: 13 %
Lymphs Abs: 1.1 10*3/uL (ref 0.7–4.0)
MCH: 28.9 pg (ref 26.0–34.0)
MCHC: 33 g/dL (ref 30.0–36.0)
MCV: 87.7 fL (ref 80.0–100.0)
Monocytes Absolute: 0.8 10*3/uL (ref 0.1–1.0)
Monocytes Relative: 9 %
Neutro Abs: 6.3 10*3/uL (ref 1.7–7.7)
Neutrophils Relative %: 74 %
Platelets: 424 10*3/uL — ABNORMAL HIGH (ref 150–400)
RBC: 3.98 MIL/uL (ref 3.87–5.11)
RDW: 13.8 % (ref 11.5–15.5)
WBC: 8.4 10*3/uL (ref 4.0–10.5)
nRBC: 0 % (ref 0.0–0.2)

## 2020-09-20 LAB — RENAL FUNCTION PANEL
Albumin: 2.3 g/dL — ABNORMAL LOW (ref 3.5–5.0)
Anion gap: 11 (ref 5–15)
BUN: 8 mg/dL (ref 8–23)
CO2: 27 mmol/L (ref 22–32)
Calcium: 8.8 mg/dL — ABNORMAL LOW (ref 8.9–10.3)
Chloride: 98 mmol/L (ref 98–111)
Creatinine, Ser: 0.59 mg/dL (ref 0.44–1.00)
GFR, Estimated: >60 mL/min (ref >60)
Glucose, Bld: 109 mg/dL — ABNORMAL HIGH (ref 70–99)
Phosphorus: 3.6 mg/dL (ref 2.5–4.6)
Potassium: 3.8 mmol/L (ref 3.5–5.1)
Sodium: 136 mmol/L (ref 135–145)

## 2020-09-20 LAB — SURGICAL PCR SCREEN
MRSA, PCR: NEGATIVE
Staphylococcus aureus: NEGATIVE

## 2020-09-20 LAB — GLUCOSE, CAPILLARY
Glucose-Capillary: 126 mg/dL — ABNORMAL HIGH (ref 70–99)
Glucose-Capillary: 142 mg/dL — ABNORMAL HIGH (ref 70–99)
Glucose-Capillary: 115 mg/dL — ABNORMAL HIGH (ref 70–99)
Glucose-Capillary: 69 mg/dL — ABNORMAL LOW (ref 70–99)
Glucose-Capillary: 169 mg/dL — ABNORMAL HIGH (ref 70–99)

## 2020-09-20 LAB — MAGNESIUM: Magnesium: 1.9 mg/dL (ref 1.7–2.4)

## 2020-09-20 IMAGING — XA IR PICC >5YO
2 series · 3 of 3 positions shown · non-contrast
Comparison: none

INDICATION: Patient history of poor venous access, need for long-term IV
antibiotic therapy. Request to IR for PICC placement.

[Series 1: (phone_number) · 1 of 1 slices shown]
[im 1/1]
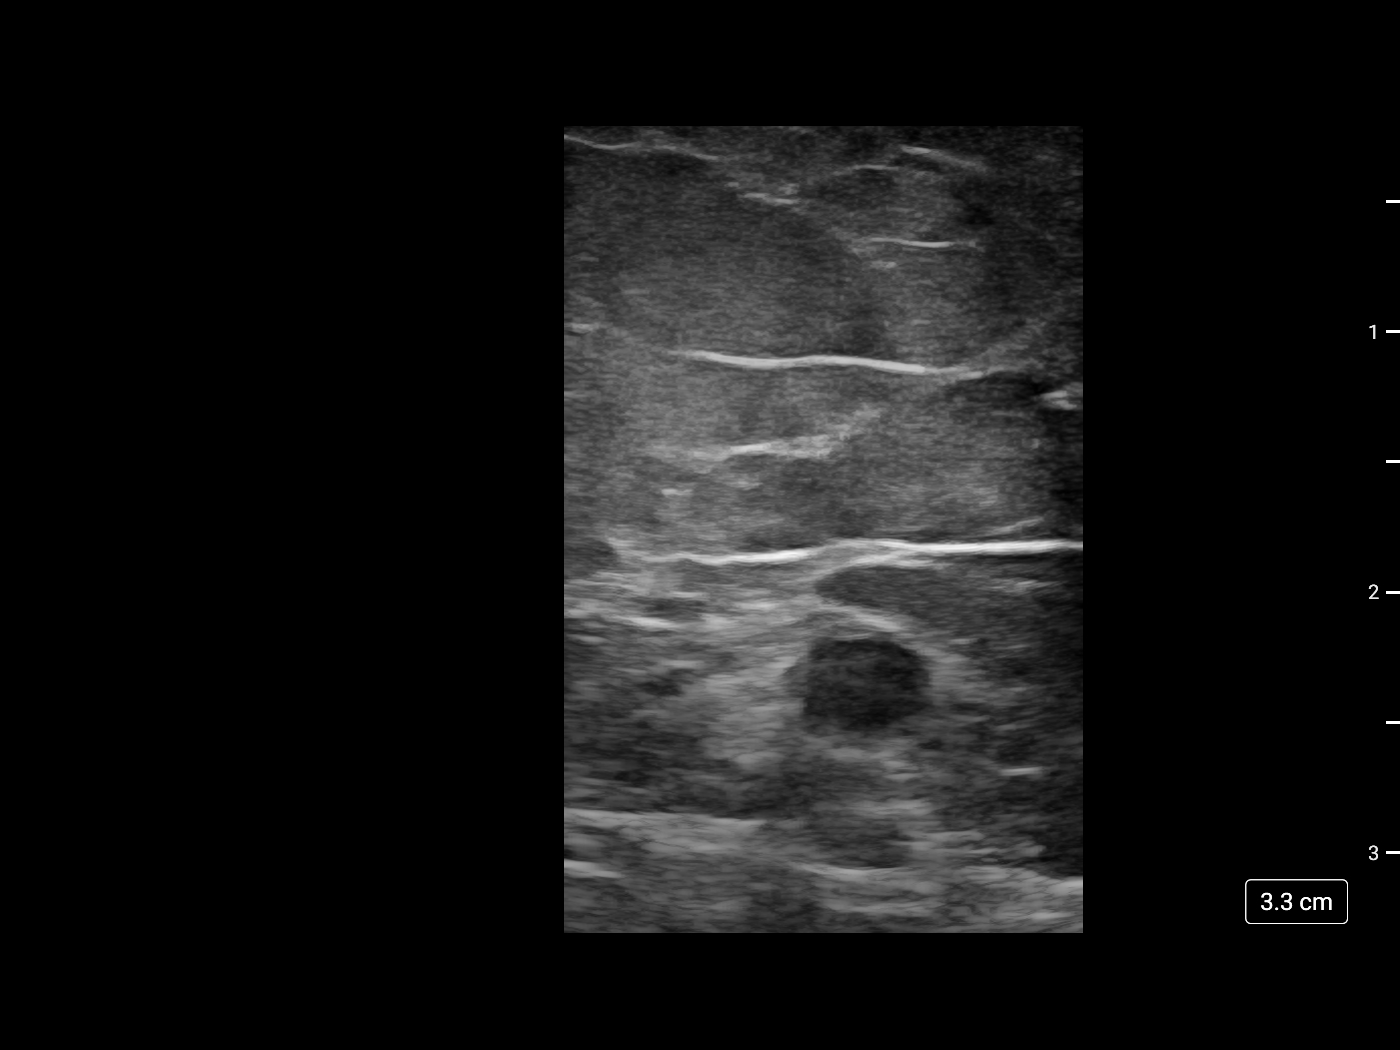

[Series 1: fl angio · 2 of 2 slices shown]
[im 1/2]
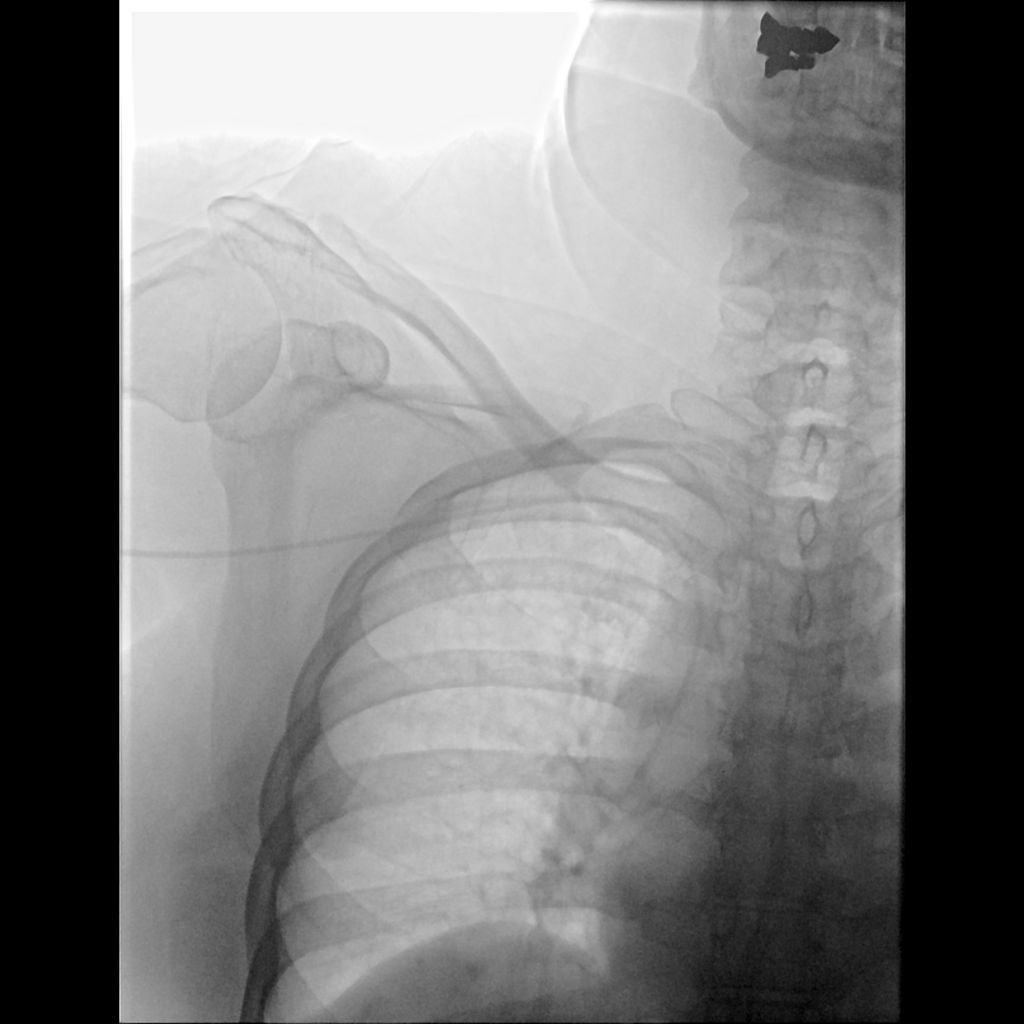
[im 2/2]
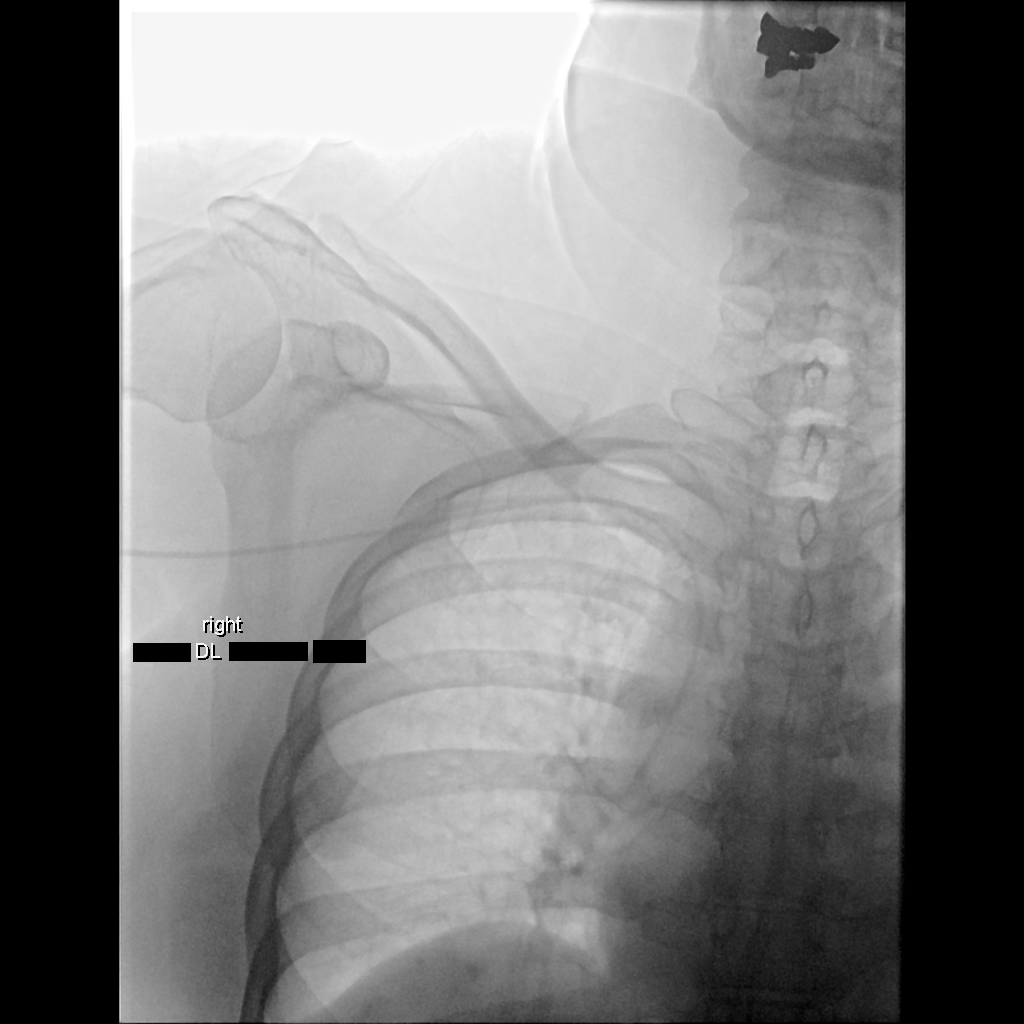

[3 of 3 positions shown; findings below may reference images not displayed]

EXAM:
RIGHT UPPER EXTREMITY PICC LINE PLACEMENT WITH ULTRASOUND AND
FLUOROSCOPIC GUIDANCE

MEDICATIONS:
4 mL 1% lidocaine

ANESTHESIA/SEDATION:
None.

FLUOROSCOPY TIME:  Fluoroscopy Time: 2 minutes 48 seconds (1 mGy).

COMPLICATIONS:
None immediate.

PROCEDURE:
The patient was advised of the possible risks and complications and
agreed to undergo the procedure. The patient was then brought to the
angiographic suite for the procedure.

The right arm was prepped with chlorhexidine, draped in the usual
sterile fashion using maximum barrier technique (cap and mask,
sterile gown, sterile gloves, large sterile sheet, hand hygiene and
cutaneous antisepsis) and infiltrated locally with 1% Lidocaine.

Ultrasound demonstrated patency of the right basilic vein, and this
was documented with an image. Under real-time ultrasound guidance,
this vein was accessed with a 21 gauge micropuncture needle and
image documentation was performed. A [DATE] wire was introduced in to
the vein. Over this, a 5 French double lumen power injectable PICC
was advanced to the lower SVC/right atrial junction. Fluoroscopy
during the procedure and fluoro spot radiograph confirms appropriate
catheter position. The catheter was flushed, aspirated, capped and
covered with a sterile dressing.

Catheter length: 36 cm
IMPRESSION: Successful right arm power PICC line placement with ultrasound and
fluoroscopic guidance. The catheter is ready for use.

Read by OLED

## 2020-09-20 MED ORDER — LIDOCAINE HCL (PF) 1 % IJ SOLN
INTRAMUSCULAR | Status: AC
  Filled 2020-09-20: qty 30

## 2020-09-20 MED ORDER — DOCUSATE SODIUM 100 MG PO CAPS
100.0000 mg | ORAL_CAPSULE | Freq: Two times a day (BID) | ORAL | Status: DC
  Administered 2020-09-20 – 2020-09-24 (×8): 100 mg via ORAL
  Filled 2020-09-20 (×8): qty 1

## 2020-09-20 MED ORDER — SODIUM CHLORIDE 0.9% FLUSH
10.0000 mL | INTRAVENOUS | Status: DC | PRN
  Administered 2020-09-24: 20 mL

## 2020-09-20 MED ORDER — CHLORHEXIDINE GLUCONATE CLOTH 2 % EX PADS
6.0000 | MEDICATED_PAD | Freq: Every day | CUTANEOUS | Status: DC
  Administered 2020-09-20 – 2020-09-24 (×5): 6 via TOPICAL

## 2020-09-20 MED ORDER — LIDOCAINE HCL (PF) 1 % IJ SOLN
INTRAMUSCULAR | Status: DC | PRN
  Administered 2020-09-20: 5 mL

## 2020-09-20 NOTE — Progress Notes (Signed)
PT Cancellation Note  Patient Details Name: Brittany Silva MRN: [id number] DOB: June 04, 1956   Cancelled Treatment:    Reason Eval/Treat Not Completed: (P) Patient at procedure or test/unavailable Pt off unit at IR dept. Will continue efforts per PT POC as schedule permits.   Daouda Lonzo M Helmuth Recupero 09/20/2020, 2:21 PM

## 2020-09-20 NOTE — Procedures (Signed)
PROCEDURE SUMMARY:  Successful placement of image-guided double lumen PICC line to the right basilic vein. Length 36 cm. Tip at lower SVC/RA. No complications. EBL = 3 mL. PICC flushed, aspirated, capped and dressed. Ready for use.  Please see imaging section of Epic for full dictation.   Joaquim Nam PA-C 09/20/2020 1:55 PM

## 2020-09-20 NOTE — Telephone Encounter (Signed)
Dr. Dwyane Dee called from [facility] with a hospital consult. Patient has osteomyelitis of the left 4th toe.

## 2020-09-20 NOTE — Consult Note (Signed)
  Subjective:  Patient ID: Brittany Silva, female    DOB: 1956/01/09,  MRN: [id number]  A 64 y.o. female history of chronic back pain, DM-2, HTN, hypothyroidism, morbid obesity and recent UTI presenting with 5 days hx. of lower back pain, bilateral leg pain, nausea, vomiting, fever and chills presents with left fourth digit infection.  Patient states that this has been going on for past 1 week has progressive gotten worse.  Patient states that she has not seen anyone else prior to seeing me for this.  She has been dealing with this for quite long time.  She also has lumbar epidural abscesses for which neurosurgery is following.  Infectious disease is also following for recommendations for antibiotics.  Objective:   Vitals:   09/19/20 2027 09/20/20 0811  BP: (!) 125/54 (!) 116/46  Pulse: 85 84  Resp: 16 17  Temp: 98.5 F (36.9 C) 98.1 F (36.7 C)  SpO2: 95% 96%   General AA&O x3. Normal mood and affect.  Vascular Dorsalis pedis and posterior tibial pulses 2/4 bilat. Brisk capillary refill to all digits. Pedal hair present.  Neurologic Epicritic sensation grossly intact.  Dermatologic  left fourth digit distal tip ulceration with underlying osteomyelitis of the distal phalanx.  The ulcer is fibrogranular probing down to bone.  No redness noted.  No purulent drainage was expressed.  Orthopedic: MMT 5/5 in dorsiflexion, plantarflexion, inversion, and eversion. Normal joint ROM without pain or crepitus.  ] 1. Acute osteomyelitis of the distal phalanx of the left fourth toe. 2. Small plantar skin ulceration with underlying fluid collection measuring 10 x 7 x 9 mm adjacent to the fourth toe distal phalanx. 3. Nondisplaced fractures involving the subchondral aspects of the second and third metatarsal heads with surrounding marrow edema, likely acute to subacute. Findings could be posttraumatic or secondary to osteonecrosis. 4. 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  Assessment & Plan:  Patient was evaluated and treated and all questions answered.  Left fourth digit ulceration with underlying osteomyelitis of the distal phalanx -All questions and concerns were addressed. -I discussed with the patient extensive detail that she will benefit from a surgical amputation of the distal phalanx and the distal part of the fourth digit in order to achieve surgical cure.  Patient agrees with the plan would like to proceed with amputation of the fourth toe partially.  I discussed with her that she will be weightbearing as tolerated with surgical shoe. -Occupational Therapy consulted for surgical shoe to the left -Plan none taking her to the operating room tomorrow at lunchtime for left fourth digit amputation -N.p.o. after midnight -Local wound care with Betadine wet-to-dry dressing changes  Felipa Furnace, DPM  Accessible via secure chat for questions or concerns.

## 2020-09-20 NOTE — Progress Notes (Signed)
Spoke with Karsten Fells, RN regarding order for PICC.  Per chart patient is an IR only placement.  Karsten Fells states that patient wants to go to IR due to difficulty with placement in the past as well.  No current order for IR placement, Britney to follow up and request order.

## 2020-09-20 NOTE — Progress Notes (Addendum)
PROGRESS NOTE    SHIANNE ZEISER  EVO:[id number] DOB: Aug 12, 1956 DOA: 09/11/2020 PCP: Carol Ada, MD   Brief Narrative: 64 year old female with history of chronic back pain, DM-2, HTN, hypothyroidism, morbid obesity and recent UTI presenting with 5 days hx. of lower back pain, bilateral leg pain, nausea, vomiting, fever and chills. MRI lumbar spine concerning for lumbar epidural abscess around the L5-S1 disc space and soft tissue enhancement on the right posterior to the L4-5 and L5-S1 facet joints.  Neurosurgery consulted, and recommended transfer to [facility].  Concerned that a diabetic foot ulcer in her left fourth toe could be source of infection. She also has hardwares in her left foot.  The next day, evaluated by neurosurgery who recommended IR biopsy for tissue diagnosis and ID consult for antibiotics. Patient underwent fluoroscopy guided needle aspiration.  Tissue culture sent.  Started on IV vancomycin, cefepime and Flagyl which was deescalated to vancomycin and ceftriaxone, then to IV penicillin G.  MRI left foot concerning for Charcot arthropathy versus osteomyelitis but didn't include the toe with concern. Normal ABI but abnormal TBI.  Repeat MRI of left foot consistent with acute osteomyelitis.  Podiatry consulted awaiting recommendation. Patient is going to get PICC line today for prolonged IV antibiotics.   Assessment & Plan:   Active Problems:   Type 2 diabetes mellitus with hyperglycemia (HCC)   Abscess in epidural space of lumbar spine   Diabetic foot ulcer (HCC)   Ankle pain   Epidural abscess  Acute on chronic low back pain/radiculopathy sec. to L5-S1 epidural abscess/ L4-5 and L5-S1 facet joint infection.  - There are no signs of cauda equina syndrome. -Imaging and ABI as above.  TTE without significant finding.  CRP 23>> 17.  ESR 85>> 77.  Mild leukocytosis. -Neurosurgery consulted and recommended IR biopsy and ID consult for antibiotics. -Underwent fluoroscopy  guided needle aspiration of the facet joints. -Initiated Vancomycin, cefepime and Flagyl on 10/14-10/15 -Continued Vancomycin and ceftriaxone 10/15>> 10/17 -Surgical tissue cultures grew few Streptococcus mitis/oralis sensitive to penicillin - Started IV penicillin G 10/17>> may need PICC line.  ID to decide timing. -Pain control scheduled Tylenol and tramadol, and IV Dilaudid for breakthrough pain -Bowel regimen with as needed MiraLAX and Senokot-S -Repeat MRI left foot shows acute osteomyelitis of the distal phalanx of left fourth toe. -Podiatry consulted with follow-up recommendation. -PT/OT eval   DM-2 with hypo- and hyperglycemia: On Levemir 75 units twice daily and SSI.  A1c 8.6%. -Continue SSI-high.  -Increase NovoLog AC from 12 to 15 units -Continue Levemir 40 units twice daily -Continue atorvastatin.  Essential hypertension:  -Continue home amlodipine -Resume home losartan  Hypothyroidism -Continue home Synthroid  Hypokalemia-resolved.  Hyponatremia: Improved Na 132- 136 today  Acute cystitis-treated with IV ceftriaxone x1 and Keflex x3 days -Should be covered with antibiotics as above.  Morbid obesity Body mass index is 44.64 kg/m    DVT prophylaxis: SCDS Code Status: Full Family Communication: No one available at bedside. Disposition Plan:   Status is: Inpatient  Remains inpatient appropriate because:Inpatient level of care appropriate due to severity of illness   Dispo: The patient is from: Home              Anticipated d/c is to: Home              Anticipated d/c date is: 2 days              Patient currently is not medically stable to d/c.  Consultants:   Neurosurgery-signed off  IR-off  Infectious disease  Procedures: 10/14-fluoroscopy guided needle aspiration of facet joints  Antimicrobials:  Anti-infectives (From admission, onward)   Start     Dose/Rate Route Frequency Ordered Stop   09/17/20 1600  penicillin G potassium 12  Million Units in dextrose 5 % 500 mL continuous infusion        12 Million Units 41.7 mL/hr over 12 Hours Intravenous Every 12 hours 09/17/20 0959     09/15/20 1500  cefTRIAXone (ROCEPHIN) 2 g in sodium chloride 0.9 % 100 mL IVPB  Status:  Discontinued        2 g 200 mL/hr over 30 Minutes Intravenous Every 24 hours 09/15/20 1049 09/17/20 0959   09/15/20 0400  vancomycin (VANCOREADY) IVPB 1250 mg/250 mL  Status:  Discontinued        1,250 mg 166.7 mL/hr over 90 Minutes Intravenous Every 12 hours 09/14/20 1416 09/17/20 0959   09/14/20 1500  vancomycin (VANCOCIN) 2,500 mg in sodium chloride 0.9 % 500 mL IVPB        2,500 mg 250 mL/hr over 120 Minutes Intravenous  Once 09/14/20 1410 09/14/20 1901   09/14/20 1430  metroNIDAZOLE (FLAGYL) IVPB 500 mg  Status:  Discontinued        500 mg 100 mL/hr over 60 Minutes Intravenous Every 8 hours 09/14/20 1358 09/15/20 1343   09/14/20 1430  ceFEPIme (MAXIPIME) 2 g in sodium chloride 0.9 % 100 mL IVPB  Status:  Discontinued        2 g 200 mL/hr over 30 Minutes Intravenous Every 8 hours 09/14/20 1410 09/15/20 1049     Subjective: Patient was seen and examined at bedside.  She reports pain is better controlled. She slept well overnight. She reports has not had a bowel movement in last 1 week,  asks for something stronger to have a bowel movement.  She denies any nausea and vomiting.  Objective: Vitals:   09/19/20 0805 09/19/20 1536 09/19/20 2027 09/20/20 0811  BP: (!) 155/64 139/61 (!) 125/54 (!) 116/46  Pulse: 72 89 85 84  Resp: '17 17 16 17  ' Temp: 98.2 F (36.8 C) 98.1 F (36.7 C) 98.5 F (36.9 C) 98.1 F (36.7 C)  TempSrc: Oral Oral Oral Oral  SpO2: 98% 93% 95% 96%  Weight:      Height:        Intake/Output Summary (Last 24 hours) at 09/20/2020 1221 Last data filed at 09/19/2020 2100 Gross per 24 hour  Intake 240 ml  Output --  Net 240 ml   Filed Weights   09/13/20 0739  Weight: 129.3 kg    Examination:  General exam: Appears  calm and comfortable  Respiratory system: Clear to auscultation. Respiratory effort normal. Cardiovascular system: S1 & S2 heard, RRR. No JVD, murmurs, rubs, gallops or clicks. No pedal edema. Gastrointestinal system: Abdomen is nondistended, soft and nontender. No organomegaly or masses felt. Normal bowel sounds heard. Central nervous system: Alert and oriented. No focal neurological deficits. Extremities: No edema no cyanosis no clubbing.  There is a small dry scab over the tip of the left fourth toe. Skin: No rashes, lesions or ulcers Psychiatry: Judgement and insight appear normal. Mood & affect appropriate.     Data Reviewed: I have personally reviewed following labs and imaging studies  CBC: Recent Labs  Lab 09/13/20 1525 09/13/20 1525 09/14/20 0106 09/15/20 0322 09/16/20 0155 09/18/20 0359 09/20/20 0210  WBC 9.7   < > 10.8* 11.1* 11.5* 11.3* 8.4  NEUTROABS 7.7  --  8.6*  --  9.1*  --  6.3  HGB 12.3   < > 12.3 11.4* 11.7* 12.1 11.5*  HCT 36.5   < > 36.9 35.0* 35.9* 36.5 34.9*  MCV 85.9   < > 86.2 86.6 89.1 87.7 87.7  PLT 378   < > 417* 413* 407* 425* 424*   < > = values in this interval not displayed.   Basic Metabolic Panel: Recent Labs  Lab 09/13/20 1525 09/13/20 1525 09/14/20 0106 09/15/20 0322 09/16/20 0155 09/18/20 0359 09/19/20 0253 09/20/20 0210  NA 131*  --   --  131* 132* 132*  --  136  K 4.1  --   --  3.9 3.6 4.1  --  3.8  CL 95*  --   --  97* 97* 97*  --  98  CO2 24  --   --  '23 26 24  ' --  27  GLUCOSE 219*  --   --  279* 230* 353*  --  109*  BUN 8  --   --  '15 16 12  ' --  8  CREATININE 0.72   < >  --  0.77 0.74 0.63 0.59 0.59  CALCIUM 8.1*  --   --  8.0* 8.4* 8.4*  --  8.8*  MG 2.1   < > 2.1 2.3 2.1 2.0  --  1.9  PHOS 2.1*  --   --  3.3 3.5 2.9  --  3.6   < > = values in this interval not displayed.   GFR: Estimated Creatinine Clearance: 99.5 mL/min (by C-G formula based on SCr of 0.59 mg/dL). Liver Function Tests: Recent Labs  Lab  09/13/20 1525 09/15/20 0322 09/16/20 0155 09/18/20 0359 09/20/20 0210  ALBUMIN 2.4* 2.3* 2.2* 2.3* 2.3*   No results for input(s): LIPASE, AMYLASE in the last 168 hours. No results for input(s): AMMONIA in the last 168 hours. Coagulation Profile: Recent Labs  Lab 09/14/20 0106  INR 1.1   Cardiac Enzymes: No results for input(s): CKTOTAL, CKMB, CKMBINDEX, TROPONINI in the last 168 hours. BNP (last 3 results) No results for input(s): PROBNP in the last 8760 hours. HbA1C: No results for input(s): HGBA1C in the last 72 hours. CBG: Recent Labs  Lab 09/19/20 1139 09/19/20 1708 09/19/20 2029 09/20/20 0636 09/20/20 1217  GLUCAP 197* 283* 176* 126* 142*   Lipid Profile: No results for input(s): CHOL, HDL, LDLCALC, TRIG, CHOLHDL, LDLDIRECT in the last 72 hours. Thyroid Function Tests: No results for input(s): TSH, T4TOTAL, FREET4, T3FREE, THYROIDAB in the last 72 hours. Anemia Panel: No results for input(s): VITAMINB12, FOLATE, FERRITIN, TIBC, IRON, RETICCTPCT in the last 72 hours. Sepsis Labs: No results for input(s): PROCALCITON, LATICACIDVEN in the last 168 hours.  Recent Results (from the past 240 hour(s))  Respiratory Panel by RT PCR (Flu A&B, Covid) - Nasopharyngeal Swab     Status: None   Collection Time: 09/12/20  1:43 PM   Specimen: Nasopharyngeal Swab  Result Value Ref Range Status   SARS Coronavirus 2 by RT PCR NEGATIVE NEGATIVE Final    Comment: (NOTE) SARS-CoV-2 target nucleic acids are NOT DETECTED.  The SARS-CoV-2 RNA is generally detectable in upper respiratoy specimens during the acute phase of infection. The lowest concentration of SARS-CoV-2 viral copies this assay can detect is 131 copies/mL. A negative result does not preclude SARS-Cov-2 infection and should not be used as the sole basis for treatment or other patient management decisions. A negative result may  occur with  improper specimen collection/handling, submission of specimen other than  nasopharyngeal swab, presence of viral mutation(s) within the areas targeted by this assay, and inadequate number of viral copies (<131 copies/mL). A negative result must be combined with clinical observations, patient history, and epidemiological information. The expected result is Negative.  Fact Sheet for Patients:  PinkCheek.be  Fact Sheet for Healthcare Providers:  GravelBags.it  This test is no t yet approved or cleared by the [place] FDA and  has been authorized for detection and/or diagnosis of SARS-CoV-2 by FDA under an Emergency Use Authorization (EUA). This EUA will remain  in effect (meaning this test can be used) for the duration of the COVID-19 declaration under Section 564(b)(1) of the Act, 21 U.S.C. section 360bbb-3(b)(1), unless the authorization is terminated or revoked sooner.     Influenza A by PCR NEGATIVE NEGATIVE Final   Influenza B by PCR NEGATIVE NEGATIVE Final    Comment: (NOTE) The Xpert Xpress SARS-CoV-2/FLU/RSV assay is intended as an aid in  the diagnosis of influenza from Nasopharyngeal swab specimens and  should not be used as a sole basis for treatment. Nasal washings and  aspirates are unacceptable for Xpert Xpress SARS-CoV-2/FLU/RSV  testing.  Fact Sheet for Patients: PinkCheek.be  Fact Sheet for Healthcare Providers: GravelBags.it  This test is not yet approved or cleared by the [place] FDA and  has been authorized for detection and/or diagnosis of SARS-CoV-2 by  FDA under an Emergency Use Authorization (EUA). This EUA will remain  in effect (meaning this test can be used) for the duration of the  Covid-19 declaration under Section 564(b)(1) of the Act, 21  U.S.C. section 360bbb-3(b)(1), unless the authorization is  terminated or revoked. Performed at [facility] [place] [address], [place], [place] [address]   Aerobic/Anaerobic Culture (surgical/deep wound)     Status: None   Collection Time: 09/14/20  1:52 PM   Specimen: Abscess; Synovial Fluid  Result Value Ref Range Status   Specimen Description ABSCESS  Final   Special Requests RIGHT L4 L5 JOINT  Final   Gram Stain   Final    ABUNDANT WBC PRESENT, PREDOMINANTLY PMN MODERATE GRAM POSITIVE COCCI IN PAIRS IN CHAINS    Culture   Final    FEW STREPTOCOCCUS MITIS/ORALIS NO ANAEROBES ISOLATED Performed at [facility], [address], [place], [place] [address]    Report Status 09/19/2020 FINAL  Final   Organism ID, Bacteria STREPTOCOCCUS MITIS/ORALIS  Final      Susceptibility   Streptococcus mitis/oralis - MIC*    TETRACYCLINE 0.5 SENSITIVE Sensitive     VANCOMYCIN 0.5 SENSITIVE Sensitive     CLINDAMYCIN 0.5 INTERMEDIATE Intermediate     PENICILLIN Value in next row Sensitive      SENSITIVE0.06    CEFTRIAXONE Value in next row Sensitive      SENSITIVE0.12    * FEW STREPTOCOCCUS MITIS/ORALIS    Radiology Studies: MR FOOT LEFT W WO CONTRAST  Result Date: 09/19/2020 CLINICAL DATA:  Diabetic foot ulcer EXAM: MRI OF THE LEFT FOREFOOT WITHOUT AND WITH CONTRAST TECHNIQUE: Multiplanar, multisequence MR imaging of the left forefoot was performed both before and after administration of intravenous contrast. CONTRAST:  58m GADAVIST GADOBUTROL 1 MMOL/ML IV SOLN COMPARISON:  X-ray 09/08/2020 FINDINGS: Technical note: Motion degraded examination. Bones/Joint/Cartilage Bone marrow edema throughout the distal phalanx of the left fourth toe with associated confluent low T1 bone marrow signal (series 9, images 11-13). Marrow edema extends to  the level of the proximal interphalangeal joint. The distal interphalangeal joint appears congenitally fused. No marrow edema within the fourth toe proximal phalanx. There are fractures involving the subchondral aspect of the second and third metatarsal heads with surrounding marrow  edema (series 7, image 14). 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 Elsewhere, included osseous structures of the forefoot are intact. Ligaments Intact Lisfranc ligament. No evidence of collateral ligament disruption. Muscles and Tendons Diffuse atrophy and fatty infiltration of the intrinsic foot musculature suggesting chronic denervation changes. Soft tissues Soft tissue swelling involving the distal aspect of the fourth toe. Suspect small plantar skin ulceration with underlying fluid collection measuring 10 x 7 x 9 mm adjacent to the fourth toe distal phalanx. IMPRESSION: 1. Acute osteomyelitis of the distal phalanx of the left fourth toe. 2. Small plantar skin ulceration with underlying fluid collection measuring 10 x 7 x 9 mm adjacent to the fourth toe distal phalanx. 3. Nondisplaced fractures involving the subchondral aspects of the second and third metatarsal heads with surrounding marrow edema, likely acute to subacute. Findings could be posttraumatic or secondary to osteonecrosis. 4. 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 Electronically Signed   By: Davina Poke D.O.   On: 09/19/2020 15:24   [place] EKG SITE RITE  Result Date: 09/19/2020 If Site Rite image not attached, placement could not be confirmed due to current cardiac rhythm.   Scheduled Meds: . acetaminophen  1,000 mg Oral Q8H  . amLODipine  10 mg Oral Daily  . atorvastatin  40 mg Oral Daily  . docusate sodium  100 mg Oral BID  . DULoxetine  90 mg Oral Daily  . enoxaparin (LOVENOX) injection  65 mg Subcutaneous Q24H  . insulin aspart  0-20 Units Subcutaneous TID WC  . insulin aspart  0-5 Units Subcutaneous QHS  . insulin aspart  15 Units Subcutaneous TID WC  . insulin detemir  40 Units Subcutaneous BID  . levothyroxine  75 mcg Oral Q0600  . losartan  50 mg Oral Daily  .  multivitamin with minerals  1 tablet Oral Daily  . Ensure Max Protein  11 oz Oral QHS  . senna-docusate  1 tablet Oral BID  . traMADol  50 mg Oral Q8H   Continuous Infusions: . penicillin g continuous IV infusion 12 Million Units (09/20/20 0442)     LOS: 8 days    Time spent: 35 mins    Tachina Spoonemore, MD [facility]   If 7PM-7AM, please contact night-coverage

## 2020-09-20 NOTE — Progress Notes (Signed)
[facility]  Date of Admission:  09/11/2020     Total days of antibiotics 7         ASSESSMENT:  Brittany Silva MRI shows osteomyelitis with underlying fluid collection of the left fourth toe.  This will likely require surgical intervention and primary team has contacted podiatry.  Back remains stable with pain adequately managed.  Will require outpatient antibiotic therapy.  Will need PICC line placement prior to discharge.  Plan for 6 weeks of penicillin G.  End date to be determined pending podiatry evaluation and any need for surgery.  Continue current dose of penicillin G.  PLAN:  1. Continue current dose of penicillin G. 2. PICC line placement prior to discharge. 3. Await podiatry evaluation of osteomyelitis with fluid collection.  Active Problems:   Type 2 diabetes mellitus with hyperglycemia (HCC)   Abscess in epidural space of lumbar spine   Diabetic foot ulcer (HCC)   Ankle pain   Epidural abscess   . acetaminophen  1,000 mg Oral Q8H  . amLODipine  10 mg Oral Daily  . atorvastatin  40 mg Oral Daily  . docusate sodium  100 mg Oral BID  . DULoxetine  90 mg Oral Daily  . enoxaparin (LOVENOX) injection  65 mg Subcutaneous Q24H  . insulin aspart  0-20 Units Subcutaneous TID WC  . insulin aspart  0-5 Units Subcutaneous QHS  . insulin aspart  15 Units Subcutaneous TID WC  . insulin detemir  40 Units Subcutaneous BID  . levothyroxine  75 mcg Oral Q0600  . losartan  50 mg Oral Daily  . multivitamin with minerals  1 tablet Oral Daily  . Ensure Max Protein  11 oz Oral QHS  . senna-docusate  1 tablet Oral BID  . traMADol  50 mg Oral Q8H    SUBJECTIVE:  Afebrile overnight with no acute events. MRI with acute osteomyelitis of the left fourth toe and small skin ulceration with underlying fluid collection measuring 10 x 7 x 9 mm and nondisplaced fractures involving the subchondral aspects of the second and third metatarsal heads with surrounding  marrow edema.  Allergies  Allergen Reactions  . Hydrochlorothiazide     rash  . Lisinopril     Other reaction(s): Cough  . Adhesive [Tape]   . Codeine Phosphate     REACTION: throat swelling  . Darvon Other (See Comments)    Jittering, skin problems  . Darvon [Propoxyphene Hcl]     Hyperactivity; "makes me crazy"  . Hydrocodone-Acetaminophen Itching    *Has tolerated hydromorphone as an adult* Per pt, when Brittany Silva was a child had throat itching, difficulty breathing and feel strange and also vomit   . Propoxyphene Hcl     REACTION: Makes her crazy  . Latex Rash  . Neosporin [Neomycin-Bacitracin Zn-Polymyx] Rash  . Sulfamethoxazole Itching and Rash     Review of Systems: Review of Systems  Constitutional: Negative for chills, fever and weight loss.  Respiratory: Negative for cough, shortness of breath and wheezing.   Cardiovascular: Negative for chest pain and leg swelling.  Gastrointestinal: Negative for abdominal pain, constipation, diarrhea, nausea and vomiting.  Musculoskeletal: Positive for back pain.  Skin: Negative for rash.      OBJECTIVE: Vitals:   09/19/20 0805 09/19/20 1536 09/19/20 2027 09/20/20 0811  BP: (!) 155/64 139/61 (!) 125/54 (!) 116/46  Pulse: 72 89 85 84  Resp: 17 17 16 17   Temp: 98.2 F (36.8 C) 98.1 F (36.7  C) 98.5 F (36.9 C) 98.1 F (36.7 C)  TempSrc: Oral Oral Oral Oral  SpO2: 98% 93% 95% 96%  Weight:      Height:       Body mass index is 44.64 kg/m.  Physical Exam Constitutional:      General: Brittany Silva is not in acute distress.    Appearance: Brittany Silva is well-developed. Brittany Silva is obese.  Cardiovascular:     Rate and Rhythm: Normal rate and regular rhythm.     Heart sounds: Normal heart sounds.  Pulmonary:     Effort: Pulmonary effort is normal.     Breath sounds: Normal breath sounds.  Skin:    General: Skin is warm and dry.  Neurological:     Mental Status: Brittany Silva is alert and oriented to person, place, and time.  Psychiatric:         Behavior: Behavior normal.        Thought Content: Thought content normal.        Judgment: Judgment normal.     Lab Results Lab Results  Component Value Date   WBC 8.4 09/20/2020   HGB 11.5 (L) 09/20/2020   HCT 34.9 (L) 09/20/2020   MCV 87.7 09/20/2020   PLT 424 (H) 09/20/2020    Lab Results  Component Value Date   CREATININE 0.59 09/20/2020   BUN 8 09/20/2020   NA 136 09/20/2020   K 3.8 09/20/2020   CL 98 09/20/2020   CO2 27 09/20/2020    Lab Results  Component Value Date   ALT 22 09/11/2020   AST 37 09/11/2020   ALKPHOS 128 (H) 09/11/2020   BILITOT 0.6 09/11/2020     Microbiology: Recent Results (from the past 240 hour(s))  Respiratory Panel by RT PCR (Flu A&B, Covid) - Nasopharyngeal Swab     Status: None   Collection Time: 09/12/20  1:43 PM   Specimen: Nasopharyngeal Swab  Result Value Ref Range Status   SARS Coronavirus 2 by RT PCR NEGATIVE NEGATIVE Final    Comment: (NOTE) SARS-CoV-2 target nucleic acids are NOT DETECTED.  The SARS-CoV-2 RNA is generally detectable in upper respiratoy specimens during the acute phase of infection. The lowest concentration of SARS-CoV-2 viral copies this assay can detect is 131 copies/mL. A negative result does not preclude SARS-Cov-2 infection and should not be used as the sole basis for treatment or other patient management decisions. A negative result may occur with  improper specimen collection/handling, submission of specimen other than nasopharyngeal swab, presence of viral mutation(s) within the areas targeted by this assay, and inadequate number of viral copies (<131 copies/mL). A negative result must be combined with clinical observations, patient history, and epidemiological information. The expected result is Negative.  Fact Sheet for Patients:  PinkCheek.be  Fact Sheet for Healthcare Providers:  GravelBags.it  This test is no t yet approved or  cleared by the [place] FDA and  has been authorized for detection and/or diagnosis of SARS-CoV-2 by FDA under an Emergency Use Authorization (EUA). This EUA will remain  in effect (meaning this test can be used) for the duration of the COVID-19 declaration under Section 564(b)(1) of the Act, 21 U.S.C. section 360bbb-3(b)(1), unless the authorization is terminated or revoked sooner.     Influenza A by PCR NEGATIVE NEGATIVE Final   Influenza B by PCR NEGATIVE NEGATIVE Final    Comment: (NOTE) The Xpert Xpress SARS-CoV-2/FLU/RSV assay is intended as an aid in  the diagnosis of influenza from Nasopharyngeal swab specimens and  should not be used as a sole basis for treatment. Nasal washings and  aspirates are unacceptable for Xpert Xpress SARS-CoV-2/FLU/RSV  testing.  Fact Sheet for Patients: PinkCheek.be  Fact Sheet for Healthcare Providers: GravelBags.it  This test is not yet approved or cleared by the [place] FDA and  has been authorized for detection and/or diagnosis of SARS-CoV-2 by  FDA under an Emergency Use Authorization (EUA). This EUA will remain  in effect (meaning this test can be used) for the duration of the  Covid-19 declaration under Section 564(b)(1) of the Act, 21  U.S.C. section 360bbb-3(b)(1), unless the authorization is  terminated or revoked. Performed at [facility], [place] [address], [place], [place] [address]   Aerobic/Anaerobic Culture (surgical/deep wound)     Status: None   Collection Time: 09/14/20  1:52 PM   Specimen: Abscess; Synovial Fluid  Result Value Ref Range Status   Specimen Description ABSCESS  Final   Special Requests RIGHT L4 L5 JOINT  Final   Gram Stain   Final    ABUNDANT WBC PRESENT, PREDOMINANTLY PMN MODERATE GRAM POSITIVE COCCI IN PAIRS IN CHAINS    Culture   Final    FEW STREPTOCOCCUS MITIS/ORALIS NO ANAEROBES ISOLATED Performed at [facility], [address] [place], [place] [address]    Report Status 09/19/2020 FINAL  Final   Organism ID, Bacteria STREPTOCOCCUS MITIS/ORALIS  Final      Susceptibility   Streptococcus mitis/oralis - MIC*    TETRACYCLINE 0.5 SENSITIVE Sensitive     VANCOMYCIN 0.5 SENSITIVE Sensitive     CLINDAMYCIN 0.5 INTERMEDIATE Intermediate     PENICILLIN Value in next row Sensitive      SENSITIVE0.06    CEFTRIAXONE Value in next row Sensitive      SENSITIVE0.12    * FEW STREPTOCOCCUS MITIS/ORALIS     Terri Piedra, NP [facility]  09/20/2020  12:31 PM

## 2020-09-21 ENCOUNTER — Inpatient Hospital Stay (HOSPITAL_COMMUNITY): Payer: 59

## 2020-09-21 ENCOUNTER — Inpatient Hospital Stay (HOSPITAL_COMMUNITY): Payer: 59 | Admitting: Certified Registered"

## 2020-09-21 ENCOUNTER — Encounter (HOSPITAL_COMMUNITY): Payer: Self-pay | Admitting: Internal Medicine

## 2020-09-21 ENCOUNTER — Encounter (HOSPITAL_COMMUNITY)
Admission: EM | Disposition: A | Discharge status: Home Health | Payer: Self-pay | Source: Home / Self Care | Attending: Student

## 2020-09-21 DIAGNOSIS — Z89431 Acquired absence of right foot: Secondary | ICD-10-CM

## 2020-09-21 DIAGNOSIS — M86672 Other chronic osteomyelitis, left ankle and foot: Secondary | ICD-10-CM

## 2020-09-21 DIAGNOSIS — G061 Intraspinal abscess and granuloma: Secondary | ICD-10-CM | POA: Diagnosis not present

## 2020-09-21 HISTORY — PX: APPLICATION OF A-CELL OF EXTREMITY: SHX6303

## 2020-09-21 HISTORY — PX: AMPUTATION: SHX166

## 2020-09-21 LAB — CBC
HCT: 34.7 % — ABNORMAL LOW (ref 36.0–46.0)
Hemoglobin: 11.6 g/dL — ABNORMAL LOW (ref 12.0–15.0)
MCH: 29.3 pg (ref 26.0–34.0)
MCHC: 33.4 g/dL (ref 30.0–36.0)
MCV: 87.6 fL (ref 80.0–100.0)
Platelets: 397 10*3/uL (ref 150–400)
RBC: 3.96 MIL/uL (ref 3.87–5.11)
RDW: 13.9 % (ref 11.5–15.5)
WBC: 8.6 10*3/uL (ref 4.0–10.5)
nRBC: 0 % (ref 0.0–0.2)

## 2020-09-21 LAB — BASIC METABOLIC PANEL
Anion gap: 10 (ref 5–15)
BUN: 10 mg/dL (ref 8–23)
CO2: 25 mmol/L (ref 22–32)
Calcium: 8.7 mg/dL — ABNORMAL LOW (ref 8.9–10.3)
Chloride: 95 mmol/L — ABNORMAL LOW (ref 98–111)
Creatinine, Ser: 0.66 mg/dL (ref 0.44–1.00)
GFR, Estimated: >60 mL/min (ref >60)
Glucose, Bld: 187 mg/dL — ABNORMAL HIGH (ref 70–99)
Potassium: 3.9 mmol/L (ref 3.5–5.1)
Sodium: 130 mmol/L — ABNORMAL LOW (ref 135–145)

## 2020-09-21 LAB — GLUCOSE, CAPILLARY
Glucose-Capillary: 174 mg/dL — ABNORMAL HIGH (ref 70–99)
Glucose-Capillary: 184 mg/dL — ABNORMAL HIGH (ref 70–99)
Glucose-Capillary: 153 mg/dL — ABNORMAL HIGH (ref 70–99)
Glucose-Capillary: 244 mg/dL — ABNORMAL HIGH (ref 70–99)
Glucose-Capillary: 346 mg/dL — ABNORMAL HIGH (ref 70–99)

## 2020-09-21 LAB — MAGNESIUM: Magnesium: 1.8 mg/dL (ref 1.7–2.4)

## 2020-09-21 LAB — PHOSPHORUS: Phosphorus: 2.9 mg/dL (ref 2.5–4.6)

## 2020-09-21 IMAGING — DX DG FOOT COMPLETE 3+V*L*
2 series · 3 of 3 positions shown · non-contrast
Comparison: None.

CLINICAL DATA: Status post fourth toe amputation.

EXAM:
LEFT FOOT - COMPLETE 3+ VIEW

[Series 1: foot · 0.14mm/px · 2 of 2 slices shown]
[im 1/2]
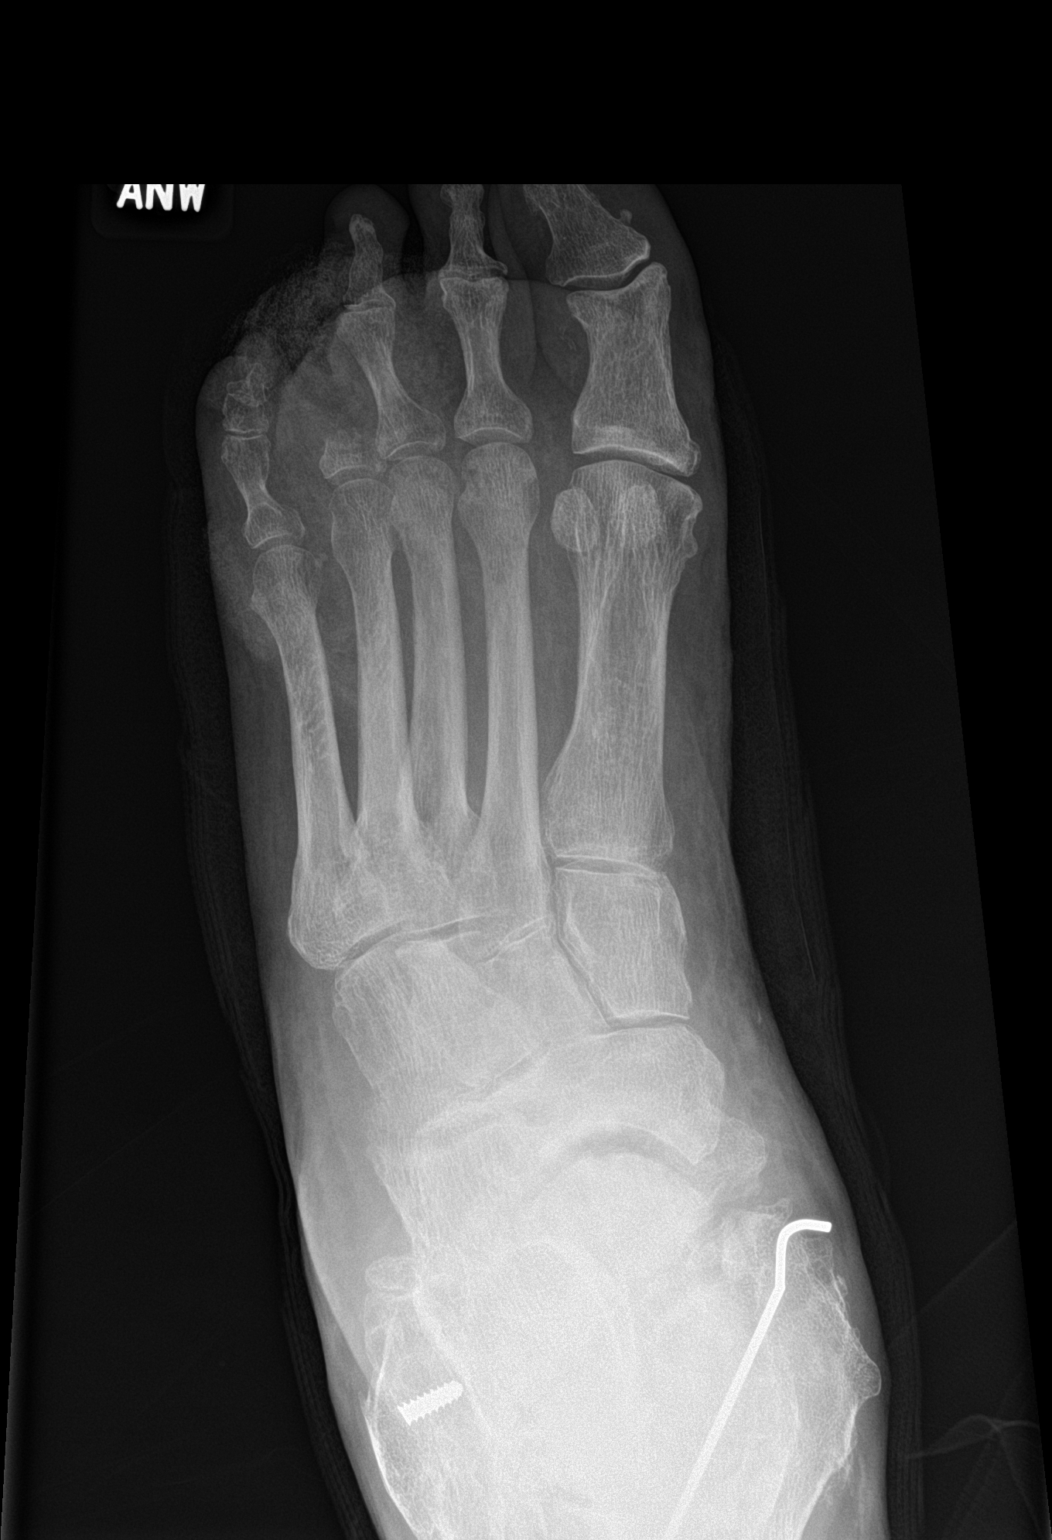
[im 2/2]
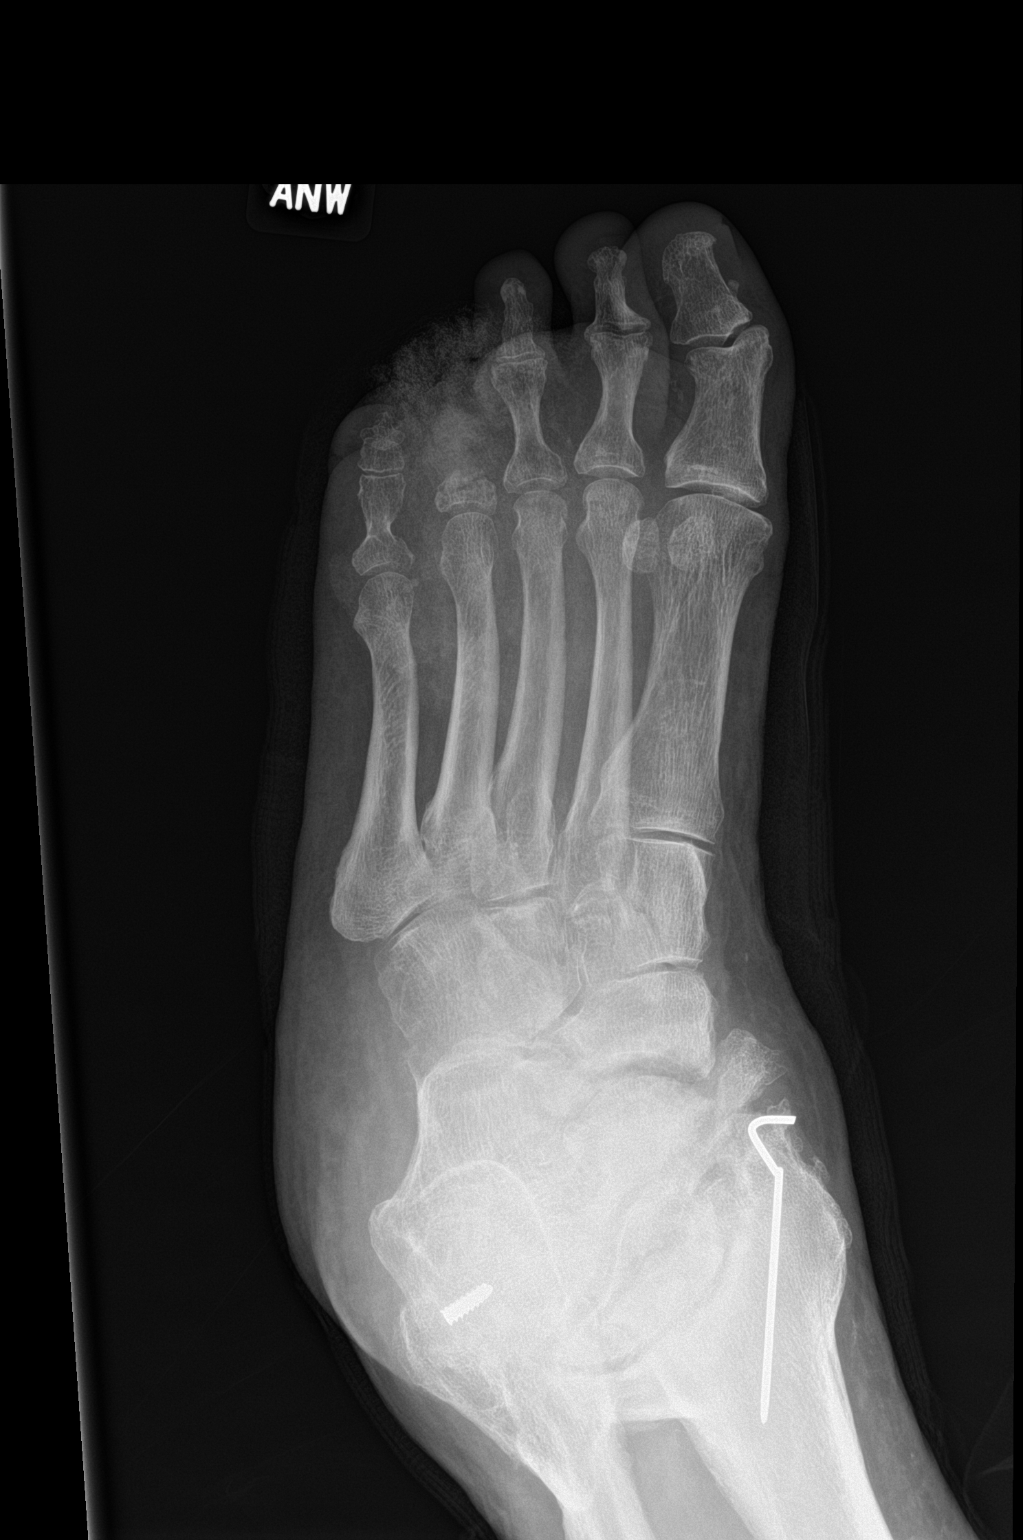

[leg]
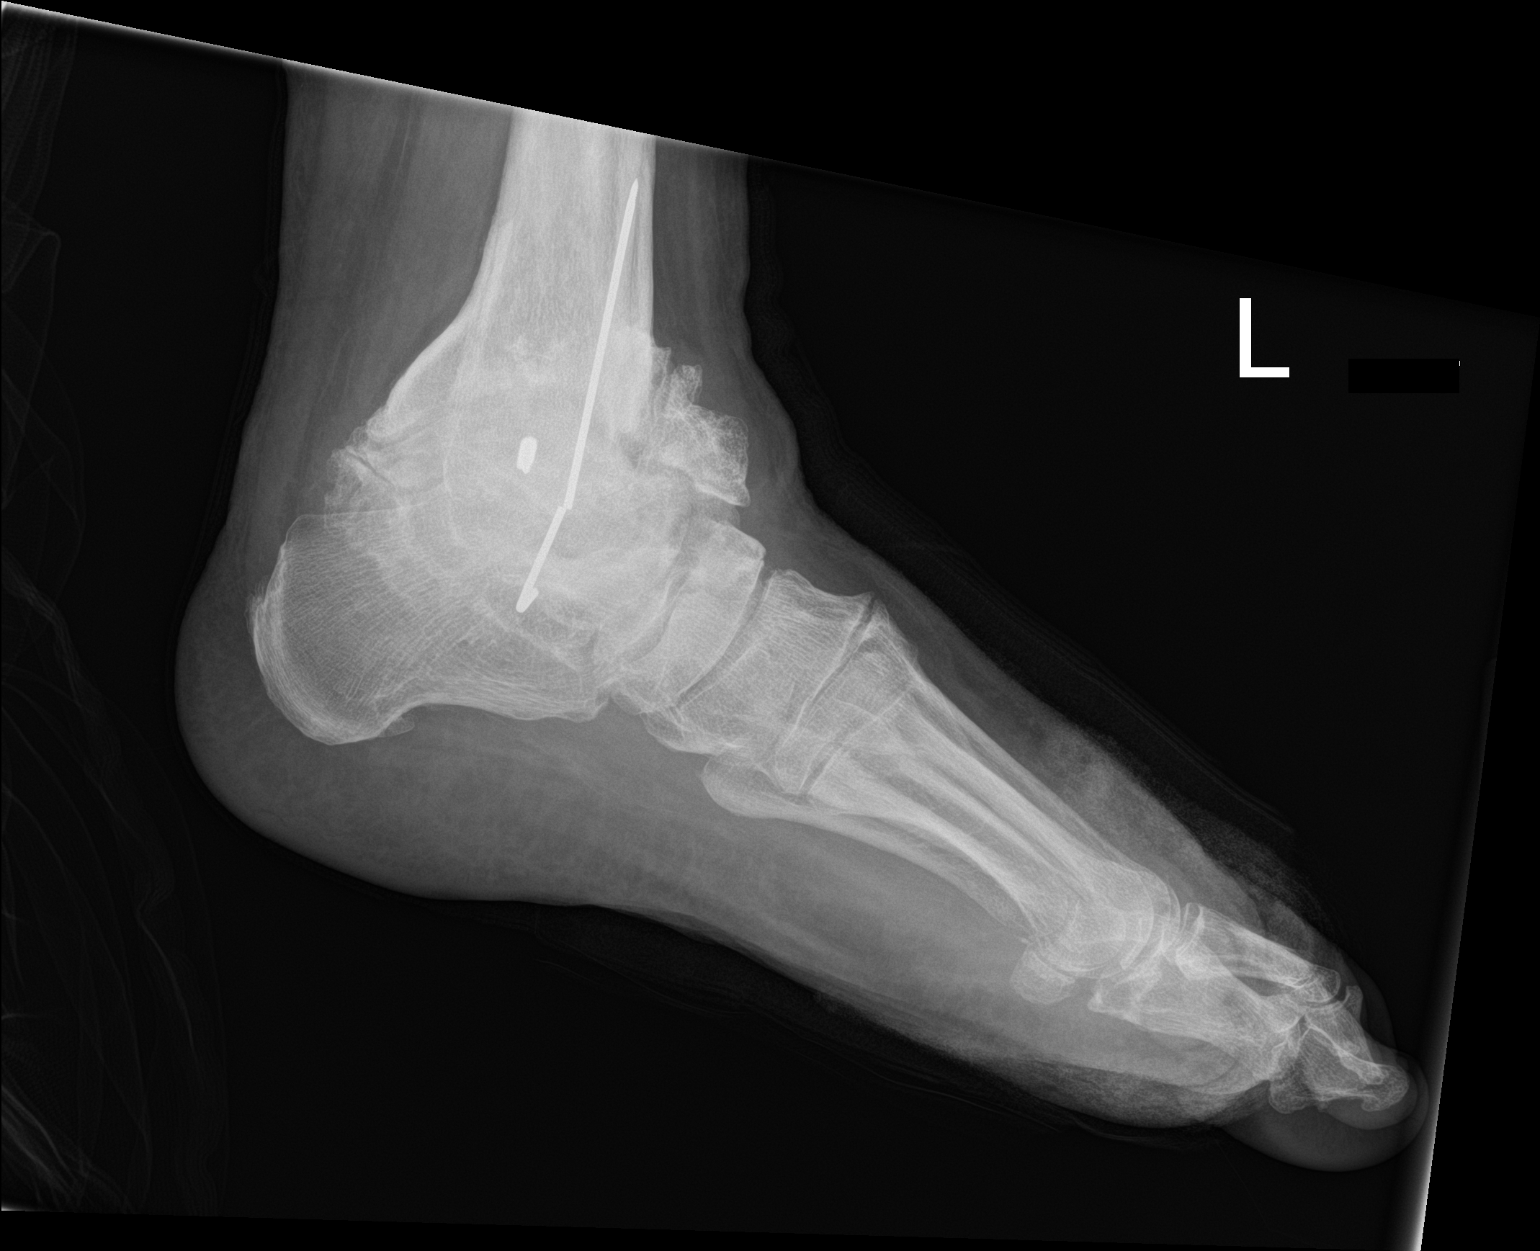

[3 of 3 positions shown; findings below may reference images not displayed]

FINDINGS: Postsurgical changes are seen involving the distal tibia and fibula.
Severe degenerative changes seen involving the talotibial joint.
Status post amputation of the fourth toe at the level of the
proximal base of the fourth proximal phalanx. No other fracture or
dislocation is noted.
IMPRESSION: Status post amputation of the fourth toe at the level of the
proximal base of the fourth proximal phalanx. Severe degenerative
joint disease of the talotibial joint.

## 2020-09-21 SURGERY — AMPUTATION, FOOT, PARTIAL
Anesthesia: General | Site: Foot | Laterality: Left

## 2020-09-21 MED ORDER — ENOXAPARIN SODIUM 80 MG/0.8ML ~~LOC~~ SOLN
65.0000 mg | SUBCUTANEOUS | Status: DC
  Administered 2020-09-21 – 2020-09-23 (×3): 65 mg via SUBCUTANEOUS
  Filled 2020-09-21 (×5): qty 0.65

## 2020-09-21 MED ORDER — LIDOCAINE 1 % OPTIME INJ - NO CHARGE
INTRAMUSCULAR | Status: DC | PRN
  Administered 2020-09-21: 5 mL via INTRADERMAL

## 2020-09-21 MED ORDER — ACETAMINOPHEN 500 MG PO TABS: 1000.0000 mg | ORAL_TABLET | Freq: Once | ORAL | Status: AC

## 2020-09-21 MED ORDER — PHENYLEPHRINE HCL (PRESSORS) 10 MG/ML IV SOLN
INTRAVENOUS | Status: DC | PRN
  Administered 2020-09-21: 80 ug via INTRAVENOUS

## 2020-09-21 MED ORDER — LACTATED RINGERS IV SOLN: INTRAVENOUS | Status: DC

## 2020-09-21 MED ORDER — MIDAZOLAM HCL 2 MG/2ML IJ SOLN
INTRAMUSCULAR | Status: AC
  Filled 2020-09-21: qty 2

## 2020-09-21 MED ORDER — BUPIVACAINE HCL 0.25 % IJ SOLN
INTRAMUSCULAR | Status: DC | PRN
  Administered 2020-09-21: 5 mL

## 2020-09-21 MED ORDER — DEXAMETHASONE SODIUM PHOSPHATE 10 MG/ML IJ SOLN
INTRAMUSCULAR | Status: DC | PRN
  Administered 2020-09-21: 10 mg via INTRAVENOUS

## 2020-09-21 MED ORDER — LIDOCAINE HCL (CARDIAC) PF 100 MG/5ML IV SOSY
PREFILLED_SYRINGE | INTRAVENOUS | Status: DC | PRN
  Administered 2020-09-21: 100 mg via INTRATRACHEAL

## 2020-09-21 MED ORDER — ONDANSETRON HCL 4 MG/2ML IJ SOLN
INTRAMUSCULAR | Status: DC | PRN
  Administered 2020-09-21: 4 mg via INTRAVENOUS

## 2020-09-21 MED ORDER — PROPOFOL 10 MG/ML IV BOLUS
INTRAVENOUS | Status: DC | PRN
  Administered 2020-09-21: 150 mg via INTRAVENOUS

## 2020-09-21 MED ORDER — LACTATED RINGERS IV SOLN: INTRAVENOUS | Status: DC | PRN

## 2020-09-21 MED ORDER — CHLORHEXIDINE GLUCONATE 0.12 % MT SOLN
OROMUCOSAL | Status: AC
  Administered 2020-09-21: 15 mL via OROMUCOSAL
  Filled 2020-09-21: qty 15

## 2020-09-21 MED ORDER — PHENYLEPHRINE 40 MCG/ML (10ML) SYRINGE FOR IV PUSH (FOR BLOOD PRESSURE SUPPORT)
PREFILLED_SYRINGE | INTRAVENOUS | Status: AC
  Filled 2020-09-21: qty 10

## 2020-09-21 MED ORDER — ONDANSETRON HCL 4 MG/2ML IJ SOLN
INTRAMUSCULAR | Status: AC
  Filled 2020-09-21: qty 2

## 2020-09-21 MED ORDER — BUPIVACAINE HCL (PF) 0.25 % IJ SOLN
INTRAMUSCULAR | Status: AC
  Filled 2020-09-21: qty 30

## 2020-09-21 MED ORDER — DEXAMETHASONE SODIUM PHOSPHATE 10 MG/ML IJ SOLN
INTRAMUSCULAR | Status: AC
  Filled 2020-09-21: qty 1

## 2020-09-21 MED ORDER — FENTANYL CITRATE (PF) 250 MCG/5ML IJ SOLN
INTRAMUSCULAR | Status: AC
  Filled 2020-09-21: qty 5

## 2020-09-21 MED ORDER — LIDOCAINE HCL (PF) 1 % IJ SOLN
INTRAMUSCULAR | Status: AC
  Filled 2020-09-21: qty 30

## 2020-09-21 MED ORDER — 0.9 % SODIUM CHLORIDE (POUR BTL) OPTIME
TOPICAL | Status: DC | PRN
  Administered 2020-09-21: 1000 mL

## 2020-09-21 MED ORDER — INSULIN ASPART 100 UNIT/ML ~~LOC~~ SOLN
0.0000 [IU] | Freq: Three times a day (TID) | SUBCUTANEOUS | Status: DC
  Administered 2020-09-21 – 2020-09-22 (×2): 7 [IU] via SUBCUTANEOUS
  Administered 2020-09-22 (×2): 20 [IU] via SUBCUTANEOUS
  Administered 2020-09-23: 3 [IU] via SUBCUTANEOUS
  Administered 2020-09-23: 7 [IU] via SUBCUTANEOUS
  Administered 2020-09-23: 11 [IU] via SUBCUTANEOUS
  Administered 2020-09-24: 4 [IU] via SUBCUTANEOUS
  Administered 2020-09-24: 7 [IU] via SUBCUTANEOUS

## 2020-09-21 MED ORDER — MIDAZOLAM HCL 2 MG/2ML IJ SOLN
INTRAMUSCULAR | Status: DC | PRN
  Administered 2020-09-21: 2 mg via INTRAVENOUS

## 2020-09-21 MED ORDER — LIDOCAINE HCL 1 % IJ SOLN
INTRAMUSCULAR | Status: AC
  Filled 2020-09-21: qty 20

## 2020-09-21 MED ORDER — PROPOFOL 10 MG/ML IV BOLUS
INTRAVENOUS | Status: AC
  Filled 2020-09-21: qty 20

## 2020-09-21 MED ORDER — CHLORHEXIDINE GLUCONATE 0.12 % MT SOLN: 15.0000 mL | OROMUCOSAL | Status: AC

## 2020-09-21 MED ORDER — ACETAMINOPHEN 500 MG PO TABS
ORAL_TABLET | ORAL | Status: AC
  Administered 2020-09-21: 1000 mg via ORAL
  Filled 2020-09-21: qty 2

## 2020-09-21 SURGICAL SUPPLY — 57 items
APL PRP STRL LF DISP 70% ISPRP (MISCELLANEOUS) ×1
BANDAGE ESMARK 6X9 LF (GAUZE/BANDAGES/DRESSINGS) IMPLANT
BLADE AVERAGE 25MMX9MM (BLADE)
BLADE AVERAGE 25X9 (BLADE) IMPLANT
BLADE SURG 15 STRL LF DISP TIS (BLADE) ×2 IMPLANT
BLADE SURG 15 STRL SS (BLADE) ×6
BNDG CMPR 9X6 STRL LF SNTH (GAUZE/BANDAGES/DRESSINGS)
BNDG ELASTIC 3X5.8 VLCR STR LF (GAUZE/BANDAGES/DRESSINGS) IMPLANT
BNDG ELASTIC 4X5.8 VLCR STR LF (GAUZE/BANDAGES/DRESSINGS) ×3 IMPLANT
BNDG ESMARK 6X9 LF (GAUZE/BANDAGES/DRESSINGS)
BNDG GAUZE ELAST 4 BULKY (GAUZE/BANDAGES/DRESSINGS) ×4 IMPLANT
CHLORAPREP W/TINT 26 (MISCELLANEOUS) ×3 IMPLANT
COVER BACK TABLE 60X90IN (DRAPES) ×3 IMPLANT
COVER WAND RF STERILE (DRAPES) IMPLANT
CUFF TOURN SGL QUICK 34 (TOURNIQUET CUFF)
CUFF TRNQT CYL 34X4.125X (TOURNIQUET CUFF) IMPLANT
DRAPE EXTREMITY T 121X128X90 (DISPOSABLE) ×3 IMPLANT
DRAPE IMP U-DRAPE 54X76 (DRAPES) ×3 IMPLANT
DRAPE OEC MINIVIEW 54X84 (DRAPES) IMPLANT
DRAPE SURG 17X23 STRL (DRAPES) IMPLANT
DRAPE U-SHAPE 47X51 STRL (DRAPES) ×3 IMPLANT
DRSG EMULSION OIL 3X3 NADH (GAUZE/BANDAGES/DRESSINGS) ×3 IMPLANT
ELECT REM PT RETURN 9FT ADLT (ELECTROSURGICAL) ×3
ELECTRODE REM PT RTRN 9FT ADLT (ELECTROSURGICAL) ×1 IMPLANT
GAUZE 4X4 16PLY RFD (DISPOSABLE) IMPLANT
GAUZE SPONGE 4X4 12PLY STRL (GAUZE/BANDAGES/DRESSINGS) ×3 IMPLANT
GLOVE BIO SURGEON STRL SZ7 (GLOVE) ×3 IMPLANT
GLOVE BIOGEL PI IND STRL 7.5 (GLOVE) ×1 IMPLANT
GLOVE BIOGEL PI INDICATOR 7.5 (GLOVE) ×2
GOWN STRL REUS W/ TWL LRG LVL3 (GOWN DISPOSABLE) ×1 IMPLANT
GOWN STRL REUS W/ TWL XL LVL3 (GOWN DISPOSABLE) ×1 IMPLANT
GOWN STRL REUS W/TWL LRG LVL3 (GOWN DISPOSABLE) ×3
GOWN STRL REUS W/TWL XL LVL3 (GOWN DISPOSABLE) ×3
KIT BASIN OR (CUSTOM PROCEDURE TRAY) ×3 IMPLANT
MICROMATRIX 1000MG (Tissue) ×3 IMPLANT
NDL HYPO 25X1 1.5 SAFETY (NEEDLE) ×1 IMPLANT
NDL SAFETY ECLIPSE 18X1.5 (NEEDLE) IMPLANT
NEEDLE HYPO 18GX1.5 SHARP (NEEDLE)
NEEDLE HYPO 25X1 1.5 SAFETY (NEEDLE) ×3 IMPLANT
NS IRRIG 1000ML POUR BTL (IV SOLUTION) IMPLANT
PADDING CAST ABS 4INX4YD NS (CAST SUPPLIES) ×4
PADDING CAST ABS COTTON 4X4 ST (CAST SUPPLIES) ×2 IMPLANT
PENCIL SMOKE EVACUATOR (MISCELLANEOUS) ×3 IMPLANT
SOLUTION PARTIC MCRMTRX 1000MG (Tissue) IMPLANT
STAPLER VISISTAT 35W (STAPLE) IMPLANT
STOCKINETTE 6  STRL (DRAPES) ×3
STOCKINETTE 6 STRL (DRAPES) ×1 IMPLANT
SUT MNCRL AB 3-0 PS2 18 (SUTURE) IMPLANT
SUT MNCRL AB 4-0 PS2 18 (SUTURE) IMPLANT
SUT MON AB 5-0 PS2 18 (SUTURE) IMPLANT
SUT PROLENE 3 0 PS 2 (SUTURE) ×3 IMPLANT
SUT PROLENE 4 0 PS 2 18 (SUTURE) IMPLANT
SWAB CULTURE LIQUID MINI MALE (MISCELLANEOUS) IMPLANT
SYR BULB EAR ULCER 3OZ GRN STR (SYRINGE) ×3 IMPLANT
SYR CONTROL 10ML LL (SYRINGE) IMPLANT
UNDERPAD 30X36 HEAVY ABSORB (UNDERPADS AND DIAPERS) ×3 IMPLANT
YANKAUER SUCT BULB TIP NO VENT (SUCTIONS) ×3 IMPLANT

## 2020-09-21 NOTE — Progress Notes (Signed)
Subjective: No new complaints   Antibiotics:  Anti-infectives (From admission, onward)   Start     Dose/Rate Route Frequency Ordered Stop   09/17/20 1600  penicillin G potassium 12 Million Units in dextrose 5 % 500 mL continuous infusion        12 Million Units 41.7 mL/hr over 12 Hours Intravenous Every 12 hours 09/17/20 0959     09/15/20 1500  cefTRIAXone (ROCEPHIN) 2 g in sodium chloride 0.9 % 100 mL IVPB  Status:  Discontinued        2 g 200 mL/hr over 30 Minutes Intravenous Every 24 hours 09/15/20 1049 09/17/20 0959   09/15/20 0400  vancomycin (VANCOREADY) IVPB 1250 mg/250 mL  Status:  Discontinued        1,250 mg 166.7 mL/hr over 90 Minutes Intravenous Every 12 hours 09/14/20 1416 09/17/20 0959   09/14/20 1500  vancomycin (VANCOCIN) 2,500 mg in sodium chloride 0.9 % 500 mL IVPB        2,500 mg 250 mL/hr over 120 Minutes Intravenous  Once 09/14/20 1410 09/14/20 1901   09/14/20 1430  metroNIDAZOLE (FLAGYL) IVPB 500 mg  Status:  Discontinued        500 mg 100 mL/hr over 60 Minutes Intravenous Every 8 hours 09/14/20 1358 09/15/20 1343   09/14/20 1430  ceFEPIme (MAXIPIME) 2 g in sodium chloride 0.9 % 100 mL IVPB  Status:  Discontinued        2 g 200 mL/hr over 30 Minutes Intravenous Every 8 hours 09/14/20 1410 09/15/20 1049      Medications: Scheduled Meds: . acetaminophen  1,000 mg Oral Q8H  . amLODipine  10 mg Oral Daily  . atorvastatin  40 mg Oral Daily  . Chlorhexidine Gluconate Cloth  6 each Topical Daily  . docusate sodium  100 mg Oral BID  . DULoxetine  90 mg Oral Daily  . enoxaparin (LOVENOX) injection  65 mg Subcutaneous Q24H  . insulin aspart  0-20 Units Subcutaneous TID WC  . insulin aspart  0-5 Units Subcutaneous QHS  . insulin aspart  15 Units Subcutaneous TID WC  . insulin detemir  40 Units Subcutaneous BID  . levothyroxine  75 mcg Oral Q0600  . losartan  50 mg Oral Daily  . multivitamin with minerals  1 tablet Oral Daily  . Ensure Max Protein   11 oz Oral QHS  . senna-docusate  1 tablet Oral BID  . traMADol  50 mg Oral Q8H   Continuous Infusions: . lactated ringers 10 mL/hr at 09/21/20 1134  . penicillin g continuous IV infusion 12 Million Units (09/21/20 0554)   PRN Meds:.hydrALAZINE, HYDROmorphone (DILAUDID) injection, lidocaine (PF), ondansetron **OR** ondansetron (ZOFRAN) IV, polyethylene glycol, sodium chloride flush    Objective: Weight change:   Intake/Output Summary (Last 24 hours) at 09/21/2020 1653 Last data filed at 09/21/2020 1235 Gross per 24 hour  Intake 500 ml  Output 3 ml  Net 497 ml   Blood pressure (!) 154/72, pulse 82, temperature 97.6 F (36.4 C), temperature source Oral, resp. rate 18, height 5\' 7"  (1.702 m), weight 129.3 kg, SpO2 100 %. Temp:  [97.4 F (36.3 C)-98.7 F (37.1 C)] 97.6 F (36.4 C) (10/21 1526) Pulse Rate:  [78-86] 82 (10/21 1526) Resp:  [14-20] 18 (10/21 1526) BP: (128-158)/(56-77) 154/72 (10/21 1526) SpO2:  [94 %-100 %] 100 % (10/21 1526) Weight:  [129.3 kg] 129.3 kg (10/21 1113)  Physical Exam: General: Alert and awake, oriented x3, not in  any acute distress. HEENT: anicteric sclera, EOMI CVS regular rate, normal  Chest: , no wheezing, no respiratory distress Abdomen: soft non-distended,  y Skin: Ulceration under fourth toe Neuro: nonfocal  CBC:    BMET Recent Labs    09/20/20 0210 09/21/20 0418  NA 136 130*  K 3.8 3.9  CL 98 95*  CO2 27 25  GLUCOSE 109* 187*  BUN 8 10  CREATININE 0.59 0.66  CALCIUM 8.8* 8.7*     Liver Panel  Recent Labs    09/20/20 0210  ALBUMIN 2.3*       Sedimentation Rate No results for input(s): ESRSEDRATE in the last 72 hours. C-Reactive Protein No results for input(s): CRP in the last 72 hours.  Micro Results: Recent Results (from the past 720 hour(s))  Urine culture     Status: Abnormal   Collection Time: 09/08/20 10:30 PM   Specimen: Urine, Random  Result Value Ref Range Status   Specimen Description   Final     URINE, RANDOM Performed at [facility], [address] [place], [place] [address]    Special Requests   Final    NONE Performed at [facility], [address] [place], [place] [address]    Culture 80,000 COLONIES/mL ESCHERICHIA COLI (A)  Final   Report Status 09/11/2020 FINAL  Final   Organism ID, Bacteria ESCHERICHIA COLI (A)  Final      Susceptibility   Escherichia coli - MIC*    AMPICILLIN >=32 RESISTANT Resistant     CEFAZOLIN <=4 SENSITIVE Sensitive     CEFTRIAXONE <=0.25 SENSITIVE Sensitive     CIPROFLOXACIN <=0.25 SENSITIVE Sensitive     GENTAMICIN <=1 SENSITIVE Sensitive     IMIPENEM <=0.25 SENSITIVE Sensitive     NITROFURANTOIN <=16 SENSITIVE Sensitive     TRIMETH/SULFA <=20 SENSITIVE Sensitive     AMPICILLIN/SULBACTAM 8 SENSITIVE Sensitive     PIP/TAZO <=4 SENSITIVE Sensitive     * 80,000 COLONIES/mL ESCHERICHIA COLI  Respiratory Panel by RT PCR (Flu A&B, Covid) - Nasopharyngeal Swab     Status: None   Collection Time: 09/12/20  1:43 PM   Specimen: Nasopharyngeal Swab  Result Value Ref Range Status   SARS Coronavirus 2 by RT PCR NEGATIVE NEGATIVE Final    Comment: (NOTE) SARS-CoV-2 target nucleic acids are NOT DETECTED.  The SARS-CoV-2 RNA is generally detectable in upper respiratoy specimens during the acute phase of infection. The lowest concentration of SARS-CoV-2 viral copies this assay can detect is 131 copies/mL. A negative result does not preclude SARS-Cov-2 infection and should not be used as the sole basis for treatment or other patient management decisions. A negative result may occur with  improper specimen collection/handling, submission of specimen other than nasopharyngeal swab, presence of viral mutation(s) within the areas targeted by this assay, and inadequate number of viral copies (<131 copies/mL). A negative result must be combined with clinical observations, patient history, and epidemiological information.  The expected result is Negative.  Fact Sheet for Patients:  PinkCheek.be  Fact Sheet for Healthcare Providers:  GravelBags.it  This test is no t yet approved or cleared by the [place] FDA and  has been authorized for detection and/or diagnosis of SARS-CoV-2 by FDA under an Emergency Use Authorization (EUA). This EUA will remain  in effect (meaning this test can be used) for the duration of the COVID-19 declaration under Section 564(b)(1) of the Act, 21 U.S.C. section 360bbb-3(b)(1), unless the authorization is terminated or  revoked sooner.     Influenza A by PCR NEGATIVE NEGATIVE Final   Influenza B by PCR NEGATIVE NEGATIVE Final    Comment: (NOTE) The Xpert Xpress SARS-CoV-2/FLU/RSV assay is intended as an aid in  the diagnosis of influenza from Nasopharyngeal swab specimens and  should not be used as a sole basis for treatment. Nasal washings and  aspirates are unacceptable for Xpert Xpress SARS-CoV-2/FLU/RSV  testing.  Fact Sheet for Patients: PinkCheek.be  Fact Sheet for Healthcare Providers: GravelBags.it  This test is not yet approved or cleared by the [place] FDA and  has been authorized for detection and/or diagnosis of SARS-CoV-2 by  FDA under an Emergency Use Authorization (EUA). This EUA will remain  in effect (meaning this test can be used) for the duration of the  Covid-19 declaration under Section 564(b)(1) of the Act, 21  U.S.C. section 360bbb-3(b)(1), unless the authorization is  terminated or revoked. Performed at [facility], [place] [address] [place], [place] [address]   Aerobic/Anaerobic Culture (surgical/deep wound)     Status: None   Collection Time: 09/14/20  1:52 PM   Specimen: Abscess; Synovial Fluid  Result Value Ref Range Status   Specimen Description ABSCESS  Final   Special Requests RIGHT L4 L5  JOINT  Final   Gram Stain   Final    ABUNDANT WBC PRESENT, PREDOMINANTLY PMN MODERATE GRAM POSITIVE COCCI IN PAIRS IN CHAINS    Culture   Final    FEW STREPTOCOCCUS MITIS/ORALIS NO ANAEROBES ISOLATED Performed at [facility], [address], [place], [place] [address]    Report Status 09/19/2020 FINAL  Final   Organism ID, Bacteria STREPTOCOCCUS MITIS/ORALIS  Final      Susceptibility   Streptococcus mitis/oralis - MIC*    TETRACYCLINE 0.5 SENSITIVE Sensitive     VANCOMYCIN 0.5 SENSITIVE Sensitive     CLINDAMYCIN 0.5 INTERMEDIATE Intermediate     PENICILLIN Value in next row Sensitive      SENSITIVE0.06    CEFTRIAXONE Value in next row Sensitive      SENSITIVE0.12    * FEW STREPTOCOCCUS MITIS/ORALIS  Surgical pcr screen     Status: None   Collection Time: 09/20/20  5:42 PM   Specimen: Nasal Mucosa; Nasal Swab  Result Value Ref Range Status   MRSA, PCR NEGATIVE NEGATIVE Final   Staphylococcus aureus NEGATIVE NEGATIVE Final    Comment: (NOTE) The Xpert SA Assay (FDA approved for NASAL specimens in patients 59 years of age and older), is one component of a comprehensive surveillance program. It is not intended to diagnose infection nor to guide or monitor treatment. Performed at [facility], [place] [address] [place], [place] [address]     Studies/Results: [place] Foot Complete Left  Result Date: 09/21/2020 CLINICAL DATA:  Status post fourth toe amputation. EXAM: LEFT FOOT - COMPLETE 3+ VIEW COMPARISON:  None. FINDINGS: Postsurgical changes are seen involving the distal tibia and fibula. Severe degenerative changes seen involving the talotibial joint. Status post amputation of the fourth toe at the level of the proximal base of the fourth proximal phalanx. No other fracture or dislocation is noted. IMPRESSION: Status post amputation of the fourth toe at the level of the proximal base of the fourth proximal phalanx. 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 Electronically Signed   By: Marijo Conception M.D.   On: 09/21/2020 13:12   IRPICC PLACEMENT LEFT >5 YRS INC IMG GUIDE  Result Date: 09/20/2020  INDICATION: Patient history of poor venous access, need for long-term IV antibiotic therapy. Request to IR for PICC placement. EXAM: RIGHT UPPER EXTREMITY PICC LINE PLACEMENT WITH ULTRASOUND AND FLUOROSCOPIC GUIDANCE MEDICATIONS: 4 mL 1% lidocaine ANESTHESIA/SEDATION: None. FLUOROSCOPY TIME:  Fluoroscopy Time: 2 minutes 48 seconds (1 mGy). COMPLICATIONS: None immediate. PROCEDURE: The patient was advised of the possible risks and complications and agreed to undergo the procedure. The patient was then brought to the angiographic suite for the procedure. The right arm was prepped with chlorhexidine, draped in the usual sterile fashion using maximum barrier technique (cap and mask, sterile gown, sterile gloves, large sterile sheet, hand hygiene and cutaneous antisepsis) and infiltrated locally with 1% Lidocaine. Ultrasound demonstrated patency of the right basilic vein, and this was documented with an image. Under real-time ultrasound guidance, this vein was accessed with a 21 gauge micropuncture needle and image documentation was performed. A 0.018 wire was introduced in to the vein. Over this, a 5 [place] double lumen power injectable PICC was advanced to the lower SVC/right atrial junction. Fluoroscopy during the procedure and fluoro spot radiograph confirms appropriate catheter position. The catheter was flushed, aspirated, capped and covered with a sterile dressing. Catheter length: 36 cm IMPRESSION: Successful right arm power PICC line placement with ultrasound and fluoroscopic guidance. The catheter is ready for use. Read by Candiss Norse, PA-C Electronically Signed   By: Ruthann Cancer MD   On: 09/20/2020 14:01      Assessment/Plan:  INTERVAL HISTORY:   She went to the OR since we saw her this AM   Active Problems:   Type 2 diabetes mellitus  with hyperglycemia ([facility])   Abscess in epidural space of lumbar spine   Diabetic foot ulcer (HCC)   Ankle pain   Epidural abscess    Brittany Silva is a 64 y.o. female with  L4-5 facet joint and epidural abscess.  Status post IR guided aspiration with Strep mitis/oralis S to PCN and now on high dose penicillin. She has osteo of her 4th digit and to have amputation today  --continue PCN --followup cultures from OR from today.     LOS: 9 days   Alcide Evener 09/21/2020, 4:53 PM

## 2020-09-21 NOTE — Anesthesia Procedure Notes (Signed)
Procedure Name: LMA Insertion Date/Time: 09/21/2020 12:04 PM Performed by: Claris Che, CRNA Pre-anesthesia Checklist: Patient identified, Emergency Drugs available, Suction available, Patient being monitored and Timeout performed Patient Re-evaluated:Patient Re-evaluated prior to induction Oxygen Delivery Method: Circle system utilized Preoxygenation: Pre-oxygenation with 100% oxygen Induction Type: IV induction Ventilation: Mask ventilation without difficulty LMA Size: 4.0 Placement Confirmation: positive ETCO2 and breath sounds checked- equal and bilateral Dental Injury: Teeth and Oropharynx as per pre-operative assessment

## 2020-09-21 NOTE — Progress Notes (Signed)
PT Cancellation Note  Patient Details Name: Brittany Silva MRN: [id number] DOB: 1956-06-26   Cancelled Treatment:    Reason Eval/Treat Not Completed: (P) Patient at procedure or test/unavailable;Other (comment) (at OR for toe amputation)*   [name] [name] 09/21/2020, 5:05 PM *delayed entry

## 2020-09-21 NOTE — OR Nursing (Signed)
Pt is awake,alert and oriented.Pt and/or family verbalized understanding of poc and discharge instructions. Reviewed admission and on going care with receiving RN. Pt is in NAD at this time and is ready to be transferred to floor. Will con't to monitor until pt is transferred. Report given to To RN, Tech took pt x2

## 2020-09-21 NOTE — Anesthesia Preprocedure Evaluation (Signed)
Anesthesia Evaluation  Patient identified by MRN, date of birth, ID band Patient awake    Reviewed: Allergy & Precautions, NPO status , Patient's Chart, lab work & pertinent test results  Airway Mallampati: II  TM Distance: >3 FB Neck ROM: Full    Dental  (+) Teeth Intact   Pulmonary sleep apnea ,    Pulmonary exam normal breath sounds clear to auscultation       Cardiovascular hypertension, Pt. on medications Normal cardiovascular exam Rhythm:Regular Rate:Normal     Neuro/Psych PSYCHIATRIC DISORDERS Anxiety  Neuromuscular disease    GI/Hepatic Neg liver ROS, GERD  ,  Endo/Other  diabetes, Type 1, Insulin DependentHypothyroidism Morbid obesity  Renal/GU negative Renal ROS     Musculoskeletal  (+) Arthritis ,   Abdominal   Peds  Hematology negative hematology ROS (+)   Anesthesia Other Findings Day of surgery medications reviewed with the patient.  Reproductive/Obstetrics                             Anesthesia Physical Anesthesia Plan  ASA: III  Anesthesia Plan: General   Post-op Pain Management:    Induction: Intravenous  PONV Risk Score and Plan: 3 and Midazolam, Dexamethasone and Ondansetron  Airway Management Planned: LMA  Additional Equipment:   Intra-op Plan:   Post-operative Plan: Extubation in OR  Informed Consent: I have reviewed the patients History and Physical, chart, labs and discussed the procedure including the risks, benefits and alternatives for the proposed anesthesia with the patient or authorized representative who has indicated his/her understanding and acceptance.       Plan Discussed with: CRNA  Anesthesia Plan Comments:         Anesthesia Quick Evaluation

## 2020-09-21 NOTE — Plan of Care (Signed)
°  Problem: Education: Goal: Knowledge of General Education information will improve Description: Including pain rating scale, medication(s)/side effects and non-pharmacologic comfort measures Outcome: Progressing   Problem: Health Behavior/Discharge Planning: Goal: Ability to manage health-related needs will improve Outcome: Progressing   Problem: Clinical Measurements: Goal: Ability to maintain clinical measurements within normal limits will improve Outcome: Progressing Goal: Will remain free from infection Outcome: Progressing Goal: Diagnostic test results will improve Outcome: Progressing Goal: Respiratory complications will improve Outcome: Progressing Goal: Cardiovascular complication will be avoided Outcome: Progressing   Problem: Pain Managment: Goal: General experience of comfort will improve Outcome: Progressing   Problem: Safety: Goal: Ability to remain free from injury will improve Outcome: Progressing   Problem: Coping: Goal: Level of anxiety will decrease Outcome: Progressing   Problem: Elimination: Goal: Will not experience complications related to bowel motility Outcome: Progressing Goal: Will not experience complications related to urinary retention Outcome: Progressing

## 2020-09-21 NOTE — H&P (Signed)
Anesthesia H&P Update: History and Physical Exam reviewed; patient is OK for planned anesthetic and procedure. ? ?

## 2020-09-21 NOTE — Op Note (Signed)
Surgeon: Surgeon(s): Felipa Furnace, DPM  Assistants: None Pre-operative diagnosis: OSTEOMYELITIS left fourth toe  Post-operative diagnosis: same Procedure: Procedure(s) (LRB): AMPUTATION LEFT 4TH TOE (Left)  Pathology:  ID Type Source Tests Collected by Time Destination  1 : Left fouth toe Tissue PATH Digit amputation SURGICAL PATHOLOGY Felipa Furnace, DPM 09/21/2020 1220     Pertinent Intra-op findings: Purulent drainage noted to the distal part of it.  No infection noted.  Proximal amputation site Anesthesia: General with regional local block Hemostasis: Calf tourniquet for 9 minutes EBL: Minimal Materials: 3-0 Prolene and ACell matrix Injectables: None Complications: None  Indications for surgery: A 64 y.o. female presents with left fourth digit osteomyelitis of the distal phalanx. Patient has failed all conservative therapy including but not limited to local wound care and IV antibiotics. sHe wishes to have surgical correction of the foot/deformity. It was determined that patient would benefit from left partial fourth digit amputation with closuresupine. Informed surgical risk consent was reviewed and read aloud to the patient.  I reviewed the films.  I have discussed my findings with the patient in great detail.  I have discussed all risks including but not limited to infection, stiffness, scarring, limp, disability, deformity, damage to blood vessels and nerves, numbness, poor healing, need for braces, arthritis, chronic pain, amputation, death.  All benefits and realistic expectations discussed in great detail.  I have made no promises as to the outcome.  I have provided realistic expectations.  I have offered the patient a 2nd opinion, which they have declined and assured me they preferred to proceed despite the risks   Procedure in detail: The patient was both verbally and visually identified by myself, the nursing staff, and anesthesia staff in the preoperative holding area. They  were then transferred to the operating room and placed on the operative table in supine position.  Attention was directed to the left fourth digit, a skin marker was used to delineate a fishmouth style incision to incorporate the entire fourth digit at the level of the base of the proximal phalanx.  A #15 blade was used to carry the incision down from epidermis down to the level of the bone.  The toe was disarticulated at the level of the PIPJ joint and sent off to pathology in standard manner.  A sagittal saw was used to resect the proximal phalanx down to the level of the base.  At this time is important to note that the bone was hard without any signs of osteomyelitis.  The wound appeared clear of infection.  The wound was thoroughly irrigated with normal saline solution.  ACell matrix graft was applied.  The wound was primarily closed with 3-0 Prolene.  The incision site was dressed with Betadine 4 x 4 gauze, Kerlix, Kling, Ace bandage.  All bony prominences were adequately padded.  The tourniquet was dropped a total of 9 minutes.  Brisk cap refill was returned to all the digits to the left side.  At the conclusion of the procedure the patient was awoken from anesthesia and found to have tolerated the procedure well any complications. There were transferred to PACU with vital signs stable and vascular status intact.  Boneta Lucks, DPM

## 2020-09-21 NOTE — Anesthesia Postprocedure Evaluation (Signed)
Anesthesia Post Note  Patient: Brittany Silva  Procedure(s) Performed: AMPUTATION LEFT 4TH TOE (Left Foot) APPLICATION OF A-CELL, left foot (Left Foot)     Patient location during evaluation: PACU Anesthesia Type: General Level of consciousness: awake and alert, awake and oriented Pain management: pain level controlled Vital Signs Assessment: post-procedure vital signs reviewed and stable Respiratory status: spontaneous breathing, nonlabored ventilation and respiratory function stable Cardiovascular status: blood pressure returned to baseline and stable Postop Assessment: no apparent nausea or vomiting Anesthetic complications: no   No complications documented.  Last Vitals:  Vitals:   09/21/20 1523 09/21/20 1526  BP: (!) 154/72 (!) 154/72  Pulse: 82 82  Resp: 18 18  Temp: 36.4 C 36.4 C  SpO2: 100% 100%    Last Pain:  Vitals:   09/21/20 1526  TempSrc: Oral  PainSc:                  Catalina Gravel

## 2020-09-21 NOTE — Interval H&P Note (Signed)
History and Physical Interval Note:  09/21/2020 11:50 AM  Brittany Silva  has presented today for surgery, with the diagnosis of OSTEOMYELITIS.  The various methods of treatment have been discussed with the patient and family. After consideration of risks, benefits and other options for treatment, the patient has consented to  Procedure(s): AMPUTATION LEFT 4TH TOE (Left) as a surgical intervention.  The patient's history has been reviewed, patient examined, no change in status, stable for surgery.  I have reviewed the patient's chart and labs.  Questions were answered to the patient's satisfaction.    Informed surgical risk consent was reviewed and read aloud to the patient.  I reviewed the films.  I have discussed my findings with the patient in great detail.  I have discussed all risks including but not limited to infection, stiffness, scarring, limp, disability, deformity, damage to blood vessels and nerves, numbness, poor healing, need for braces, arthritis, chronic pain, amputation, death.  All benefits and realistic expectations discussed in great detail.  I have made no promises as to the outcome.  I have provided realistic expectations.  I have offered the patient a 2nd opinion, which they have declined and assured me they preferred to proceed despite the risks   Felipa Furnace

## 2020-09-21 NOTE — Transfer of Care (Signed)
Immediate Anesthesia Transfer of Care Note  Patient: Brittany Silva  Procedure(s) Performed: AMPUTATION LEFT 4TH TOE (Left Foot)  Patient Location: PACU  Anesthesia Type:GA combined with regional for post-op pain  Level of Consciousness: oriented, drowsy, patient cooperative and responds to stimulation  Airway & Oxygen Therapy: Patient Spontanous Breathing and Patient connected to nasal cannula oxygen  Post-op Assessment: Report given to RN, Post -op Vital signs reviewed and stable and Patient moving all extremities X 4  Post vital signs: Reviewed and stable  Last Vitals:  Vitals Value Taken Time  BP    Temp    Pulse    Resp    SpO2      Last Pain:  Vitals:   09/21/20 0830  TempSrc:   PainSc: 0-No pain      Patients Stated Pain Goal: 1 (77/11/65 [id number])  Complications: No complications documented.

## 2020-09-21 NOTE — Progress Notes (Signed)
OT Cancellation Note  Patient Details Name: Brittany Silva MRN: [id number] DOB: 03/02/1956   Cancelled Treatment:    Reason Eval/Treat Not Completed: Other (comment). Pt undergoing planned toe amputation today at 11AM. Will attempt OT re-evaluation s/p surgery to assess any functional changes and update recommendations as needed.  Layla Maw 09/21/2020, 8:40 AM

## 2020-09-21 NOTE — Progress Notes (Signed)
PROGRESS NOTE    Brittany Silva  TXM:[id number] DOB: 1956-06-20 DOA: 09/11/2020 PCP: Carol Ada, MD   Brief Narrative: 64 year old female with history of chronic back pain, DM-2, HTN, hypothyroidism, morbid obesity and recent UTI presenting with 5 days hx. of lower back pain, bilateral leg pain, nausea, vomiting, fever and chills. MRI lumbar spine concerning for lumbar epidural abscess around the L5-S1 disc space and soft tissue enhancement on the right posterior to the L4-5 and L5-S1 facet joints.  Neurosurgery consulted, and recommended transfer to [facility].  Concerned that a diabetic foot ulcer in her left fourth toe could be source of infection. She also has hardwares in her left foot.  The next day, evaluated by neurosurgery who recommended IR biopsy for tissue diagnosis and ID consult for antibiotics. Patient underwent fluoroscopy guided needle aspiration.  Tissue culture sent.  Started on IV vancomycin, cefepime and Flagyl which was deescalated to vancomycin and ceftriaxone, then to IV penicillin G.  MRI left foot concerning for Charcot arthropathy versus osteomyelitis but didn't include the toe with concern. Normal ABI but abnormal TBI.  Repeat MRI of left foot consistent with acute osteomyelitis.  Podiatry consulted , underwent left fourth toe amputation. Patient has PICC line for prolonged IV antibiotics.   Assessment & Plan:   Active Problems:   Type 2 diabetes mellitus with hyperglycemia (HCC)   Abscess in epidural space of lumbar spine   Diabetic foot ulcer (HCC)   Ankle pain   Epidural abscess  Acute on chronic low back pain/radiculopathy sec. to L5-S1 epidural abscess/ L4-5 and L5-S1 facet joint infection.  - There are no signs of cauda equina syndrome. -Imaging and ABI as above.  TTE without significant finding.  CRP 23>> 17.  ESR 85>> 77.  Mild leukocytosis. -Neurosurgery consulted and recommended IR biopsy and ID consult for antibiotics. -Underwent fluoroscopy  guided needle aspiration of the facet joints. -Initiated Vancomycin, cefepime and Flagyl on 10/14-10/15 -Continued Vancomycin and ceftriaxone 10/15>> 10/17 -Surgical tissue cultures grew few Streptococcus mitis/oralis sensitive to penicillin - Started IV penicillin G 10/17>> may need PICC line.  ID to decide timing. -Pain control scheduled Tylenol and tramadol, and IV Dilaudid for breakthrough pain -Bowel regimen with as needed MiraLAX and Senokot-S -Repeat MRI left foot shows acute osteomyelitis of the distal phalanx of left fourth toe. -Podiatry consulted, underwent left fourth toe amputation tolerated well. -As per podiatry she does not need antibiotics from foot standpoint of view.   - Will discuss with ID for further recommendation. -PT/OT eval   DM-2 with hypo- and hyperglycemia: On Levemir 75 units twice daily and SSI.  A1c 8.6%. -Continue SSI-high.  -Increase NovoLog AC from 12 to 15 units. -Continue Levemir 40 units twice daily. -Continue atorvastatin.  Essential hypertension:  -Continue home amlodipine. -Resume home losartan.  Hypothyroidism -Continue home Synthroid.  Hypokalemia-resolved.  Hyponatremia:  Na 132- 136 - 130 today. Recheck labs in am   Acute cystitis-treated with IV ceftriaxone x1 and Keflex x3 days -Should be covered with antibiotics as above.  Morbid obesity Body mass index is 44.64 kg/m    DVT prophylaxis: SCDS Code Status: Full Family Communication: No one available at bedside. Disposition Plan:   Status is: Inpatient  Remains inpatient appropriate because:Inpatient level of care appropriate due to severity of illness   Dispo: The patient is from: Home              Anticipated d/c is to: Home  Anticipated d/c date is: 2 days              Patient currently is not medically stable to d/c.    Consultants:   Neurosurgery-signed off  IR-off  Infectious disease  Procedures: 10/14-fluoroscopy guided needle  aspiration of facet joints  Antimicrobials:  Anti-infectives (From admission, onward)   Start     Dose/Rate Route Frequency Ordered Stop   09/17/20 1600  [MAR Hold]  penicillin G potassium 12 Million Units in dextrose 5 % 500 mL continuous infusion        (MAR Hold since Thu 09/21/2020 at 1042.Hold Reason: Transfer to a Procedural area.)   12 Million Units 41.7 mL/hr over 12 Hours Intravenous Every 12 hours 09/17/20 0959     09/15/20 1500  cefTRIAXone (ROCEPHIN) 2 g in sodium chloride 0.9 % 100 mL IVPB  Status:  Discontinued        2 g 200 mL/hr over 30 Minutes Intravenous Every 24 hours 09/15/20 1049 09/17/20 0959   09/15/20 0400  vancomycin (VANCOREADY) IVPB 1250 mg/250 mL  Status:  Discontinued        1,250 mg 166.7 mL/hr over 90 Minutes Intravenous Every 12 hours 09/14/20 1416 09/17/20 0959   09/14/20 1500  vancomycin (VANCOCIN) 2,500 mg in sodium chloride 0.9 % 500 mL IVPB        2,500 mg 250 mL/hr over 120 Minutes Intravenous  Once 09/14/20 1410 09/14/20 1901   09/14/20 1430  metroNIDAZOLE (FLAGYL) IVPB 500 mg  Status:  Discontinued        500 mg 100 mL/hr over 60 Minutes Intravenous Every 8 hours 09/14/20 1358 09/15/20 1343   09/14/20 1430  ceFEPIme (MAXIPIME) 2 g in sodium chloride 0.9 % 100 mL IVPB  Status:  Discontinued        2 g 200 mL/hr over 30 Minutes Intravenous Every 8 hours 09/14/20 1410 09/15/20 1049     Subjective: Patient was seen and examined at bedside.  Overnight events noted.  She reports pain is better controlled.  She had  bowel movement yesterday,  reports abdomen is soft, denies nausea,  Vomiting,  diarrhea.  She has received a PICC line in the right arm yesterday.  She is going to be having the left fourth toe amputation today. Objective: Vitals:   09/21/20 1245 09/21/20 1300 09/21/20 1315 09/21/20 1330  BP: (!) 146/70 (!) 158/61 130/76 136/65  Pulse: 85 78 82   Resp: '17 20 14 16  ' Temp:   97.6 F (36.4 C)   TempSrc:      SpO2: 100% 95% 98% 94%    Weight:      Height:        Intake/Output Summary (Last 24 hours) at 09/21/2020 1343 Last data filed at 09/21/2020 1235 Gross per 24 hour  Intake 500 ml  Output 3 ml  Net 497 ml   Filed Weights   09/13/20 0739 09/21/20 1113  Weight: 129.3 kg 129.3 kg    Examination:  General exam: Appears calm and comfortable  Respiratory system: Clear to auscultation. Respiratory effort normal. Cardiovascular system: S1 & S2 heard, RRR. No JVD, murmurs, rubs, gallops or clicks. No pedal edema. Gastrointestinal system: Abdomen is nondistended, soft and nontender. No organomegaly or masses felt. Normal bowel sounds heard. Central nervous system: Alert and oriented. No focal neurological deficits. Extremities: No edema no cyanosis no clubbing.  There is a small dry scab over the tip of the left fourth toe. Skin: No rashes, lesions or ulcers  Psychiatry: Judgement and insight appear normal. Mood & affect appropriate.     Data Reviewed: I have personally reviewed following labs and imaging studies  CBC: Recent Labs  Lab 09/15/20 0322 09/16/20 0155 09/18/20 0359 09/20/20 0210 09/21/20 0418  WBC 11.1* 11.5* 11.3* 8.4 8.6  NEUTROABS  --  9.1*  --  6.3  --   HGB 11.4* 11.7* 12.1 11.5* 11.6*  HCT 35.0* 35.9* 36.5 34.9* 34.7*  MCV 86.6 89.1 87.7 87.7 87.6  PLT 413* 407* 425* 424* 097   Basic Metabolic Panel: Recent Labs  Lab 09/15/20 0322 09/15/20 0322 09/16/20 0155 09/18/20 0359 09/19/20 0253 09/20/20 0210 09/21/20 0418  NA 131*  --  132* 132*  --  136 130*  K 3.9  --  3.6 4.1  --  3.8 3.9  CL 97*  --  97* 97*  --  98 95*  CO2 23  --  26 24  --  27 25  GLUCOSE 279*  --  230* 353*  --  109* 187*  BUN 15  --  16 12  --  8 10  CREATININE 0.77   < > 0.74 0.63 0.59 0.59 0.66  CALCIUM 8.0*  --  8.4* 8.4*  --  8.8* 8.7*  MG 2.3  --  2.1 2.0  --  1.9 1.8  PHOS 3.3  --  3.5 2.9  --  3.6 2.9   < > = values in this interval not displayed.   GFR: Estimated Creatinine Clearance: 99.5  mL/min (by C-G formula based on SCr of 0.66 mg/dL). Liver Function Tests: Recent Labs  Lab 09/15/20 0322 09/16/20 0155 09/18/20 0359 09/20/20 0210  ALBUMIN 2.3* 2.2* 2.3* 2.3*   No results for input(s): LIPASE, AMYLASE in the last 168 hours. No results for input(s): AMMONIA in the last 168 hours. Coagulation Profile: No results for input(s): INR, PROTIME in the last 168 hours. Cardiac Enzymes: No results for input(s): CKTOTAL, CKMB, CKMBINDEX, TROPONINI in the last 168 hours. BNP (last 3 results) No results for input(s): PROBNP in the last 8760 hours. HbA1C: No results for input(s): HGBA1C in the last 72 hours. CBG: Recent Labs  Lab 09/20/20 2108 09/20/20 2303 09/21/20 0638 09/21/20 1012 09/21/20 1245  GLUCAP 69* 169* 174* 184* 153*   Lipid Profile: No results for input(s): CHOL, HDL, LDLCALC, TRIG, CHOLHDL, LDLDIRECT in the last 72 hours. Thyroid Function Tests: No results for input(s): TSH, T4TOTAL, FREET4, T3FREE, THYROIDAB in the last 72 hours. Anemia Panel: No results for input(s): VITAMINB12, FOLATE, FERRITIN, TIBC, IRON, RETICCTPCT in the last 72 hours. Sepsis Labs: No results for input(s): PROCALCITON, LATICACIDVEN in the last 168 hours.  Recent Results (from the past 240 hour(s))  Respiratory Panel by RT PCR (Flu A&B, Covid) - Nasopharyngeal Swab     Status: None   Collection Time: 09/12/20  1:43 PM   Specimen: Nasopharyngeal Swab  Result Value Ref Range Status   SARS Coronavirus 2 by RT PCR NEGATIVE NEGATIVE Final    Comment: (NOTE) SARS-CoV-2 target nucleic acids are NOT DETECTED.  The SARS-CoV-2 RNA is generally detectable in upper respiratoy specimens during the acute phase of infection. The lowest concentration of SARS-CoV-2 viral copies this assay can detect is 131 copies/mL. A negative result does not preclude SARS-Cov-2 infection and should not be used as the sole basis for treatment or other patient management decisions. A negative result may  occur with  improper specimen collection/handling, submission of specimen other than nasopharyngeal swab, presence of viral mutation(s)  within the areas targeted by this assay, and inadequate number of viral copies (<131 copies/mL). A negative result must be combined with clinical observations, patient history, and epidemiological information. The expected result is Negative.  Fact Sheet for Patients:  PinkCheek.be  Fact Sheet for Healthcare Providers:  GravelBags.it  This test is no t yet approved or cleared by the [place] FDA and  has been authorized for detection and/or diagnosis of SARS-CoV-2 by FDA under an Emergency Use Authorization (EUA). This EUA will remain  in effect (meaning this test can be used) for the duration of the COVID-19 declaration under Section 564(b)(1) of the Act, 21 U.S.C. section 360bbb-3(b)(1), unless the authorization is terminated or revoked sooner.     Influenza A by PCR NEGATIVE NEGATIVE Final   Influenza B by PCR NEGATIVE NEGATIVE Final    Comment: (NOTE) The Xpert Xpress SARS-CoV-2/FLU/RSV assay is intended as an aid in  the diagnosis of influenza from Nasopharyngeal swab specimens and  should not be used as a sole basis for treatment. Nasal washings and  aspirates are unacceptable for Xpert Xpress SARS-CoV-2/FLU/RSV  testing.  Fact Sheet for Patients: PinkCheek.be  Fact Sheet for Healthcare Providers: GravelBags.it  This test is not yet approved or cleared by the [place] FDA and  has been authorized for detection and/or diagnosis of SARS-CoV-2 by  FDA under an Emergency Use Authorization (EUA). This EUA will remain  in effect (meaning this test can be used) for the duration of the  Covid-19 declaration under Section 564(b)(1) of the Act, 21  U.S.C. section 360bbb-3(b)(1), unless the authorization is  terminated or  revoked. Performed at [facility], [place] [address], [place], [place] [address]   Aerobic/Anaerobic Culture (surgical/deep wound)     Status: None   Collection Time: 09/14/20  1:52 PM   Specimen: Abscess; Synovial Fluid  Result Value Ref Range Status   Specimen Description ABSCESS  Final   Special Requests RIGHT L4 L5 JOINT  Final   Gram Stain   Final    ABUNDANT WBC PRESENT, PREDOMINANTLY PMN MODERATE GRAM POSITIVE COCCI IN PAIRS IN CHAINS    Culture   Final    FEW STREPTOCOCCUS MITIS/ORALIS NO ANAEROBES ISOLATED Performed at [facility], [address] [place], [place] [address]    Report Status 09/19/2020 FINAL  Final   Organism ID, Bacteria STREPTOCOCCUS MITIS/ORALIS  Final      Susceptibility   Streptococcus mitis/oralis - MIC*    TETRACYCLINE 0.5 SENSITIVE Sensitive     VANCOMYCIN 0.5 SENSITIVE Sensitive     CLINDAMYCIN 0.5 INTERMEDIATE Intermediate     PENICILLIN Value in next row Sensitive      SENSITIVE0.06    CEFTRIAXONE Value in next row Sensitive      SENSITIVE0.12    * FEW STREPTOCOCCUS MITIS/ORALIS  Surgical pcr screen     Status: None   Collection Time: 09/20/20  5:42 PM   Specimen: Nasal Mucosa; Nasal Swab  Result Value Ref Range Status   MRSA, PCR NEGATIVE NEGATIVE Final   Staphylococcus aureus NEGATIVE NEGATIVE Final    Comment: (NOTE) The Xpert SA Assay (FDA approved for NASAL specimens in patients 64 years of age and older), is one component of a comprehensive surveillance program. It is not intended to diagnose infection nor to guide or monitor treatment. Performed at [facility], [place] [address], [place], [place] [address]     Radiology Studies: MR FOOT LEFT W WO CONTRAST  Result Date:  09/19/2020 CLINICAL DATA:  Diabetic foot ulcer EXAM: MRI OF THE LEFT FOREFOOT WITHOUT AND WITH CONTRAST TECHNIQUE: Multiplanar, multisequence MR imaging of the left forefoot was performed both before and after administration of  intravenous contrast. CONTRAST:  57m GADAVIST GADOBUTROL 1 MMOL/ML IV SOLN COMPARISON:  X-ray 09/08/2020 FINDINGS: Technical note: Motion degraded examination. Bones/Joint/Cartilage Bone marrow edema throughout the distal phalanx of the left fourth toe with associated confluent low T1 bone marrow signal (series 9, images 11-13). Marrow edema extends to the level of the proximal interphalangeal joint. The distal interphalangeal joint appears congenitally fused. No marrow edema within the fourth toe proximal phalanx. There are fractures involving the subchondral aspect of the second and third metatarsal heads with surrounding marrow edema (series 7, image 14). 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 Elsewhere, included osseous structures of the forefoot are intact. Ligaments Intact Lisfranc ligament. No evidence of collateral ligament disruption. Muscles and Tendons Diffuse atrophy and fatty infiltration of the intrinsic foot musculature suggesting chronic denervation changes. Soft tissues Soft tissue swelling involving the distal aspect of the fourth toe. Suspect small plantar skin ulceration with underlying fluid collection measuring 10 x 7 x 9 mm adjacent to the fourth toe distal phalanx. IMPRESSION: 1. Acute osteomyelitis of the distal phalanx of the left fourth toe. 2. Small plantar skin ulceration with underlying fluid collection measuring 10 x 7 x 9 mm adjacent to the fourth toe distal phalanx. 3. Nondisplaced fractures involving the subchondral aspects of the second and third metatarsal heads with surrounding marrow edema, likely acute to subacute. Findings could be posttraumatic or secondary to osteonecrosis. 4. 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 Electronically Signed   By: NDavina PokeD.O.   On: 09/19/2020 15:24   IBaconPLACEMENT LEFT  >5 YRS INC IMG GUIDE  Result Date: 09/20/2020 INDICATION: Patient history of poor venous access, need for long-term IV antibiotic therapy. Request to IR for PICC placement. EXAM: RIGHT UPPER EXTREMITY PICC LINE PLACEMENT WITH ULTRASOUND AND FLUOROSCOPIC GUIDANCE MEDICATIONS: 4 mL 1% lidocaine ANESTHESIA/SEDATION: None. FLUOROSCOPY TIME:  Fluoroscopy Time: 2 minutes 48 seconds (1 mGy). COMPLICATIONS: None immediate. PROCEDURE: The patient was advised of the possible risks and complications and agreed to undergo the procedure. The patient was then brought to the angiographic suite for the procedure. The right arm was prepped with chlorhexidine, draped in the usual sterile fashion using maximum barrier technique (cap and mask, sterile gown, sterile gloves, large sterile sheet, hand hygiene and cutaneous antisepsis) and infiltrated locally with 1% Lidocaine. Ultrasound demonstrated patency of the right basilic vein, and this was documented with an image. Under real-time ultrasound guidance, this vein was accessed with a 21 gauge micropuncture needle and image documentation was performed. A 0.018 wire was introduced in to the vein. Over this, a 5 FPakistandouble lumen power injectable PICC was advanced to the lower SVC/right atrial junction. Fluoroscopy during the procedure and fluoro spot radiograph confirms appropriate catheter position. The catheter was flushed, aspirated, capped and covered with a sterile dressing. Catheter length: 36 cm IMPRESSION: Successful right arm power PICC line placement with ultrasound and fluoroscopic guidance. The catheter is ready for use. Read by SCandiss Norse PA-C Electronically Signed   By: DRuthann CancerMD   On: 09/20/2020 14:01    Scheduled Meds: . [MAR Hold] acetaminophen  1,000 mg Oral Q8H  . [MAR Hold] amLODipine  10 mg Oral Daily  . [  MAR Hold] atorvastatin  40 mg Oral Daily  . [MAR Hold] Chlorhexidine Gluconate Cloth  6 each Topical Daily  . [MAR Hold] docusate  sodium  100 mg Oral BID  . [MAR Hold] DULoxetine  90 mg Oral Daily  . [MAR Hold] enoxaparin (LOVENOX) injection  65 mg Subcutaneous Q24H  . [MAR Hold] insulin aspart  0-20 Units Subcutaneous TID WC  . [MAR Hold] insulin aspart  0-5 Units Subcutaneous QHS  . [MAR Hold] insulin aspart  15 Units Subcutaneous TID WC  . [MAR Hold] insulin detemir  40 Units Subcutaneous BID  . [MAR Hold] levothyroxine  75 mcg Oral Q0600  . [MAR Hold] losartan  50 mg Oral Daily  . [MAR Hold] multivitamin with minerals  1 tablet Oral Daily  . [MAR Hold] Ensure Max Protein  11 oz Oral QHS  . [MAR Hold] senna-docusate  1 tablet Oral BID  . [MAR Hold] traMADol  50 mg Oral Q8H   Continuous Infusions: . lactated ringers 10 mL/hr at 09/21/20 1134  . [MAR Hold] penicillin g continuous IV infusion 12 Million Units (09/21/20 0554)     LOS: 9 days    Time spent: 25 mins    Ellsie Violette, MD [facility]   If 7PM-7AM, please contact night-coverage

## 2020-09-22 ENCOUNTER — Encounter (HOSPITAL_COMMUNITY): Payer: Self-pay | Admitting: Podiatry

## 2020-09-22 DIAGNOSIS — Z89431 Acquired absence of right foot: Secondary | ICD-10-CM

## 2020-09-22 LAB — GLUCOSE, CAPILLARY
Glucose-Capillary: 244 mg/dL — ABNORMAL HIGH (ref 70–99)
Glucose-Capillary: 389 mg/dL — ABNORMAL HIGH (ref 70–99)
Glucose-Capillary: 356 mg/dL — ABNORMAL HIGH (ref 70–99)
Glucose-Capillary: 216 mg/dL — ABNORMAL HIGH (ref 70–99)

## 2020-09-22 LAB — BASIC METABOLIC PANEL
Anion gap: 9 (ref 5–15)
BUN: 15 mg/dL (ref 8–23)
CO2: 29 mmol/L (ref 22–32)
Calcium: 9 mg/dL (ref 8.9–10.3)
Chloride: 96 mmol/L — ABNORMAL LOW (ref 98–111)
Creatinine, Ser: 0.83 mg/dL (ref 0.44–1.00)
GFR, Estimated: >60 mL/min (ref >60)
Glucose, Bld: 266 mg/dL — ABNORMAL HIGH (ref 70–99)
Potassium: 4.8 mmol/L (ref 3.5–5.1)
Sodium: 134 mmol/L — ABNORMAL LOW (ref 135–145)

## 2020-09-22 NOTE — Evaluation (Signed)
Physical Therapy Re-Evaluation Patient Details Name: Brittany Silva MRN: [id number] DOB: 1956/08/16 Today's Date: 09/22/2020   History of Present Illness  Pt is a 64 y.o. female admitted 09/11/20 with 5 days of back pain, BLE pain, nausea/vomiting. MRI with possible epidural abscess s/p L4-5 facet aspiration 10/14. Pt also with acute osteomyelitis of L 4th toe s/p L 4th toe amputation 10/21. PMH includes retinopathy, OA, HTN, DM, anxiety.    Clinical Impression  Pt seen for re-evaluation now s/p L 4th toe amputation with change in WB status. Pt presents with an overall decrease in functional mobility secondary to above, including impaired balance strategies, pain and generalized weakness. PTA, pt independent and lives with family who is not able to provide necessary level of assistance. Educ on precautions, positioning, post-op shoe wear, therex and importance of mobility. Today, pt able to perform transfer and gait training with RW, requiring assist to maintain balance; unable to tolerate wearing darco shoe for L foot WB thru heel (will try a different shoe next session). Pt would benefit from continued acute PT services to maximize functional mobility and independence prior to d/c with SNF-level therapies; pt unsure, but willing to consider SNF as she is unable to mobilize safely and care for self at home.     Follow Up Recommendations SNF;Supervision for mobility/OOB    Equipment Recommendations  Rolling walker with 5" wheels;3in1 (PT);Hospital bed;Other (comment) (pt requests SCDs for home use to aid BLE swelling)    Recommendations for Other Services       Precautions / Restrictions Precautions Precautions: Fall Restrictions Weight Bearing Restrictions: Yes LLE Weight Bearing: Partial weight bearing Other Position/Activity Restrictions: Per podiatry note 10/22: "...can be WBAT in surgical shoe. I did discuss with her that ultimately for the first 2-3 weeks I would like her to be PWB  to heel if possible."      Mobility  Bed Mobility Overal bed mobility: Modified Independent Bed Mobility: Supine to Sit;Sit to Supine                Transfers Overall transfer level: Needs assistance Equipment used: Rolling walker (2 wheeled) Transfers: Sit to/from Stand Sit to Stand: Min guard;Min assist         General transfer comment: Reliant on momentum to power into standing EOB to RW, cues for hand placement; multiple stands from EOB and BSC to RW with min guard; trial with darco shoe, pt requiring minA to maintain balance  Ambulation/Gait Ambulation/Gait assistance: Min guard;Min assist Gait Distance (Feet): 12 Feet Assistive device: Rolling walker (2 wheeled) Gait Pattern/deviations: Step-to pattern;Decreased weight shift to left;Trunk flexed;Antalgic Gait velocity: Decreased   General Gait Details: Darco initially not delivered, pt taking pivotal steps from bed<>BSC with RW and min guard for balance, cues to minimize WB through L foot as much as possible. Darco delivered, additional gait trial with it and RW, pt requiring intermittent minA to maintain stability; onset of significant L thigh/lower back pain requiring return to sit  Stairs            Wheelchair Mobility    Modified Rankin (Stroke Patients Only)       Balance Overall balance assessment: Needs assistance Sitting-balance support: No upper extremity supported;Feet supported Sitting balance-Leahy Scale: Good Sitting balance - Comments: Indep with pericare while sitting on BSC   Standing balance support: Bilateral upper extremity supported;During functional activity Standing balance-Leahy Scale: Poor Standing balance comment: reliant on UE support  Pertinent Vitals/Pain Pain Assessment: Faces Faces Pain Scale: Hurts whole lot Pain Location: L posterior thigh into lower back(?) with attempts at using darco shoe Pain Descriptors / Indicators:  Grimacing;Discomfort;Spasm;Throbbing;Moaning Pain Intervention(s): Monitored during session;Limited activity within patient's tolerance;Patient requesting pain meds-RN notified    Home Living Family/patient expects to be discharged to:: Private residence Living Arrangements: Spouse/significant other;Children Available Help at Discharge: Family;Available PRN/intermittently Type of Home: House Home Access: Stairs to enter   Entrance Stairs-Number of Steps: 1 Home Layout: Able to live on main level with bedroom/bathroom Home Equipment: Grab bars - tub/shower;Walker - 2 wheels;Cane - single point Additional Comments: Husband uses SPC and unable to physically assist; children work during day. Has large RW that will not fit well around house, no RW will fit into bathroom    Prior Function Level of Independence: Needs assistance   Gait / Transfers Assistance Needed: Independent, denies falls  ADL's / Homemaking Assistance Needed: independent with ADL's, needing assist with IADL's i.e. cooking the past couple months        Hand Dominance   Dominant Hand: Right    Extremity/Trunk Assessment   Upper Extremity Assessment Upper Extremity Assessment: Overall WFL for tasks assessed    Lower Extremity Assessment Lower Extremity Assessment: Generalized weakness;RLE deficits/detail;LLE deficits/detail RLE Deficits / Details: Grossly 4/5 throughout RLE Sensation: history of peripheral neuropathy LLE Deficits / Details: s/p L 4th toe amputation; hip and knee functionally at least 3/5 LLE Sensation: history of peripheral neuropathy       Communication   Communication: No difficulties  Cognition Arousal/Alertness: Awake/alert Behavior During Therapy: WFL for tasks assessed/performed Overall Cognitive Status: Within Functional Limits for tasks assessed                                        General Comments General comments (skin integrity, edema, etc.): Husband present  at beginning of session. Discussed various post-surgical shoe options as pt utlimately not feeling steady in darco shoe and reports it made pain worse; will trial a different shoe next session (ortho tech to deliver). Also discussed home vs. SNF as pt currently not able to mobilize mod indep or care for self (reports family not available to assist) - pt open to consideration    Exercises     Assessment/Plan    PT Assessment Patient needs continued PT services  PT Problem List Decreased strength;Decreased activity tolerance;Decreased balance;Decreased mobility;Pain;Decreased knowledge of use of DME;Decreased knowledge of precautions;Obesity       PT Treatment Interventions DME instruction;Gait training;Stair training;Functional mobility training;Therapeutic activities;Therapeutic exercise;Balance training;Patient/family education    PT Goals (Current goals can be found in the Care Plan section)  Acute Rehab PT Goals Patient Stated Goal: I need to be able to take of myself and the house, get back to my dogs PT Goal Formulation: With patient Time For Goal Achievement: 10/06/20 Potential to Achieve Goals: Good    Frequency Min 3X/week   Barriers to discharge Decreased caregiver support;Inaccessible home environment      Co-evaluation               AM-PAC PT "6 Clicks" Mobility  Outcome Measure Help needed turning from your back to your side while in a flat bed without using bedrails?: None Help needed moving from lying on your back to sitting on the side of a flat bed without using bedrails?: None Help needed moving to and from  a bed to a chair (including a wheelchair)?: A Little Help needed standing up from a chair using your arms (e.g., wheelchair or bedside chair)?: A Little Help needed to walk in hospital room?: A Little Help needed climbing 3-5 steps with a railing? : A Lot 6 Click Score: 19    End of Session   Activity Tolerance: Patient limited by pain Patient left:  in bed;with bed alarm set;with call bell/phone within reach Nurse Communication: Mobility status;Patient requests pain meds PT Visit Diagnosis: Unsteadiness on feet (R26.81);Muscle weakness (generalized) (M62.81);Difficulty in walking, not elsewhere classified (R26.2);Pain Pain - Right/Left: Right Pain - part of body: Leg;Ankle and joints of foot    Time: 2060-1561 PT Time Calculation (min) (ACUTE ONLY): 34 min   Charges:   PT Evaluation $PT Re-evaluation: 1 Re-eval PT Treatments $Therapeutic Activity: 8-22 mins $Self Care/Home Management: 8-22   Mabeline Caras, PT, DPT Acute Rehabilitation Services  Pager [phone] Office [place] 09/22/2020, 5:13 PM

## 2020-09-22 NOTE — Progress Notes (Addendum)
  Subjective:  Patient ID: Brittany Silva, female    DOB: 1956/09/09,  MRN: [id number]  A 64 y.o. female presents with left fourth digit OM s/p left partial fourth digit amputation. Patient states that she is doing well overall. She denies any other acute complaints. She denies any nausea fever chills vomiting. No pain from the toe. Objective:   Vitals:   09/21/20 2049 09/22/20 0423  BP: (!) 144/70 (!) 143/65  Pulse: 89 74  Resp: 19 19  Temp: 98.7 F (37.1 C) 98.3 F (36.8 C)  SpO2: 93% 97%   General AA&O x3. Normal mood and affect.  Vascular Dorsalis pedis and posterior tibial pulses 2/4 bilat. Brisk capillary refill to all digits. Pedal hair present.  Neurologic Epicritic sensation grossly intact.  Dermatologic  bandages are clean dry and intact. No strikethrough noted. No calf pain noted. Motor or sensory functions are intact to the remaining toes.  Orthopedic: MMT 5/5 in dorsiflexion, plantarflexion, inversion, and eversion. Normal joint ROM without pain or crepitus.    Assessment & Plan:  Patient was evaluated and treated and all questions answered.  Left fourth digit osteomyelitis status post partial toe amputation primarily closed -All questions and concerns were addressed in extensive detail with the patient. -From podiatric standpoint she does not need any antibiotics as I was able to achieve surgical cure via amputation. However, will defer antibiotic recommendations to infectious disease -No dressing changes until follow-up. -Patient can follow-up in our clinic in [place] [facility] 1 week from discharge. -She can be weightbearing as tolerated in a surgical shoe. I did discuss with her that ultimately for the first 2 to 3 weeks I would like her to be partial weightbearing to the heel if possible. -OT consult was ordered for surgical shoe as patient still has not received it.  Felipa Furnace, DPM  Accessible via secure chat for questions or  concerns.

## 2020-09-22 NOTE — Progress Notes (Signed)
Subjective: No new complaints   Antibiotics:  Anti-infectives (From admission, onward)   Start     Dose/Rate Route Frequency Ordered Stop   09/17/20 1600  penicillin G potassium 12 Million Units in dextrose 5 % 500 mL continuous infusion        12 Million Units 41.7 mL/hr over 12 Hours Intravenous Every 12 hours 09/17/20 0959     09/15/20 1500  cefTRIAXone (ROCEPHIN) 2 g in sodium chloride 0.9 % 100 mL IVPB  Status:  Discontinued        2 g 200 mL/hr over 30 Minutes Intravenous Every 24 hours 09/15/20 1049 09/17/20 0959   09/15/20 0400  vancomycin (VANCOREADY) IVPB 1250 mg/250 mL  Status:  Discontinued        1,250 mg 166.7 mL/hr over 90 Minutes Intravenous Every 12 hours 09/14/20 1416 09/17/20 0959   09/14/20 1500  vancomycin (VANCOCIN) 2,500 mg in sodium chloride 0.9 % 500 mL IVPB        2,500 mg 250 mL/hr over 120 Minutes Intravenous  Once 09/14/20 1410 09/14/20 1901   09/14/20 1430  metroNIDAZOLE (FLAGYL) IVPB 500 mg  Status:  Discontinued        500 mg 100 mL/hr over 60 Minutes Intravenous Every 8 hours 09/14/20 1358 09/15/20 1343   09/14/20 1430  ceFEPIme (MAXIPIME) 2 g in sodium chloride 0.9 % 100 mL IVPB  Status:  Discontinued        2 g 200 mL/hr over 30 Minutes Intravenous Every 8 hours 09/14/20 1410 09/15/20 1049      Medications: Scheduled Meds: . acetaminophen  1,000 mg Oral Q8H  . amLODipine  10 mg Oral Daily  . atorvastatin  40 mg Oral Daily  . Chlorhexidine Gluconate Cloth  6 each Topical Daily  . docusate sodium  100 mg Oral BID  . DULoxetine  90 mg Oral Daily  . enoxaparin (LOVENOX) injection  65 mg Subcutaneous Q24H  . insulin aspart  0-20 Units Subcutaneous TID WC  . insulin aspart  0-5 Units Subcutaneous QHS  . insulin aspart  15 Units Subcutaneous TID WC  . insulin detemir  40 Units Subcutaneous BID  . levothyroxine  75 mcg Oral Q0600  . losartan  50 mg Oral Daily  . multivitamin with minerals  1 tablet Oral Daily  . Ensure Max Protein   11 oz Oral QHS  . senna-docusate  1 tablet Oral BID  . traMADol  50 mg Oral Q8H   Continuous Infusions: . lactated ringers 10 mL/hr at 09/21/20 1134  . penicillin g continuous IV infusion 12 Million Units (09/22/20 1111)   PRN Meds:.hydrALAZINE, HYDROmorphone (DILAUDID) injection, lidocaine (PF), ondansetron **OR** ondansetron (ZOFRAN) IV, polyethylene glycol, sodium chloride flush    Objective: Weight change:   Intake/Output Summary (Last 24 hours) at 09/22/2020 1147 Last data filed at 09/22/2020 1100 Gross per 24 hour  Intake 3813.47 ml  Output 1504 ml  Net 2309.47 ml   Blood pressure 137/70, pulse 91, temperature 98.1 F (36.7 C), temperature source Oral, resp. rate 15, height '5\' 7"'  (1.702 m), weight 129.3 kg, SpO2 93 %. Temp:  [97.4 F (36.3 C)-98.7 F (37.1 C)] 98.1 F (36.7 C) (10/22 0908) Pulse Rate:  [74-91] 91 (10/22 0908) Resp:  [14-20] 15 (10/22 0908) BP: (128-158)/(61-76) 137/70 (10/22 0938) SpO2:  [93 %-100 %] 93 % (10/22 0908)  Physical Exam: General: Alert and awake, oriented x3, not in any acute distress. HEENT: anicteric sclera, EOMI CVS regular  rate, normal  Chest: , no wheezing, no respiratory distress Abdomen: soft non-distended,  y Skin: Ulceration under fourth toe Neuro: nonfocal  CBC:    BMET Recent Labs    09/21/20 0418 09/22/20 0405  NA 130* 134*  K 3.9 4.8  CL 95* 96*  CO2 25 29  GLUCOSE 187* 266*  BUN 10 15  CREATININE 0.66 0.83  CALCIUM 8.7* 9.0     Liver Panel  Recent Labs    09/20/20 0210  ALBUMIN 2.3*       Sedimentation Rate No results for input(s): ESRSEDRATE in the last 72 hours. C-Reactive Protein No results for input(s): CRP in the last 72 hours.  Micro Results: Recent Results (from the past 720 hour(s))  Urine culture     Status: Abnormal   Collection Time: 09/08/20 10:30 PM   Specimen: Urine, Random  Result Value Ref Range Status   Specimen Description   Final    URINE, RANDOM Performed at [facility], ., [place], [place] [address]    Special Requests   Final    NONE Performed at [facility], [address] [place], [place] [address]    Culture 80,000 COLONIES/mL ESCHERICHIA COLI (A)  Final   Report Status 09/11/2020 FINAL  Final   Organism ID, Bacteria ESCHERICHIA COLI (A)  Final      Susceptibility   Escherichia coli - MIC*    AMPICILLIN >=32 RESISTANT Resistant     CEFAZOLIN <=4 SENSITIVE Sensitive     CEFTRIAXONE <=0.25 SENSITIVE Sensitive     CIPROFLOXACIN <=0.25 SENSITIVE Sensitive     GENTAMICIN <=1 SENSITIVE Sensitive     IMIPENEM <=0.25 SENSITIVE Sensitive     NITROFURANTOIN <=16 SENSITIVE Sensitive     TRIMETH/SULFA <=20 SENSITIVE Sensitive     AMPICILLIN/SULBACTAM 8 SENSITIVE Sensitive     PIP/TAZO <=4 SENSITIVE Sensitive     * 80,000 COLONIES/mL ESCHERICHIA COLI  Respiratory Panel by RT PCR (Flu A&B, Covid) - Nasopharyngeal Swab     Status: None   Collection Time: 09/12/20  1:43 PM   Specimen: Nasopharyngeal Swab  Result Value Ref Range Status   SARS Coronavirus 2 by RT PCR NEGATIVE NEGATIVE Final    Comment: (NOTE) SARS-CoV-2 target nucleic acids are NOT DETECTED.  The SARS-CoV-2 RNA is generally detectable in upper respiratoy specimens during the acute phase of infection. The lowest concentration of SARS-CoV-2 viral copies this assay can detect is 131 copies/mL. A negative result does not preclude SARS-Cov-2 infection and should not be used as the sole basis for treatment or other patient management decisions. A negative result may occur with  improper specimen collection/handling, submission of specimen other than nasopharyngeal swab, presence of viral mutation(s) within the areas targeted by this assay, and inadequate number of viral copies (<131 copies/mL). A negative result must be combined with clinical observations, patient history, and epidemiological information. The expected result is Negative.  Fact  Sheet for Patients:  PinkCheek.be  Fact Sheet for Healthcare Providers:  GravelBags.it  This test is no t yet approved or cleared by the [place] FDA and  has been authorized for detection and/or diagnosis of SARS-CoV-2 by FDA under an Emergency Use Authorization (EUA). This EUA will remain  in effect (meaning this test can be used) for the duration of the COVID-19 declaration under Section 564(b)(1) of the Act, 21 U.S.C. section 360bbb-3(b)(1), unless the authorization is terminated or revoked sooner.     Influenza A by  PCR NEGATIVE NEGATIVE Final   Influenza B by PCR NEGATIVE NEGATIVE Final    Comment: (NOTE) The Xpert Xpress SARS-CoV-2/FLU/RSV assay is intended as an aid in  the diagnosis of influenza from Nasopharyngeal swab specimens and  should not be used as a sole basis for treatment. Nasal washings and  aspirates are unacceptable for Xpert Xpress SARS-CoV-2/FLU/RSV  testing.  Fact Sheet for Patients: PinkCheek.be  Fact Sheet for Healthcare Providers: GravelBags.it  This test is not yet approved or cleared by the [place] FDA and  has been authorized for detection and/or diagnosis of SARS-CoV-2 by  FDA under an Emergency Use Authorization (EUA). This EUA will remain  in effect (meaning this test can be used) for the duration of the  Covid-19 declaration under Section 564(b)(1) of the Act, 21  U.S.C. section 360bbb-3(b)(1), unless the authorization is  terminated or revoked. Performed at [facility], [place] [address], [place], [place] [address]   Aerobic/Anaerobic Culture (surgical/deep wound)     Status: None   Collection Time: 09/14/20  1:52 PM   Specimen: Abscess; Synovial Fluid  Result Value Ref Range Status   Specimen Description ABSCESS  Final   Special Requests RIGHT L4 L5 JOINT  Final   Gram Stain   Final     ABUNDANT WBC PRESENT, PREDOMINANTLY PMN MODERATE GRAM POSITIVE COCCI IN PAIRS IN CHAINS    Culture   Final    FEW STREPTOCOCCUS MITIS/ORALIS NO ANAEROBES ISOLATED Performed at [facility], [address], [place], [place] [address]    Report Status 09/19/2020 FINAL  Final   Organism ID, Bacteria STREPTOCOCCUS MITIS/ORALIS  Final      Susceptibility   Streptococcus mitis/oralis - MIC*    TETRACYCLINE 0.5 SENSITIVE Sensitive     VANCOMYCIN 0.5 SENSITIVE Sensitive     CLINDAMYCIN 0.5 INTERMEDIATE Intermediate     PENICILLIN Value in next row Sensitive      SENSITIVE0.06    CEFTRIAXONE Value in next row Sensitive      SENSITIVE0.12    * FEW STREPTOCOCCUS MITIS/ORALIS  Surgical pcr screen     Status: None   Collection Time: 09/20/20  5:42 PM   Specimen: Nasal Mucosa; Nasal Swab  Result Value Ref Range Status   MRSA, PCR NEGATIVE NEGATIVE Final   Staphylococcus aureus NEGATIVE NEGATIVE Final    Comment: (NOTE) The Xpert SA Assay (FDA approved for NASAL specimens in patients 14 years of age and older), is one component of a comprehensive surveillance program. It is not intended to diagnose infection nor to guide or monitor treatment. Performed at [facility], [place] [address], [place], [place] [address]     Studies/Results: [place] Foot Complete Left  Result Date: 09/21/2020 CLINICAL DATA:  Status post fourth toe amputation. EXAM: LEFT FOOT - COMPLETE 3+ VIEW COMPARISON:  None. FINDINGS: Postsurgical changes are seen involving the distal tibia and fibula. Severe degenerative changes seen involving the talotibial joint. Status post amputation of the fourth toe at the level of the proximal base of the fourth proximal phalanx. No other fracture or dislocation is noted. IMPRESSION: Status post amputation of the fourth toe at the level of the proximal base of the fourth proximal phalanx. 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 Electronically Signed   By: Marijo Conception M.D.   On: 09/21/2020 13:12   IRPICC PLACEMENT LEFT >5 YRS INC IMG GUIDE  Result Date: 09/20/2020 INDICATION: Patient history of poor venous access, need for  long-term IV antibiotic therapy. Request to IR for PICC placement. EXAM: RIGHT UPPER EXTREMITY PICC LINE PLACEMENT WITH ULTRASOUND AND FLUOROSCOPIC GUIDANCE MEDICATIONS: 4 mL 1% lidocaine ANESTHESIA/SEDATION: None. FLUOROSCOPY TIME:  Fluoroscopy Time: 2 minutes 48 seconds (1 mGy). COMPLICATIONS: None immediate. PROCEDURE: The patient was advised of the possible risks and complications and agreed to undergo the procedure. The patient was then brought to the angiographic suite for the procedure. The right arm was prepped with chlorhexidine, draped in the usual sterile fashion using maximum barrier technique (cap and mask, sterile gown, sterile gloves, large sterile sheet, hand hygiene and cutaneous antisepsis) and infiltrated locally with 1% Lidocaine. Ultrasound demonstrated patency of the right basilic vein, and this was documented with an image. Under real-time ultrasound guidance, this vein was accessed with a 21 gauge micropuncture needle and image documentation was performed. A 0.018 wire was introduced in to the vein. Over this, a 5 [place] double lumen power injectable PICC was advanced to the lower SVC/right atrial junction. Fluoroscopy during the procedure and fluoro spot radiograph confirms appropriate catheter position. The catheter was flushed, aspirated, capped and covered with a sterile dressing. Catheter length: 36 cm IMPRESSION: Successful right arm power PICC line placement with ultrasound and fluoroscopic guidance. The catheter is ready for use. Read by Candiss Norse, PA-C Electronically Signed   By: Ruthann Cancer MD   On: 09/20/2020 14:01      Assessment/Plan:  INTERVAL HISTORY:   She is sp partial fourth toe amputation.  Active Problems:   Type 2 diabetes mellitus with hyperglycemia (HCC)   Abscess in epidural  space of lumbar spine   Diabetic foot ulcer (HCC)   Ankle pain   Epidural abscess   S/P amputation of foot, right ([place])    Brittany Silva is a 63 y.o. female with  L4-5 facet joint and epidural abscess.  Status post IR guided aspiration with Strep mitis/oralis S to PCN and now on high dose penicillin. She has osteo of her 4th digit and to have amputation today  --continue PCN with followup with me in [facility] clinic  Diagnosis: Epidural abscess  Culture Result: Streptococcus mitis  Allergies  Allergen Reactions  . Hydrochlorothiazide     rash  . Lisinopril     Other reaction(s): Cough  . Adhesive [Tape]   . Codeine Phosphate     REACTION: throat swelling  . Darvon Other (See Comments)    Jittering, skin problems  . Darvon [Propoxyphene Hcl]     Hyperactivity; "makes me crazy"  . Hydrocodone-Acetaminophen Itching    *Has tolerated hydromorphone as an adult* Per pt, when she was a child had throat itching, difficulty breathing and feel strange and also vomit   . Propoxyphene Hcl     REACTION: Makes her crazy  . Latex Rash  . Neosporin [Neomycin-Bacitracin Zn-Polymyx] Rash  . Sulfamethoxazole Itching and Rash    OPAT Orders Discharge antibiotics:  PCN Duration:  Roughly 6 weeks   End Date:  Number 23rd 2021.   [facility] Per Protocol:   Labs  weekly while on IV antibiotics: _x_ CBC with differential x__ BMP w GFR/CMP _x_ CRP _x_ ESR    x_ Please pull PIC at completion of IV antibiotics __ Please leave PIC in place until doctor has seen patient or been notified  Fax weekly labs to [phone]   Brittany Silva has an appointment on [facility] 22nd, 2021 at 1015 AM with Dr. Tommy Medal  The [facility] for Infectious Disease is  located in the [facility] at  [place] in [place].  [address], which is located to the left of the elevators.  Phone: [phone]  Fax: [phone]  [email or web address]   I will sign off for now please call with further questions.    LOS: 10 days   Alcide Evener 09/22/2020, 11:47 AM

## 2020-09-22 NOTE — Plan of Care (Signed)

## 2020-09-22 NOTE — Progress Notes (Signed)
PROGRESS NOTE    Brittany Silva  MRN:[id number] DOB: 01/26/1956 DOA: 09/11/2020 PCP: Smith, Candace, MD   Brief Narrative: 64-year-old female with history of chronic back pain, DM-2, HTN, hypothyroidism, morbid obesity and recent UTI presenting with 5 days hx. of lower back pain, bilateral leg pain, nausea, vomiting, fever and chills. MRI lumbar spine concerning for lumbar epidural abscess around the L5-S1 disc space and soft tissue enhancement on the right posterior to the L4-5 and L5-S1 facet joints.  Neurosurgery consulted, and recommended transfer to [place].  Concerned that a diabetic foot ulcer in her left fourth toe could be source of infection. She also has hardwares in her left foot.  The next day, evaluated by neurosurgery who recommended IR biopsy for tissue diagnosis and ID consult for antibiotics. Patient underwent fluoroscopy guided needle aspiration.  Tissue culture sent.  Started on IV vancomycin, cefepime and Flagyl which was deescalated to vancomycin and ceftriaxone, then to IV penicillin G.  MRI left foot concerning for Charcot arthropathy versus osteomyelitis but didn't include the toe with concern. Normal ABI but abnormal TBI.  Repeat MRI of left foot consistent with acute osteomyelitis.  Podiatry consulted , underwent left fourth toe amputation. Patient has PICC line for prolonged IV antibiotics. ID recommended 6 weeks of Penicillin G , last day Nov 23, 21.   Assessment & Plan:   Active Problems:   Type 2 diabetes mellitus with hyperglycemia (HCC)   Abscess in epidural space of lumbar spine   Diabetic foot ulcer (HCC)   Ankle pain   Epidural abscess   S/P amputation of foot, right (HCC)  Acute on chronic low back pain/radiculopathy sec. to L5-S1 epidural abscess/ L4-5 and L5-S1 facet joint infection.  - There are no signs of cauda equina syndrome. -Imaging and ABI as above.  TTE without significant finding.  CRP 23>> 17.  ESR 85>> 77.  Mild  leukocytosis. -Neurosurgery consulted and recommended IR biopsy and ID consult for antibiotics. -Underwent fluoroscopy guided needle aspiration of the facet joints. -Initiated Vancomycin, cefepime and Flagyl on 10/14-10/15 -Continued Vancomycin and ceftriaxone 10/15>> 10/17 -Surgical tissue cultures grew few Streptococcus mitis/oralis sensitive to penicillin - Started IV penicillin G 10/17>> may need PICC line.  ID to decide timing. -Pain control scheduled Tylenol and tramadol, and IV Dilaudid for breakthrough pain -Bowel regimen with as needed MiraLAX and Senokot-S -Repeat MRI left foot shows acute osteomyelitis of the distal phalanx of left fourth toe. -Podiatry consulted, underwent left fourth toe amputation tolerated well. -As per podiatry she does not need antibiotics from foot standpoint of view.   -ID recommended 6 weeks of penicillin G,  last day of antibiotic would be November 23. -PT/OT eval   DM-2 with hypo- and hyperglycemia: On Levemir 75 units twice daily and SSI.  A1c 8.6%. -Continue SSI-high.  -Increased NovoLog AC from 12 to 15 units. -Continue Levemir 40 units twice daily. -Continue atorvastatin.  Essential hypertension:  -Continue home amlodipine. -Resume home losartan.  Hypothyroidism -Continue home Synthroid.  Hypokalemia-resolved.  Hyponatremia:  Na 132- 136 - 134 today. Recheck labs in am   Acute cystitis-treated with IV ceftriaxone x1 and Keflex x3 days -Should be covered with antibiotics as above.  Morbid obesity Body mass index is 44.64 kg/m    DVT prophylaxis: SCDS. Code Status: Full Family Communication: No one available at bedside. Disposition Plan:   Status is: Inpatient  Remains inpatient appropriate because:Inpatient level of care appropriate due to severity of illness   Dispo: The patient is   from: Home              Anticipated d/c is to: Home with home services.              Anticipated d/c date is: 10/23              Patient  currently is not medically stable to d/c.    Consultants:   Neurosurgery-signed off  IR-off  Infectious disease  Procedures: 10/14-fluoroscopy guided needle aspiration of facet joints  Antimicrobials:  Anti-infectives (From admission, onward)   Start     Dose/Rate Route Frequency Ordered Stop   09/17/20 1600  penicillin G potassium 12 Million Units in dextrose 5 % 500 mL continuous infusion        12 Million Units 41.7 mL/hr over 12 Hours Intravenous Every 12 hours 09/17/20 0959     09/15/20 1500  cefTRIAXone (ROCEPHIN) 2 g in sodium chloride 0.9 % 100 mL IVPB  Status:  Discontinued        2 g 200 mL/hr over 30 Minutes Intravenous Every 24 hours 09/15/20 1049 09/17/20 0959   09/15/20 0400  vancomycin (VANCOREADY) IVPB 1250 mg/250 mL  Status:  Discontinued        1,250 mg 166.7 mL/hr over 90 Minutes Intravenous Every 12 hours 09/14/20 1416 09/17/20 0959   09/14/20 1500  vancomycin (VANCOCIN) 2,500 mg in sodium chloride 0.9 % 500 mL IVPB        2,500 mg 250 mL/hr over 120 Minutes Intravenous  Once 09/14/20 1410 09/14/20 1901   09/14/20 1430  metroNIDAZOLE (FLAGYL) IVPB 500 mg  Status:  Discontinued        500 mg 100 mL/hr over 60 Minutes Intravenous Every 8 hours 09/14/20 1358 09/15/20 1343   09/14/20 1430  ceFEPIme (MAXIPIME) 2 g in sodium chloride 0.9 % 100 mL IVPB  Status:  Discontinued        2 g 200 mL/hr over 30 Minutes Intravenous Every 8 hours 09/14/20 1410 09/15/20 1049     Subjective: Patient was seen and examined at bedside.  Overnight events noted.  She reports pain is better controlled, She had 2 bowel movements so far.   She tolerated left fourth toe amputation yesterday,  reports pain is controlled.   Objective: Vitals:   09/21/20 2049 09/22/20 0423 09/22/20 0908 09/22/20 0938  BP: (!) 144/70 (!) 143/65 137/70 137/70  Pulse: 89 74 91   Resp: 19 19 15   Temp: 98.7 F (37.1 C) 98.3 F (36.8 C) 98.1 F (36.7 C)   TempSrc: Oral Oral Oral   SpO2: 93% 97%  93%   Weight:      Height:        Intake/Output Summary (Last 24 hours) at 09/22/2020 1356 Last data filed at 09/22/2020 1100 Gross per 24 hour  Intake 3313.47 ml  Output 1501 ml  Net 1812.47 ml   Filed Weights   09/13/20 0739 09/21/20 1113  Weight: 129.3 kg 129.3 kg    Examination:  General exam: Appears calm and comfortable. Respiratory system: Clear to auscultation. Respiratory effort normal. Cardiovascular system: S1 & S2 heard, RRR. No JVD, murmurs, rubs, gallops or clicks. No pedal edema. Gastrointestinal system: Abdomen is nondistended, soft and nontender. No organomegaly or masses felt. Normal bowel sounds heard. Central nervous system: Alert and oriented. No focal neurological deficits. Extremities: No edema no cyanosis no clubbing.  Dressing noted over Lt 4 toe. Skin: No rashes, lesions or ulcers Psychiatry: Judgement and insight appear normal. Mood &   affect appropriate.     Data Reviewed: I have personally reviewed following labs and imaging studies  CBC: Recent Labs  Lab 09/16/20 0155 09/18/20 0359 09/20/20 0210 09/21/20 0418  WBC 11.5* 11.3* 8.4 8.6  NEUTROABS 9.1*  --  6.3  --   HGB 11.7* 12.1 11.5* 11.6*  HCT 35.9* 36.5 34.9* 34.7*  MCV 89.1 87.7 87.7 87.6  PLT 407* 425* 424* 540   Basic Metabolic Panel: Recent Labs  Lab 09/16/20 0155 09/16/20 0155 09/18/20 0359 09/19/20 0253 09/20/20 0210 09/21/20 0418 09/22/20 0405  NA 132*  --  132*  --  136 130* 134*  K 3.6  --  4.1  --  3.8 3.9 4.8  CL 97*  --  97*  --  98 95* 96*  CO2 26  --  24  --  _0 GLUCOSE 230*  --  353*  --  109* 187* 266*  BUN 16  --  12  --  _1 CREATININE 0.74   < > 0.63 0.59 0.59 0.66 0.83  CALCIUM 8.4*  --  8.4*  --  8.8* 8.7* 9.0  MG 2.1  --  2.0  --  1.9 1.8  --   PHOS 3.5  --  2.9  --  3.6 2.9  --    < > = values in this interval not displayed.   GFR: Estimated Creatinine Clearance: 95.9 mL/min (by C-G formula based on SCr of 0.83 mg/dL). Liver  Function Tests: Recent Labs  Lab 09/16/20 0155 09/18/20 0359 09/20/20 0210  ALBUMIN 2.2* 2.3* 2.3*   No results for input(s): LIPASE, AMYLASE in the last 168 hours. No results for input(s): AMMONIA in the last 168 hours. Coagulation Profile: No results for input(s): INR, PROTIME in the last 168 hours. Cardiac Enzymes: No results for input(s): CKTOTAL, CKMB, CKMBINDEX, TROPONINI in the last 168 hours. BNP (last 3 results) No results for input(s): PROBNP in the last 8760 hours. HbA1C: No results for input(s): HGBA1C in the last 72 hours. CBG: Recent Labs  Lab 09/21/20 1245 09/21/20 1611 09/21/20 2040 09/22/20 0649 09/22/20 1139  GLUCAP 153* 244* 346* 244* 389*   Lipid Profile: No results for input(s): CHOL, HDL, LDLCALC, TRIG, CHOLHDL, LDLDIRECT in the last 72 hours. Thyroid Function Tests: No results for input(s): TSH, T4TOTAL, FREET4, T3FREE, THYROIDAB in the last 72 hours. Anemia Panel: No results for input(s): VITAMINB12, FOLATE, FERRITIN, TIBC, IRON, RETICCTPCT in the last 72 hours. Sepsis Labs: No results for input(s): PROCALCITON, LATICACIDVEN in the last 168 hours.  Recent Results (from the past 240 hour(s))  Aerobic/Anaerobic Culture (surgical/deep wound)     Status: None   Collection Time: 09/14/20  1:52 PM   Specimen: Abscess; Synovial Fluid  Result Value Ref Range Status   Specimen Description ABSCESS  Final   Special Requests RIGHT L4 L5 JOINT  Final   Gram Stain   Final    ABUNDANT WBC PRESENT, PREDOMINANTLY PMN MODERATE GRAM POSITIVE COCCI IN PAIRS IN CHAINS    Culture   Final    FEW STREPTOCOCCUS MITIS/ORALIS NO ANAEROBES ISOLATED Performed at [facility], [address] [place], [place] [address]    Report Status 09/19/2020 FINAL  Final   Organism ID, Bacteria STREPTOCOCCUS MITIS/ORALIS  Final      Susceptibility   Streptococcus mitis/oralis - MIC*    TETRACYCLINE 0.5 SENSITIVE Sensitive     VANCOMYCIN 0.5 SENSITIVE Sensitive      CLINDAMYCIN 0.5 INTERMEDIATE Intermediate  PENICILLIN Value in next row Sensitive      SENSITIVE0.06    CEFTRIAXONE Value in next row Sensitive      SENSITIVE0.12    * FEW STREPTOCOCCUS MITIS/ORALIS  Surgical pcr screen     Status: None   Collection Time: 09/20/20  5:42 PM   Specimen: Nasal Mucosa; Nasal Swab  Result Value Ref Range Status   MRSA, PCR NEGATIVE NEGATIVE Final   Staphylococcus aureus NEGATIVE NEGATIVE Final    Comment: (NOTE) The Xpert SA Assay (FDA approved for NASAL specimens in patients 22 years of age and older), is one component of a comprehensive surveillance program. It is not intended to diagnose infection nor to guide or monitor treatment. Performed at [facility], [address] [place], [place] [address]     Radiology Studies: DG Foot Complete Left  Result Date: 09/21/2020 CLINICAL DATA:  Status post fourth toe amputation. EXAM: LEFT FOOT - COMPLETE 3+ VIEW COMPARISON:  None. FINDINGS: Postsurgical changes are seen involving the distal tibia and fibula. Severe degenerative changes seen involving the talotibial joint. Status post amputation of the fourth toe at the level of the proximal base of the fourth proximal phalanx. No other fracture or dislocation is noted. IMPRESSION: Status post amputation of the fourth toe at the level of the proximal base of the fourth proximal phalanx. 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 Electronically Signed   By: James  Green Jr M.D.   On: 09/21/2020 13:12    Scheduled Meds: . acetaminophen  1,000 mg Oral Q8H  . amLODipine  10 mg Oral Daily  . atorvastatin  40 mg Oral Daily  . Chlorhexidine Gluconate Cloth  6 each Topical Daily  . docusate sodium  100 mg Oral BID  . DULoxetine  90 mg Oral Daily  . enoxaparin (LOVENOX) injection  65 mg Subcutaneous Q24H  . insulin aspart  0-20 Units Subcutaneous TID WC  . insulin aspart  0-5 Units Subcutaneous QHS  . insulin aspart  15 Units Subcutaneous  TID WC  . insulin detemir  40 Units Subcutaneous BID  . levothyroxine  75 mcg Oral Q0600  . losartan  50 mg Oral Daily  . multivitamin with minerals  1 tablet Oral Daily  . Ensure Max Protein  11 oz Oral QHS  . senna-docusate  1 tablet Oral BID  . traMADol  50 mg Oral Q8H   Continuous Infusions: . lactated ringers 10 mL/hr at 09/21/20 1134  . penicillin g continuous IV infusion 12 Million Units (09/22/20 1111)     LOS: 10 days    Time spent: 25 mins    PARDEEP KUMAR, MD [facility]   If 7PM-7AM, please contact night-coverage 

## 2020-09-22 NOTE — Progress Notes (Signed)
Orthopedic Tech Progress Note Patient Details:  Brittany Silva [birth year] [id number] OT called requesting a POST OP SHOE for patient.. when I came to apply PT was working with her and she applied the shoe Ortho Devices Type of Ortho Device: Postop shoe/boot Ortho Device/Splint Interventions: Other (comment)   Post Interventions Patient Tolerated: Other (comment) Instructions Provided: Other (comment)   Janit Pagan 09/22/2020, 4:49 PM

## 2020-09-22 NOTE — Plan of Care (Signed)

## 2020-09-23 LAB — GLUCOSE, CAPILLARY
Glucose-Capillary: 224 mg/dL — ABNORMAL HIGH (ref 70–99)
Glucose-Capillary: 251 mg/dL — ABNORMAL HIGH (ref 70–99)
Glucose-Capillary: 143 mg/dL — ABNORMAL HIGH (ref 70–99)
Glucose-Capillary: 191 mg/dL — ABNORMAL HIGH (ref 70–99)

## 2020-09-23 MED ORDER — PENICILLIN G POTASSIUM IV (FOR PTA / DISCHARGE USE ONLY): 24.0000 10*6.[IU] | INTRAVENOUS | 0 refills | Status: DC

## 2020-09-23 MED ORDER — TRAMADOL HCL 50 MG PO TABS
50.0000 mg | ORAL_TABLET | Freq: Three times a day (TID) | ORAL | 0 refills | Status: DC
Start: 2020-09-23 — End: 2024-09-10

## 2020-09-23 MED ORDER — INSULIN DETEMIR 100 UNIT/ML ~~LOC~~ SOLN
50.0000 [IU] | Freq: Two times a day (BID) | SUBCUTANEOUS | Status: DC
  Administered 2020-09-23 – 2020-09-24 (×2): 50 [IU] via SUBCUTANEOUS
  Filled 2020-09-23 (×3): qty 0.5

## 2020-09-23 NOTE — Progress Notes (Signed)
Occupational Therapy Treatment Patient Details Name: Brittany Silva MRN: [id number] DOB: September 03, 1956 Today's Date: 09/23/2020    History of present illness Pt is a 64 y.o. female admitted 09/11/20 with 5 days of back pain, BLE pain, nausea/vomiting. MRI with possible epidural abscess s/p L4-5 facet aspiration 10/14. Pt also with acute osteomyelitis of L 4th toe s/p L 4th toe amputation 10/21. PMH includes retinopathy, OA, HTN, DM, anxiety.   OT comments  Pt continues to be motivated to participate with therapy, seen s/p L toe amputation with trial of flattened Darco shoe today. Pt reports improved comfort/ease of use with this darco shoe, but noted with decreased standing balance abilities today. Pt able to recognize and self correct, but poses fall risk. Pt overall min guard for simple transfers with RW, Min A for LB ADLs. Pt reports desire to return home, but recognizes difficulty in managing ADLs/IADLs at home. Pt with good recognition of deficits, open to rehab. Will monitor progress and update recommendations as appropriate.    Follow Up Recommendations  SNF;Supervision/Assistance - 24 hour;Other (comment) (vs HHOT pending pt progression)    Equipment Recommendations  3 in 1 bedside commode;Tub/shower bench;Other (comment) (RW)    Recommendations for Other Services      Precautions / Restrictions Precautions Precautions: Fall Precaution Comments: back pain Required Braces or Orthoses: Other Brace Other Brace: L darco shoe Restrictions Weight Bearing Restrictions: Yes LLE Weight Bearing: Partial weight bearing Other Position/Activity Restrictions: Per podiatry note 10/22: "...can be WBAT in surgical shoe. I did discuss with her that ultimately for the first 2-3 weeks I would like her to be PWB to heel if possible."       Mobility Bed Mobility Overal bed mobility: Modified Independent Bed Mobility: Supine to Sit     Supine to sit: Modified independent (Device/Increase  time);HOB elevated        Transfers Overall transfer level: Needs assistance Equipment used: Rolling walker (2 wheeled) Transfers: Sit to/from [facility] Sit to Stand: Min guard Stand pivot transfers: Min guard       General transfer comment: min guard with increased unsteadiness noted s/p amputation. Pt able to recognize LOB and self correct x 3    Balance Overall balance assessment: Needs assistance Sitting-balance support: No upper extremity supported;Feet supported Sitting balance-Leahy Scale: Good     Standing balance support: Bilateral upper extremity supported;During functional activity Standing balance-Leahy Scale: Poor Standing balance comment: reliant on UE support, unsteady without B UE support                           ADL either performed or assessed with clinical judgement   ADL Overall ADL's : Needs assistance/impaired     Grooming: Set up;Sitting;Wash/dry face;Applying deodorant   Upper Body Bathing: Set up;Sitting   Lower Body Bathing: Minimal assistance;Sit to/from stand Lower Body Bathing Details (indicate cue type and reason): Min A for maintaining balance in standing  Upper Body Dressing : Set up;Sitting   Lower Body Dressing: Minimal assistance;Sit to/from stand Lower Body Dressing Details (indicate cue type and reason): Min A overall for maintaining balance in standing, Min A to don L darco shoe Toilet Transfer: Min Geophysical data processor Details (indicate cue type and reason): min guard due to unsteadiness           General ADL Comments: Pt seen s/p L toe amputation 10/21 with darco shoe. Decreased balance, improving back pain but noted with increased pain after  trying elevated darco shoe yesterday     Vision   Vision Assessment?: No apparent visual deficits   Perception     Praxis      Cognition Arousal/Alertness: Awake/alert Behavior During Therapy: WFL for tasks  assessed/performed Overall Cognitive Status: Within Functional Limits for tasks assessed                                          Exercises     Shoulder Instructions       General Comments Trialed flattened darco shoe with pt reporting increased comfort/steadiness though still unsteady s/p toe amputation. Discussed HH vs SNF with pt reporting she has concerns about going home at this time though home is her goal.     Pertinent Vitals/ Pain       Pain Assessment: Faces Faces Pain Scale: Hurts little more Pain Location: L posterior thigh into lower back(?) with attempts at using darco shoe Pain Descriptors / Indicators: Grimacing;Discomfort;Spasm;Throbbing;Moaning Pain Intervention(s): Limited activity within patient's tolerance;Monitored during session;RN gave pain meds during session  Home Living                                          Prior Functioning/Environment              Frequency  Min 3X/week        Progress Toward Goals  OT Goals(current goals can now be found in the care plan section)  Progress towards OT goals: Progressing toward goals  Acute Rehab OT Goals Patient Stated Goal: I need to be able to take of myself and the house, get back to my dogs OT Goal Formulation: With patient Time For Goal Achievement: 09/29/20 Potential to Achieve Goals: Good ADL Goals Pt Will Perform Grooming: with modified independence;standing Pt Will Perform Lower Body Bathing: with min guard assist;sit to/from stand;with adaptive equipment;sitting/lateral leans Pt Will Perform Lower Body Dressing: with min guard assist;with adaptive equipment;sit to/from stand;sitting/lateral leans Pt Will Transfer to Toilet: with modified independence;bedside commode;regular height toilet;ambulating Pt Will Perform Toileting - Clothing Manipulation and hygiene: with modified independence;sit to/from stand;sitting/lateral leans  Plan Discharge plan needs to  be updated;Frequency remains appropriate    Co-evaluation                 AM-PAC OT "6 Clicks" Daily Activity     Outcome Measure   Help from another person eating meals?: None Help from another person taking care of personal grooming?: A Little Help from another person toileting, which includes using toliet, bedpan, or urinal?: A Little Help from another person bathing (including washing, rinsing, drying)?: A Little Help from another person to put on and taking off regular upper body clothing?: A Little Help from another person to put on and taking off regular lower body clothing?: A Little 6 Click Score: 19    End of Session Equipment Utilized During Treatment: Rolling walker  OT Visit Diagnosis: Unsteadiness on feet (R26.81);Other abnormalities of gait and mobility (R26.89);Muscle weakness (generalized) (M62.81);Pain Pain - Right/Left: Left Pain - part of body:  (L toe, R LE/back)   Activity Tolerance Patient tolerated treatment well   Patient Left with call bell/phone within reach;Other (comment) (on [facility] with NT attending)   Nurse Communication Mobility status;Other (comment) (RN present at start of session)  Time: 1657-9038 OT Time Calculation (min): 40 min  Charges: OT General Charges $OT Visit: 1 Visit OT Treatments $Self Care/Home Management : 23-37 mins $Therapeutic Activity: 8-22 mins  Layla Maw, OTR/L   Layla Maw 09/23/2020, 9:28 AM

## 2020-09-23 NOTE — Discharge Summary (Addendum)
Physician Discharge Summary  Brittany Silva AVW:[id number] DOB: November 05, 1956 DOA: 09/11/2020  PCP: Carol Ada, MD  Admit date: 09/11/2020   Discharge date: 09/24/2020  Admitted From:  Home.  Disposition:  Home with home services.  Recommendations for Outpatient Follow-up:  1. Follow up with PCP in 1-2 weeks. 2. Please obtain BMP/CBC in one week. 3. Advised to follow-up with infectious disease for follow-up.  Appointment has been made on Nov 22, 21. 4. Home health services been arranged for patient to get penicillin G IV through the PICC line. 5. Patient has epidural abscess, osteomyelitis getting antibiotics for 4 to 6 weeks. 6. Advised to follow up DR. PATEL Podiatry in one week.  Home Health: Yes PICC line, IV antibiotics. Equipment/Devices: PICC Line / IV antibiotics.  Discharge Condition: Stable CODE STATUS:Full code Diet recommendation: Heart Healthy    Brief [facility] course: This 64 year old female with history of chronic back pain, DM-2, HTN, hypothyroidism, morbid obesity and recent UTI presenting with 5 days hx. of lower back pain, bilateral leg pain, nausea, vomiting, fever and chills. MRI lumbar spine concerning for lumbar epidural abscess around the L5-S1 disc space and soft tissue enhancement on the right posterior to the L4-5 and L5-S1 facet joints. Neurosurgery consulted, and recommended transfer to [facility]. Concerned that a diabetic foot ulcer in her left fourth toe could be source of infection. She also has hardwares in her left foot. The next day, evaluated by neurosurgery who recommended IR biopsy for tissue diagnosis and ID consult for antibiotics. Patient underwent fluoroscopy guided needle aspiration. Tissue culture sent. Started on IV vancomycin, cefepime and Flagyl which was deescalated to vancomycin and ceftriaxone, then to IV penicillin G. 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  Podiatry consulted , Patient underwent left fourth toe amputation. Tolerated well, infected bone removed. Patient doesn't need antibiotics from foot Prespective. Patient has PICC line for prolonged IV antibiotics. ID recommended total 6 weeks of antibiotics  Penicillin G , last day would be Nov 23, 21. Home health services been arranged.  Patient is going to get penicillin G through the PICC line.  Advised to follow-up with infectious disease after completion of antibiotics.  She was managed for below problems.  Discharge Diagnoses:  Active Problems:   Type 2 diabetes mellitus with hyperglycemia (HCC)   Abscess in epidural space of lumbar spine   Diabetic foot ulcer (HCC)   Ankle pain   Epidural abscess   S/P amputation of foot, right (HCC)  Acute on chronic low back pain/radiculopathy sec. to L5-S1 epidural abscess/ L4-5 and L5-S1 facet joint infection.  - There are no signs of cauda equina syndrome. -Imagingand ABIas above. TTE without significant finding. CRP 23>>17. ESR 85>>77. Mild leukocytosis. -Neurosurgery consulted and recommended IR biopsy and ID consult for antibiotics. -Underwent fluoroscopy guided needle aspiration of the facet joints. -Initiated Vancomycin, cefepime and Flagyl on 10/14-10/15 -Continued Vancomycin and ceftriaxone 10/15>> 10/17 -Surgical tissue cultures grew few Streptococcus mitis/oralis sensitive to penicillin - Started IV penicillin G 10/17>>may need PICC line. ID to decide timing. -Pain control scheduled Tylenol and tramadol, and IV Dilaudid for breakthrough pain -Bowel regimen with as needed MiraLAX and Senokot-S -Repeat MRI left foot shows acute osteomyelitis of the distal phalanx of left fourth toe. -Podiatry consulted, underwent left fourth toe amputation tolerated well. -As per podiatry she does not need antibiotics from foot standpoint  of  view.   -ID recommended 6 weeks of penicillin G,  last day of antibiotic would be November 23. -PT/OT eval   DM-2 with hypo- and hyperglycemia: On Levemir 75 units twice daily and SSI. A1c 8.6%. -Continue SSI-high.  -Increased NovoLog AC from 12 to 15 units. -ContinueLevemir 40 units twice daily. -Continue atorvastatin.  Essential hypertension: -Continue home amlodipine. -Resume home losartan.  Hypothyroidism -Continue home Synthroid.  Hypokalemia-resolved.  Hyponatremia:  Na 132- 136 - 134 today. Recheck labs in am   Acute cystitis-treated with IV ceftriaxone x1 and Keflex x3 days -Should be covered with antibiotics as above.  Morbid obesity Body mass index is 44.64 kg/m   Discharge Instructions  Discharge Instructions    [facility] pharmacist to adjust dose for Vancomycin, Aminoglycosides and other anti-infective therapies as requested by physician.   Complete by: As directed    [facility] to provide Cath Flo 31m   Complete by: As directed    Administer for PICC line occlusion and as ordered by physician for other access device issues.   Anaphylaxis Kit: Provided to treat any anaphylactic reaction to the medication being provided to the patient if First Dose or when requested by physician   Complete by: As directed    Epinephrine 137mml vial / amp: Administer 0.35m24m0.35ml39mubcutaneously once for moderate to severe anaphylaxis, nurse to call physician and pharmacy when reaction occurs and call 911 if needed for immediate care   Diphenhydramine 50mg435mIV vial: Administer 25-50mg 40mM PRN for first dose reaction, rash, itching, mild reaction, nurse to call physician and pharmacy when reaction occurs   Sodium Chloride 0.9% NS 500ml I235mdminister if needed for hypovolemic blood pressure drop or as ordered by physician after call to physician with anaphylactic reaction   Call MD for:  difficulty breathing, headache or visual  disturbances   Complete by: As directed    Call MD for:  persistant dizziness or light-headedness   Complete by: As directed    Call MD for:  persistant nausea and vomiting   Complete by: As directed    Call MD for:  redness, tenderness, or signs of infection (pain, swelling, redness, odor or green/yellow discharge around incision site)   Complete by: As directed    Call MD for:  temperature >100.4   Complete by: As directed    Change dressing on IV access line weekly and PRN   Complete by: As directed    Diet - low sodium heart healthy   Complete by: As directed    Diet Carb Modified   Complete by: As directed    Discharge instructions   Complete by: As directed    Advised to follow-up with primary care physician in 1 week Advised to follow-up with infectious disease doctor for follow-up.  Appointment has been made. Home health services been arranged for patient to get penicillin G IV through the PICC line. Patient has epidural abscess, osteomyelitis getting antibiotics for 4 to 6 weeks.   Discharge wound care:   Complete by: As directed    Home health Nurse been arranged for dressing and iv Antibiotics.   Flush IV access with Sodium Chloride 0.9% and Heparin 10 units/ml or 100 units/ml   Complete by: As directed    Home infusion instructions - [facility]   Complete by: As directed    Instructions: Flush IV access with Sodium Chloride 0.9% and Heparin 10units/ml or 100units/ml   Change dressing on IV access line:  Weekly and PRN   Instructions Cath Flo 55m: Administer for PICC Line occlusion and as ordered by physician for other access device   [facility] pharmacist to adjust dose for: Vancomycin, Aminoglycosides and other anti-infective therapies as requested by physician   Increase activity slowly   Complete by: As directed    Method of administration may be changed at the discretion of home infusion pharmacist based upon assessment of the patient and/or  caregiver's ability to self-administer the medication ordered   Complete by: As directed      Allergies as of 09/24/2020      Reactions   Hydrochlorothiazide    rash   Lisinopril    Other reaction(s): Cough   Adhesive [tape]    Codeine Phosphate    REACTION: throat swelling   Darvon Other (See Comments)   Jittering, skin problems   Darvon [propoxyphene Hcl]    Hyperactivity; "makes me crazy"   Hydrocodone-acetaminophen Itching   *Has tolerated hydromorphone as an adult* Per pt, when she was a child had throat itching, difficulty breathing and feel strange and also vomit   Propoxyphene Hcl    REACTION: Makes her crazy   Latex Rash   Neosporin [neomycin-bacitracin Zn-polymyx] Rash   Sulfamethoxazole Itching, Rash      Medication List    STOP taking these medications   cephALEXin 500 MG capsule Commonly known as: KEFLEX     TAKE these medications   acetaminophen 500 MG tablet Commonly known as: TYLENOL Take 500-2,000 mg by mouth as needed for moderate pain.   amLODipine 5 MG tablet Commonly known as: NORVASC Take 1 tablet (5 mg total) by mouth daily.   atorvastatin 40 MG tablet Commonly known as: LIPITOR Take 40 mg by mouth daily.   DULoxetine 30 MG capsule Commonly known as: CYMBALTA Take 90 mg by mouth daily.   furosemide 20 MG tablet Commonly known as: LASIX Take 40 mg by mouth daily.   HumaLOG KwikPen 100 UNIT/ML KwikPen Generic drug: insulin lispro Inject 15-25 Units into the skin in the morning, at noon, and at bedtime.   Levemir 100 UNIT/ML injection Generic drug: insulin detemir Inject 75 Units into the skin in the morning and at bedtime.   losartan 100 MG tablet Commonly known as: COZAAR   meloxicam 15 MG tablet Commonly known as: MOBIC Take 15 mg by mouth daily.   ondansetron 4 MG disintegrating tablet Commonly known as: Zofran ODT Take 1 tablet (4 mg total) by mouth every 8 (eight) hours as needed for nausea or vomiting.   oxyCODONE 5  MG immediate release tablet Commonly known as: Roxicodone Take 1 tablet (5 mg total) by mouth every 8 (eight) hours as needed.   penicillin G  IVPB Inject 24 Million Units into the vein over 24 hr. As continuous infusion   Indication:  osteomyelitis First Dose: No Last Day of Therapy:  10/24/20 Labs - Once weekly:  CBC/D and BMP, Labs - Every other week:  ESR and CRP Method of administration: Elastomeric (Continuous infusion) Method of administration may be changed at the discretion of home infusion pharmacist based upon assessment of the patient and/or caregiver's ability to self-administer the medication ordered.   potassium chloride 8 MEQ tablet Commonly known as: KLOR-CON Take 8 mEq by mouth 2 (two) times daily as needed (low potassium).   Synthroid 75 MCG tablet Generic drug: levothyroxine Take 75 mcg by mouth every morning.   traMADol 50 MG tablet Commonly known as: ULTRAM Take 1 tablet (  50 mg total) by mouth every 8 (eight) hours.            Discharge Care Instructions  (From admission, onward)         Start     Ordered   09/23/20 0000  Change dressing on IV access line weekly and PRN  (Home infusion instructions - [facility] )        09/23/20 1113   09/23/20 0000  Discharge wound care:       Comments: Home health Nurse been arranged for dressing and iv Antibiotics.   09/23/20 1122          Follow-up Information    Dawley, Troy C, DO Follow up in 4 week(s).   Why: call for appointment Contact information: [address] [place] [address] [phone]        Carol Ada, MD Follow up in 1 week(s).   Specialty: Family Medicine Contact information: [address], [place] [address] Bruin, Lavell Islam, MD Follow up on 10/23/2020.   Specialty: Infectious Diseases Contact information: [address] [address] [phone]        Felipa Furnace, DPM  Follow up in 1 week(s).   Specialty: Podiatry Contact information: [place] [address] [phone]              Allergies  Allergen Reactions  . Hydrochlorothiazide     rash  . Lisinopril     Other reaction(s): Cough  . Adhesive [Tape]   . Codeine Phosphate     REACTION: throat swelling  . Darvon Other (See Comments)    Jittering, skin problems  . Darvon [Propoxyphene Hcl]     Hyperactivity; "makes me crazy"  . Hydrocodone-Acetaminophen Itching    *Has tolerated hydromorphone as an adult* Per pt, when she was a child had throat itching, difficulty breathing and feel strange and also vomit   . Propoxyphene Hcl     REACTION: Makes her crazy  . Latex Rash  . Neosporin [Neomycin-Bacitracin Zn-Polymyx] Rash  . Sulfamethoxazole Itching and Rash    Consultations:  Infectious diseases,   Podiatry   Procedures/Studies: DG Ankle 2 Views Left  Result Date: 09/15/2020 CLINICAL DATA:  Chronic left ankle pain. EXAM: LEFT ANKLE - 2 VIEW COMPARISON:  Left foot x-rays dated Apr 05, 2014. FINDINGS: No acute fracture or dislocation. Chronic fracture of the medial malleolar pin. Abandoned screw in the lateral malleolus. Severe tibiotalar osteoarthritis with joint space narrowing and marked bony hypertrophy. Mild dorsal degenerative spurring of the second TMT joint. Bone mineralization is normal. Soft tissues are unremarkable. IMPRESSION: 1. Severe post-traumatic tibiotalar osteoarthritis. Electronically Signed   By: Titus Dubin M.D.   On: 09/15/2020 12:45   CT Lumbar Spine Wo Contrast  Result Date: 09/12/2020 CLINICAL DATA:  Low back pain. Suspect infection. Pain radiating down leg. EXAM: CT LUMBAR SPINE WITHOUT CONTRAST TECHNIQUE: Multidetector CT imaging of the lumbar spine was performed without intravenous contrast administration. Multiplanar CT image reconstructions were also generated. COMPARISON:  MRI lumbar spine without contrast 09/12/2020 FINDINGS:  Segmentation: Normal Alignment: Slight retrolisthesis L4-5. Mild levoscoliosis. Vertebrae: Negative for fracture or mass. No bony erosion of the endplates. Paraspinal and other soft tissues: Negative for paraspinous mass, adenopathy, or fluid collection. Psoas muscle symmetric in size bilaterally. Atherosclerotic disease without aortic aneurysm. Disc levels: T12-L1: Disc degeneration with Schmorl's node and left paracentral disc  protrusion which is partially calcified. Mild spinal stenosis on the left. L1-2: Diffuse disc bulging and bilateral facet degeneration. No significant spinal or foraminal stenosis L2-3: Advanced disc degeneration asymmetric to the right. Disc space narrowing and spurring asymmetric to the right. Moderate to severe subarticular and foraminal stenosis on the right due to spurring. Mild to moderate spinal stenosis. Left foramen patent. L3-4: Disc degeneration with disc bulging. Bilateral facet degeneration. Mild spinal stenosis. Moderate right subarticular stenosis. L4-5: Diffuse bulging of the disc. Bilateral facet hypertrophy. Mild spinal stenosis. Moderate subarticular stenosis bilaterally. No bony erosive changes in the facet joints. Soft tissue fluid collection posterior and inferior to the right L4-5 facet joint seen on MRI not visualized by CT. L5-S1: Normal disc space. Bilateral facet degeneration right greater than left with bony hypertrophy of the facet joints. No bony erosive changes. Moderate subarticular stenosis bilaterally. Epidural fluid collection at this level seen on MRI not visualized by CT. IMPRESSION: Multilevel degenerative change throughout the lumbar spine as above MRI findings suspicious for infection. These are best seen by MRI not well visualized by CT. Recommend follow-up MRI lumbar spine with contrast to evaluate for epidural abscess and abnormal enhancement. Electronically Signed   By: Franchot Gallo M.D.   On: 09/12/2020 11:44   MR LUMBAR SPINE WO  CONTRAST  Result Date: 09/12/2020 CLINICAL DATA:  Low back pain. Right sacroiliac pain radiating to the right leg for 4 days. Inability to void. EXAM: MRI LUMBAR SPINE WITHOUT CONTRAST TECHNIQUE: Multiplanar, multisequence MR imaging of the lumbar spine was performed. No intravenous contrast was administered. COMPARISON:  None. FINDINGS: Segmentation: Normal lumbar segmentation is assumed with the lowest fully formed disc space designated L5-S1. Alignment: Mild lumbar levoscoliosis. Trace retrolisthesis of L2 on L3, L3 on L4, and L4 on L5. Vertebrae: There is mild chronic anterior wedging of the L2 vertebral body. There is no evidence of discitis. There is right facet joint fluid at L4-5 without significant marrow edema or bone destruction. A fluid collection posterior and inferior to the right L4-5 facet joint measures 1.1 x 1.1 x 1.8 cm. There is abnormal T2 hyperintense material in the ventral and dorsal epidural space at L5-S1 measuring up to 5 mm in thickness and resulting in severe spinal stenosis. Conus medullaris and cauda equina: Conus extends to the T12-L1 level. Conus and cauda equina appear normal. Paraspinal and other soft tissues: Edema in the right-sided posterior paraspinal musculature in the lower lumbar spine. Disc levels: T12-L1: Disc desiccation. Small left paracentral disc extrusion with slight superior migration. No significant stenosis. L1-2: Tiny central disc protrusion, mild left eccentric disc bulging, and mild facet arthrosis without stenosis. L2-3: Disc desiccation and severe disc space narrowing. Right eccentric disc bulging, endplate spurring, disc space height loss, and moderate facet and ligamentum flavum hypertrophy result in moderate spinal stenosis, moderate to severe bilateral lateral recess stenosis, and severe right neural foraminal stenosis. Potential right L2 and bilateral L3 nerve root impingement. L3-4: Disc desiccation. Disc bulging and severe facet and ligamentum flavum  hypertrophy result in mild-to-moderate spinal stenosis, moderate bilateral lateral recess stenosis, and mild bilateral neural foraminal stenosis. L4-5: Disc desiccation. Disc bulging and severe facet and ligamentum flavum hypertrophy result in moderate spinal stenosis, moderate left greater than right lateral recess stenosis, and moderate left greater than right neural foraminal stenosis. L5-S1: Normal disc. Bilateral facet arthrosis. Severe spinal stenosis due to the above described epidural process. Patent neural foramina. IMPRESSION: 1. Abnormal epidural process at L5-S1 most consistent with infectious phlegmon  or abscess resulting in severe spinal stenosis. 2. Right-sided facet joint fluid at L4-5 without significant marrow edema or bone destruction, indeterminate for septic facet arthritis although there is a small fluid collection posterior and inferior to the facet joint which could reflect an abscess. 3. Right-sided posterior paraspinal muscle edema which may reflect myositis. 4. Advanced disc degeneration at L2-3 with moderate spinal stenosis and moderate to severe lateral recess stenosis. 5. Moderate lateral recess stenosis at L3-4 and L4-5. Electronically Signed   By: Logan Bores M.D.   On: 09/12/2020 09:26   MR LUMBAR SPINE W CONTRAST  Result Date: 09/12/2020 CLINICAL DATA:  Numbness and tingling, paresthesia. Concern for spinal infection on unenhanced MRI. Diabetes. Leukocytosis. EXAM: MRI LUMBAR SPINE WITH CONTRAST CONTRAST:  51m GADAVIST GADOBUTROL 1 MMOL/ML IV SOLN COMPARISON:  MRI lumbar spine without contrast 09/12/2020. CT lumbar spine 09/12/2020 FINDINGS: Postcontrast imaging confirms posterior epidural fluid collections bilaterally at the L5-S1 and S1 level. These show peripheral enhancement and are suspicious for epidural abscess. Additional ventral epidural fluid collection anterior to the thecal sac at the L5 level also is suspicious for abscess. This exerts mass-effect on the thecal  sac with moderate spinal stenosis. There is asymmetric abnormal enhancement in the posterior paraspinous muscles on the right at L4-5 and L5-S1 suggestive of infection. There is a rim enhancing fluid collection posterior to the L5-S1 facet joint which could represent an abscess. This could be arising from the L4-5 or L5-S1 facet joint. There is degenerative change in the facet joints bilaterally at L4-5 and L5-S1. No evidence of discitis. Multilevel degenerative change throughout the lumbar spine as described on the prior study. There is moderate spinal stenosis L4-5 and moderate to severe spinal stenosis at L5-S1. IMPRESSION: Findings are very suspicious for lumbar spinal infection on the right. There are ventral and posterior fluid collections around the L5-S1 disc space compatible with epidural abscess. There is soft tissue enhancement on the right posterior to the L4-5 and L5-S1 facet joints which may be infected. There is a 1 cm fluid collection posterior to the facet joints on the right which could be an abscess. Electronically Signed   By: CFranchot GalloM.D.   On: 09/12/2020 12:20   MR FOOT LEFT W WO CONTRAST  Result Date: 09/19/2020 CLINICAL DATA:  Diabetic foot ulcer EXAM: MRI OF THE LEFT FOREFOOT WITHOUT AND WITH CONTRAST TECHNIQUE: Multiplanar, multisequence MR imaging of the left forefoot was performed both before and after administration of intravenous contrast. CONTRAST:  126mGADAVIST GADOBUTROL 1 MMOL/ML IV SOLN COMPARISON:  X-ray 09/08/2020 FINDINGS: Technical note: Motion degraded examination. Bones/Joint/Cartilage Bone marrow edema throughout the distal phalanx of the left fourth toe with associated confluent low T1 bone marrow signal (series 9, images 11-13). Marrow edema extends to the level of the proximal interphalangeal joint. The distal interphalangeal joint appears congenitally fused. No marrow edema within the fourth toe proximal phalanx. There are fractures involving the subchondral  aspect of the second and third metatarsal heads with surrounding marrow edema (series 7, image 14). 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 Elsewhere, included osseous structures of the forefoot are intact. Ligaments Intact Lisfranc ligament. No evidence of collateral ligament disruption. Muscles and Tendons Diffuse atrophy and fatty infiltration of the intrinsic foot musculature suggesting chronic denervation changes. Soft tissues Soft tissue swelling involving the distal aspect of the fourth toe. Suspect small plantar skin ulceration with underlying fluid collection measuring 10 x 7 x 9  mm adjacent to the fourth toe distal phalanx. IMPRESSION: 1. Acute osteomyelitis of the distal phalanx of the left fourth toe. 2. Small plantar skin ulceration with underlying fluid collection measuring 10 x 7 x 9 mm adjacent to the fourth toe distal phalanx. 3. Nondisplaced fractures involving the subchondral aspects of the second and third metatarsal heads with surrounding marrow edema, likely acute to subacute. Findings could be posttraumatic or secondary to osteonecrosis. 4. 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 Electronically Signed   By: Davina Poke D.O.   On: 09/19/2020 15:24   MR FOOT LEFT W WO CONTRAST  Result Date: 09/16/2020 CLINICAL DATA:  Diabetes, foot swelling, possible osteomyelitis. EXAM: MRI OF THE LEFT FOREFOOT WITHOUT AND WITH CONTRAST TECHNIQUE: Multiplanar, multisequence MR imaging of the ankle was performed before and after the administration of intravenous contrast. CONTRAST:  40m GADAVIST GADOBUTROL 1 MMOL/ML IV SOLN COMPARISON:  Multiple exams, including radiographs from 09/15/2020 FINDINGS: TENDONS Peroneal: Longitudinal split tear of the peroneus brevis adjacent to the lateral malleolus. Notable peroneus longus tendinopathy adjacent  to the distal calcaneus. Posteromedial: Partial obscuration of the distal tibialis posterior tendon due to metal artifact from the fractured K-in the medial malleolus. Distal tibialis posterior tendinopathy. Anterior: Mild thickening and tendinopathy of the anterior tendons as they pass over the distal tibial rim fragment. Achilles: Mild distal Achilles tendinopathy. Plantar Fascia: Thickened medial band of the plantar fascia suggesting plantar fasciitis. LIGAMENTS Lateral: Nonvisualization of the lateral ligamentous complex due to metal artifact and severe deformity due to bony fragmentation and irregularity. Medial: Nonvisualization of the deltoid ligament components and spring ligament due to metal artifact and regional bony deformities. CARTILAGE Ankle Joint: Chronically fractured and flattened distal tibia with chronically fractured and flattened talus favoring Charcot arthropathy. Severe irregularity of cortical margins along the tibiotalar articulation with some accentuated T2 signal in this articulation. Chronic anterior tibial fragment measures 1.9 by 2.6 by 5.3 cm. Subtalar Joints/Sinus Tarsi: degenerative arthropathy in the subtalar joints with some mild sparing of the posterior facet. Bones: Severe tibiotalar arthropathy with chronic fractures and possible Charcot joint as noted above. Marrow edema in the distal tibia, talus, anterior calcaneus, and the navicular, the with an effusion of the talonavicular and calcaneal navicular articulations. The degree of edema and enhancement is proportionate to the severe arthropathy and does not necessarily implicate osteomyelitis. I do not see any draining sinus tract but if the patient does have such a tract based on physical inspection in the possibility of infection might be heightened. There is a fractured K-wire the medial malleolus. There is a fragment from a prior screw in the lateral malleolus. Dorsal midfoot spurring. There is degenerative arthropathy at  the articulation between the lateral cuneiform and the third metatarsal dorsally as shown on image 22 of series 12. Other: Atrophic plantar musculature in the foot. Atrophic stents are digitorum brevis muscle. IMPRESSION: IMPRESSION 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 No compelling findings of osteomyelitis although there can sometimes be some overlap in the edema, enhancement, and fragmentation from Charcot arthropathy and infection; if the patient has other clinical signs of infection such as a draining sinus tract then the possibility of superimposed infection might be heightened. No drainable abscess identified. 2. There is a chronically fractured K-wire the medial malleolus. Metal screw fragment in the lateral malleolus. 3. Nonvisualization of the lateral ligamentous complex, deltoid ligament components, and spring ligament due to metal artifact and regional  bony deformities. 4. Longitudinal split tear of the peroneus brevis adjacent to the lateral malleolus. Notable peroneus longus tendinopathy adjacent to the distal calcaneus. 5. Mild distal Achilles tendinopathy. 6. Thickened medial band of the plantar fascia suggesting plantar fasciitis. 7. Atrophic plantar musculature in the foot. Electronically Signed   By: Van Clines M.D.   On: 09/16/2020 11:46   DG Foot Complete Left  Result Date: 09/21/2020 CLINICAL DATA:  Status post fourth toe amputation. EXAM: LEFT FOOT - COMPLETE 3+ VIEW COMPARISON:  None. FINDINGS: Postsurgical changes are seen involving the distal tibia and fibula. Severe degenerative changes seen involving the talotibial joint. Status post amputation of the fourth toe at the level of the proximal base of the fourth proximal phalanx. No other fracture or dislocation is noted. IMPRESSION: Status post amputation of the fourth toe at the level of the proximal base of the fourth proximal phalanx. 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  Electronically Signed   By: Marijo Conception M.D.   On: 09/21/2020 13:12   DG Toe 4th Left  Result Date: 09/08/2020 CLINICAL DATA:  Wound to fourth toe EXAM: LEFT FOURTH TOE COMPARISON:  05/12/2014 FINDINGS: No fracture or malalignment. No soft tissue emphysema. No gross bony destructive change or periostitis. IMPRESSION: No acute osseous abnormality. Electronically Signed   By: Donavan Foil M.D.   On: 09/08/2020 22:10   VAS [place] ABI WITH/WO TBI  Result Date: 09/18/2020 LOWER EXTREMITY DOPPLER STUDY Indications: Ulceration. High Risk Factors: Hypertension, Diabetes.  Limitations: Today's exam was limited due to irregular heart rhythm. Performing Technologist: Sharion Dove RVS  Examination Guidelines: A complete evaluation includes at minimum, Doppler waveform signals and systolic blood pressure reading at the level of bilateral brachial, anterior tibial, and posterior tibial arteries, when vessel segments are accessible. Bilateral testing is considered an integral part of a complete examination. Photoelectric Plethysmograph (PPG) waveforms and toe systolic pressure readings are included as required and additional duplex testing as needed. Limited examinations for reoccurring indications may be performed as noted.  ABI Findings: +---------+------------------+-----+-----------+--------+ Right    Rt Pressure (mmHg)IndexWaveform   Comment  +---------+------------------+-----+-----------+--------+ Brachial 178                    multiphasic         +---------+------------------+-----+-----------+--------+ PTA      182               1.02 multiphasic         +---------+------------------+-----+-----------+--------+ DP       178               1.00 multiphasic         +---------+------------------+-----+-----------+--------+ Great Toe104               0.58                     +---------+------------------+-----+-----------+--------+  +---------+------------------+-----+-----------+-------+ Left     Lt Pressure (mmHg)IndexWaveform   Comment +---------+------------------+-----+-----------+-------+ Brachial 178                    multiphasic        +---------+------------------+-----+-----------+-------+ PTA      160               0.90 multiphasic        +---------+------------------+-----+-----------+-------+ DP       163               0.92 multiphasic        +---------+------------------+-----+-----------+-------+  Great Toe89                0.50                    +---------+------------------+-----+-----------+-------+ +-------+-----------+-----------+------------+------------+ ABI/TBIToday's ABIToday's TBIPrevious ABIPrevious TBI +-------+-----------+-----------+------------+------------+ Right  1.02       0.58                                +-------+-----------+-----------+------------+------------+ Left   0.92       0.50                                +-------+-----------+-----------+------------+------------+  Summary: Right: Resting right ankle-brachial index is within normal range. No evidence of significant right lower extremity arterial disease. The right toe-brachial index is abnormal. Left: Resting left ankle-brachial index is within normal range. No evidence of significant left lower extremity arterial disease. The left toe-brachial index is abnormal.  *See table(s) above for measurements and observations.  Electronically signed by Ruta Hinds MD on 09/18/2020 at 5:41:59 PM.   Final    ECHOCARDIOGRAM COMPLETE  Result Date: 09/15/2020    ECHOCARDIOGRAM REPORT   Patient Name:   Brittany Silva Date of Exam: 09/15/2020 Medical Rec #:  [id number]        Height:       67.0 in Accession #:    [id number]       Weight:       285.0 lb Date of Birth:  1955-12-26        BSA:          2.351 m Patient Age:    15 years         BP:           141/75 mmHg Patient Gender: F                HR:            80 bpm. Exam Location:  Inpatient Procedure: 2D Echo Indications:    Fever 780.6 / R50.9  History:        Patient has no prior history of Echocardiogram examinations.                 Risk Factors:Diabetes and Hypertension. History of chronic back                 pain, thyroid disease. Presenting with back pain, bilateral leg                 pain, nausea, vomiting, fever and chills. Acute cystitis.                 Epidural abscess.  Sonographer:    [name] RDCS Referring Phys: [place]  1. Left ventricular ejection fraction, by estimation, is 60 to 65%. The left ventricle has normal function. The left ventricle has no regional wall motion abnormalities. There is mild left ventricular hypertrophy. Left ventricular diastolic parameters were normal.  2. Right ventricular systolic function is normal. The right ventricular size is normal. Tricuspid regurgitation signal is inadequate for assessing PA pressure.  3. The mitral valve is normal in structure. Trivial mitral valve regurgitation. No evidence of mitral stenosis.  4. The aortic valve is grossly normal. Unable to determine aortic valve morphology due to image quality. Aortic valve regurgitation is not visualized. No aortic  stenosis is present. Conclusion(s)/Recommendation(s): No evidence of valvular vegetations on this transthoracic echocardiogram. Would recommend a transesophageal echocardiogram to exclude infective endocarditis if clinically indicated. FINDINGS  Left Ventricle: Left ventricular ejection fraction, by estimation, is 60 to 65%. The left ventricle has normal function. The left ventricle has no regional wall motion abnormalities. The left ventricular internal cavity size was normal in size. There is  mild left ventricular hypertrophy. Left ventricular diastolic parameters were normal. Right Ventricle: The right ventricular size is normal. No increase in right ventricular wall thickness. Right ventricular systolic  function is normal. Tricuspid regurgitation signal is inadequate for assessing PA pressure. Left Atrium: Left atrial size was normal in size. Right Atrium: Right atrial size was normal in size. Pericardium: There is no evidence of pericardial effusion. Mitral Valve: The mitral valve is normal in structure. Mild mitral annular calcification. Trivial mitral valve regurgitation. No evidence of mitral valve stenosis. Tricuspid Valve: The tricuspid valve is normal in structure. Tricuspid valve regurgitation is trivial. No evidence of tricuspid stenosis. Aortic Valve: The aortic valve is grossly normal. Aortic valve regurgitation is not visualized. No aortic stenosis is present. Pulmonic Valve: The pulmonic valve was normal in structure. Pulmonic valve regurgitation is trivial. No evidence of pulmonic stenosis. Aorta: The aortic root is normal in size and structure. Venous: The inferior vena cava was not well visualized. IAS/Shunts: The interatrial septum was not well visualized.  LEFT VENTRICLE PLAX 2D LVIDd:         4.30 cm  Diastology LVIDs:         3.20 cm  LV e' medial:    5.68 cm/s LV PW:         1.10 cm  LV E/e' medial:  15.8 LV IVS:        1.30 cm  LV e' lateral:   7.22 cm/s LVOT diam:     2.10 cm  LV E/e' lateral: 12.4 LV SV:         61 LV SV Index:   26 LVOT Area:     3.46 cm  RIGHT VENTRICLE RV S prime:     13.10 cm/s TAPSE (M-mode): 2.4 cm LEFT ATRIUM             Index       RIGHT ATRIUM           Index LA diam:        4.40 cm 1.87 cm/m  RA Area:     13.70 cm LA Vol (A2C):   34.5 ml 14.68 ml/m RA Volume:   30.00 ml  12.76 ml/m LA Vol (A4C):   44.0 ml 18.72 ml/m LA Biplane Vol: 39.3 ml 16.72 ml/m  AORTIC VALVE LVOT Vmax:   85.50 cm/s LVOT Vmean:  57.400 cm/s LVOT VTI:    0.177 m  AORTA Ao Root diam: 3.00 cm MITRAL VALVE MV Area (PHT): 4.60 cm    SHUNTS MV Decel Time: 165 msec    Systemic VTI:  0.18 m MV E velocity: 89.50 cm/s  Systemic Diam: 2.10 cm MV A velocity: 77.60 cm/s MV E/A ratio:  1.15 Cherlynn Kaiser MD Electronically signed by Cherlynn Kaiser MD Signature Date/Time: 09/15/2020/3:50:04 PM    Final    [facility] PLACEMENT LEFT >5 YRS INC IMG GUIDE  Result Date: 09/20/2020 INDICATION: Patient history of poor venous access, need for long-term IV antibiotic therapy. Request to IR for PICC placement. EXAM: RIGHT UPPER EXTREMITY PICC LINE PLACEMENT WITH ULTRASOUND AND FLUOROSCOPIC GUIDANCE MEDICATIONS: 4 mL 1%  lidocaine ANESTHESIA/SEDATION: None. FLUOROSCOPY TIME:  Fluoroscopy Time: 2 minutes 48 seconds (1 mGy). COMPLICATIONS: None immediate. PROCEDURE: The patient was advised of the possible risks and complications and agreed to undergo the procedure. The patient was then brought to the angiographic suite for the procedure. The right arm was prepped with chlorhexidine, draped in the usual sterile fashion using maximum barrier technique (cap and mask, sterile gown, sterile gloves, large sterile sheet, hand hygiene and cutaneous antisepsis) and infiltrated locally with 1% Lidocaine. Ultrasound demonstrated patency of the right basilic vein, and this was documented with an image. Under real-time ultrasound guidance, this vein was accessed with a 21 gauge micropuncture needle and image documentation was performed. A 0.018 wire was introduced in to the vein. Over this, a 5 [place] double lumen power injectable PICC was advanced to the lower SVC/right atrial junction. Fluoroscopy during the procedure and fluoro spot radiograph confirms appropriate catheter position. The catheter was flushed, aspirated, capped and covered with a sterile dressing. Catheter length: 36 cm IMPRESSION: Successful right arm power PICC line placement with ultrasound and fluoroscopic guidance. The catheter is ready for use. Read by Candiss Norse, PA-C Electronically Signed   By: Ruthann Cancer MD   On: 09/20/2020 14:01   [place] EKG SITE RITE  Result Date: 09/19/2020 If Site Rite image not attached, placement could not be confirmed due  to current cardiac rhythm.  Amputation of left fourth toe.   Subjective: Patient is seen and examined at bedside.  Overnight events noted.  Patient reports feeling better, states pain is controlled, she wants to go home , [facility] services been arranged for patient to get penicillin G through PICC line.  Discharge Exam: Vitals:   09/24/20 0352 09/24/20 0900  BP: (!) 142/71 (!) 146/68  Pulse: 85 87  Resp: 20 18  Temp: 98.1 F (36.7 C) 98.4 F (36.9 C)  SpO2: 95% 94%   Vitals:   09/23/20 1424 09/23/20 2002 09/24/20 0352 09/24/20 0900  BP: 133/63 131/72 (!) 142/71 (!) 146/68  Pulse: 84 84 85 87  Resp: _0 Temp: 97.8 F (36.6 C) 98 F (36.7 C) 98.1 F (36.7 C) 98.4 F (36.9 C)  TempSrc: Oral Oral Oral Oral  SpO2: 98% 95% 95% 94%  Weight:      Height:        General: Pt is alert, awake, not in acute distress Cardiovascular: RRR, S1/S2 +, no rubs, no gallops Respiratory: CTA bilaterally, no wheezing, no rhonchi Abdominal: Soft, NT, ND, bowel sounds + Extremities: no edema, no cyanosis    The results of significant diagnostics from this hospitalization (including imaging, microbiology, ancillary and laboratory) are listed below for reference.     Microbiology: Recent Results (from the past 240 hour(s))  Aerobic/Anaerobic Culture (surgical/deep wound)     Status: None   Collection Time: 09/14/20  1:52 PM   Specimen: Abscess; Synovial Fluid  Result Value Ref Range Status   Specimen Description ABSCESS  Final   Special Requests RIGHT L4 L5 JOINT  Final   Gram Stain   Final    ABUNDANT WBC PRESENT, PREDOMINANTLY PMN MODERATE GRAM POSITIVE COCCI IN PAIRS IN CHAINS    Culture   Final    FEW STREPTOCOCCUS MITIS/ORALIS NO ANAEROBES ISOLATED Performed at [facility], [address], [place], [place] [address]    Report Status 09/19/2020 FINAL  Final   Organism ID, Bacteria STREPTOCOCCUS MITIS/ORALIS  Final      Susceptibility   Streptococcus mitis/oralis -  MIC*    TETRACYCLINE 0.5 SENSITIVE Sensitive     VANCOMYCIN 0.5 SENSITIVE Sensitive     CLINDAMYCIN 0.5 INTERMEDIATE Intermediate     PENICILLIN Value in next row Sensitive      SENSITIVE0.06    CEFTRIAXONE Value in next row Sensitive      SENSITIVE0.12    * FEW STREPTOCOCCUS MITIS/ORALIS  Surgical pcr screen     Status: None   Collection Time: 09/20/20  5:42 PM   Specimen: Nasal Mucosa; Nasal Swab  Result Value Ref Range Status   MRSA, PCR NEGATIVE NEGATIVE Final   Staphylococcus aureus NEGATIVE NEGATIVE Final    Comment: (NOTE) The Xpert SA Assay (FDA approved for NASAL specimens in patients 33 years of age and older), is one component of a comprehensive surveillance program. It is not intended to diagnose infection nor to guide or monitor treatment. Performed at [facility], [place] [address], [place], [place] [address]      Labs: BNP (last 3 results) No results for input(s): BNP in the last 8760 hours. Basic Metabolic Panel: Recent Labs  Lab 09/18/20 0359 09/19/20 0253 09/20/20 0210 09/21/20 0418 09/22/20 0405  NA 132*  --  136 130* 134*  K 4.1  --  3.8 3.9 4.8  CL 97*  --  98 95* 96*  CO2 24  --  _0 GLUCOSE 353*  --  109* 187* 266*  BUN 12  --  _1 CREATININE 0.63 0.59 0.59 0.66 0.83  CALCIUM 8.4*  --  8.8* 8.7* 9.0  MG 2.0  --  1.9 1.8  --   PHOS 2.9  --  3.6 2.9  --    Liver Function Tests: Recent Labs  Lab 09/18/20 0359 09/20/20 0210  ALBUMIN 2.3* 2.3*   No results for input(s): LIPASE, AMYLASE in the last 168 hours. No results for input(s): AMMONIA in the last 168 hours. CBC: Recent Labs  Lab 09/18/20 0359 09/20/20 0210 09/21/20 0418  WBC 11.3* 8.4 8.6  NEUTROABS  --  6.3  --   HGB 12.1 11.5* 11.6*  HCT 36.5 34.9* 34.7*  MCV 87.7 87.7 87.6  PLT 425* 424* 397   Cardiac Enzymes: No results for input(s): CKTOTAL, CKMB, CKMBINDEX, TROPONINI in the last 168 hours. BNP: Invalid input(s): POCBNP CBG: Recent Labs  Lab  09/23/20 0642 09/23/20 1135 09/23/20 1710 09/23/20 2043 09/24/20 0634  GLUCAP 224* 251* 143* 191* 226*   D-Dimer No results for input(s): DDIMER in the last 72 hours. Hgb A1c No results for input(s): HGBA1C in the last 72 hours. Lipid Profile No results for input(s): CHOL, HDL, LDLCALC, TRIG, CHOLHDL, LDLDIRECT in the last 72 hours. Thyroid function studies No results for input(s): TSH, T4TOTAL, T3FREE, THYROIDAB in the last 72 hours.  Invalid input(s): FREET3 Anemia work up No results for input(s): VITAMINB12, FOLATE, FERRITIN, TIBC, IRON, RETICCTPCT in the last 72 hours. Urinalysis    Component Value Date/Time   COLORURINE YELLOW 09/12/2020 0528   APPEARANCEUR CLEAR 09/12/2020 0528   LABSPEC 1.022 09/12/2020 0528   PHURINE 5.0 09/12/2020 0528   GLUCOSEU >=500 (A) 09/12/2020 0528   HGBUR NEGATIVE 09/12/2020 0528   HGBUR trace-lysed 05/16/2008 0849   BILIRUBINUR NEGATIVE 09/12/2020 0528   KETONESUR NEGATIVE 09/12/2020 0528   PROTEINUR NEGATIVE 09/12/2020 0528   UROBILINOGEN 0.2 05/16/2008 0849   NITRITE NEGATIVE 09/12/2020 0528   LEUKOCYTESUR NEGATIVE 09/12/2020 0528   Sepsis Labs Invalid input(s): PROCALCITONIN,  WBC,  LACTICIDVEN Microbiology Recent  Results (from the past 240 hour(s))  Aerobic/Anaerobic Culture (surgical/deep wound)     Status: None   Collection Time: 09/14/20  1:52 PM   Specimen: Abscess; Synovial Fluid  Result Value Ref Range Status   Specimen Description ABSCESS  Final   Special Requests RIGHT L4 L5 JOINT  Final   Gram Stain   Final    ABUNDANT WBC PRESENT, PREDOMINANTLY PMN MODERATE GRAM POSITIVE COCCI IN PAIRS IN CHAINS    Culture   Final    FEW STREPTOCOCCUS MITIS/ORALIS NO ANAEROBES ISOLATED Performed at [facility], [address], [place], [place] [address]    Report Status 09/19/2020 FINAL  Final   Organism ID, Bacteria STREPTOCOCCUS MITIS/ORALIS  Final      Susceptibility   Streptococcus mitis/oralis - MIC*     TETRACYCLINE 0.5 SENSITIVE Sensitive     VANCOMYCIN 0.5 SENSITIVE Sensitive     CLINDAMYCIN 0.5 INTERMEDIATE Intermediate     PENICILLIN Value in next row Sensitive      SENSITIVE0.06    CEFTRIAXONE Value in next row Sensitive      SENSITIVE0.12    * FEW STREPTOCOCCUS MITIS/ORALIS  Surgical pcr screen     Status: None   Collection Time: 09/20/20  5:42 PM   Specimen: Nasal Mucosa; Nasal Swab  Result Value Ref Range Status   MRSA, PCR NEGATIVE NEGATIVE Final   Staphylococcus aureus NEGATIVE NEGATIVE Final    Comment: (NOTE) The Xpert SA Assay (FDA approved for NASAL specimens in patients 43 years of age and older), is one component of a comprehensive surveillance program. It is not intended to diagnose infection nor to guide or monitor treatment. Performed at [facility], [place] [address], [place], [place] [address]      Time coordinating discharge: Over 30 minutes  SIGNED:   Shawna Clamp, MD  [facility] 09/24/2020, 10:16 AM Pager   If 7PM-7AM, please contact night-coverage [email or web address]

## 2020-09-23 NOTE — Discharge Instructions (Signed)
Advised to follow-up with primary care physician in 1 week Advised to follow-up with infectious disease doctor for follow-up.  Appointment has been made. Home health services been arranged for patient to get penicillin G IV through the PICC line. Patient has epidural abscess, osteomyelitis getting antibiotics for 4 to 6 weeks.

## 2020-09-23 NOTE — TOC Progression Note (Signed)
Transition of Care [facility]) - Progression Note    Patient Details  Name: Brittany Silva MRN: [id number] Date of Birth: 06-06-1956  [facility]) CM/SW Contact  Sharin Mons, RN Phone Number: 09/23/2020, 2:04 PM  Clinical Narrative:    Pt to d/c to home on 10/24 at which time pt will have caregiver in place to assist.  Per Pam with [facility] Infusion teaching completed with pt. [place] agency to provide RN, [facility] 10/25.  TOC team will continue to monitor for needs....   Expected Discharge Plan: [place] Barriers to Discharge: Continued Medical Work up  Expected Discharge Plan and Services Expected Discharge Plan: [place]   Discharge Planning Services: CM Consult   Living arrangements for the past 2 months: Single Family Home Expected Discharge Date: 09/23/20                   Date DME Agency Contacted: 09/22/20 Time DME Agency Contacted: 85 Representative spoke with at DME Agency: [place]: RN (Pt declined PT/OT services if needed) [place] Agency:  [employer] [facility]) Date HH Agency Contacted: 09/23/20 Time HH Agency Contacted: 77 Representative spoke with at [place]: [place] Determinants of Health ([place]) Interventions Intimate Partner Violence Interventions: Intervention Not Indicated  Readmission Risk Interventions No flowsheet data found.

## 2020-09-24 LAB — GLUCOSE, CAPILLARY
Glucose-Capillary: 226 mg/dL — ABNORMAL HIGH (ref 70–99)
Glucose-Capillary: 173 mg/dL — ABNORMAL HIGH (ref 70–99)

## 2020-09-24 MED ORDER — HEPARIN SOD (PORK) LOCK FLUSH 100 UNIT/ML IV SOLN
250.0000 [IU] | INTRAVENOUS | Status: AC | PRN
  Administered 2020-09-24: 250 [IU]
  Filled 2020-09-24: qty 2.5

## 2020-09-24 MED ORDER — OXYCODONE HCL 5 MG PO TABS: 5.0000 mg | ORAL_TABLET | Freq: Three times a day (TID) | ORAL | 0 refills | Status: DC | PRN

## 2020-09-24 NOTE — Plan of Care (Signed)
  Problem: Education: Goal: Knowledge of General Education information will improve Description Including pain rating scale, medication(s)/side effects and non-pharmacologic comfort measures Outcome: Progressing   Problem: Health Behavior/Discharge Planning: Goal: Ability to manage health-related needs will improve Outcome: Progressing   

## 2020-09-24 NOTE — TOC Transition Note (Signed)
Transition of Care [facility]) - CM/SW Discharge Note   Patient Details  Name: Brittany Silva MRN: [id number] Date of Birth: [birth year]  Transition of Care [facility]) CM/SW Contact:  Erenest Rasher, RN Phone Number: [phone]  09/24/2020, 10:53 AM   Clinical Narrative:     TOC CM spoke to pt and explained CM spoke to with IV Infusion Coordinator, Pam. Her IV abx will be delivered to home at 2-4 pm. She was educated on how to administer. The IV Infusion Coordinator will call her today and [place] RN from [place] will be at her home tomorrow. Her husband will provide transportation.   Final next level of care: [place] Barriers to Discharge: No Barriers Identified   Patient Goals and CMS Choice Patient states their goals for this hospitalization and ongoing recovery are:: confident she can adminster IV abx CMS Medicare.gov Compare Post Acute Care list provided to:: Patient Choice offered to / list presented to : Patient  Discharge Placement                       Discharge Plan and Services   Discharge Planning Services: CM Consult                Date DME Agency Contacted: 09/22/20 Time DME Agency Contacted: 14 Representative spoke with at DME Agency: [place]: RN (Pt declined PT/OT services if needed) [place] Agency:  [employer] HH) Date HH Agency Contacted: 09/23/20 Time [place]: 1 Representative spoke with at [place]: [place] ([place]) Interventions Intimate Partner Violence Interventions: Intervention Not Indicated   Readmission Risk Interventions No flowsheet data found.

## 2020-09-24 NOTE — Plan of Care (Signed)
  Problem: Health Behavior/Discharge Planning: Goal: Ability to manage health-related needs will improve Outcome: Progressing   Problem: Activity: Goal: Risk for activity intolerance will decrease Outcome: Progressing   

## 2020-09-24 NOTE — Progress Notes (Signed)
PROGRESS NOTE    Brittany Silva  ZOX:[id number] DOB: Mar 27, 1956 DOA: 09/11/2020 PCP: Carol Ada, MD    Brief Narrative: This 64 year old female with history of chronic back pain, DM-2, HTN, hypothyroidism, morbid obesity and recent UTI presenting with 5 days hx. of lower back pain, bilateral leg pain, nausea, vomiting, fever and chills. MRI lumbar spine concerning for lumbar epidural abscess around the L5-S1 disc space and soft tissue enhancement on the right posterior to the L4-5 and L5-S1 facet joints. Neurosurgery consulted, and recommended transfer to [facility]. Concerned that a diabetic foot ulcer in her left fourth toe could be source of infection. She also has hardwares in her left foot. The next day, evaluated by neurosurgery who recommended IR biopsy for tissue diagnosis and ID consult for antibiotics. Patient underwent fluoroscopy guided needle aspiration. Tissue culture sent. Started on IV vancomycin, cefepime and Flagyl which was deescalated to vancomycin and ceftriaxone, then to IV penicillin G. MRI left foot concerning for Charcot arthropathy versus osteomyelitis but didn't include the toe with concern.Normal ABI but abnormal TBI.Repeat MRI of left foot consistent with acute osteomyelitis. Podiatry consulted , Patient underwent left fourth toe amputation. Tolerated well, infected bone removed. Patient doesn't need antibiotics from foot Prespective. Patient has PICC line for prolonged IV antibiotics.ID recommended total 6 weeks of antibiotics  Penicillin G , last day would be Nov 23, 21. Home health services been arranged.  Patient is going to get penicillin G through the PICC line. Patient is being discharged today.  Assessment & Plan:   Active Problems:   Type 2 diabetes mellitus with hyperglycemia (HCC)   Abscess in epidural space of lumbar spine   Diabetic foot ulcer (HCC)   Ankle pain   Epidural abscess   S/P amputation of foot, right (HCC)   Acute on  chronic low back pain/radiculopathysec. to L5-S1 epidural abscess/ L4-5 and L5-S1 facet joint infection.  - There are no signs of cauda equina syndrome. -Imagingand ABIas above. TTE without significant finding. CRP 23>>17. ESR 85>>77. Mild leukocytosis. -Neurosurgery consulted and recommended IR biopsy and ID consult for antibiotics. -Underwent fluoroscopy guided needle aspiration of the facet joints. -Initiated Vancomycin, cefepime and Flagyl on 10/14-10/15 -Continued Vancomycin and ceftriaxone 10/15>> 10/17 -Surgical tissue cultures grew few Streptococcus mitis/oralis sensitive to penicillin - Started IV penicillin G 10/17>>may need PICC line. ID to decide timing. -Pain control scheduled Tylenol and tramadol, and IV Dilaudid for breakthrough pain -Bowel regimen with as needed MiraLAX and Senokot-S -Repeat MRI left foot shows acute osteomyelitis of the distal phalanx of left fourth toe. -Podiatry consulted, underwent left fourth toe amputation tolerated well. -As per podiatry she does not need antibiotics from foot standpoint of view.  -ID recommended 6 weeks of penicillin G,last day of antibiotic would be November 23. -PT/OT eval   DM-2 with hypo- and hyperglycemia: On Levemir 75 units twice daily and SSI. A1c 8.6%. -Continue SSI-high.  -IncreasedNovoLog AC from 12 to 15 units. -ContinueLevemir 40 units twice daily. -Continue atorvastatin.  Essential hypertension: -Continue home amlodipine. -Resume home losartan.  Hypothyroidism -Continue home Synthroid.  Hypokalemia-resolved.  Hyponatremia:  Na 132- 136 - 134today. Recheck labs in am   Acute cystitis-treated with IV ceftriaxone x1 and Keflex x3 days -Should be covered with antibiotics as above.  Morbid obesity Body mass index is 44.64 kg/m  DVT prophylaxis: Lovenox Code Status: Full  Family Communication: No one at bed side. Disposition Plan:  Home with home services.  Consultants:     Infectious diseases.  PODIATRY  Procedures: Antimicrobials:  Anti-infectives (From admission, onward)   Start     Dose/Rate Route Frequency Ordered Stop   09/23/20 0000  penicillin G IVPB        24 Million Units Intravenous Over 24 hours 09/23/20 1113 10/28/20 2359   09/17/20 1600  penicillin G potassium 12 Million Units in dextrose 5 % 500 mL continuous infusion        12 Million Units 41.7 mL/hr over 12 Hours Intravenous Every 12 hours 09/17/20 0959     09/15/20 1500  cefTRIAXone (ROCEPHIN) 2 g in sodium chloride 0.9 % 100 mL IVPB  Status:  Discontinued        2 g 200 mL/hr over 30 Minutes Intravenous Every 24 hours 09/15/20 1049 09/17/20 0959   09/15/20 0400  vancomycin (VANCOREADY) IVPB 1250 mg/250 mL  Status:  Discontinued        1,250 mg 166.7 mL/hr over 90 Minutes Intravenous Every 12 hours 09/14/20 1416 09/17/20 0959   09/14/20 1500  vancomycin (VANCOCIN) 2,500 mg in sodium chloride 0.9 % 500 mL IVPB        2,500 mg 250 mL/hr over 120 Minutes Intravenous  Once 09/14/20 1410 09/14/20 1901   09/14/20 1430  metroNIDAZOLE (FLAGYL) IVPB 500 mg  Status:  Discontinued        500 mg 100 mL/hr over 60 Minutes Intravenous Every 8 hours 09/14/20 1358 09/15/20 1343   09/14/20 1430  ceFEPIme (MAXIPIME) 2 g in sodium chloride 0.9 % 100 mL IVPB  Status:  Discontinued        2 g 200 mL/hr over 30 Minutes Intravenous Every 8 hours 09/14/20 1410 09/15/20 1049      Subjective: Patient was seen and examined at bed side, No overnight events, states she wants to make sure she has adequate pain medications if she has worsening pain.  Objective: Vitals:   09/23/20 1424 09/23/20 2002 09/24/20 0352 09/24/20 0900  BP: 133/63 131/72 (!) 142/71 (!) 146/68  Pulse: 84 84 85 87  Resp: '20 19 20 18  ' Temp: 97.8 F (36.6 C) 98 F (36.7 C) 98.1 F (36.7 C) 98.4 F (36.9 C)  TempSrc: Oral Oral Oral Oral  SpO2: 98% 95% 95% 94%  Weight:      Height:        Intake/Output Summary (Last 24 hours) at  09/24/2020 1316 Last data filed at 09/24/2020 0900 Gross per 24 hour  Intake 480 ml  Output 1150 ml  Net -670 ml   Filed Weights   09/13/20 0739 09/21/20 1113  Weight: 129.3 kg 129.3 kg    Examination:  General exam: Appears calm and comfortable  Respiratory system: Clear to auscultation. Respiratory effort normal. Cardiovascular system: S1 & S2 heard, RRR. No JVD, murmurs, rubs, gallops or clicks. No pedal edema. Gastrointestinal system: Abdomen is nondistended, soft and nontender. No organomegaly or masses felt. Normal bowel sounds heard. Central nervous system: Alert and oriented. No focal neurological deficits. Extremities: Dressing noted over Lt 4 toe. Skin: No rashes, lesions or ulcers Psychiatry: Judgement and insight appear normal. Mood & affect appropriate.     Data Reviewed: I have personally reviewed following labs and imaging studies  CBC: Recent Labs  Lab 09/18/20 0359 09/20/20 0210 09/21/20 0418  WBC 11.3* 8.4 8.6  NEUTROABS  --  6.3  --   HGB 12.1 11.5* 11.6*  HCT 36.5 34.9* 34.7*  MCV 87.7 87.7 87.6  PLT 425* 424* 962   Basic Metabolic Panel: Recent Labs  Lab 09/18/20 0359 09/19/20 0253 09/20/20 0210 09/21/20 0418 09/22/20 0405  NA 132*  --  136 130* 134*  K 4.1  --  3.8 3.9 4.8  CL 97*  --  98 95* 96*  CO2 24  --  '27 25 29  ' GLUCOSE 353*  --  109* 187* 266*  BUN 12  --  '8 10 15  ' CREATININE 0.63 0.59 0.59 0.66 0.83  CALCIUM 8.4*  --  8.8* 8.7* 9.0  MG 2.0  --  1.9 1.8  --   PHOS 2.9  --  3.6 2.9  --    GFR: Estimated Creatinine Clearance: 95.9 mL/min (by C-G formula based on SCr of 0.83 mg/dL). Liver Function Tests: Recent Labs  Lab 09/18/20 0359 09/20/20 0210  ALBUMIN 2.3* 2.3*   No results for input(s): LIPASE, AMYLASE in the last 168 hours. No results for input(s): AMMONIA in the last 168 hours. Coagulation Profile: No results for input(s): INR, PROTIME in the last 168 hours. Cardiac Enzymes: No results for input(s): CKTOTAL,  CKMB, CKMBINDEX, TROPONINI in the last 168 hours. BNP (last 3 results) No results for input(s): PROBNP in the last 8760 hours. HbA1C: No results for input(s): HGBA1C in the last 72 hours. CBG: Recent Labs  Lab 09/23/20 1135 09/23/20 1710 09/23/20 2043 09/24/20 0634 09/24/20 1200  GLUCAP 251* 143* 191* 226* 173*   Lipid Profile: No results for input(s): CHOL, HDL, LDLCALC, TRIG, CHOLHDL, LDLDIRECT in the last 72 hours. Thyroid Function Tests: No results for input(s): TSH, T4TOTAL, FREET4, T3FREE, THYROIDAB in the last 72 hours. Anemia Panel: No results for input(s): VITAMINB12, FOLATE, FERRITIN, TIBC, IRON, RETICCTPCT in the last 72 hours. Sepsis Labs: No results for input(s): PROCALCITON, LATICACIDVEN in the last 168 hours.  Recent Results (from the past 240 hour(s))  Aerobic/Anaerobic Culture (surgical/deep wound)     Status: None   Collection Time: 09/14/20  1:52 PM   Specimen: Abscess; Synovial Fluid  Result Value Ref Range Status   Specimen Description ABSCESS  Final   Special Requests RIGHT L4 L5 JOINT  Final   Gram Stain   Final    ABUNDANT WBC PRESENT, PREDOMINANTLY PMN MODERATE GRAM POSITIVE COCCI IN PAIRS IN CHAINS    Culture   Final    FEW STREPTOCOCCUS MITIS/ORALIS NO ANAEROBES ISOLATED Performed at [facility], [address] [place], [place] [address]    Report Status 09/19/2020 FINAL  Final   Organism ID, Bacteria STREPTOCOCCUS MITIS/ORALIS  Final      Susceptibility   Streptococcus mitis/oralis - MIC*    TETRACYCLINE 0.5 SENSITIVE Sensitive     VANCOMYCIN 0.5 SENSITIVE Sensitive     CLINDAMYCIN 0.5 INTERMEDIATE Intermediate     PENICILLIN Value in next row Sensitive      SENSITIVE0.06    CEFTRIAXONE Value in next row Sensitive      SENSITIVE0.12    * FEW STREPTOCOCCUS MITIS/ORALIS  Surgical pcr screen     Status: None   Collection Time: 09/20/20  5:42 PM   Specimen: Nasal Mucosa; Nasal Swab  Result Value Ref Range Status   MRSA, PCR  NEGATIVE NEGATIVE Final   Staphylococcus aureus NEGATIVE NEGATIVE Final    Comment: (NOTE) The Xpert SA Assay (FDA approved for NASAL specimens in patients 34 years of age and older), is one component of a comprehensive surveillance program. It is not intended to diagnose infection nor to guide or monitor treatment. Performed at [facility], [place] [address], [place], [place]  [place]     Radiology Studies: No results found.  Scheduled Meds:  acetaminophen  1,000 mg Oral Q8H   amLODipine  10 mg Oral Daily   atorvastatin  40 mg Oral Daily   Chlorhexidine Gluconate Cloth  6 each Topical Daily   docusate sodium  100 mg Oral BID   DULoxetine  90 mg Oral Daily   enoxaparin (LOVENOX) injection  65 mg Subcutaneous Q24H   insulin aspart  0-20 Units Subcutaneous TID WC   insulin aspart  0-5 Units Subcutaneous QHS   insulin aspart  15 Units Subcutaneous TID WC   insulin detemir  50 Units Subcutaneous BID   levothyroxine  75 mcg Oral Q0600   losartan  50 mg Oral Daily   multivitamin with minerals  1 tablet Oral Daily   Ensure Max Protein  11 oz Oral QHS   senna-docusate  1 tablet Oral BID   traMADol  50 mg Oral Q8H   Continuous Infusions:  lactated ringers 10 mL/hr at 09/21/20 1134   penicillin g continuous IV infusion 12 Million Units (09/24/20 0819)     LOS: 12 days    Time spent: 25 mins.    Shawna Clamp, MD [facility]   If 7PM-7AM, please contact night-coverage

## 2020-09-24 NOTE — Progress Notes (Signed)
Pt given discharge instructions and gone over with her and husband present, verbalized understanding. All belongings gathered to be sent home. PICC capped for home use. Husband to transport home.

## 2020-09-25 LAB — SURGICAL PATHOLOGY

## 2020-09-29 ENCOUNTER — Ambulatory Visit (INDEPENDENT_AMBULATORY_CARE_PROVIDER_SITE_OTHER): Payer: 59 | Admitting: Podiatry

## 2020-09-29 ENCOUNTER — Other Ambulatory Visit: Payer: Self-pay

## 2020-09-29 ENCOUNTER — Ambulatory Visit (INDEPENDENT_AMBULATORY_CARE_PROVIDER_SITE_OTHER): Payer: 59

## 2020-09-29 DIAGNOSIS — S98132A Complete traumatic amputation of one left lesser toe, initial encounter: Secondary | ICD-10-CM

## 2020-09-29 DIAGNOSIS — S98131A Complete traumatic amputation of one right lesser toe, initial encounter: Secondary | ICD-10-CM

## 2020-10-01 ENCOUNTER — Encounter: Payer: Self-pay | Admitting: Podiatry

## 2020-10-01 NOTE — Progress Notes (Signed)
Subjective:  Patient ID: Brittany Silva, female    DOB: 1956-01-25,  MRN: [id number]  Chief Complaint  Patient presents with  . Routine Post Op    PT stated that she is concerned about infection and is in pain     DOS: 09/21/2020 Procedure: Left fourth digit partial amputation  64 y.o. female returns for post-op check.  Patient states doing doing well.  The incision is intact.  She has not been changing the bandage.  She has been weightbearing as tolerated with the treatment.  She denies any other acute complaints.  She denies any other signs of infection.  Review of Systems: Negative except as noted in the HPI. Denies N/V/F/Ch.  Past Medical History:  Diagnosis Date  . Ankle fracture    left  . Anxiety    chronic  . Cervical back pain with evidence of disc disease   . Diabetes mellitus   . Dyslipidemia   . Edema    lower extremity  . GERD (gastroesophageal reflux disease)   . Hyperlipidemia   . Hypertension   . Hypothyroid   . Obstructive apnea    Split study 05-22-12 - patient was too claustrophobic and did not persue CPAP.   Marland Kitchen Osteoarthritis   . PVC's (premature ventricular contractions)   . Retinopathy    diabetic, mild, nonproliferative bilateral  . TSH (thyroid-stimulating hormone deficiency)    evaluation off RX in 4/14  . Vitamin D deficiency     Current Outpatient Medications:  .  acetaminophen (TYLENOL) 500 MG tablet, Take 500-2,000 mg by mouth as needed for moderate pain. , Disp: , Rfl:  .  amLODipine (NORVASC) 5 MG tablet, Take 1 tablet (5 mg total) by mouth daily., Disp: 90 tablet, Rfl: 3 .  atorvastatin (LIPITOR) 40 MG tablet, Take 40 mg by mouth daily., Disp: , Rfl:  .  B-D ULTRAFINE III SHORT PEN 31G X 8 MM MISC, Inject into the skin as directed., Disp: , Rfl:  .  BD INSULIN SYRINGE U/F 31G X 5/16" 1 ML MISC, See admin instructions. with insulin, Disp: , Rfl:  .  cyclobenzaprine (FLEXERIL) 5 MG tablet, Take 5 mg by mouth 3 (three) times daily as  needed., Disp: , Rfl:  .  DULoxetine (CYMBALTA) 30 MG capsule, Take 90 mg by mouth daily., Disp: , Rfl:  .  furosemide (LASIX) 20 MG tablet, Take 40 mg by mouth daily. , Disp: , Rfl:  .  HUMALOG KWIKPEN 100 UNIT/ML KwikPen, Inject 15-25 Units into the skin in the morning, at noon, and at bedtime., Disp: , Rfl:  .  LEVEMIR 100 UNIT/ML injection, Inject 75 Units into the skin in the morning and at bedtime., Disp: , Rfl:  .  losartan (COZAAR) 100 MG tablet, , Disp: , Rfl:  .  meloxicam (MOBIC) 15 MG tablet, Take 15 mg by mouth daily., Disp: , Rfl:  .  NOVOFINE PEN NEEDLE 32G X 6 MM MISC, Inject into the skin daily., Disp: , Rfl:  .  ondansetron (ZOFRAN ODT) 4 MG disintegrating tablet, Take 1 tablet (4 mg total) by mouth every 8 (eight) hours as needed for nausea or vomiting., Disp: 15 tablet, Rfl: 0 .  oxyCODONE (ROXICODONE) 5 MG immediate release tablet, Take 1 tablet (5 mg total) by mouth every 8 (eight) hours as needed., Disp: 10 tablet, Rfl: 0 .  penicillin G IVPB, Inject 24 Million Units into the vein over 24 hr. As continuous infusion   Indication:  osteomyelitis First Dose: No  Last Day of Therapy:  10/24/20 Labs - Once weekly:  CBC/D and BMP, Labs - Every other week:  ESR and CRP Method of administration: Elastomeric (Continuous infusion) Method of administration may be changed at the discretion of home infusion pharmacist based upon assessment of the patient and/or caregiver's ability to self-administer the medication ordered., Disp: 35 Units, Rfl: 0 .  potassium chloride (KLOR-CON) 8 MEQ tablet, Take 8 mEq by mouth 2 (two) times daily as needed (low potassium). , Disp: , Rfl:  .  SYNTHROID 75 MCG tablet, Take 75 mcg by mouth every morning., Disp: , Rfl:  .  traMADol (ULTRAM) 50 MG tablet, Take 1 tablet (50 mg total) by mouth every 8 (eight) hours., Disp: 15 tablet, Rfl: 0  Social History   Tobacco Use  Smoking Status Never Smoker  Smokeless Tobacco Never Used    Allergies  Allergen  Reactions  . Hydrochlorothiazide     rash  . Lisinopril     Other reaction(s): Cough  . Other Other (See Comments)  . Adhesive [Tape]   . Codeine Phosphate     REACTION: throat swelling  . Darvon Other (See Comments)    Jittering, skin problems  . Darvon [Propoxyphene Hcl]     Hyperactivity; "makes me crazy"  . Hydrocodone-Acetaminophen Itching    *Has tolerated hydromorphone as an adult* Per pt, when she was a child had throat itching, difficulty breathing and feel strange and also vomit   . Propoxyphene Hcl     REACTION: Makes her crazy  . Latex Rash  . Neosporin [Neomycin-Bacitracin Zn-Polymyx] Rash  . Sulfamethoxazole Itching and Rash   Objective:  There were no vitals filed for this visit. There is no height or weight on file to calculate BMI. Constitutional Well developed. Well nourished.  Vascular Foot warm and well perfused. Capillary refill normal to all digits.   Neurologic Normal speech. Oriented to person, place, and time. Epicritic sensation to light touch grossly present bilaterally.  Dermatologic Skin healing well without signs of infection. Skin edges well coapted without signs of infection.  Orthopedic: Tenderness to palpation noted about the surgical site.   Radiographs: Three views of skeletally mature adult left foot: Partial fourth digit amputation noted.  Sharp surgical margins.  No clinical signs of infection noted. Assessment:   1. Amputation of toe of left foot ([place])    Plan:  Patient was evaluated and treated and all questions answered.  S/p foot surgery left -Progressing as expected post-operatively. -XR: See above -WB Status: Weightbearing as tolerated in surgical shoe -Sutures: Intact.  No signs of dehiscence noted.  No signs of infection noted. -Medications: None -Foot redressed.  No follow-ups on file.

## 2020-10-02 ENCOUNTER — Encounter (HOSPITAL_COMMUNITY): Payer: Self-pay | Admitting: Podiatry

## 2020-10-09 ENCOUNTER — Encounter: Payer: Self-pay | Admitting: Infectious Disease

## 2020-10-11 ENCOUNTER — Encounter: Payer: Self-pay | Admitting: Podiatry

## 2020-10-11 ENCOUNTER — Other Ambulatory Visit: Payer: Self-pay

## 2020-10-11 ENCOUNTER — Ambulatory Visit (INDEPENDENT_AMBULATORY_CARE_PROVIDER_SITE_OTHER): Payer: Medicare Other | Admitting: Podiatry

## 2020-10-11 DIAGNOSIS — S98132A Complete traumatic amputation of one left lesser toe, initial encounter: Secondary | ICD-10-CM

## 2020-10-11 DIAGNOSIS — M25579 Pain in unspecified ankle and joints of unspecified foot: Secondary | ICD-10-CM

## 2020-10-11 NOTE — Progress Notes (Signed)
Subjective:  Patient ID: Brittany Silva, female    DOB: 1956-08-24,  MRN: [id number]  Chief Complaint  Patient presents with  . Routine Post Op    pov # 2     DOS: 09/21/2020 Procedure: Left fourth digit partial amputation  64 y.o. female returns for post-op check.  Patient states doing doing well.  The incision is intact.  She has not been changing the bandage.  She has been weightbearing as tolerated with the treatment.  She denies any other acute complaints.  She denies any other signs of infection.  Review of Systems: Negative except as noted in the HPI. Denies N/V/F/Ch.  Past Medical History:  Diagnosis Date  . Ankle fracture    left  . Anxiety    chronic  . Cervical back pain with evidence of disc disease   . Diabetes mellitus   . Dyslipidemia   . Edema    lower extremity  . GERD (gastroesophageal reflux disease)   . Hyperlipidemia   . Hypertension   . Hypothyroid   . Obstructive apnea    Split study 05-22-12 - patient was too claustrophobic and did not persue CPAP.   Marland Kitchen Osteoarthritis   . PVC's (premature ventricular contractions)   . Retinopathy    diabetic, mild, nonproliferative bilateral  . TSH (thyroid-stimulating hormone deficiency)    evaluation off RX in 4/14  . Vitamin D deficiency     Current Outpatient Medications:  .  acetaminophen (TYLENOL) 500 MG tablet, Take 500-2,000 mg by mouth as needed for moderate pain. , Disp: , Rfl:  .  amLODipine (NORVASC) 5 MG tablet, Take 1 tablet (5 mg total) by mouth daily., Disp: 90 tablet, Rfl: 3 .  atorvastatin (LIPITOR) 40 MG tablet, Take 40 mg by mouth daily., Disp: , Rfl:  .  B-D ULTRAFINE III SHORT PEN 31G X 8 MM MISC, Inject into the skin as directed., Disp: , Rfl:  .  BD INSULIN SYRINGE U/F 31G X 5/16" 1 ML MISC, See admin instructions. with insulin, Disp: , Rfl:  .  cyclobenzaprine (FLEXERIL) 5 MG tablet, Take 5 mg by mouth 3 (three) times daily as needed., Disp: , Rfl:  .  DULoxetine (CYMBALTA) 30 MG  capsule, Take 90 mg by mouth daily., Disp: , Rfl:  .  furosemide (LASIX) 20 MG tablet, Take 40 mg by mouth daily. , Disp: , Rfl:  .  HUMALOG KWIKPEN 100 UNIT/ML KwikPen, Inject 15-25 Units into the skin in the morning, at noon, and at bedtime., Disp: , Rfl:  .  LEVEMIR 100 UNIT/ML injection, Inject 75 Units into the skin in the morning and at bedtime., Disp: , Rfl:  .  losartan (COZAAR) 100 MG tablet, , Disp: , Rfl:  .  meloxicam (MOBIC) 15 MG tablet, Take 15 mg by mouth daily., Disp: , Rfl:  .  NOVOFINE PEN NEEDLE 32G X 6 MM MISC, Inject into the skin daily., Disp: , Rfl:  .  ondansetron (ZOFRAN ODT) 4 MG disintegrating tablet, Take 1 tablet (4 mg total) by mouth every 8 (eight) hours as needed for nausea or vomiting., Disp: 15 tablet, Rfl: 0 .  oxyCODONE (ROXICODONE) 5 MG immediate release tablet, Take 1 tablet (5 mg total) by mouth every 8 (eight) hours as needed., Disp: 10 tablet, Rfl: 0 .  penicillin G IVPB, Inject 24 Million Units into the vein over 24 hr. As continuous infusion   Indication:  osteomyelitis First Dose: No Last Day of Therapy:  10/24/20 Labs - Once  weekly:  CBC/D and BMP, Labs - Every other week:  ESR and CRP Method of administration: Elastomeric (Continuous infusion) Method of administration may be changed at the discretion of home infusion pharmacist based upon assessment of the patient and/or caregiver's ability to self-administer the medication ordered., Disp: 35 Units, Rfl: 0 .  potassium chloride (KLOR-CON) 8 MEQ tablet, Take 8 mEq by mouth 2 (two) times daily as needed (low potassium). , Disp: , Rfl:  .  SYNTHROID 75 MCG tablet, Take 75 mcg by mouth every morning., Disp: , Rfl:  .  traMADol (ULTRAM) 50 MG tablet, Take 1 tablet (50 mg total) by mouth every 8 (eight) hours., Disp: 15 tablet, Rfl: 0  Social History   Tobacco Use  Smoking Status Never Smoker  Smokeless Tobacco Never Used    Allergies  Allergen Reactions  . Hydrochlorothiazide     rash  . Lisinopril       Other reaction(s): Cough  . Other Other (See Comments)  . Adhesive [Tape]   . Codeine Phosphate     REACTION: throat swelling  . Darvon Other (See Comments)    Jittering, skin problems  . Darvon [Propoxyphene Hcl]     Hyperactivity; "makes me crazy"  . Hydrocodone-Acetaminophen Itching    *Has tolerated hydromorphone as an adult* Per pt, when she was a child had throat itching, difficulty breathing and feel strange and also vomit   . Propoxyphene Hcl     REACTION: Makes her crazy  . Latex Rash  . Neosporin [Neomycin-Bacitracin Zn-Polymyx] Rash  . Sulfamethoxazole Itching and Rash   Objective:  There were no vitals filed for this visit. There is no height or weight on file to calculate BMI. Constitutional Well developed. Well nourished.  Vascular Foot warm and well perfused. Capillary refill normal to all digits.   Neurologic Normal speech. Oriented to person, place, and time. Epicritic sensation to light touch grossly present bilaterally.  Dermatologic  completely reepithelialized.  Orthopedic:  No tenderness to palpation noted about the surgical site.   Radiographs: Three views of skeletally mature adult left foot: Partial fourth digit amputation noted.  Sharp surgical margins.  No clinical signs of infection noted. Assessment:   1. Amputation of toe of left foot ([place])    Plan:  Patient was evaluated and treated and all questions answered.  S/p foot surgery left -Progressing as expected post-operatively. -XR: See above -WB Status: Transition to regular shoes.  Ideally I want her to be in diabetic shoes. -Sutures: Removed no dehiscence noted.  No infection noted. -Medications: None   History of digital amputation -I explained to patient the etiology of digital amputation versus treatment options were discussed.  Given that she has a history of ulceration with infection and leading to amputation she will benefit from diabetic shoes/insoles. -Should be scheduled  see rec for diabetic shoes and insoles  No follow-ups on file.

## 2020-10-12 ENCOUNTER — Encounter (HOSPITAL_COMMUNITY): Payer: Self-pay

## 2020-10-16 ENCOUNTER — Encounter: Payer: Self-pay | Admitting: Infectious Disease

## 2020-10-23 ENCOUNTER — Telehealth: Payer: Self-pay

## 2020-10-23 ENCOUNTER — Ambulatory Visit (INDEPENDENT_AMBULATORY_CARE_PROVIDER_SITE_OTHER): Payer: Medicare Other | Admitting: Infectious Disease

## 2020-10-23 ENCOUNTER — Other Ambulatory Visit: Payer: Self-pay

## 2020-10-23 DIAGNOSIS — Z89431 Acquired absence of right foot: Secondary | ICD-10-CM

## 2020-10-23 DIAGNOSIS — G061 Intraspinal abscess and granuloma: Secondary | ICD-10-CM

## 2020-10-23 DIAGNOSIS — F411 Generalized anxiety disorder: Secondary | ICD-10-CM | POA: Diagnosis not present

## 2020-10-23 DIAGNOSIS — G062 Extradural and subdural abscess, unspecified: Secondary | ICD-10-CM

## 2020-10-23 DIAGNOSIS — E11621 Type 2 diabetes mellitus with foot ulcer: Secondary | ICD-10-CM | POA: Diagnosis not present

## 2020-10-23 DIAGNOSIS — L97529 Non-pressure chronic ulcer of other part of left foot with unspecified severity: Secondary | ICD-10-CM

## 2020-10-23 MED ORDER — AMOXICILLIN 500 MG PO CAPS: 500.0000 mg | ORAL_CAPSULE | Freq: Three times a day (TID) | ORAL | 1 refills | Status: DC

## 2020-10-23 NOTE — Progress Notes (Signed)
Subjective:    Patient ID: Brittany Silva, female    DOB: Feb 12, 1956, 64 y.o.   MRN: [id number]  Chief complaints: Continued low back pain that is like a toothache, problems with emptying bladder and with constipation  HPI   64 y.o. female with  L4-5 facet joint and epidural abscess. Status post IR guided aspiration with Strep mitis/oralis S to PCN and now on high dose penicillin. 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 Posey Pronto.  She remains on penicillin which is due to and tomorrow.  Back pain is dramatically improved versus when she was in the hospital and requiring IV Dilaudid.  She says she takes tramadol but not every day for pain.  Pain is still there more so when she bears weight but also at rest and she describes it as a dull toothache.  She is also suffering from problems with constipation and difficulty emptying her urine.  These existed when she was in the hospital as well and her disc infection was being managed with antibiotics and pain medications.  Her foot seems to be healing up well and she does not complain of pain there.    Past Medical History:  Diagnosis Date  . Ankle fracture    left  . Anxiety    chronic  . Cervical back pain with evidence of disc disease   . Diabetes mellitus   . Dyslipidemia   . Edema    lower extremity  . GERD (gastroesophageal reflux disease)   . Hyperlipidemia   . Hypertension   . Hypothyroid   . Obstructive apnea    Split study 05-22-12 - patient was too claustrophobic and did not persue CPAP.   Marland Kitchen Osteoarthritis   . PVC's (premature ventricular contractions)   . Retinopathy    diabetic, mild, nonproliferative bilateral  . TSH (thyroid-stimulating hormone deficiency)    evaluation off RX in 4/14  . Vitamin D deficiency     Past Surgical History:  Procedure Laterality Date  . ABDOMINAL HYSTERECTOMY    . AMPUTATION Left 09/21/2020   Procedure: AMPUTATION LEFT 4TH TOE;  Surgeon: Felipa Furnace, DPM;   Location: [facility];  Service: Podiatry;  Laterality: Left;  . APPLICATION OF A-CELL OF EXTREMITY Left 09/21/2020   Procedure: APPLICATION OF A-CELL, left foot;  Surgeon: Felipa Furnace, DPM;  Location: [facility];  Service: Podiatry;  Laterality: Left;  . CATARACT EXTRACTION Bilateral 2012  . EYE SURGERY Right 06/26/11   cataract removal Rt eye  . FOOT SURGERY     Ankle fracture  . index finger surgery  2007   spider bite  . IR RADIOLOGIST EVAL & MGMT  09/14/2020  . ORIF ANKLE FRACTURE Left 2947,6546  . Right knee surgery  1966   torn cartilage  . TONSILLECTOMY  1962   adenoidectomy  . TUBAL LIGATION Bilateral 1986    Family History  Problem Relation Age of Onset  . Coronary artery disease Father        MI at age 55  . Hypertension Mother   . Thyroid disease Mother   . Ulcers Sister   . Allergies Sister   . Psoriasis Sister   . Diabetes Maternal Grandfather   . Heart attack Paternal Grandfather   . Colon cancer Neg Hx       Social History   Socioeconomic History  . Marital status: Married    Spouse name: Not on file  . Number of children: 2  . Years of  education: 40  . Highest education level: Not on file  Occupational History    Employer: UNEMPLOYED    Comment: [employer]  Tobacco Use  . Smoking status: Never Smoker  . Smokeless tobacco: Never Used  Vaping Use  . Vaping Use: Never used  Substance and Sexual Activity  . Alcohol use: No  . Drug use: No  . Sexual activity: Not on file  Other Topics Concern  . Not on file  Social History Narrative  . Not on file   Social Determinants of Health   Financial Resource Strain:   . Difficulty of Paying Living Expenses: Not on file  Food Insecurity:   . Worried About Charity fundraiser in the Last Year: Not on file  . Ran Out of Food in the Last Year: Not on file  Transportation Needs:   . Lack of Transportation (Medical): Not on file  . Lack of Transportation (Non-Medical): Not on file  Physical Activity:   .  Days of Exercise per Week: Not on file  . Minutes of Exercise per Session: Not on file  Stress:   . Feeling of Stress : Not on file  Social Connections:   . Frequency of Communication with Friends and Family: Not on file  . Frequency of Social Gatherings with Friends and Family: Not on file  . Attends Religious Services: Not on file  . Active Member of Clubs or Organizations: Not on file  . Attends Archivist Meetings: Not on file  . Marital Status: Not on file    Allergies  Allergen Reactions  . Hydrochlorothiazide     rash  . Lisinopril     Other reaction(s): Cough  . Other Other (See Comments)  . Adhesive [Tape]   . Codeine Phosphate     REACTION: throat swelling  . Darvon Other (See Comments)    Jittering, skin problems  . Darvon [Propoxyphene Hcl]     Hyperactivity; "makes me crazy"  . Hydrocodone-Acetaminophen Itching    *Has tolerated hydromorphone as an adult* Per pt, when she was a child had throat itching, difficulty breathing and feel strange and also vomit   . Propoxyphene Hcl     REACTION: Makes her crazy  . Latex Rash  . Neosporin [Neomycin-Bacitracin Zn-Polymyx] Rash  . Sulfamethoxazole Itching and Rash     Current Outpatient Medications:  .  acetaminophen (TYLENOL) 500 MG tablet, Take 500-2,000 mg by mouth as needed for moderate pain. , Disp: , Rfl:  .  amLODipine (NORVASC) 5 MG tablet, Take 1 tablet (5 mg total) by mouth daily., Disp: 90 tablet, Rfl: 3 .  amoxicillin (AMOXIL) 500 MG capsule, Take 1 capsule (500 mg total) by mouth 3 (three) times daily., Disp: 90 capsule, Rfl: 1 .  atorvastatin (LIPITOR) 40 MG tablet, Take 40 mg by mouth daily., Disp: , Rfl:  .  B-D ULTRAFINE III SHORT PEN 31G X 8 MM MISC, Inject into the skin as directed., Disp: , Rfl:  .  BD INSULIN SYRINGE U/F 31G X 5/16" 1 ML MISC, See admin instructions. with insulin, Disp: , Rfl:  .  cyclobenzaprine (FLEXERIL) 5 MG tablet, Take 5 mg by mouth 3 (three) times daily as  needed., Disp: , Rfl:  .  DULoxetine (CYMBALTA) 30 MG capsule, Take 90 mg by mouth daily., Disp: , Rfl:  .  furosemide (LASIX) 20 MG tablet, Take 40 mg by mouth daily. , Disp: , Rfl:  .  HUMALOG KWIKPEN 100 UNIT/ML KwikPen, Inject 15-25 Units  into the skin in the morning, at noon, and at bedtime., Disp: , Rfl:  .  LEVEMIR 100 UNIT/ML injection, Inject 75 Units into the skin in the morning and at bedtime., Disp: , Rfl:  .  losartan (COZAAR) 100 MG tablet, , Disp: , Rfl:  .  meloxicam (MOBIC) 15 MG tablet, Take 15 mg by mouth daily., Disp: , Rfl:  .  NOVOFINE PEN NEEDLE 32G X 6 MM MISC, Inject into the skin daily., Disp: , Rfl:  .  ondansetron (ZOFRAN ODT) 4 MG disintegrating tablet, Take 1 tablet (4 mg total) by mouth every 8 (eight) hours as needed for nausea or vomiting., Disp: 15 tablet, Rfl: 0 .  oxyCODONE (ROXICODONE) 5 MG immediate release tablet, Take 1 tablet (5 mg total) by mouth every 8 (eight) hours as needed., Disp: 10 tablet, Rfl: 0 .  potassium chloride (KLOR-CON) 8 MEQ tablet, Take 8 mEq by mouth 2 (two) times daily as needed (low potassium). , Disp: , Rfl:  .  SYNTHROID 75 MCG tablet, Take 75 mcg by mouth every morning., Disp: , Rfl:  .  traMADol (ULTRAM) 50 MG tablet, Take 1 tablet (50 mg total) by mouth every 8 (eight) hours., Disp: 15 tablet, Rfl: 0    Review of Systems  Constitutional: Negative for activity change, appetite change, chills, diaphoresis, fatigue, fever and unexpected weight change.  HENT: Negative for congestion, rhinorrhea, sinus pressure, sneezing, sore throat and trouble swallowing.   Eyes: Negative for photophobia and visual disturbance.  Respiratory: Negative for cough, chest tightness, shortness of breath, wheezing and stridor.   Cardiovascular: Negative for chest pain, palpitations and leg swelling.  Gastrointestinal: Positive for constipation. Negative for abdominal distention, abdominal pain, anal bleeding, blood in stool, diarrhea, nausea and  vomiting.  Genitourinary: Positive for difficulty urinating. Negative for dysuria, flank pain and hematuria.  Musculoskeletal: Positive for back pain. Negative for arthralgias, gait problem, joint swelling and myalgias.  Skin: Negative for color change, pallor, rash and wound.  Neurological: Negative for dizziness, tremors, weakness and light-headedness.  Hematological: Negative for adenopathy. Does not bruise/bleed easily.  Psychiatric/Behavioral: Negative for agitation, behavioral problems, confusion, decreased concentration, dysphoric mood and sleep disturbance.       Objective:   Physical Exam Constitutional:      General: She is not in acute distress.    Appearance: Normal appearance. She is well-developed. She is not ill-appearing or diaphoretic.  HENT:     Head: Normocephalic and atraumatic.     Right Ear: Hearing and external ear normal.     Left Ear: Hearing and external ear normal.     Nose: No nasal deformity or rhinorrhea.  Eyes:     General: No scleral icterus.    Conjunctiva/sclera: Conjunctivae normal.     Right eye: Right conjunctiva is not injected.     Left eye: Left conjunctiva is not injected.     Pupils: Pupils are equal, round, and reactive to light.  Neck:     Vascular: No JVD.  Cardiovascular:     Rate and Rhythm: Normal rate and regular rhythm.     Heart sounds: S1 normal and S2 normal.  Pulmonary:     Effort: Pulmonary effort is normal. No respiratory distress.     Breath sounds: No wheezing.  Abdominal:     General: There is no distension.     Palpations: Abdomen is soft.  Musculoskeletal:        General: Normal range of motion.     Right shoulder:  Normal.     Left shoulder: Normal.     Cervical back: Normal range of motion and neck supple.     Right hip: Normal.     Left hip: Normal.     Right knee: Normal.     Left knee: Normal.  Lymphadenopathy:     Head:     Right side of head: No submandibular, preauricular or posterior auricular  adenopathy.     Left side of head: No submandibular, preauricular or posterior auricular adenopathy.     Cervical: No cervical adenopathy.     Right cervical: No superficial or deep cervical adenopathy.    Left cervical: No superficial or deep cervical adenopathy.  Skin:    General: Skin is warm and dry.     Coloration: Skin is not pale.     Findings: No abrasion, bruising, ecchymosis, erythema, lesion or rash.     Nails: There is no clubbing.  Neurological:     General: No focal deficit present.     Mental Status: She is alert and oriented to person, place, and time.     Sensory: No sensory deficit.     Coordination: Coordination normal.     Gait: Gait normal.  Psychiatric:        Attention and Perception: She is attentive.        Mood and Affect: Mood is depressed.        Speech: Speech normal.        Behavior: Behavior normal. Behavior is cooperative.        Thought Content: Thought content normal.        Cognition and Memory: Cognition and memory normal.        Judgment: Judgment normal.    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  Complete penicillin and then switch over to amoxicillin 500 mg 3 times daily and follow-up with me in January.  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 Posey Pronto and plain films of shown clear margins and no evidence of infection beyond them.  Continue to monitor low back pain: This is improved however compared to when she wa in the hospital severe infection.  Was urinary emptying and constipation I wonder this year due to the residual effects of tramadol and other opiate that she has received.

## 2020-10-23 NOTE — Telephone Encounter (Signed)
Verbal orders given to [facility] to pull PICC after last dose of PCN 11/23. Patient notified.   Jalaina Salyers Lorita Officer, RN

## 2020-10-31 ENCOUNTER — Telehealth: Payer: Self-pay | Admitting: Family

## 2020-10-31 NOTE — Telephone Encounter (Signed)
Received lab results dated 10/23/20 with potassium levels of 7.8. No additional levels were available as her PICC line has been removed. I spoke with Ms. Cowens and advised her to stop taking any additional potassium and follow up with her PCP for additional lab work before resuming. She was having cramps in her legs that were relieved when taking the potassium. She is not having any cardiac symptoms. Advised to seek further medical care if symptoms develop prior to follow up with PCP.  Terri Piedra, NP 10/31/2020 2:40 PM

## 2020-11-03 ENCOUNTER — Ambulatory Visit: Payer: 59 | Admitting: Podiatry

## 2020-11-13 ENCOUNTER — Ambulatory Visit (INDEPENDENT_AMBULATORY_CARE_PROVIDER_SITE_OTHER): Payer: Medicare Other | Admitting: Orthotics

## 2020-11-13 ENCOUNTER — Other Ambulatory Visit: Payer: Self-pay

## 2020-11-13 DIAGNOSIS — L97529 Non-pressure chronic ulcer of other part of left foot with unspecified severity: Secondary | ICD-10-CM

## 2020-11-13 DIAGNOSIS — E11621 Type 2 diabetes mellitus with foot ulcer: Secondary | ICD-10-CM

## 2020-11-13 DIAGNOSIS — Z89431 Acquired absence of right foot: Secondary | ICD-10-CM

## 2020-11-13 DIAGNOSIS — S98132A Complete traumatic amputation of one left lesser toe, initial encounter: Secondary | ICD-10-CM

## 2020-11-13 NOTE — Progress Notes (Signed)
Patient was measured for med necessity extra depth shoes (x2) w/ 3 pair (x6) custom f/o. Patient was measured w/ brannock to determine size and width.  Foam impression mold was achieved and deemed appropriate for fabrication of  cmfo.   See DPM chart notes for further documentation and dx codes for determination of medical necessity.  Appropriate forms will be sent to PCP to verify and sign off on medical necessity.   HOWEVER, she hasn't been seen by Dr. Chalmers Cater in over 6 months.  Advised to make and appointment and we will place order for shoes then.

## 2020-12-04 ENCOUNTER — Ambulatory Visit: Payer: 59 | Admitting: Infectious Disease

## 2020-12-14 ENCOUNTER — Encounter: Payer: Self-pay | Admitting: Infectious Disease

## 2020-12-14 ENCOUNTER — Ambulatory Visit (INDEPENDENT_AMBULATORY_CARE_PROVIDER_SITE_OTHER): Payer: Medicare Other | Admitting: Infectious Disease

## 2020-12-14 ENCOUNTER — Other Ambulatory Visit: Payer: Self-pay

## 2020-12-14 VITALS — BP 171/82 | HR 94 | Temp 98.4°F | Wt 266.0 lb

## 2020-12-14 DIAGNOSIS — G061 Intraspinal abscess and granuloma: Secondary | ICD-10-CM

## 2020-12-14 DIAGNOSIS — Z89431 Acquired absence of right foot: Secondary | ICD-10-CM | POA: Diagnosis not present

## 2020-12-14 NOTE — Progress Notes (Signed)
Subjective:    Patient ID: Brittany Silva, female    DOB: 09-Jan-1956, 65 y.o.   MRN: [id number]  Chief complaints: Continued low back pain and some numbness when she walks. HPI   65 y.o. female with  L4-5 facet joint and epidural abscess. Status post IR guided aspiration with Strep mitis/oralis S to PCN and now on high dose penicillin. 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 Posey Pronto.  She pleated IV penicillin and then was switched to amoxicillin   Her amputation site the foot has healed up well.  She does continue to have low back pain but she had low back pain that preceded her infection.  She has pain that she notices more when she walks but there is also pain there "all the time.  She also notices numbness bilaterally and tingling sensation on both legs when she walks.  She is notes that when she first got the hospital she was largely wheelchair-bound.  She is due to have EMG studies though she was told they would not be done until she was off antibiotics which I do not understand.  Past Medical History:  Diagnosis Date  . Ankle fracture    left  . Anxiety    chronic  . Cervical back pain with evidence of disc disease   . Diabetes mellitus   . Dyslipidemia   . Edema    lower extremity  . GERD (gastroesophageal reflux disease)   . Hyperlipidemia   . Hypertension   . Hypothyroid   . Obstructive apnea    Split study 05-22-12 - patient was too claustrophobic and did not persue CPAP.   Marland Kitchen Osteoarthritis   . PVC's (premature ventricular contractions)   . Retinopathy    diabetic, mild, nonproliferative bilateral  . TSH (thyroid-stimulating hormone deficiency)    evaluation off RX in 4/14  . Vitamin D deficiency     Past Surgical History:  Procedure Laterality Date  . ABDOMINAL HYSTERECTOMY    . AMPUTATION Left 09/21/2020   Procedure: AMPUTATION LEFT 4TH TOE;  Surgeon: Felipa Furnace, DPM;  Location: [facility];  Service: Podiatry;  Laterality: Left;  . APPLICATION  OF A-CELL OF EXTREMITY Left 09/21/2020   Procedure: APPLICATION OF A-CELL, left foot;  Surgeon: Felipa Furnace, DPM;  Location: [facility];  Service: Podiatry;  Laterality: Left;  . CATARACT EXTRACTION Bilateral 2012  . EYE SURGERY Right 06/26/11   cataract removal Rt eye  . FOOT SURGERY     Ankle fracture  . index finger surgery  2007   spider bite  . IR RADIOLOGIST EVAL & MGMT  09/14/2020  . ORIF ANKLE FRACTURE Left [id number]  . Right knee surgery  1966   torn cartilage  . TONSILLECTOMY  1962   adenoidectomy  . TUBAL LIGATION Bilateral 1986    Family History  Problem Relation Age of Onset  . Coronary artery disease Father        MI at age 37  . Hypertension Mother   . Thyroid disease Mother   . Ulcers Sister   . Allergies Sister   . Psoriasis Sister   . Diabetes Maternal Grandfather   . Heart attack Paternal Grandfather   . Colon cancer Neg Hx       Social History   Socioeconomic History  . Marital status: Married    Spouse name: Not on file  . Number of children: 2  . Years of education: 61  . Highest education level: Not  on file  Occupational History    Employer: UNEMPLOYED    Comment: [employer]  Tobacco Use  . Smoking status: Never Smoker  . Smokeless tobacco: Never Used  Vaping Use  . Vaping Use: Never used  Substance and Sexual Activity  . Alcohol use: No  . Drug use: No  . Sexual activity: Not on file  Other Topics Concern  . Not on file  Social History Narrative  . Not on file   Social Determinants of Health   Financial Resource Strain: Not on file  Food Insecurity: Not on file  Transportation Needs: Not on file  Physical Activity: Not on file  Stress: Not on file  Social Connections: Not on file    Allergies  Allergen Reactions  . Hydrochlorothiazide     rash  . Lisinopril     Other reaction(s): Cough  . Other Other (See Comments)  . Adhesive [Tape]   . Codeine Phosphate     REACTION: throat swelling  . Darvon Other (See  Comments)    Jittering, skin problems  . Darvon [Propoxyphene Hcl]     Hyperactivity; "makes me crazy"  . Hydrocodone-Acetaminophen Itching    *Has tolerated hydromorphone as an adult* Per pt, when she was a child had throat itching, difficulty breathing and feel strange and also vomit   . Propoxyphene Hcl     REACTION: Makes her crazy  . Latex Rash  . Neosporin [Neomycin-Bacitracin Zn-Polymyx] Rash  . Sulfamethoxazole Itching and Rash     Current Outpatient Medications:  .  acetaminophen (TYLENOL) 500 MG tablet, Take 500-2,000 mg by mouth as needed for moderate pain. , Disp: , Rfl:  .  amLODipine (NORVASC) 5 MG tablet, Take 1 tablet (5 mg total) by mouth daily., Disp: 90 tablet, Rfl: 3 .  amoxicillin (AMOXIL) 500 MG capsule, Take 1 capsule (500 mg total) by mouth 3 (three) times daily., Disp: 90 capsule, Rfl: 1 .  atorvastatin (LIPITOR) 40 MG tablet, Take 40 mg by mouth daily., Disp: , Rfl:  .  B-D ULTRAFINE III SHORT PEN 31G X 8 MM MISC, Inject into the skin as directed., Disp: , Rfl:  .  BD INSULIN SYRINGE U/F 31G X 5/16" 1 ML MISC, See admin instructions. with insulin, Disp: , Rfl:  .  cyclobenzaprine (FLEXERIL) 5 MG tablet, Take 5 mg by mouth 3 (three) times daily as needed., Disp: , Rfl:  .  DULoxetine (CYMBALTA) 30 MG capsule, Take 90 mg by mouth daily., Disp: , Rfl:  .  furosemide (LASIX) 20 MG tablet, Take 40 mg by mouth daily. , Disp: , Rfl:  .  HUMALOG KWIKPEN 100 UNIT/ML KwikPen, Inject 15-25 Units into the skin in the morning, at noon, and at bedtime., Disp: , Rfl:  .  LEVEMIR 100 UNIT/ML injection, Inject 75 Units into the skin in the morning and at bedtime., Disp: , Rfl:  .  losartan (COZAAR) 100 MG tablet, , Disp: , Rfl:  .  meloxicam (MOBIC) 15 MG tablet, Take 15 mg by mouth daily., Disp: , Rfl:  .  NOVOFINE PEN NEEDLE 32G X 6 MM MISC, Inject into the skin daily., Disp: , Rfl:  .  ondansetron (ZOFRAN ODT) 4 MG disintegrating tablet, Take 1 tablet (4 mg total) by mouth  every 8 (eight) hours as needed for nausea or vomiting., Disp: 15 tablet, Rfl: 0 .  oxyCODONE (ROXICODONE) 5 MG immediate release tablet, Take 1 tablet (5 mg total) by mouth every 8 (eight) hours as needed., Disp: 10  tablet, Rfl: 0 .  potassium chloride (KLOR-CON) 8 MEQ tablet, Take 8 mEq by mouth 2 (two) times daily as needed (low potassium). , Disp: , Rfl:  .  SYNTHROID 75 MCG tablet, Take 75 mcg by mouth every morning., Disp: , Rfl:  .  traMADol (ULTRAM) 50 MG tablet, Take 1 tablet (50 mg total) by mouth every 8 (eight) hours., Disp: 15 tablet, Rfl: 0    Review of Systems  Constitutional: Negative for activity change, appetite change, chills, diaphoresis, fatigue, fever and unexpected weight change.  HENT: Negative for congestion, rhinorrhea, sinus pressure, sneezing, sore throat and trouble swallowing.   Eyes: Negative for photophobia and visual disturbance.  Respiratory: Negative for cough, chest tightness, shortness of breath, wheezing and stridor.   Cardiovascular: Negative for chest pain, palpitations and leg swelling.  Gastrointestinal: Negative for abdominal distention, abdominal pain, anal bleeding, blood in stool, diarrhea, nausea and vomiting.  Genitourinary: Negative for dysuria, flank pain and hematuria.  Musculoskeletal: Positive for back pain. Negative for arthralgias, gait problem, joint swelling and myalgias.  Skin: Negative for color change, pallor, rash and wound.  Neurological: Negative for dizziness, tremors, weakness and light-headedness.  Hematological: Negative for adenopathy. Does not bruise/bleed easily.  Psychiatric/Behavioral: Negative for agitation, behavioral problems, confusion, decreased concentration, dysphoric mood and sleep disturbance.       Objective:   Physical Exam Constitutional:      General: She is not in acute distress.    Appearance: Normal appearance. She is well-developed. She is not ill-appearing or diaphoretic.  HENT:     Head:  Normocephalic and atraumatic.     Right Ear: Hearing and external ear normal.     Left Ear: Hearing and external ear normal.     Nose: No nasal deformity or rhinorrhea.  Eyes:     General: No scleral icterus.    Conjunctiva/sclera: Conjunctivae normal.     Right eye: Right conjunctiva is not injected.     Left eye: Left conjunctiva is not injected.     Pupils: Pupils are equal, round, and reactive to light.  Neck:     Vascular: No JVD.  Cardiovascular:     Rate and Rhythm: Normal rate and regular rhythm.     Heart sounds: S1 normal and S2 normal.  Pulmonary:     Effort: Pulmonary effort is normal. No respiratory distress.     Breath sounds: No wheezing.  Abdominal:     General: There is no distension.     Palpations: Abdomen is soft.  Musculoskeletal:        General: Normal range of motion.     Right shoulder: Normal.     Left shoulder: Normal.     Cervical back: Normal range of motion and neck supple.     Right hip: Normal.     Left hip: Normal.     Right knee: Normal.     Left knee: Normal.  Lymphadenopathy:     Head:     Right side of head: No submandibular, preauricular or posterior auricular adenopathy.     Left side of head: No submandibular, preauricular or posterior auricular adenopathy.     Cervical: No cervical adenopathy.     Right cervical: No superficial or deep cervical adenopathy.    Left cervical: No superficial or deep cervical adenopathy.  Skin:    General: Skin is warm and dry.     Coloration: Skin is not pale.     Findings: No abrasion, bruising, ecchymosis, erythema, lesion or  rash.     Nails: There is no clubbing.  Neurological:     General: No focal deficit present.     Mental Status: She is alert and oriented to person, place, and time.     Sensory: No sensory deficit.     Coordination: Coordination normal.     Gait: Gait normal.  Psychiatric:        Attention and Perception: She is attentive.        Mood and Affect: Mood is depressed.         Speech: Speech normal.        Behavior: Behavior normal. Behavior is cooperative.        Thought Content: Thought content normal.        Cognition and Memory: Cognition and memory normal.        Judgment: Judgment normal.     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    She finished her amoxicillin.  We will check sed rate CRP metabolic panel and CBC and if labs are encouraging see her in another 2 months off antibiotics.   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

## 2020-12-15 LAB — BASIC METABOLIC PANEL WITH GFR
BUN: 21 mg/dL (ref 7–25)
CO2: 28 mmol/L (ref 20–32)
Calcium: 9.1 mg/dL (ref 8.6–10.4)
Chloride: 104 mmol/L (ref 98–110)
Creat: 0.78 mg/dL (ref 0.50–0.99)
GFR, Est African American: 93 mL/min/{1.73_m2} (ref >=60)
GFR, Est Non African American: 80 mL/min/{1.73_m2} (ref >=60)
Glucose, Bld: 176 mg/dL — ABNORMAL HIGH (ref 65–99)
Potassium: 4 mmol/L (ref 3.5–5.3)
Sodium: 138 mmol/L (ref 135–146)

## 2020-12-15 LAB — CBC WITH DIFFERENTIAL/PLATELET
Absolute Monocytes: 429 cells/uL (ref 200–950)
Basophils Absolute: 38 cells/uL (ref 0–200)
Basophils Relative: 0.6 %
Eosinophils Absolute: 352 cells/uL (ref 15–500)
Eosinophils Relative: 5.5 %
HCT: 40.7 % (ref 35.0–45.0)
Hemoglobin: 13.6 g/dL (ref 11.7–15.5)
Lymphs Abs: 1139 cells/uL (ref 850–3900)
MCH: 29.6 pg (ref 27.0–33.0)
MCHC: 33.4 g/dL (ref 32.0–36.0)
MCV: 88.5 fL (ref 80.0–100.0)
MPV: 9.2 fL (ref 7.5–12.5)
Monocytes Relative: 6.7 %
Neutro Abs: 4442 cells/uL (ref 1500–7800)
Neutrophils Relative %: 69.4 %
Platelets: 240 10*3/uL (ref 140–400)
RBC: 4.6 10*6/uL (ref 3.80–5.10)
RDW: 13.1 % (ref 11.0–15.0)
Total Lymphocyte: 17.8 %
WBC: 6.4 10*3/uL (ref 3.8–10.8)

## 2020-12-15 LAB — SEDIMENTATION RATE: Sed Rate: 6 mm/h (ref 0–30)

## 2020-12-15 LAB — C-REACTIVE PROTEIN: CRP: 2.7 mg/L (ref <8.0)

## 2021-01-29 ENCOUNTER — Ambulatory Visit: Payer: Medicare Other | Attending: Neurological Surgery

## 2021-01-29 ENCOUNTER — Other Ambulatory Visit: Payer: Self-pay

## 2021-01-29 DIAGNOSIS — R42 Dizziness and giddiness: Secondary | ICD-10-CM | POA: Diagnosis present

## 2021-01-29 DIAGNOSIS — M6281 Muscle weakness (generalized): Secondary | ICD-10-CM | POA: Diagnosis present

## 2021-01-29 DIAGNOSIS — Z9181 History of falling: Secondary | ICD-10-CM | POA: Diagnosis present

## 2021-01-29 DIAGNOSIS — R2681 Unsteadiness on feet: Secondary | ICD-10-CM | POA: Diagnosis present

## 2021-01-29 DIAGNOSIS — M62830 Muscle spasm of back: Secondary | ICD-10-CM | POA: Diagnosis present

## 2021-01-29 DIAGNOSIS — R2689 Other abnormalities of gait and mobility: Secondary | ICD-10-CM | POA: Insufficient documentation

## 2021-01-30 ENCOUNTER — Ambulatory Visit: Payer: Medicare Other

## 2021-01-30 DIAGNOSIS — E1140 Type 2 diabetes mellitus with diabetic neuropathy, unspecified: Secondary | ICD-10-CM

## 2021-01-30 DIAGNOSIS — M86672 Other chronic osteomyelitis, left ankle and foot: Secondary | ICD-10-CM

## 2021-01-30 DIAGNOSIS — Z89422 Acquired absence of other left toe(s): Secondary | ICD-10-CM

## 2021-01-30 DIAGNOSIS — M25579 Pain in unspecified ankle and joints of unspecified foot: Secondary | ICD-10-CM

## 2021-01-30 DIAGNOSIS — Z89431 Acquired absence of right foot: Secondary | ICD-10-CM

## 2021-01-30 DIAGNOSIS — S98132A Complete traumatic amputation of one left lesser toe, initial encounter: Secondary | ICD-10-CM

## 2021-01-30 NOTE — Progress Notes (Signed)
Patient picked up diabetic shoes and inserts. All questions were answered. Patient verbalized understanding with instructions. Patient will call if any problems arise.

## 2021-01-30 NOTE — Therapy (Signed)
[place]. [place], [place], [address] Phone: [phone]   Fax:  [phone]  Physical Therapy Evaluation  Patient Details  Name: Brittany Silva MRN: [id number] Date of Birth: December 22, 1955 Referring Provider (PT): Dawley, Theodoro Doing, DO neurology   Encounter Date: 01/29/2021   PT End of Session - 01/29/21 1752    Visit Number 1    Number of Visits 17    Date for PT Re-Evaluation 03/26/21    Authorization Type medicare and [employer]    PT Start Time 1324    PT Stop Time 1530    PT Time Calculation (min) 45 min    Activity Tolerance Patient tolerated treatment well    Behavior During Therapy [facility] for tasks assessed/performed           Past Medical History:  Diagnosis Date  . Ankle fracture    left  . Anxiety    chronic  . Cervical back pain with evidence of disc disease   . Diabetes mellitus   . Dyslipidemia   . Edema    lower extremity  . GERD (gastroesophageal reflux disease)   . Hyperlipidemia   . Hypertension   . Hypothyroid   . Obstructive apnea    Split study 05-22-12 - patient was too claustrophobic and did not persue CPAP.   Marland Kitchen Osteoarthritis   . PVC's (premature ventricular contractions)   . Retinopathy    diabetic, mild, nonproliferative bilateral  . TSH (thyroid-stimulating hormone deficiency)    evaluation off RX in 4/14  . Vitamin D deficiency     Past Surgical History:  Procedure Laterality Date  . ABDOMINAL HYSTERECTOMY    . AMPUTATION Left 09/21/2020   Procedure: AMPUTATION LEFT 4TH TOE;  Surgeon: Felipa Furnace, DPM;  Location: [facility];  Service: Podiatry;  Laterality: Left;  . APPLICATION OF A-CELL OF EXTREMITY Left 09/21/2020   Procedure: APPLICATION OF A-CELL, left foot;  Surgeon: Felipa Furnace, DPM;  Location: [facility];  Service: Podiatry;  Laterality: Left;  . CATARACT EXTRACTION Bilateral 2012  . EYE SURGERY Right 06/26/11   cataract removal Rt eye  . FOOT SURGERY     Ankle fracture   . index finger surgery  2007   spider bite  . IR RADIOLOGIST EVAL & MGMT  09/14/2020  . ORIF ANKLE FRACTURE Left 4010,2725  . Right knee surgery  1966   torn cartilage  . TONSILLECTOMY  1962   adenoidectomy  . TUBAL LIGATION Bilateral 1986    There were no vitals filed for this visit.    Subjective Assessment - 01/29/21 1451    Subjective In october, had an infection and needed her 4th toe amputated, also having 3-4 abscesses at bottom of spine after the infection. things are slowly getting better but has more trouble than used to with walking and getting around. R knee gives out some times (had "cartilage removed around 65yo"). In october was in hospital for ~13 days with ABX inpatient and post discharge. Back hurts a good deal of the time, has a long history of back pain. Having much more trouble walking after a wile, gets tingling with decreased control. Was having some B/B issues after being in hospital which have continued, neuro aware. Also reports feeling some wooziness with swaying back and forth, commonly happening in dark (walking in home at night). Reports her husband has alot of health issues and she is his primary assistance with daily tasks, he can be unsteady  on his feet and with her unsteadiness she does not feel safe helping him - states this is what prompted her to ask about therapy/PT    Pertinent History Broke L ankle about 20 yrs ago with pin and screw placement x 2. had L4-5 facet joint and epidural abscess with guided aspiration October 2021. 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 Posey Pronto. Both hands go numb (chronic since ~2015). Arthritis. T1DM on insulin with peripheral neuropathy of hands and feet.    Limitations Lifting;Standing;Walking;House hold activities    How long can you walk comfortably? 15 - 20 minutes before feeling weakness, legs giving out    Diagnostic tests EMG about 1 month ago for hands - per pt put on gabapentin, hand numbness due to neuropathy     Patient Stated Goals to decrease pain, get more stable with walking    Currently in Pain? Yes    Pain Score 3    worst 5-6/10   Pain Location Back    Pain Orientation Lower    Pain Descriptors / Indicators Shooting;Radiating;Throbbing;Aching;Tightness    Pain Type Chronic pain    Pain Radiating Towards left side of back, down back of both legs N/T              [facility] PT Assessment - 01/29/21 1502      Assessment   Medical Diagnosis Neurogenic claudication, Other vascular myelopathies    Referring Provider (PT) Dawley, Theodoro Doing, DO neurology    Onset Date/Surgical Date --   09/2020   Hand Dominance Right    Next MD Visit In next few months with Dr Dawley    Prior Therapy not for this condition      Balance Screen   Has the patient fallen in the past 6 months Yes    How many times? 2   at home   Has the patient had a decrease in activity level because of a fear of falling?  Yes    Is the patient reluctant to leave their home because of a fear of falling?  Yes      Home Environment   Additional Comments Tried to use a cane before but unable because of hand numbness. Lives at home with husband - primary caretaker for him, "not very stable either". 2 step to enter with difficulty. has steps to go upstairs but do not go upstairs. Son and his family are living on the 2nd level currently/temporarily.  Handrails. part way up on the lef, all the way up on the R      Cognition   Overall Cognitive Status Within Functional Limits for tasks assessed      Posture/Postural Control   Posture/Postural Control Postural limitations    Postural Limitations Rounded Shoulders;Forward head      ROM / Strength   AROM / PROM / Strength AROM      AROM   Overall AROM Comments Grossly 3+/5 BLE.      Standardized Balance Assessment   Standardized Balance Assessment Timed Up and Go Test;Berg Balance Test;Five Times Sit to Stand    Five times sit to stand comments  25 seconds, weight shifted left (pt  reports thats her shorter leg)      Berg Balance Test   Sit to Stand Able to stand  independently using hands    Standing Unsupported Able to stand safely 2 minutes    Sitting with Back Unsupported but Feet Supported on Floor or Stool Able to sit safely  and securely 2 minutes    Stand to Sit Controls descent by using hands    Transfers Able to transfer safely, definite need of hands    Standing Unsupported with Eyes Closed Needs help to keep from falling    Standing Unsupported with Feet Together Able to place feet together independently but unable to hold for 30 seconds    From Standing, Reach Forward with Outstretched Arm Can reach forward >5 cm safely (2")    From Standing Position, Pick up Object from Floor Unable to pick up shoe, but reaches 2-5 cm (1-2") from shoe and balances independently    From Standing Position, Turn to Look Behind Over each Shoulder Turn sideways only but maintains balance    Turn 360 Degrees Able to turn 360 degrees safely but slowly    Standing Unsupported, Alternately Place Feet on Step/Stool Able to complete >2 steps/needs minimal assist    Standing Unsupported, One Foot in Front Able to take small step independently and hold 30 seconds    Standing on One Leg Unable to try or needs assist to prevent fall    Total Score 30    Berg comment: high fall risk      Timed Up and Go Test   TUG Normal TUG    Normal TUG (seconds) 14   antalgic, 1 small LOB with recovery   TUG Comments >13.5 sec indicates high fall risk                      Objective measurements completed on examination: See above findings.               PT Education - 01/29/21 1751    Education Details Initial POC, HEP. Access Code: [email or web address]. Sit to Stand with Counter Support - 1 x daily - 5 x weekly - 3 sets - 10 reps  Seated Long Arc Quad - 1 x daily - 5 x weekly - 3 sets - 10 reps  Side Stepping with Counter Support - 1 x daily - 5 x weekly - 2 sets - 10 reps   Alternating Step Backward with Support - 1 x daily - 5 x weekly - 2 sets - 10 reps    Person(s) Educated Patient    Methods Explanation;Demonstration;Handout    Comprehension Verbalized understanding;Returned demonstration            PT Short Term Goals - 01/29/21 1525      PT SHORT TERM GOAL #1   Title Independent with HEP    Time 2    Period Weeks    Status New    Target Date 02/12/21             PT Long Term Goals - 01/29/21 1525      PT LONG TERM GOAL #1   Title Pt will demo improved berg to at least 45/56 to demo improved balance    Baseline initial 30/56    Time 8    Period Weeks    Status New    Target Date 03/26/21      PT LONG TERM GOAL #2   Title Pt will complete 5TSTS in no greater than 12 seconds to demo improved functional strength and balance    Time 8    Period Weeks    Status New    Target Date 03/26/21      PT LONG TERM GOAL #3   Title Pt demo normalized gait pattern with minimal antalgic  limp and increased symmetry of gait without LOB to facilitate safe negotiation of home an dcommunity environments.    Time 8    Period Weeks    Status New    Target Date 03/26/21      PT LONG TERM GOAL #4   Title BLE strength at least 4+/5    Baseline --    Time 8    Period Weeks    Status New    Target Date 03/27/21                  Plan - 01/29/21 1753    Clinical Impression Statement Pt is a 65 yo female who presents with low back pain, pain tingling and weakness into legs (R>L), and unsteadiness on her feet. Brittany "Tammy" presents with functional LE muscle weakness as demonstrated by 5TSTS score, asymmetrical abnormal gait, decreased balance as demonstrated by score on the berg., and reports feeling wooziness/dizziness at times with instability. As a result of these impairments, Brittany Silva has difficulty with walking for long periods, helping care for her husband, and completing ADLs. PMH is significant for diabetic/peripheral neuropathy, T1DM,  history of RIGHT ankle fracture with internal fixation, history of 4th toe amputation on the left 2021 with low lumbar spinal abcesses that preceded this increasing instability on feet. Brittany Silva will benefit from skilled physical therapy to work towards improving strength and stability, decreasing pain.    Personal Factors and Comorbidities Comorbidity 3+;Past/Current Experience;Time since onset of injury/illness/exacerbation    Examination-Activity Limitations Locomotion Level;Transfers;Stairs;Squat    Stability/Clinical Decision Making Evolving/Moderate complexity    Clinical Decision Making Moderate    Rehab Potential Good    PT Frequency 2x / week    PT Duration 8 weeks    PT Treatment/Interventions ADLs/Self Care Home Management;Cryotherapy;Iontophoresis 4mg /ml Dexamethasone;Moist Heat;Gait training;Neuromuscular re-education;Balance training;Therapeutic exercise;Electrical Stimulation;Therapeutic activities;Functional mobility training;Stair training;Patient/family education;Manual techniques;Energy conservation;Taping;Vasopneumatic Device;Vestibular    PT Next Visit Plan Reassess HEP. Further assess VOR next visit. Keep in mind neuropathy - limited functional grip strength with some numbness in hands. progressive strength and balance training as tolerated. manual and modalities as needed.    PT Home Exercise Plan see pt edu    Consulted and Agree with Plan of Care Patient           Patient will benefit from skilled therapeutic intervention in order to improve the following deficits and impairments:  Abnormal gait,Difficulty walking,Increased muscle spasms,Decreased endurance,Decreased activity tolerance,Pain,Improper body mechanics,Impaired flexibility,Decreased balance,Decreased mobility,Decreased strength,Increased edema,Impaired sensation  Visit Diagnosis: Unsteadiness on feet - Plan: PT plan of care cert/re-cert  History of falling - Plan: PT plan of care cert/re-cert  Muscle  weakness (generalized) - Plan: PT plan of care cert/re-cert  Other abnormalities of gait and mobility - Plan: PT plan of care cert/re-cert  Muscle spasm of back - Plan: PT plan of care cert/re-cert  Dizziness and giddiness - Plan: PT plan of care cert/re-cert     Problem List Patient Active Problem List   Diagnosis Date Noted  . S/P amputation of foot, right ([place])   . Diabetic foot ulcer ([place]) 09/15/2020  . Ankle pain   . Epidural abscess   . Abscess in epidural space of lumbar spine 09/12/2020  . Obstructive apnea   . Sleep disturbance 08/17/2012  . History of mammogram 07/29/2012  . Chest pain 04/06/2012  . EDEMA 05/16/2008  . Morbid obesity ([place]) 04/13/2008  . CARPAL TUNNEL SYNDROME, BILATERAL 01/15/2008  . ALLERGIC RHINITIS, SEASONAL 01/06/2008  . ANXIETY DISORDER, GENERALIZED  05/01/2007  . PERIPHERAL NEUROPATHY 05/01/2007  . ENDOMETRIAL POLYP 05/01/2007  . HYPERPLASIA, ENDOMETRIAL NOS 05/01/2007  . Type 2 diabetes mellitus with hyperglycemia ([place]) 01/29/2007  . HYPERLIPIDEMIA 01/29/2007  . ANEMIA, OTHER, UNSPECIFIED 01/29/2007  . HYPERTENSION, BENIGN SYSTEMIC 01/29/2007    Brittany Silva, PT, DPT 01/30/2021, 9:50 AM  [place]. [place], [place], [address] Phone: [phone]   Fax:  [phone]  Name: BIRGITTA UHLIR MRN: [id number] Date of Birth: 1956-11-16

## 2021-01-31 ENCOUNTER — Encounter: Payer: Self-pay | Admitting: Physical Therapy

## 2021-01-31 ENCOUNTER — Other Ambulatory Visit: Payer: Self-pay

## 2021-01-31 ENCOUNTER — Ambulatory Visit: Payer: Medicare Other | Attending: Neurological Surgery | Admitting: Physical Therapy

## 2021-01-31 DIAGNOSIS — R42 Dizziness and giddiness: Secondary | ICD-10-CM

## 2021-01-31 DIAGNOSIS — Z9181 History of falling: Secondary | ICD-10-CM

## 2021-01-31 DIAGNOSIS — R2689 Other abnormalities of gait and mobility: Secondary | ICD-10-CM | POA: Diagnosis present

## 2021-01-31 DIAGNOSIS — R2681 Unsteadiness on feet: Secondary | ICD-10-CM

## 2021-01-31 DIAGNOSIS — M62830 Muscle spasm of back: Secondary | ICD-10-CM | POA: Diagnosis present

## 2021-01-31 DIAGNOSIS — M6281 Muscle weakness (generalized): Secondary | ICD-10-CM | POA: Insufficient documentation

## 2021-01-31 NOTE — Therapy (Signed)
[address]. [place], [place], [address] Phone: [phone]   Fax:  [phone]  Physical Therapy Treatment  Patient Details  Name: Brittany Silva MRN: [id number] Date of Birth: 11/25/1956 Referring Provider (PT): Dawley, Theodoro Doing, DO neurology   Encounter Date: 01/31/2021   PT End of Session - 01/31/21 1059    Visit Number 2    Number of Visits 17    Date for PT Re-Evaluation 03/26/21    PT Start Time 1016    PT Stop Time 1059    PT Time Calculation (min) 43 min    Activity Tolerance Patient tolerated treatment well    Behavior During Therapy [facility] for tasks assessed/performed           Past Medical History:  Diagnosis Date  . Ankle fracture    left  . Anxiety    chronic  . Cervical back pain with evidence of disc disease   . Diabetes mellitus   . Dyslipidemia   . Edema    lower extremity  . GERD (gastroesophageal reflux disease)   . Hyperlipidemia   . Hypertension   . Hypothyroid   . Obstructive apnea    Split study 05-22-12 - patient was too claustrophobic and did not persue CPAP.   Marland Kitchen Osteoarthritis   . PVC's (premature ventricular contractions)   . Retinopathy    diabetic, mild, nonproliferative bilateral  . TSH (thyroid-stimulating hormone deficiency)    evaluation off RX in 4/14  . Vitamin D deficiency     Past Surgical History:  Procedure Laterality Date  . ABDOMINAL HYSTERECTOMY    . AMPUTATION Left 09/21/2020   Procedure: AMPUTATION LEFT 4TH TOE;  Surgeon: Felipa Furnace, DPM;  Location: [facility];  Service: Podiatry;  Laterality: Left;  . APPLICATION OF A-CELL OF EXTREMITY Left 09/21/2020   Procedure: APPLICATION OF A-CELL, left foot;  Surgeon: Felipa Furnace, DPM;  Location: [facility];  Service: Podiatry;  Laterality: Left;  . CATARACT EXTRACTION Bilateral 2012  . EYE SURGERY Right 06/26/11   cataract removal Rt eye  . FOOT SURGERY     Ankle fracture  . index finger surgery  2007   spider  bite  . IR RADIOLOGIST EVAL & MGMT  09/14/2020  . ORIF ANKLE FRACTURE Left [id number]  . Right knee surgery  1966   torn cartilage  . TONSILLECTOMY  1962   adenoidectomy  . TUBAL LIGATION Bilateral 1986    There were no vitals filed for this visit.   Subjective Assessment - 01/31/21 1026    Subjective Pt reports that HEP is going pretty well; has some LBP during but thinks this is just d/t lack of exercise.    Currently in Pain? Yes    Pain Score 5     Pain Location Back    Pain Orientation Lower                             [facility] Adult PT Treatment/Exercise - 01/31/21 0001      High Level Balance   High Level Balance Activities Side stepping;Backward walking    High Level Balance Comments firm surface, close supervision-CGA      Exercises   Exercises Knee/Hip;Lumbar      Lumbar Exercises: Stretches   Lower Trunk Rotation 5 reps;10 seconds      Lumbar Exercises: Seated   Other Seated Lumbar Exercises seated iso abs with pball  x15 3 sec hold      Lumbar Exercises: Supine   Pelvic Tilt 15 reps    Pelvic Tilt Limitations tactile cues for form      Knee/Hip Exercises: Aerobic   Nustep L5 x 6 min      Knee/Hip Exercises: Seated   Long Arc Quad Both;2 sets;10 reps    Long Arc Quad Weight 2 lbs.    Marching Both;2 sets;10 reps    Marching Limitations 2                    PT Short Term Goals - 01/29/21 1525      PT SHORT TERM GOAL #1   Title Independent with HEP    Time 2    Period Weeks    Status New    Target Date 02/12/21             PT Long Term Goals - 01/29/21 1525      PT LONG TERM GOAL #1   Title Pt will demo improved berg to at least 45/56 to demo improved balance    Baseline initial 30/56    Time 8    Period Weeks    Status New    Target Date 03/26/21      PT LONG TERM GOAL #2   Title Pt will complete 5TSTS in no greater than 12 seconds to demo improved functional strength and balance    Time 8    Period Weeks     Status New    Target Date 03/26/21      PT LONG TERM GOAL #3   Title Pt demo normalized gait pattern with minimal antalgic limp and increased symmetry of gait without LOB to facilitate safe negotiation of home an dcommunity environments.    Time 8    Period Weeks    Status New    Target Date 03/26/21      PT LONG TERM GOAL #4   Title BLE strength at least 4+/5    Baseline --    Time 8    Period Weeks    Status New    Target Date 03/27/21                 Plan - 01/31/21 1100    Clinical Impression Statement Pt reports to clinic with increased LBP during ex's this rx. Added PPT and lumbar stretching ex's to address c/o tightness/pain. Pt did well with strengthening interventions able to complete STS with no UE support from raised mat table. Close supervision-CGA with high level balance ex's. Continue with functional strengthening and balance training.    PT Treatment/Interventions ADLs/Self Care Home Management;Cryotherapy;Iontophoresis 4mg /ml Dexamethasone;Moist Heat;Gait training;Neuromuscular re-education;Balance training;Therapeutic exercise;Electrical Stimulation;Therapeutic activities;Functional mobility training;Stair training;Patient/family education;Manual techniques;Energy conservation;Taping;Vasopneumatic Device;Vestibular    PT Next Visit Plan Reassess HEP. Further assess VOR next visit. Keep in mind neuropathy - limited functional grip strength with some numbness in hands. progressive strength and balance training as tolerated. manual and modalities as needed.    Consulted and Agree with Plan of Care Patient           Patient will benefit from skilled therapeutic intervention in order to improve the following deficits and impairments:  Abnormal gait,Difficulty walking,Increased muscle spasms,Decreased endurance,Decreased activity tolerance,Pain,Improper body mechanics,Impaired flexibility,Decreased balance,Decreased mobility,Decreased strength,Increased  edema,Impaired sensation  Visit Diagnosis: Unsteadiness on feet  History of falling  Muscle weakness (generalized)  Other abnormalities of gait and mobility  Muscle spasm of back  Dizziness and giddiness  Problem List Patient Active Problem List   Diagnosis Date Noted  . S/P amputation of foot, right ([place])   . Diabetic foot ulcer ([place]) 09/15/2020  . Ankle pain   . Epidural abscess   . Abscess in epidural space of lumbar spine 09/12/2020  . Obstructive apnea   . Sleep disturbance 08/17/2012  . History of mammogram 07/29/2012  . Chest pain 04/06/2012  . EDEMA 05/16/2008  . Morbid obesity ([place]) 04/13/2008  . CARPAL TUNNEL SYNDROME, BILATERAL 01/15/2008  . ALLERGIC RHINITIS, SEASONAL 01/06/2008  . ANXIETY DISORDER, GENERALIZED 05/01/2007  . PERIPHERAL NEUROPATHY 05/01/2007  . ENDOMETRIAL POLYP 05/01/2007  . HYPERPLASIA, ENDOMETRIAL NOS 05/01/2007  . Type 2 diabetes mellitus with hyperglycemia ([place]) 01/29/2007  . HYPERLIPIDEMIA 01/29/2007  . ANEMIA, OTHER, UNSPECIFIED 01/29/2007  . HYPERTENSION, BENIGN SYSTEMIC 01/29/2007   Amador Cunas, PT, DPT Donald Prose Adonica Fukushima 01/31/2021, 11:07 AM  [place]. [place], [place], [address] Phone: [phone]   Fax:  [phone]  Name: TACHA MANNI MRN: [id number] Date of Birth: [birth year]

## 2021-02-06 ENCOUNTER — Ambulatory Visit: Payer: Medicare Other | Admitting: Physical Therapy

## 2021-02-08 ENCOUNTER — Encounter: Payer: Self-pay | Admitting: Physical Therapy

## 2021-02-08 ENCOUNTER — Ambulatory Visit: Payer: Medicare Other | Admitting: Physical Therapy

## 2021-02-08 ENCOUNTER — Other Ambulatory Visit: Payer: Self-pay

## 2021-02-08 DIAGNOSIS — M6281 Muscle weakness (generalized): Secondary | ICD-10-CM

## 2021-02-08 DIAGNOSIS — R2681 Unsteadiness on feet: Secondary | ICD-10-CM

## 2021-02-08 DIAGNOSIS — R2689 Other abnormalities of gait and mobility: Secondary | ICD-10-CM

## 2021-02-08 DIAGNOSIS — Z9181 History of falling: Secondary | ICD-10-CM

## 2021-02-08 NOTE — Therapy (Signed)
[place]. [place], [place], [address] Phone: [phone]   Fax:  [phone]  Physical Therapy Treatment  Patient Details  Name: Brittany Silva MRN: [id number] Date of Birth: 04-28-1956 Referring Provider (PT): Dawley, Theodoro Doing, DO neurology   Encounter Date: 02/08/2021   PT End of Session - 02/08/21 1132    Visit Number 3    Number of Visits 17    Date for PT Re-Evaluation 03/26/21    Authorization Type medicare and UHC    PT Start Time 1100    PT Stop Time 1142    PT Time Calculation (min) 42 min    Activity Tolerance Patient tolerated treatment well    Behavior During Therapy [facility] for tasks assessed/performed           Past Medical History:  Diagnosis Date  . Ankle fracture    left  . Anxiety    chronic  . Cervical back pain with evidence of disc disease   . Diabetes mellitus   . Dyslipidemia   . Edema    lower extremity  . GERD (gastroesophageal reflux disease)   . Hyperlipidemia   . Hypertension   . Hypothyroid   . Obstructive apnea    Split study 05-22-12 - patient was too claustrophobic and did not persue CPAP.   Marland Kitchen Osteoarthritis   . PVC's (premature ventricular contractions)   . Retinopathy    diabetic, mild, nonproliferative bilateral  . TSH (thyroid-stimulating hormone deficiency)    evaluation off RX in 4/14  . Vitamin D deficiency     Past Surgical History:  Procedure Laterality Date  . ABDOMINAL HYSTERECTOMY    . AMPUTATION Left 09/21/2020   Procedure: AMPUTATION LEFT 4TH TOE;  Surgeon: Felipa Furnace, DPM;  Location: [place];  Service: Podiatry;  Laterality: Left;  . APPLICATION OF A-CELL OF EXTREMITY Left 09/21/2020   Procedure: APPLICATION OF A-CELL, left foot;  Surgeon: Felipa Furnace, DPM;  Location: [place];  Service: Podiatry;  Laterality: Left;  . CATARACT EXTRACTION Bilateral 2012  . EYE SURGERY Right 06/26/11   cataract removal Rt eye  . FOOT SURGERY     Ankle fracture   . index finger surgery  2007   spider bite  . IR RADIOLOGIST EVAL & MGMT  09/14/2020  . ORIF ANKLE FRACTURE Left [id number]  . Right knee surgery  1966   torn cartilage  . TONSILLECTOMY  1962   adenoidectomy  . TUBAL LIGATION Bilateral 1986    There were no vitals filed for this visit.   Subjective Assessment - 02/08/21 1101    Subjective "Feeling pretty good" Balance is slightly better    Pertinent History Broke L ankle about 20 yrs ago with pin and screw placement x 2. had L4-5 facet joint and epidural abscess with guided aspiration October 2021. 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 Posey Pronto. Both hands go numb (chronic since ~2015). Arthritis. T1DM on insulin with peripheral neuropathy of hands and feet.    Currently in Pain? Yes    Pain Score 2     Pain Location Back                             [facility] Adult PT Treatment/Exercise - 02/08/21 0001      High Level Balance   High Level Balance Activities Side stepping;Backward walking    High Level Balance Comments on airex  reaching outside BOS, on airex eyes closed 5 sec x3      Lumbar Exercises: Standing   Row Theraband;15 reps;Both;Strengthening   x2     Lumbar Exercises: Seated   Long Arc Quad on Chair Both;2 sets;10 reps;Strengthening    LAQ on Chair Weights (lbs) 3    Sit to Stand 5 reps   x3, 2 sets LE on airex   Other Seated Lumbar Exercises seated iso abs with pball x15 3 sec hold      Knee/Hip Exercises: Aerobic   Nustep L5 x 6 min      Knee/Hip Exercises: Seated   Marching Both;2 sets;10 reps    Marching Limitations 3    Hamstring Curl Both;2 sets;10 reps;Strengthening    Hamstring Limitations red Tband                    PT Short Term Goals - 01/29/21 1525      PT SHORT TERM GOAL #1   Title Independent with HEP    Time 2    Period Weeks    Status New    Target Date 02/12/21             PT Long Term Goals - 01/29/21 1525      PT LONG TERM GOAL #1   Title Pt  will demo improved berg to at least 45/56 to demo improved balance    Baseline initial 30/56    Time 8    Period Weeks    Status New    Target Date 03/26/21      PT LONG TERM GOAL #2   Title Pt will complete 5TSTS in no greater than 12 seconds to demo improved functional strength and balance    Time 8    Period Weeks    Status New    Target Date 03/26/21      PT LONG TERM GOAL #3   Title Pt demo normalized gait pattern with minimal antalgic limp and increased symmetry of gait without LOB to facilitate safe negotiation of home an dcommunity environments.    Time 8    Period Weeks    Status New    Target Date 03/26/21      PT LONG TERM GOAL #4   Title BLE strength at least 4+/5    Baseline --    Time 8    Period Weeks    Status New    Target Date 03/27/21                 Plan - 02/08/21 1133    Clinical Impression Statement Pt able to complete the interventions. She has a lot of difficulty with interventions on airex pad. Posterior LOB with on airex reaching outside base of support. Increase in postural swaying with eyes closed requiring min tactile cues to correct. Increase fatigue noted with sit to sands.    Personal Factors and Comorbidities Comorbidity 3+;Past/Current Experience;Time since onset of injury/illness/exacerbation    Examination-Activity Limitations Locomotion Level;Transfers;Stairs;Squat    Stability/Clinical Decision Making Evolving/Moderate complexity    Rehab Potential Good    PT Frequency 2x / week    PT Duration 8 weeks    PT Treatment/Interventions ADLs/Self Care Home Management;Cryotherapy;Iontophoresis 4mg /ml Dexamethasone;Moist Heat;Gait training;Neuromuscular re-education;Balance training;Therapeutic exercise;Electrical Stimulation;Therapeutic activities;Functional mobility training;Stair training;Patient/family education;Manual techniques;Energy conservation;Taping;Vasopneumatic Device;Vestibular    PT Next Visit Plan Further assess OR next  visit. Keep in mind neuropathy - limited functional grip strength with some numbness in hands. progressive strength  and balance training as tolerated. manual and modalities as needed.           Patient will benefit from skilled therapeutic intervention in order to improve the following deficits and impairments:  Abnormal gait,Difficulty walking,Increased muscle spasms,Decreased endurance,Decreased activity tolerance,Pain,Improper body mechanics,Impaired flexibility,Decreased balance,Decreased mobility,Decreased strength,Increased edema,Impaired sensation  Visit Diagnosis: History of falling  Muscle weakness (generalized)  Unsteadiness on feet  Other abnormalities of gait and mobility     Problem List Patient Active Problem List   Diagnosis Date Noted  . S/P amputation of foot, right ([place])   . Diabetic foot ulcer ([place]) 09/15/2020  . Ankle pain   . Epidural abscess   . Abscess in epidural space of lumbar spine 09/12/2020  . Obstructive apnea   . Sleep disturbance 08/17/2012  . History of mammogram 07/29/2012  . Chest pain 04/06/2012  . EDEMA 05/16/2008  . Morbid obesity ([place]) 04/13/2008  . CARPAL TUNNEL SYNDROME, BILATERAL 01/15/2008  . ALLERGIC RHINITIS, SEASONAL 01/06/2008  . ANXIETY DISORDER, GENERALIZED 05/01/2007  . PERIPHERAL NEUROPATHY 05/01/2007  . ENDOMETRIAL POLYP 05/01/2007  . HYPERPLASIA, ENDOMETRIAL NOS 05/01/2007  . Type 2 diabetes mellitus with hyperglycemia ([place]) 01/29/2007  . HYPERLIPIDEMIA 01/29/2007  . ANEMIA, OTHER, UNSPECIFIED 01/29/2007  . HYPERTENSION, BENIGN SYSTEMIC 01/29/2007    Scot Jun 02/08/2021, 11:40 AM  [place]. [place], [place], [address] Phone: [phone]   Fax:  [phone]  Name: Brittany Silva MRN: [id number] Date of Birth: 09-12-1956

## 2021-02-12 ENCOUNTER — Ambulatory Visit: Payer: Medicare Other | Admitting: Physical Therapy

## 2021-02-12 ENCOUNTER — Other Ambulatory Visit: Payer: Self-pay

## 2021-02-12 ENCOUNTER — Encounter: Payer: Self-pay | Admitting: Physical Therapy

## 2021-02-12 DIAGNOSIS — M6281 Muscle weakness (generalized): Secondary | ICD-10-CM

## 2021-02-12 DIAGNOSIS — R2681 Unsteadiness on feet: Secondary | ICD-10-CM

## 2021-02-12 DIAGNOSIS — Z9181 History of falling: Secondary | ICD-10-CM

## 2021-02-12 NOTE — Therapy (Signed)
[place]. [place], [place], [address] Phone: [phone]   Fax:  [phone]  Physical Therapy Treatment  Patient Details  Name: Brittany Silva MRN: [id number] Date of Birth: December 08, 1955 Referring Provider (PT): Dawley, Theodoro Doing, DO neurology   Encounter Date: 02/12/2021   PT End of Session - 02/12/21 1228    Visit Number 4    Number of Visits 17    Date for PT Re-Evaluation 03/26/21    Authorization Type medicare and UHC    PT Start Time 1158    PT Stop Time 1230    PT Time Calculation (min) 32 min    Activity Tolerance Patient tolerated treatment well    Behavior During Therapy [facility] for tasks assessed/performed           Past Medical History:  Diagnosis Date  . Ankle fracture    left  . Anxiety    chronic  . Cervical back pain with evidence of disc disease   . Diabetes mellitus   . Dyslipidemia   . Edema    lower extremity  . GERD (gastroesophageal reflux disease)   . Hyperlipidemia   . Hypertension   . Hypothyroid   . Obstructive apnea    Split study 05-22-12 - patient was too claustrophobic and did not persue CPAP.   Marland Kitchen Osteoarthritis   . PVC's (premature ventricular contractions)   . Retinopathy    diabetic, mild, nonproliferative bilateral  . TSH (thyroid-stimulating hormone deficiency)    evaluation off RX in 4/14  . Vitamin D deficiency     Past Surgical History:  Procedure Laterality Date  . ABDOMINAL HYSTERECTOMY    . AMPUTATION Left 09/21/2020   Procedure: AMPUTATION LEFT 4TH TOE;  Surgeon: Felipa Furnace, DPM;  Location: [place];  Service: Podiatry;  Laterality: Left;  . APPLICATION OF A-CELL OF EXTREMITY Left 09/21/2020   Procedure: APPLICATION OF A-CELL, left foot;  Surgeon: Felipa Furnace, DPM;  Location: [place];  Service: Podiatry;  Laterality: Left;  . CATARACT EXTRACTION Bilateral 2012  . EYE SURGERY Right 06/26/11   cataract removal Rt eye  . FOOT SURGERY     Ankle fracture   . index finger surgery  2007   spider bite  . IR RADIOLOGIST EVAL & MGMT  09/14/2020  . ORIF ANKLE FRACTURE Left [id number]  . Right knee surgery  1966   torn cartilage  . TONSILLECTOMY  1962   adenoidectomy  . TUBAL LIGATION Bilateral 1986    There were no vitals filed for this visit.   Subjective Assessment - 02/12/21 1200    Subjective "Pretty good"    Pertinent History Broke L ankle about 20 yrs ago with pin and screw placement x 2. had L4-5 facet joint and epidural abscess with guided aspiration October 2021. 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 Posey Pronto. Both hands go numb (chronic since ~2015). Arthritis. T1DM on insulin with peripheral neuropathy of hands and feet.    Currently in Pain? Yes    Pain Score 1     Pain Location Back    Pain Descriptors / Indicators Sore                             [facility] Adult PT Treatment/Exercise - 02/12/21 0001      High Level Balance   High Level Balance Activities Side stepping    High Level Balance Comments  firm surface, close supervision-CGA      Lumbar Exercises: Standing   Row Theraband;Both;Strengthening;15 reps   x2   Theraband Level (Row) Level 3 (Green)    Shoulder Extension 15 reps;Theraband;Both   x2   Theraband Level (Shoulder Extension) Level 3 (Green)      Lumbar Exercises: Seated   Other Seated Lumbar Exercises seated iso abs with pball x15 3 sec hold      Knee/Hip Exercises: Aerobic   Nustep L5 x 6 min      Knee/Hip Exercises: Standing   Other Standing Knee Exercises Standing marches 2x10      Knee/Hip Exercises: Seated   Sit to Sand 2 sets;without UE support;5 reps                    PT Short Term Goals - 01/29/21 1525      PT SHORT TERM GOAL #1   Title Independent with HEP    Time 2    Period Weeks    Status New    Target Date 02/12/21             PT Long Term Goals - 01/29/21 1525      PT LONG TERM GOAL #1   Title Pt will demo improved berg to at least 45/56  to demo improved balance    Baseline initial 30/56    Time 8    Period Weeks    Status New    Target Date 03/26/21      PT LONG TERM GOAL #2   Title Pt will complete 5TSTS in no greater than 12 seconds to demo improved functional strength and balance    Time 8    Period Weeks    Status New    Target Date 03/26/21      PT LONG TERM GOAL #3   Title Pt demo normalized gait pattern with minimal antalgic limp and increased symmetry of gait without LOB to facilitate safe negotiation of home an dcommunity environments.    Time 8    Period Weeks    Status New    Target Date 03/26/21      PT LONG TERM GOAL #4   Title BLE strength at least 4+/5    Baseline --    Time 8    Period Weeks    Status New    Target Date 03/27/21                 Plan - 02/12/21 1230    Clinical Impression Statement Pt ~ 15 minutes late for today's session. Increase fatigue noted with warm up. Close CGA needed with standing eyes closed. Some LOB noted with side step but pt was able to correct. Postural cues needed with standing rows and extensions.    Personal Factors and Comorbidities Comorbidity 3+;Past/Current Experience;Time since onset of injury/illness/exacerbation    Examination-Activity Limitations Locomotion Level;Transfers;Stairs;Squat    Stability/Clinical Decision Making Evolving/Moderate complexity    Rehab Potential Good    PT Frequency 2x / week    PT Duration 8 weeks    PT Treatment/Interventions ADLs/Self Care Home Management;Cryotherapy;Iontophoresis 4mg /ml Dexamethasone;Moist Heat;Gait training;Neuromuscular re-education;Balance training;Therapeutic exercise;Electrical Stimulation;Therapeutic activities;Functional mobility training;Stair training;Patient/family education;Manual techniques;Energy conservation;Taping;Vasopneumatic Device;Vestibular    PT Next Visit Plan Further assess OR next visit. Keep in mind neuropathy - limited functional grip strength with some numbness in hands.  progressive strength and balance training as tolerated. manual and modalities as needed.  Patient will benefit from skilled therapeutic intervention in order to improve the following deficits and impairments:  Abnormal gait,Difficulty walking,Increased muscle spasms,Decreased endurance,Decreased activity tolerance,Pain,Improper body mechanics,Impaired flexibility,Decreased balance,Decreased mobility,Decreased strength,Increased edema,Impaired sensation,Hypomobility  Visit Diagnosis: History of falling  Muscle weakness (generalized)  Unsteadiness on feet     Problem List Patient Active Problem List   Diagnosis Date Noted  . S/P amputation of foot, right ([place])   . Diabetic foot ulcer ([place]) 09/15/2020  . Ankle pain   . Epidural abscess   . Abscess in epidural space of lumbar spine 09/12/2020  . Obstructive apnea   . Sleep disturbance 08/17/2012  . History of mammogram 07/29/2012  . Chest pain 04/06/2012  . EDEMA 05/16/2008  . Morbid obesity ([place]) 04/13/2008  . CARPAL TUNNEL SYNDROME, BILATERAL 01/15/2008  . ALLERGIC RHINITIS, SEASONAL 01/06/2008  . ANXIETY DISORDER, GENERALIZED 05/01/2007  . PERIPHERAL NEUROPATHY 05/01/2007  . ENDOMETRIAL POLYP 05/01/2007  . HYPERPLASIA, ENDOMETRIAL NOS 05/01/2007  . Type 2 diabetes mellitus with hyperglycemia ([place]) 01/29/2007  . HYPERLIPIDEMIA 01/29/2007  . ANEMIA, OTHER, UNSPECIFIED 01/29/2007  . HYPERTENSION, BENIGN SYSTEMIC 01/29/2007    Scot Jun, PTA 02/12/2021, 12:33 PM  [place]. [place], [place], [address] Phone: [phone]   Fax:  [phone]  Name: Brittany Silva MRN: [id number] Date of Birth: Jun 13, 1956

## 2021-02-14 ENCOUNTER — Encounter: Payer: Self-pay | Admitting: Physical Therapy

## 2021-02-14 ENCOUNTER — Other Ambulatory Visit: Payer: Self-pay

## 2021-02-14 ENCOUNTER — Ambulatory Visit: Payer: Medicare Other | Admitting: Physical Therapy

## 2021-02-14 DIAGNOSIS — M6281 Muscle weakness (generalized): Secondary | ICD-10-CM

## 2021-02-14 DIAGNOSIS — R2689 Other abnormalities of gait and mobility: Secondary | ICD-10-CM

## 2021-02-14 DIAGNOSIS — R2681 Unsteadiness on feet: Secondary | ICD-10-CM

## 2021-02-14 DIAGNOSIS — Z9181 History of falling: Secondary | ICD-10-CM

## 2021-02-14 NOTE — Therapy (Signed)
[place]. [place], [place], [address] Phone: [phone]   Fax:  [phone]  Physical Therapy Treatment  Patient Details  Name: Brittany Silva MRN: [id number] Date of Birth: 1956-01-17 Referring Provider (PT): Dawley, Theodoro Doing, DO neurology   Encounter Date: 02/14/2021   PT End of Session - 02/14/21 1133    Visit Number 5    Number of Visits 17    Date for PT Re-Evaluation 03/26/21    Authorization Type medicare and UHC    PT Start Time 1100    PT Stop Time 1133    PT Time Calculation (min) 33 min    Activity Tolerance Patient limited by fatigue    Behavior During Therapy [facility] for tasks assessed/performed           Past Medical History:  Diagnosis Date  . Ankle fracture    left  . Anxiety    chronic  . Cervical back pain with evidence of disc disease   . Diabetes mellitus   . Dyslipidemia   . Edema    lower extremity  . GERD (gastroesophageal reflux disease)   . Hyperlipidemia   . Hypertension   . Hypothyroid   . Obstructive apnea    Split study 05-22-12 - patient was too claustrophobic and did not persue CPAP.   Marland Kitchen Osteoarthritis   . PVC's (premature ventricular contractions)   . Retinopathy    diabetic, mild, nonproliferative bilateral  . TSH (thyroid-stimulating hormone deficiency)    evaluation off RX in 4/14  . Vitamin D deficiency     Past Surgical History:  Procedure Laterality Date  . ABDOMINAL HYSTERECTOMY    . AMPUTATION Left 09/21/2020   Procedure: AMPUTATION LEFT 4TH TOE;  Surgeon: Felipa Furnace, DPM;  Location: [place];  Service: Podiatry;  Laterality: Left;  . APPLICATION OF A-CELL OF EXTREMITY Left 09/21/2020   Procedure: APPLICATION OF A-CELL, left foot;  Surgeon: Felipa Furnace, DPM;  Location: [place];  Service: Podiatry;  Laterality: Left;  . CATARACT EXTRACTION Bilateral 2012  . EYE SURGERY Right 06/26/11   cataract removal Rt eye  . FOOT SURGERY     Ankle fracture  .  index finger surgery  2007   spider bite  . IR RADIOLOGIST EVAL & MGMT  09/14/2020  . ORIF ANKLE FRACTURE Left [id number]  . Right knee surgery  1966   torn cartilage  . TONSILLECTOMY  1962   adenoidectomy  . TUBAL LIGATION Bilateral 1986    There were no vitals filed for this visit.   Subjective Assessment - 02/14/21 1102    Subjective Feeling ok, been up since 5    Pertinent History Broke L ankle about 20 yrs ago with pin and screw placement x 2. had L4-5 facet joint and epidural abscess with guided aspiration October 2021. 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 Posey Pronto. Both hands go numb (chronic since ~2015). Arthritis. T1DM on insulin with peripheral neuropathy of hands and feet.    Currently in Pain? Yes    Pain Score 1     Pain Location Back    Pain Orientation Lower                             [facility] Adult PT Treatment/Exercise - 02/14/21 0001      High Level Balance   High Level Balance Activities Backward walking    High Level  Balance Comments side stepping over wate bars, side step on balance beam, tandem walking   All in parallel bars     Knee/Hip Exercises: Machines for Strengthening   Cybex Knee Extension 5lb 2x10, SL 5lb x 10 each    Cybex Knee Flexion 20lb 2x10      Knee/Hip Exercises: Standing   Other Standing Knee Exercises Alt box taps 6 in 2x10 HHA x2      Knee/Hip Exercises: Seated   Sit to Sand 2 sets;10 reps;without UE support                    PT Short Term Goals - 01/29/21 1525      PT SHORT TERM GOAL #1   Title Independent with HEP    Time 2    Period Weeks    Status New    Target Date 02/12/21             PT Long Term Goals - 01/29/21 1525      PT LONG TERM GOAL #1   Title Pt will demo improved berg to at least 45/56 to demo improved balance    Baseline initial 30/56    Time 8    Period Weeks    Status New    Target Date 03/26/21      PT LONG TERM GOAL #2   Title Pt will complete 5TSTS in no  greater than 12 seconds to demo improved functional strength and balance    Time 8    Period Weeks    Status New    Target Date 03/26/21      PT LONG TERM GOAL #3   Title Pt demo normalized gait pattern with minimal antalgic limp and increased symmetry of gait without LOB to facilitate safe negotiation of home an dcommunity environments.    Time 8    Period Weeks    Status New    Target Date 03/26/21      PT LONG TERM GOAL #4   Title BLE strength at least 4+/5    Baseline --    Time 8    Period Weeks    Status New    Target Date 03/27/21                 Plan - 02/14/21 1133    Clinical Impression Statement Pt elected a shorten treatment time due to fatigue. She was able to side step over wate bar without UE assist. UE assist needed for all other interventions performed in parallel bars. Cues to increase step length with backwards walking. SL on leg extensions performed because she was unable to use LE equally together.    Personal Factors and Comorbidities Comorbidity 3+;Past/Current Experience;Time since onset of injury/illness/exacerbation    Examination-Activity Limitations Locomotion Level;Transfers;Stairs;Squat    Stability/Clinical Decision Making Evolving/Moderate complexity    Rehab Potential Good    PT Frequency 2x / week    PT Duration 8 weeks    PT Treatment/Interventions ADLs/Self Care Home Management;Cryotherapy;Iontophoresis 4mg /ml Dexamethasone;Moist Heat;Gait training;Neuromuscular re-education;Balance training;Therapeutic exercise;Electrical Stimulation;Therapeutic activities;Functional mobility training;Stair training;Patient/family education;Manual techniques;Energy conservation;Taping;Vasopneumatic Device;Vestibular    PT Next Visit Plan Further assess OR next visit. Keep in mind neuropathy - limited functional grip strength with some numbness in hands. progressive strength and balance training as tolerated. manual and modalities as needed.            Patient will benefit from skilled therapeutic intervention in order to improve the following deficits and impairments:  Abnormal gait,Difficulty walking,Increased muscle spasms,Decreased endurance,Decreased activity tolerance,Pain,Improper body mechanics,Impaired flexibility,Decreased balance,Decreased mobility,Decreased strength,Increased edema,Impaired sensation,Hypomobility  Visit Diagnosis: History of falling  Muscle weakness (generalized)  Other abnormalities of gait and mobility  Unsteadiness on feet     Problem List Patient Active Problem List   Diagnosis Date Noted  . S/P amputation of foot, right ([place])   . Diabetic foot ulcer ([place]) 09/15/2020  . Ankle pain   . Epidural abscess   . Abscess in epidural space of lumbar spine 09/12/2020  . Obstructive apnea   . Sleep disturbance 08/17/2012  . History of mammogram 07/29/2012  . Chest pain 04/06/2012  . EDEMA 05/16/2008  . Morbid obesity ([place]) 04/13/2008  . CARPAL TUNNEL SYNDROME, BILATERAL 01/15/2008  . ALLERGIC RHINITIS, SEASONAL 01/06/2008  . ANXIETY DISORDER, GENERALIZED 05/01/2007  . PERIPHERAL NEUROPATHY 05/01/2007  . ENDOMETRIAL POLYP 05/01/2007  . HYPERPLASIA, ENDOMETRIAL NOS 05/01/2007  . Type 2 diabetes mellitus with hyperglycemia ([place]) 01/29/2007  . HYPERLIPIDEMIA 01/29/2007  . ANEMIA, OTHER, UNSPECIFIED 01/29/2007  . HYPERTENSION, BENIGN SYSTEMIC 01/29/2007    Scot Jun 02/14/2021, 11:37 AM  [place]. [place], [place], [address] Phone: [phone]   Fax:  [phone]  Name: Brittany Silva MRN: [id number] Date of Birth: [birth year]

## 2021-02-16 ENCOUNTER — Ambulatory Visit (INDEPENDENT_AMBULATORY_CARE_PROVIDER_SITE_OTHER): Payer: Medicare Other | Admitting: Infectious Disease

## 2021-02-16 ENCOUNTER — Encounter: Payer: Self-pay | Admitting: Infectious Disease

## 2021-02-16 ENCOUNTER — Other Ambulatory Visit: Payer: Self-pay

## 2021-02-16 VITALS — BP 141/83 | HR 72 | Temp 98.3°F | Ht 67.5 in | Wt 266.0 lb

## 2021-02-16 DIAGNOSIS — G629 Polyneuropathy, unspecified: Secondary | ICD-10-CM

## 2021-02-16 DIAGNOSIS — Z89431 Acquired absence of right foot: Secondary | ICD-10-CM

## 2021-02-16 DIAGNOSIS — S92415A Nondisplaced fracture of proximal phalanx of left great toe, initial encounter for closed fracture: Secondary | ICD-10-CM

## 2021-02-16 DIAGNOSIS — G061 Intraspinal abscess and granuloma: Secondary | ICD-10-CM

## 2021-02-16 NOTE — Progress Notes (Signed)
Subjective:  Complaint low back pain  Patient ID: Brittany Silva, female    DOB: Apr 14, 1956, 65 y.o.   MRN: [id number]  Chief complaints: Continued low back pain and some numbness when she walks. HPI   65 y.o. female with  L4-5 facet joint and epidural abscess. Status post IR guided aspiration with Strep mitis/oralis S to PCN and now on high dose penicillin. 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 Posey Pronto.  She pleated IV penicillin and then was switched to amoxicillin  Her amputation site the foot has healed up well.   She completed her antibiotics and has been off of them since then.  Pain has not worsened.  She still does have some pain that she rates at times a 2 out of 10 in severity she also suffers from chronic neuropathy.  She believes she may have broken a toe on her foot and her left foot does have some discoloration.  She did not want to have x-rays done for the evaluation of this.  She says her back pain is worse now that she has been working with physical therapy but typically resolves with rest.  Past Medical History:  Diagnosis Date  . Ankle fracture    left  . Anxiety    chronic  . Cervical back pain with evidence of disc disease   . Diabetes mellitus   . Dyslipidemia   . Edema    lower extremity  . GERD (gastroesophageal reflux disease)   . Hyperlipidemia   . Hypertension   . Hypothyroid   . Obstructive apnea    Split study 05-22-12 - patient was too claustrophobic and did not persue CPAP.   Marland Kitchen Osteoarthritis   . PVC's (premature ventricular contractions)   . Retinopathy    diabetic, mild, nonproliferative bilateral  . TSH (thyroid-stimulating hormone deficiency)    evaluation off RX in 4/14  . Vitamin D deficiency     Past Surgical History:  Procedure Laterality Date  . ABDOMINAL HYSTERECTOMY    . AMPUTATION Left 09/21/2020   Procedure: AMPUTATION LEFT 4TH TOE;  Surgeon: Felipa Furnace, DPM;  Location: [facility];  Service: Podiatry;   Laterality: Left;  . APPLICATION OF A-CELL OF EXTREMITY Left 09/21/2020   Procedure: APPLICATION OF A-CELL, left foot;  Surgeon: Felipa Furnace, DPM;  Location: [facility];  Service: Podiatry;  Laterality: Left;  . CATARACT EXTRACTION Bilateral 2012  . EYE SURGERY Right 06/26/11   cataract removal Rt eye  . FOOT SURGERY     Ankle fracture  . index finger surgery  2007   spider bite  . IR RADIOLOGIST EVAL & MGMT  09/14/2020  . ORIF ANKLE FRACTURE Left 4098,1191  . Right knee surgery  1966   torn cartilage  . TONSILLECTOMY  1962   adenoidectomy  . TUBAL LIGATION Bilateral 1986    Family History  Problem Relation Age of Onset  . Coronary artery disease Father        MI at age 66  . Hypertension Mother   . Thyroid disease Mother   . Ulcers Sister   . Allergies Sister   . Psoriasis Sister   . Diabetes Maternal Grandfather   . Heart attack Paternal Grandfather   . Colon cancer Neg Hx       Social History   Socioeconomic History  . Marital status: Married    Spouse name: Not on file  . Number of children: 2  . Years of education: 59  .  Highest education level: Not on file  Occupational History    Employer: UNEMPLOYED    Comment: [employer]  Tobacco Use  . Smoking status: Never Smoker  . Smokeless tobacco: Never Used  Vaping Use  . Vaping Use: Never used  Substance and Sexual Activity  . Alcohol use: No  . Drug use: No  . Sexual activity: Not on file  Other Topics Concern  . Not on file  Social History Narrative  . Not on file   Social Determinants of Health   Financial Resource Strain: Not on file  Food Insecurity: Not on file  Transportation Needs: Not on file  Physical Activity: Not on file  Stress: Not on file  Social Connections: Not on file    Allergies  Allergen Reactions  . Hydrochlorothiazide     rash  . Lisinopril     Other reaction(s): Cough  . Other Other (See Comments)  . Adhesive [Tape]   . Codeine Phosphate     REACTION: throat  swelling  . Darvon Other (See Comments)    Jittering, skin problems  . Darvon [Propoxyphene Hcl]     Hyperactivity; "makes me crazy"  . Hydrocodone-Acetaminophen Itching    *Has tolerated hydromorphone as an adult* Per pt, when she was a child had throat itching, difficulty breathing and feel strange and also vomit   . Propoxyphene Hcl     REACTION: Makes her crazy  . Latex Rash  . Neosporin [Neomycin-Bacitracin Zn-Polymyx] Rash  . Sulfamethoxazole Itching and Rash     Current Outpatient Medications:  .  acetaminophen (TYLENOL) 500 MG tablet, Take 500-2,000 mg by mouth as needed for moderate pain. , Disp: , Rfl:  .  amLODipine (NORVASC) 5 MG tablet, Take 1 tablet (5 mg total) by mouth daily., Disp: 90 tablet, Rfl: 3 .  atorvastatin (LIPITOR) 40 MG tablet, Take 40 mg by mouth daily., Disp: , Rfl:  .  B-D ULTRAFINE III SHORT PEN 31G X 8 MM MISC, Inject into the skin as directed., Disp: , Rfl:  .  BD INSULIN SYRINGE U/F 31G X 5/16" 1 ML MISC, See admin instructions. with insulin, Disp: , Rfl:  .  cyclobenzaprine (FLEXERIL) 5 MG tablet, Take 5 mg by mouth 3 (three) times daily as needed., Disp: , Rfl:  .  DULoxetine (CYMBALTA) 30 MG capsule, Take 90 mg by mouth daily., Disp: , Rfl:  .  furosemide (LASIX) 20 MG tablet, Take 40 mg by mouth daily. , Disp: , Rfl:  .  HUMALOG KWIKPEN 100 UNIT/ML KwikPen, Inject 15-25 Units into the skin in the morning, at noon, and at bedtime., Disp: , Rfl:  .  LEVEMIR 100 UNIT/ML injection, Inject 75 Units into the skin in the morning and at bedtime., Disp: , Rfl:  .  losartan (COZAAR) 100 MG tablet, , Disp: , Rfl:  .  meloxicam (MOBIC) 15 MG tablet, Take 15 mg by mouth daily., Disp: , Rfl:  .  NOVOFINE PEN NEEDLE 32G X 6 MM MISC, Inject into the skin daily., Disp: , Rfl:  .  ondansetron (ZOFRAN ODT) 4 MG disintegrating tablet, Take 1 tablet (4 mg total) by mouth every 8 (eight) hours as needed for nausea or vomiting., Disp: 15 tablet, Rfl: 0 .  oxyCODONE  (ROXICODONE) 5 MG immediate release tablet, Take 1 tablet (5 mg total) by mouth every 8 (eight) hours as needed., Disp: 10 tablet, Rfl: 0 .  potassium chloride (KLOR-CON) 8 MEQ tablet, Take 8 mEq by mouth 2 (two) times daily  as needed (low potassium). , Disp: , Rfl:  .  SYNTHROID 75 MCG tablet, Take 75 mcg by mouth every morning., Disp: , Rfl:  .  traMADol (ULTRAM) 50 MG tablet, Take 1 tablet (50 mg total) by mouth every 8 (eight) hours., Disp: 15 tablet, Rfl: 0    Review of Systems  Constitutional: Negative for activity change, appetite change, chills, diaphoresis, fatigue, fever and unexpected weight change.  HENT: Negative for congestion, rhinorrhea, sinus pressure, sneezing, sore throat and trouble swallowing.   Eyes: Negative for photophobia, redness and visual disturbance.  Respiratory: Negative for cough, chest tightness, shortness of breath, wheezing and stridor.   Cardiovascular: Negative for chest pain, palpitations and leg swelling.  Gastrointestinal: Negative for abdominal distention, abdominal pain, anal bleeding, blood in stool, diarrhea, nausea and vomiting.  Genitourinary: Negative for dysuria, flank pain and hematuria.  Musculoskeletal: Positive for back pain. Negative for arthralgias, gait problem, joint swelling and myalgias.  Skin: Negative for color change, pallor, rash and wound.  Neurological: Negative for dizziness, tremors, weakness and light-headedness.  Hematological: Negative for adenopathy. Does not bruise/bleed easily.  Psychiatric/Behavioral: Negative for agitation, behavioral problems, confusion, decreased concentration, dysphoric mood and sleep disturbance.       Objective:   Physical Exam Constitutional:      General: She is not in acute distress.    Appearance: Normal appearance. She is well-developed. She is not ill-appearing or diaphoretic.  HENT:     Head: Normocephalic and atraumatic.     Right Ear: Hearing and external ear normal.     Left Ear:  Hearing and external ear normal.     Nose: No nasal deformity or rhinorrhea.  Eyes:     General: No scleral icterus.    Conjunctiva/sclera: Conjunctivae normal.     Right eye: Right conjunctiva is not injected.     Left eye: Left conjunctiva is not injected.     Pupils: Pupils are equal, round, and reactive to light.  Neck:     Vascular: No JVD.  Cardiovascular:     Rate and Rhythm: Normal rate and regular rhythm.     Heart sounds: S1 normal and S2 normal.  Pulmonary:     Effort: Pulmonary effort is normal. No respiratory distress.     Breath sounds: No wheezing.  Abdominal:     General: There is no distension.     Palpations: Abdomen is soft.  Musculoskeletal:        General: Normal range of motion.     Right shoulder: Normal.     Left shoulder: Normal.     Cervical back: Normal range of motion and neck supple.     Right hip: Normal.     Left hip: Normal.     Right knee: Normal.     Left knee: Normal.  Lymphadenopathy:     Head:     Right side of head: No submandibular, preauricular or posterior auricular adenopathy.     Left side of head: No submandibular, preauricular or posterior auricular adenopathy.     Cervical: No cervical adenopathy.     Right cervical: No superficial or deep cervical adenopathy.    Left cervical: No superficial or deep cervical adenopathy.  Skin:    General: Skin is warm and dry.     Coloration: Skin is not pale.     Findings: No abrasion, bruising, ecchymosis, erythema, lesion or rash.     Nails: There is no clubbing.  Neurological:     General: No focal  deficit present.     Mental Status: She is alert and oriented to person, place, and time.     Sensory: No sensory deficit.     Coordination: Coordination normal.     Gait: Gait normal.  Psychiatric:        Attention and Perception: Attention normal. She is attentive.        Mood and Affect: Mood normal.        Speech: Speech normal.        Behavior: Behavior normal. Behavior is  cooperative.        Thought Content: Thought content normal.        Cognition and Memory: Cognition and memory normal.        Judgment: Judgment normal.     Amputation site 10/23/2020:     Amputation site December 14, 2020:      Amputation site was cleaned today.  Her large toe did have some discoloration around it and some tenderness. 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    She finished her amoxicillin.  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  Neuropathy: Trying to get better shoes to protect her feet.

## 2021-02-17 LAB — CBC WITH DIFFERENTIAL/PLATELET
Absolute Monocytes: 419 cells/uL (ref 200–950)
Basophils Absolute: 28 cells/uL (ref 0–200)
Basophils Relative: 0.4 %
Eosinophils Absolute: 320 cells/uL (ref 15–500)
Eosinophils Relative: 4.5 %
HCT: 39.4 % (ref 35.0–45.0)
Hemoglobin: 13.3 g/dL (ref 11.7–15.5)
Lymphs Abs: 802 cells/uL — ABNORMAL LOW (ref 850–3900)
MCH: 29.5 pg (ref 27.0–33.0)
MCHC: 33.8 g/dL (ref 32.0–36.0)
MCV: 87.4 fL (ref 80.0–100.0)
MPV: 9 fL (ref 7.5–12.5)
Monocytes Relative: 5.9 %
Neutro Abs: 5531 cells/uL (ref 1500–7800)
Neutrophils Relative %: 77.9 %
Platelets: 208 10*3/uL (ref 140–400)
RBC: 4.51 10*6/uL (ref 3.80–5.10)
RDW: 13.5 % (ref 11.0–15.0)
Total Lymphocyte: 11.3 %
WBC: 7.1 10*3/uL (ref 3.8–10.8)

## 2021-02-17 LAB — SEDIMENTATION RATE: Sed Rate: 6 mm/h (ref 0–30)

## 2021-02-17 LAB — BASIC METABOLIC PANEL WITH GFR
BUN: 20 mg/dL (ref 7–25)
CO2: 27 mmol/L (ref 20–32)
Calcium: 8.8 mg/dL (ref 8.6–10.4)
Chloride: 103 mmol/L (ref 98–110)
Creat: 0.69 mg/dL (ref 0.50–0.99)
GFR, Est African American: 107 mL/min/{1.73_m2} (ref >=60)
GFR, Est Non African American: 92 mL/min/{1.73_m2} (ref >=60)
Glucose, Bld: 232 mg/dL — ABNORMAL HIGH (ref 65–99)
Potassium: 4.2 mmol/L (ref 3.5–5.3)
Sodium: 140 mmol/L (ref 135–146)

## 2021-02-17 LAB — C-REACTIVE PROTEIN: CRP: 7.1 mg/L (ref <8.0)

## 2021-02-19 ENCOUNTER — Encounter: Payer: Self-pay | Admitting: Physical Therapy

## 2021-02-19 ENCOUNTER — Telehealth: Payer: Self-pay | Admitting: Podiatry

## 2021-02-19 ENCOUNTER — Other Ambulatory Visit: Payer: Self-pay

## 2021-02-19 ENCOUNTER — Ambulatory Visit: Payer: Medicare Other | Admitting: Physical Therapy

## 2021-02-19 DIAGNOSIS — R2689 Other abnormalities of gait and mobility: Secondary | ICD-10-CM

## 2021-02-19 DIAGNOSIS — Z9181 History of falling: Secondary | ICD-10-CM

## 2021-02-19 DIAGNOSIS — M6281 Muscle weakness (generalized): Secondary | ICD-10-CM

## 2021-02-19 DIAGNOSIS — R2681 Unsteadiness on feet: Secondary | ICD-10-CM

## 2021-02-19 NOTE — Therapy (Signed)
[address] [place], [place], [address] Phone: [phone]   Fax:  [phone]  Physical Therapy Treatment  Patient Details  Name: Brittany Silva MRN: [id number] Date of Birth: Feb 07, 1956 Referring Provider (PT): Dawley, Theodoro Doing, DO neurology   Encounter Date: 02/19/2021   PT End of Session - 02/19/21 1225    Visit Number 6    Date for PT Re-Evaluation 03/26/21    Authorization Type medicare and [employer]    PT Start Time 0962    PT Stop Time 1226    PT Time Calculation (min) 41 min    Activity Tolerance Patient limited by fatigue    Behavior During Therapy [facility] for tasks assessed/performed           Past Medical History:  Diagnosis Date  . Ankle fracture    left  . Anxiety    chronic  . Cervical back pain with evidence of disc disease   . Diabetes mellitus   . Dyslipidemia   . Edema    lower extremity  . GERD (gastroesophageal reflux disease)   . Hyperlipidemia   . Hypertension   . Hypothyroid   . Obstructive apnea    Split study 05-22-12 - patient was too claustrophobic and did not persue CPAP.   Marland Kitchen Osteoarthritis   . PVC's (premature ventricular contractions)   . Retinopathy    diabetic, mild, nonproliferative bilateral  . TSH (thyroid-stimulating hormone deficiency)    evaluation off RX in 4/14  . Vitamin D deficiency     Past Surgical History:  Procedure Laterality Date  . ABDOMINAL HYSTERECTOMY    . AMPUTATION Left 09/21/2020   Procedure: AMPUTATION LEFT 4TH TOE;  Surgeon: Felipa Furnace, DPM;  Location: [facility];  Service: Podiatry;  Laterality: Left;  . APPLICATION OF A-CELL OF EXTREMITY Left 09/21/2020   Procedure: APPLICATION OF A-CELL, left foot;  Surgeon: Felipa Furnace, DPM;  Location: [facility];  Service: Podiatry;  Laterality: Left;  . CATARACT EXTRACTION Bilateral 2012  . EYE SURGERY Right 06/26/11   cataract removal Rt eye  . FOOT SURGERY     Ankle fracture  . index finger surgery  2007    spider bite  . IR RADIOLOGIST EVAL & MGMT  09/14/2020  . ORIF ANKLE FRACTURE Left [id number]  . Right knee surgery  1966   torn cartilage  . TONSILLECTOMY  1962   adenoidectomy  . TUBAL LIGATION Bilateral 1986    There were no vitals filed for this visit.   Subjective Assessment - 02/19/21 1145    Subjective "fair to [place]"    Currently in Pain? Yes    Pain Score 3     Pain Location --   lower back and legs                            [facility] Adult PT Treatment/Exercise - 02/19/21 0001      High Level Balance   High Level Balance Activities Backward walking    High Level Balance Comments side step over bodyblade, side step on airex but very unstable; standing eyes closes 10 sec x 4      Knee/Hip Exercises: Aerobic   Nustep L5 x 6 min      Knee/Hip Exercises: Machines for Strengthening   Cybex Knee Extension 10lb x10, SL 5lb x10 each    Cybex Knee Flexion 25lb 2x10      Knee/Hip  Exercises: Standing   Other Standing Knee Exercises 40lb forward/back x3, 20lb side step x2                    PT Short Term Goals - 01/29/21 1525      PT SHORT TERM GOAL #1   Title Independent with HEP    Time 2    Period Weeks    Status New    Target Date 02/12/21             PT Long Term Goals - 01/29/21 1525      PT LONG TERM GOAL #1   Title Pt will demo improved berg to at least 45/56 to demo improved balance    Baseline initial 30/56    Time 8    Period Weeks    Status New    Target Date 03/26/21      PT LONG TERM GOAL #2   Title Pt will complete 5TSTS in no greater than 12 seconds to demo improved functional strength and balance    Time 8    Period Weeks    Status New    Target Date 03/26/21      PT LONG TERM GOAL #3   Title Pt demo normalized gait pattern with minimal antalgic limp and increased symmetry of gait without LOB to facilitate safe negotiation of home an dcommunity environments.    Time 8    Period Weeks    Status New     Target Date 03/26/21      PT LONG TERM GOAL #4   Title BLE strength at least 4+/5    Baseline --    Time 8    Period Weeks    Status New    Target Date 03/27/21                 Plan - 02/19/21 1225    Clinical Impression Statement Pt did fair with today's interventions. Instability noted with resisted gait in all directions. Attempted side step and standing eyes closed on airex She was very unstable on airex surfaces requiring them to discontinued. LLE is weakness than R noted with leg extensions.    Personal Factors and Comorbidities Comorbidity 3+;Past/Current Experience;Time since onset of injury/illness/exacerbation    Examination-Activity Limitations Locomotion Level;Transfers;Stairs;Squat    Stability/Clinical Decision Making Evolving/Moderate complexity    Rehab Potential Good    PT Frequency 2x / week    PT Duration 8 weeks    PT Next Visit Plan Keep in mind neuropathy - limited functional grip strength with some numbness in hands. progressive strength and balance training as tolerated. manual and modalities as needed.           Patient will benefit from skilled therapeutic intervention in order to improve the following deficits and impairments:  Abnormal gait,Difficulty walking,Increased muscle spasms,Decreased endurance,Decreased activity tolerance,Pain,Improper body mechanics,Impaired flexibility,Decreased balance,Decreased mobility,Decreased strength,Increased edema,Impaired sensation,Hypomobility  Visit Diagnosis: Muscle weakness (generalized)  Other abnormalities of gait and mobility  Unsteadiness on feet  History of falling     Problem List Patient Active Problem List   Diagnosis Date Noted  . S/P amputation of foot, right ([place])   . Diabetic foot ulcer ([place]) 09/15/2020  . Ankle pain   . Epidural abscess   . Abscess in epidural space of lumbar spine 09/12/2020  . Obstructive apnea   . Sleep disturbance 08/17/2012  . History of mammogram  07/29/2012  . Chest pain 04/06/2012  . EDEMA 05/16/2008  .  Morbid obesity ([place]) 04/13/2008  . CARPAL TUNNEL SYNDROME, BILATERAL 01/15/2008  . ALLERGIC RHINITIS, SEASONAL 01/06/2008  . ANXIETY DISORDER, GENERALIZED 05/01/2007  . PERIPHERAL NEUROPATHY 05/01/2007  . ENDOMETRIAL POLYP 05/01/2007  . HYPERPLASIA, ENDOMETRIAL NOS 05/01/2007  . Type 2 diabetes mellitus with hyperglycemia ([place]) 01/29/2007  . HYPERLIPIDEMIA 01/29/2007  . ANEMIA, OTHER, UNSPECIFIED 01/29/2007  . HYPERTENSION, BENIGN SYSTEMIC 01/29/2007    Scot Jun 02/19/2021, 12:29 PM  [address]. [place], [place], [address] Phone: [phone]   Fax:  [phone]  Name: HERMELA HARDT MRN: [id number] Date of Birth: 1956-10-23

## 2021-02-19 NOTE — Telephone Encounter (Signed)
Pt left message stating she picked up diabetic shoes and they are not working, they are slipping on the heel and not comfortable. Her feet are swelling and they do not fit with the swelling.  I returned call and scheduled pt to see [facility] tomorrow @ 900

## 2021-02-20 ENCOUNTER — Ambulatory Visit (INDEPENDENT_AMBULATORY_CARE_PROVIDER_SITE_OTHER): Payer: Medicare Other | Admitting: *Deleted

## 2021-02-20 DIAGNOSIS — Z89431 Acquired absence of right foot: Secondary | ICD-10-CM

## 2021-02-20 DIAGNOSIS — S98132A Complete traumatic amputation of one left lesser toe, initial encounter: Secondary | ICD-10-CM

## 2021-02-20 DIAGNOSIS — L97529 Non-pressure chronic ulcer of other part of left foot with unspecified severity: Secondary | ICD-10-CM

## 2021-02-20 DIAGNOSIS — E11621 Type 2 diabetes mellitus with foot ulcer: Secondary | ICD-10-CM

## 2021-02-20 NOTE — Progress Notes (Signed)
Patient presents to the office today for problems with her diabetic shoes.  Patient states her heel keeps slipping out the the shoe when she walks and having a hard time even getting her foot inside the shoe to wear it.  She looked at multiple shoes in the catalog as well as tried on various sizes and styles in the office.   She became incoherent and unable to really communicate her wants and needs from the shoe. I asked if she had eaten and checked her blood sugar this morning and she had done neither. I gave her 2 sugar tabs and an 8oz apple juice. Attempted to check glucose, but was unsuccessful.   She sat for about 20 minutes and had another piece of candy from her purse, as she was starting to feel a little better. Barbaraann Rondo, our office administrator walked her out to her car. Her husband did come with her and was waiting outside.  We will send the original order back, which was the [employer] 672, men's 8.5 x-wide.  Patient shoe selection and reorder-   1st choice:   [employer] 813 Men's 8.5 4E  2nd choice:  [employer] 470 Women's 10 XX-wide  Patient will be notified on arrival and reappoint for fitting at that time.

## 2021-02-22 ENCOUNTER — Ambulatory Visit: Payer: Medicare Other | Admitting: Physical Therapy

## 2021-02-27 ENCOUNTER — Encounter: Payer: Self-pay | Admitting: Physical Therapy

## 2021-02-27 ENCOUNTER — Ambulatory Visit: Payer: Medicare Other | Admitting: Physical Therapy

## 2021-02-27 ENCOUNTER — Other Ambulatory Visit: Payer: Self-pay

## 2021-02-27 DIAGNOSIS — R2681 Unsteadiness on feet: Secondary | ICD-10-CM

## 2021-02-27 DIAGNOSIS — M6281 Muscle weakness (generalized): Secondary | ICD-10-CM

## 2021-02-27 DIAGNOSIS — Z9181 History of falling: Secondary | ICD-10-CM

## 2021-02-27 DIAGNOSIS — R2689 Other abnormalities of gait and mobility: Secondary | ICD-10-CM

## 2021-02-27 NOTE — Therapy (Signed)
[place]. [place], [place], [address] Phone: [phone]   Fax:  [phone]  Physical Therapy Treatment  Patient Details  Name: Brittany Silva MRN: [id number] Date of Birth: [birth year] Referring Provider (PT): Dawley, Theodoro Doing, DO neurology   Encounter Date: 02/27/2021   PT End of Session - 02/27/21 1224    Visit Number 7    Number of Visits 17    Date for PT Re-Evaluation 03/26/21    Authorization Type medicare and UHC    PT Start Time 2707    PT Stop Time 1225    PT Time Calculation (min) 40 min    Activity Tolerance Patient limited by fatigue;Patient tolerated treatment well    Behavior During Therapy [facility] for tasks assessed/performed           Past Medical History:  Diagnosis Date  . Ankle fracture    left  . Anxiety    chronic  . Cervical back pain with evidence of disc disease   . Diabetes mellitus   . Dyslipidemia   . Edema    lower extremity  . GERD (gastroesophageal reflux disease)   . Hyperlipidemia   . Hypertension   . Hypothyroid   . Obstructive apnea    Split study 05-22-12 - patient was too claustrophobic and did not persue CPAP.   Marland Kitchen Osteoarthritis   . PVC's (premature ventricular contractions)   . Retinopathy    diabetic, mild, nonproliferative bilateral  . TSH (thyroid-stimulating hormone deficiency)    evaluation off RX in 4/14  . Vitamin D deficiency     Past Surgical History:  Procedure Laterality Date  . ABDOMINAL HYSTERECTOMY    . AMPUTATION Left 09/21/2020   Procedure: AMPUTATION LEFT 4TH TOE;  Surgeon: Felipa Furnace, DPM;  Location: [place];  Service: Podiatry;  Laterality: Left;  . APPLICATION OF A-CELL OF EXTREMITY Left 09/21/2020   Procedure: APPLICATION OF A-CELL, left foot;  Surgeon: Felipa Furnace, DPM;  Location: [place];  Service: Podiatry;  Laterality: Left;  . CATARACT EXTRACTION Bilateral 2012  . EYE SURGERY Right 06/26/11   cataract removal Rt eye  . FOOT  SURGERY     Ankle fracture  . index finger surgery  2007   spider bite  . IR RADIOLOGIST EVAL & MGMT  09/14/2020  . ORIF ANKLE FRACTURE Left 8675,4492  . Right knee surgery  1966   torn cartilage  . TONSILLECTOMY  1962   adenoidectomy  . TUBAL LIGATION Bilateral 1986    There were no vitals filed for this visit.   Subjective Assessment - 02/27/21 1147    Subjective Feeling ok    Currently in Pain? Yes    Pain Location Back    Pain Orientation Left;Mid              [facility] PT Assessment - 02/27/21 0001      Berg Balance Test   Sit to Stand Able to stand without using hands and stabilize independently    Standing Unsupported Able to stand safely 2 minutes    Sitting with Back Unsupported but Feet Supported on Floor or Stool Able to sit safely and securely 2 minutes    Stand to Sit Sits safely with minimal use of hands    Transfers Able to transfer safely, minor use of hands    Standing Unsupported with Eyes Closed Able to stand 10 seconds with supervision    Standing Unsupported with Feet Together Able  to place feet together independently and stand for 1 minute with supervision    From Standing, Reach Forward with Outstretched Arm Can reach forward >12 cm safely (5")    From Standing Position, Pick up Object from [place] to pick up shoe, needs supervision    From Standing Position, Turn to Look Behind Over each Shoulder Looks behind one side only/other side shows less weight shift    Turn 360 Degrees Able to turn 360 degrees safely one side only in 4 seconds or less    Standing Unsupported, Alternately Place Feet on Step/Stool Able to complete 4 steps without aid or supervision    Standing Unsupported, One Foot in [employer] balance while stepping or standing    Standing on One Leg Tries to lift leg/unable to hold 3 seconds but remains standing independently    Total Score 41                         [facility] Adult PT Treatment/Exercise - 02/27/21 0001       Lumbar Exercises: Aerobic   UBE (Upper Arm Bike) L1 x 3 min each      Lumbar Exercises: Machines for Strengthening   Other Lumbar Machine Exercise Rows & Lats 20lb 2x10      Lumbar Exercises: Standing   Shoulder Extension 20 reps;Power Tower;Strengthening;Both    Shoulder Extension Limitations 5      Knee/Hip Exercises: Machines for Strengthening   Cybex Knee Flexion 25lb 2x10    Cybex Leg Press 20lb 2x10                    PT Short Term Goals - 01/29/21 1525      PT SHORT TERM GOAL #1   Title Independent with HEP    Time 2    Period Weeks    Status New    Target Date 02/12/21             PT Long Term Goals - 02/27/21 1203      PT LONG TERM GOAL #1   Title Pt will demo improved berg to at least 45/56 to demo improved balance    Status Partially Met   41/56     PT LONG TERM GOAL #3   Title Pt demo normalized gait pattern with minimal antalgic limp and increased symmetry of gait without LOB to facilitate safe negotiation of home an dcommunity environments.    Status Partially Met                 Plan - 02/27/21 1228    Clinical Impression Statement Pt has progressed increasing her BERG balance score. Added additional postural machine level strengthening without issues. Tactile cue to prevent posterior leaning with seated rows. No issues with the addition of leg press. Some postural weakness noted with shoulder extensions.    Personal Factors and Comorbidities Comorbidity 3+;Past/Current Experience;Time since onset of injury/illness/exacerbation    Examination-Activity Limitations Locomotion Level;Transfers;Stairs;Squat    Stability/Clinical Decision Making Evolving/Moderate complexity    Rehab Potential Good    PT Frequency 2x / week    PT Duration 8 weeks    PT Treatment/Interventions ADLs/Self Care Home Management;Cryotherapy;Iontophoresis 48m/ml Dexamethasone;Moist Heat;Gait training;Neuromuscular re-education;Balance training;Therapeutic  exercise;Electrical Stimulation;Therapeutic activities;Functional mobility training;Stair training;Patient/family education;Manual techniques;Energy conservation;Taping;Vasopneumatic Device;Vestibular    PT Next Visit Plan Keep in mind neuropathy - limited functional grip strength with some numbness in hands. progressive strength and balance training as tolerated. manual and  modalities as needed.           Patient will benefit from skilled therapeutic intervention in order to improve the following deficits and impairments:  Abnormal gait,Difficulty walking,Increased muscle spasms,Decreased endurance,Decreased activity tolerance,Pain,Improper body mechanics,Impaired flexibility,Decreased balance,Decreased mobility,Decreased strength,Increased edema,Impaired sensation,Hypomobility  Visit Diagnosis: Unsteadiness on feet  Other abnormalities of gait and mobility  Muscle weakness (generalized)  History of falling     Problem List Patient Active Problem List   Diagnosis Date Noted  . S/P amputation of foot, right ([place])   . Diabetic foot ulcer ([place]) 09/15/2020  . Ankle pain   . Epidural abscess   . Abscess in epidural space of lumbar spine 09/12/2020  . Obstructive apnea   . Sleep disturbance 08/17/2012  . History of mammogram 07/29/2012  . Chest pain 04/06/2012  . EDEMA 05/16/2008  . Morbid obesity ([place]) 04/13/2008  . CARPAL TUNNEL SYNDROME, BILATERAL 01/15/2008  . ALLERGIC RHINITIS, SEASONAL 01/06/2008  . ANXIETY DISORDER, GENERALIZED 05/01/2007  . PERIPHERAL NEUROPATHY 05/01/2007  . ENDOMETRIAL POLYP 05/01/2007  . HYPERPLASIA, ENDOMETRIAL NOS 05/01/2007  . Type 2 diabetes mellitus with hyperglycemia ([place]) 01/29/2007  . HYPERLIPIDEMIA 01/29/2007  . ANEMIA, OTHER, UNSPECIFIED 01/29/2007  . HYPERTENSION, BENIGN SYSTEMIC 01/29/2007    Scot Jun, PTA 02/27/2021, 12:30 PM  [place]. [place],  [place], [address] Phone: [phone]   Fax:  [phone]  Name: Brittany Silva MRN: [id number] Date of Birth: 21-Feb-1956

## 2021-03-01 ENCOUNTER — Ambulatory Visit: Payer: Medicare Other | Admitting: Physical Therapy

## 2021-03-01 ENCOUNTER — Other Ambulatory Visit: Payer: Self-pay

## 2021-03-01 ENCOUNTER — Encounter: Payer: Self-pay | Admitting: Physical Therapy

## 2021-03-01 DIAGNOSIS — R2689 Other abnormalities of gait and mobility: Secondary | ICD-10-CM

## 2021-03-01 DIAGNOSIS — Z9181 History of falling: Secondary | ICD-10-CM

## 2021-03-01 DIAGNOSIS — R2681 Unsteadiness on feet: Secondary | ICD-10-CM | POA: Diagnosis not present

## 2021-03-01 DIAGNOSIS — M6281 Muscle weakness (generalized): Secondary | ICD-10-CM

## 2021-03-01 NOTE — Therapy (Signed)
[place]. [place], [place], [address] Phone: [phone]   Fax:  [phone]  Physical Therapy Treatment  Patient Details  Name: Brittany Silva MRN: [id number] Date of Birth: September 15, 1956 Referring Provider (PT): Dawley, Theodoro Doing, DO neurology   Encounter Date: 03/01/2021   PT End of Session - 03/01/21 1228    Visit Number 8    Number of Visits 17    Date for PT Re-Evaluation 03/26/21    Authorization Type medicare and UHC    PT Start Time 2761    PT Stop Time 1226    PT Time Calculation (min) 42 min    Activity Tolerance Patient limited by fatigue;Patient tolerated treatment well    Behavior During Therapy [facility] for tasks assessed/performed           Past Medical History:  Diagnosis Date  . Ankle fracture    left  . Anxiety    chronic  . Cervical back pain with evidence of disc disease   . Diabetes mellitus   . Dyslipidemia   . Edema    lower extremity  . GERD (gastroesophageal reflux disease)   . Hyperlipidemia   . Hypertension   . Hypothyroid   . Obstructive apnea    Split study 05-22-12 - patient was too claustrophobic and did not persue CPAP.   Marland Kitchen Osteoarthritis   . PVC's (premature ventricular contractions)   . Retinopathy    diabetic, mild, nonproliferative bilateral  . TSH (thyroid-stimulating hormone deficiency)    evaluation off RX in 4/14  . Vitamin D deficiency     Past Surgical History:  Procedure Laterality Date  . ABDOMINAL HYSTERECTOMY    . AMPUTATION Left 09/21/2020   Procedure: AMPUTATION LEFT 4TH TOE;  Surgeon: Felipa Furnace, DPM;  Location: [place];  Service: Podiatry;  Laterality: Left;  . APPLICATION OF A-CELL OF EXTREMITY Left 09/21/2020   Procedure: APPLICATION OF A-CELL, left foot;  Surgeon: Felipa Furnace, DPM;  Location: [place];  Service: Podiatry;  Laterality: Left;  . CATARACT EXTRACTION Bilateral 2012  . EYE SURGERY Right 06/26/11   cataract removal Rt eye  . FOOT  SURGERY     Ankle fracture  . index finger surgery  2007   spider bite  . IR RADIOLOGIST EVAL & MGMT  09/14/2020  . ORIF ANKLE FRACTURE Left 4709,2957  . Right knee surgery  1966   torn cartilage  . TONSILLECTOMY  1962   adenoidectomy  . TUBAL LIGATION Bilateral 1986    There were no vitals filed for this visit.   Subjective Assessment - 03/01/21 1144    Subjective "its called rain, it like my arthritis it wakes it up really well" "From top oh head to bottom of toes "    Currently in Pain? Yes    Pain Score 3     Pain Location --   arms and back                            [facility] Adult PT Treatment/Exercise - 03/01/21 0001      Lumbar Exercises: Aerobic   UBE (Upper Arm Bike) L2 x 3 min each    Nustep L4 x 4 min      Lumbar Exercises: Machines for Strengthening   Other Lumbar Machine Exercise Rows & Lats 20lb 2x10      Knee/Hip Exercises: Machines for Strengthening   Cybex Knee Extension  10lb x10, SL 5lb x10 each    Cybex Knee Flexion 25lb 2x10    Cybex Leg Press 30lb 3x10      Knee/Hip Exercises: Standing   Other Standing Knee Exercises 40lb forward/back x3    Other Standing Knee Exercises Alt 4 in box taps 2x10                    PT Short Term Goals - 01/29/21 1525      PT SHORT TERM GOAL #1   Title Independent with HEP    Time 2    Period Weeks    Status New    Target Date 02/12/21             PT Long Term Goals - 02/27/21 1203      PT LONG TERM GOAL #1   Title Pt will demo improved berg to at least 45/56 to demo improved balance    Status Partially Met   41/56     PT LONG TERM GOAL #3   Title Pt demo normalized gait pattern with minimal antalgic limp and increased symmetry of gait without LOB to facilitate safe negotiation of home an dcommunity environments.    Status Partially Met                 Plan - 03/01/21 1229    Clinical Impression Statement Pt reports the weather has cause some increase discomfort.  Good carryover from last session with machine level interventions. Cues needed to control the eccentric phase of resisted gait. Some instability noted with alt box taps.    Personal Factors and Comorbidities Comorbidity 3+;Past/Current Experience;Time since onset of injury/illness/exacerbation    Examination-Activity Limitations Locomotion Level;Transfers;Stairs;Squat    Stability/Clinical Decision Making Evolving/Moderate complexity    Rehab Potential Good    PT Frequency 2x / week    PT Duration 8 weeks    PT Treatment/Interventions ADLs/Self Care Home Management;Cryotherapy;Iontophoresis 69m/ml Dexamethasone;Moist Heat;Gait training;Neuromuscular re-education;Balance training;Therapeutic exercise;Electrical Stimulation;Therapeutic activities;Functional mobility training;Stair training;Patient/family education;Manual techniques;Energy conservation;Taping;Vasopneumatic Device;Vestibular    PT Next Visit Plan Keep in mind neuropathy - limited functional grip strength with some numbness in hands. progressive strength and balance training as tolerated. manual and modalities as needed.           Patient will benefit from skilled therapeutic intervention in order to improve the following deficits and impairments:  Abnormal gait,Difficulty walking,Increased muscle spasms,Decreased endurance,Decreased activity tolerance,Pain,Improper body mechanics,Impaired flexibility,Decreased balance,Decreased mobility,Decreased strength,Increased edema,Impaired sensation,Hypomobility  Visit Diagnosis: Other abnormalities of gait and mobility  Muscle weakness (generalized)  Unsteadiness on feet  History of falling     Problem List Patient Active Problem List   Diagnosis Date Noted  . S/P amputation of foot, right ([place]   . Diabetic foot ulcer ([place] 09/15/2020  . Ankle pain   . Epidural abscess   . Abscess in epidural space of lumbar spine 09/12/2020  . Obstructive apnea   . Sleep disturbance  08/17/2012  . History of mammogram 07/29/2012  . Chest pain 04/06/2012  . EDEMA 05/16/2008  . Morbid obesity ([place] 04/13/2008  . CARPAL TUNNEL SYNDROME, BILATERAL 01/15/2008  . ALLERGIC RHINITIS, SEASONAL 01/06/2008  . ANXIETY DISORDER, GENERALIZED 05/01/2007  . PERIPHERAL NEUROPATHY 05/01/2007  . ENDOMETRIAL POLYP 05/01/2007  . HYPERPLASIA, ENDOMETRIAL NOS 05/01/2007  . Type 2 diabetes mellitus with hyperglycemia ([place] 01/29/2007  . HYPERLIPIDEMIA 01/29/2007  . ANEMIA, OTHER, UNSPECIFIED 01/29/2007  . HYPERTENSION, BENIGN SYSTEMIC 01/29/2007    RScot Jun3/31/2022, 12:31 PM  [place]. [place], [place], [address] Phone: [phone]   Fax:  [phone]  Name: Brittany Silva MRN: [id number] Date of Birth: December 28, 1955

## 2021-03-02 ENCOUNTER — Ambulatory Visit (INDEPENDENT_AMBULATORY_CARE_PROVIDER_SITE_OTHER): Payer: Medicare Other | Admitting: Podiatry

## 2021-03-02 DIAGNOSIS — L97529 Non-pressure chronic ulcer of other part of left foot with unspecified severity: Secondary | ICD-10-CM

## 2021-03-02 DIAGNOSIS — E11621 Type 2 diabetes mellitus with foot ulcer: Secondary | ICD-10-CM

## 2021-03-02 DIAGNOSIS — S98132A Complete traumatic amputation of one left lesser toe, initial encounter: Secondary | ICD-10-CM

## 2021-03-06 ENCOUNTER — Other Ambulatory Visit: Payer: Self-pay

## 2021-03-06 ENCOUNTER — Ambulatory Visit: Payer: Medicare Other | Attending: Neurological Surgery

## 2021-03-06 DIAGNOSIS — R2689 Other abnormalities of gait and mobility: Secondary | ICD-10-CM | POA: Diagnosis not present

## 2021-03-06 DIAGNOSIS — M25631 Stiffness of right wrist, not elsewhere classified: Secondary | ICD-10-CM | POA: Insufficient documentation

## 2021-03-06 DIAGNOSIS — M25632 Stiffness of left wrist, not elsewhere classified: Secondary | ICD-10-CM | POA: Insufficient documentation

## 2021-03-06 DIAGNOSIS — M62830 Muscle spasm of back: Secondary | ICD-10-CM

## 2021-03-06 DIAGNOSIS — M25511 Pain in right shoulder: Secondary | ICD-10-CM | POA: Insufficient documentation

## 2021-03-06 DIAGNOSIS — R42 Dizziness and giddiness: Secondary | ICD-10-CM

## 2021-03-06 DIAGNOSIS — Z9181 History of falling: Secondary | ICD-10-CM | POA: Insufficient documentation

## 2021-03-06 DIAGNOSIS — R209 Unspecified disturbances of skin sensation: Secondary | ICD-10-CM | POA: Diagnosis present

## 2021-03-06 DIAGNOSIS — M6281 Muscle weakness (generalized): Secondary | ICD-10-CM | POA: Insufficient documentation

## 2021-03-06 DIAGNOSIS — R2681 Unsteadiness on feet: Secondary | ICD-10-CM

## 2021-03-06 NOTE — Therapy (Signed)
[place]. [place], [place], [address] Phone: [phone]   Fax:  [phone]  Physical Therapy Treatment  Patient Details  Name: Brittany Silva MRN: [id number] Date of Birth: Nov 01, 1956 Referring Provider (PT): Dawley, Theodoro Doing, DO neurology   Encounter Date: 03/06/2021   PT End of Session - 03/06/21 1154    Visit Number 9    Number of Visits 17    Date for PT Re-Evaluation 03/26/21    Authorization Type medicare and UHC    PT Start Time 1103    PT Stop Time 1146    PT Time Calculation (min) 43 min    Activity Tolerance Patient limited by fatigue;Patient tolerated treatment well    Behavior During Therapy [facility] for tasks assessed/performed           Past Medical History:  Diagnosis Date  . Ankle fracture    left  . Anxiety    chronic  . Cervical back pain with evidence of disc disease   . Diabetes mellitus   . Dyslipidemia   . Edema    lower extremity  . GERD (gastroesophageal reflux disease)   . Hyperlipidemia   . Hypertension   . Hypothyroid   . Obstructive apnea    Split study 05-22-12 - patient was too claustrophobic and did not persue CPAP.   Marland Kitchen Osteoarthritis   . PVC's (premature ventricular contractions)   . Retinopathy    diabetic, mild, nonproliferative bilateral  . TSH (thyroid-stimulating hormone deficiency)    evaluation off RX in 4/14  . Vitamin D deficiency     Past Surgical History:  Procedure Laterality Date  . ABDOMINAL HYSTERECTOMY    . AMPUTATION Left 09/21/2020   Procedure: AMPUTATION LEFT 4TH TOE;  Surgeon: Felipa Furnace, DPM;  Location: [place];  Service: Podiatry;  Laterality: Left;  . APPLICATION OF A-CELL OF EXTREMITY Left 09/21/2020   Procedure: APPLICATION OF A-CELL, left foot;  Surgeon: Felipa Furnace, DPM;  Location: [place];  Service: Podiatry;  Laterality: Left;  . CATARACT EXTRACTION Bilateral 2012  . EYE SURGERY Right 06/26/11   cataract removal Rt eye  . FOOT  SURGERY     Ankle fracture  . index finger surgery  2007   spider bite  . IR RADIOLOGIST EVAL & MGMT  09/14/2020  . ORIF ANKLE FRACTURE Left 4627,0350  . Right knee surgery  1966   torn cartilage  . TONSILLECTOMY  1962   adenoidectomy  . TUBAL LIGATION Bilateral 1986    There were no vitals filed for this visit.   Subjective Assessment - 03/06/21 1104    Subjective tweaked back over weekend running after granddog but still low    Pertinent History Broke L ankle about 20 yrs ago with pin and screw placement x 2. had L4-5 facet joint and epidural abscess with guided aspiration October 2021. 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 Posey Pronto. Both hands go numb (chronic since ~2015). Arthritis. T1DM on insulin with peripheral neuropathy of hands and feet.    Limitations Lifting;Standing;Walking;House hold activities    Currently in Pain? Yes    Pain Score 2     Pain Location Back    Pain Orientation Lower             [facility] Adult PT Treatment/Exercise - 03/06/21 0001      High Level Balance   High Level Balance Activities Backward walking;Side stepping;Turns   tandem walks on airex  High Level Balance Comments side step overs foam roll x 5 B      Lumbar Exercises: Standing   Other Standing Lumbar Exercises sit to stands 10 x 2      Lumbar Exercises: Seated   Other Seated Lumbar Exercises seated scap retractions x5, shoulder rolls x 5 B.      Knee/Hip Exercises: Standing   Other Standing Knee Exercises 40lb forward/back and lateral B x 3 each    Other Standing Knee Exercises Alt 6 in box taps 2x10   2nd set with cue to make little noise with feet - emphasis on decr speed of steps and increase SLS time.                 PT Education - 03/06/21 1153    Education Details Updated initial HEP. Access Code: [email or web address]  URL: [email or web address]  Date: 03/06/2021  Prepared by: Sherlynn Stalls    Program Notes  let one or both hands hover over support surface  with balance exercises to further challenge your balance.       Exercises  Sit to Stand with Counter Support - 1 x daily - 5 x weekly - 3 sets - 10 reps  Side Stepping with Counter Support - 1 x daily - 5 x weekly - 2 sets - 10 reps  Backward Walking with Counter Support - 1 x daily - 5 x weekly - 2 sets - 10 reps  Standing March with Counter Support - 1 x daily - 7 x weekly - 2 sets - 10 reps    Methods Explanation;Demonstration;Handout    Comprehension Verbalized understanding;Returned demonstration;Need further instruction            PT Short Term Goals - 01/29/21 1525      PT SHORT TERM GOAL #1   Title Independent with HEP    Time 2    Period Weeks    Status New    Target Date 02/12/21             PT Long Term Goals - 02/27/21 1203      PT LONG TERM GOAL #1   Title Pt will demo improved berg to at least 45/56 to demo improved balance    Status Partially Met   41/56     PT LONG TERM GOAL #3   Title Pt demo normalized gait pattern with minimal antalgic limp and increased symmetry of gait without LOB to facilitate safe negotiation of home an dcommunity environments.    Status Partially Met                 Plan - 03/06/21 1155    Clinical Impression Statement Madisin tolerated all exercises well today. Reports she feels she is not wobbling as much but still not completely steady on feet. Emphasis of today's session on balance challenges. Reports she has not been doing too much of the exercises other than standing at kitchen counter side stepping. Updated HEP to incorporate stadning exercises to be done at kitchen counter and discussed how to progress balance challenge (both hands supported -> 1 hand supported -> no hands on counter/hands only hovering counter).    Personal Factors and Comorbidities Comorbidity 3+;Past/Current Experience;Time since onset of injury/illness/exacerbation    Examination-Activity Limitations Locomotion Level;Transfers;Stairs;Squat    Rehab  Potential Good    PT Frequency 2x / week    PT Duration 8 weeks    PT Treatment/Interventions ADLs/Self Care Home Management;Cryotherapy;Iontophoresis 42m/ml Dexamethasone;Moist Heat;Gait training;Neuromuscular re-education;Balance training;Therapeutic  exercise;Electrical Stimulation;Therapeutic activities;Functional mobility training;Stair training;Patient/family education;Manual techniques;Energy conservation;Taping;Vasopneumatic Device;Vestibular    PT Next Visit Plan Keep in mind neuropathy - limited functional grip strength with some numbness in hands. progressive strength and balance training as tolerated. manual and modalities as needed.    PT Home Exercise Plan see pt edu    Consulted and Agree with Plan of Care Patient           Patient will benefit from skilled therapeutic intervention in order to improve the following deficits and impairments:  Abnormal gait,Difficulty walking,Increased muscle spasms,Decreased endurance,Decreased activity tolerance,Pain,Improper body mechanics,Impaired flexibility,Decreased balance,Decreased mobility,Decreased strength,Increased edema,Impaired sensation,Hypomobility  Visit Diagnosis: Other abnormalities of gait and mobility  Muscle weakness (generalized)  Unsteadiness on feet  History of falling  Muscle spasm of back  Dizziness and giddiness     Problem List Patient Active Problem List   Diagnosis Date Noted  . S/P amputation of foot, right ([place])   . Diabetic foot ulcer ([place]) 09/15/2020  . Ankle pain   . Epidural abscess   . Abscess in epidural space of lumbar spine 09/12/2020  . Obstructive apnea   . Sleep disturbance 08/17/2012  . History of mammogram 07/29/2012  . Chest pain 04/06/2012  . EDEMA 05/16/2008  . Morbid obesity ([place]) 04/13/2008  . CARPAL TUNNEL SYNDROME, BILATERAL 01/15/2008  . ALLERGIC RHINITIS, SEASONAL 01/06/2008  . ANXIETY DISORDER, GENERALIZED 05/01/2007  . PERIPHERAL NEUROPATHY 05/01/2007  . ENDOMETRIAL  POLYP 05/01/2007  . HYPERPLASIA, ENDOMETRIAL NOS 05/01/2007  . Type 2 diabetes mellitus with hyperglycemia ([place]) 01/29/2007  . HYPERLIPIDEMIA 01/29/2007  . ANEMIA, OTHER, UNSPECIFIED 01/29/2007  . HYPERTENSION, BENIGN SYSTEMIC 01/29/2007    Hall Busing, PT, DPT 03/06/2021, 12:00 PM  [place]. [place], [place], [address] Phone: [phone]   Fax:  [phone]  Name: MAKYIA ERXLEBEN MRN: [id number] Date of Birth: 1955/12/17

## 2021-03-06 NOTE — Progress Notes (Signed)
The patient presented on 03-02-2021 to pick up diabetic shoes and 3 pair diabetic custom inserts.  1 pair of inserts were put in the shoes and the shoes were fitted to the patient. The patient states they are comfortable and free of defect. She was satisfied with the fit of the shoe. Instructions for break in and wear were dispensed. The patient signed the delivery documentation and break in instruction form.  If any concerns or questions arise, she is instructed to call.

## 2021-03-08 ENCOUNTER — Encounter: Payer: Self-pay | Admitting: Physical Therapy

## 2021-03-08 ENCOUNTER — Other Ambulatory Visit: Payer: Self-pay

## 2021-03-08 ENCOUNTER — Ambulatory Visit: Payer: Medicare Other | Admitting: Physical Therapy

## 2021-03-08 DIAGNOSIS — R2689 Other abnormalities of gait and mobility: Secondary | ICD-10-CM

## 2021-03-08 DIAGNOSIS — Z9181 History of falling: Secondary | ICD-10-CM

## 2021-03-08 DIAGNOSIS — R2681 Unsteadiness on feet: Secondary | ICD-10-CM

## 2021-03-08 DIAGNOSIS — M6281 Muscle weakness (generalized): Secondary | ICD-10-CM

## 2021-03-08 NOTE — Therapy (Signed)
[place]. [place], [place], [address] Phone: [phone]   Fax:  [phone] Progress Note Reporting Period 01/29/21 to 03/08/21 for the first 10 visits  See note below for Objective Data and Assessment of Progress/Goals.      Physical Therapy Treatment  Patient Details  Name: Brittany Silva MRN: [id number] Date of Birth: 1955-12-31 Referring Provider (PT): Dawley, Theodoro Doing, DO neurology   Encounter Date: 03/08/2021   PT End of Session - 03/08/21 1227    Visit Number 10    Number of Visits 17    Date for PT Re-Evaluation 03/26/21    Authorization Type medicare and UHC    PT Start Time 2952    PT Stop Time 1227    PT Time Calculation (min) 42 min    Activity Tolerance Patient limited by fatigue;Patient tolerated treatment well    Behavior During Therapy [facility] for tasks assessed/performed           Past Medical History:  Diagnosis Date  . Ankle fracture    left  . Anxiety    chronic  . Cervical back pain with evidence of disc disease   . Diabetes mellitus   . Dyslipidemia   . Edema    lower extremity  . GERD (gastroesophageal reflux disease)   . Hyperlipidemia   . Hypertension   . Hypothyroid   . Obstructive apnea    Split study 05-22-12 - patient was too claustrophobic and did not persue CPAP.   Marland Kitchen Osteoarthritis   . PVC's (premature ventricular contractions)   . Retinopathy    diabetic, mild, nonproliferative bilateral  . TSH (thyroid-stimulating hormone deficiency)    evaluation off RX in 4/14  . Vitamin D deficiency     Past Surgical History:  Procedure Laterality Date  . ABDOMINAL HYSTERECTOMY    . AMPUTATION Left 09/21/2020   Procedure: AMPUTATION LEFT 4TH TOE;  Surgeon: Felipa Furnace, DPM;  Location: [place];  Service: Podiatry;  Laterality: Left;  . APPLICATION OF A-CELL OF EXTREMITY Left 09/21/2020   Procedure: APPLICATION OF A-CELL, left foot;  Surgeon: Felipa Furnace, DPM;  Location:  [place];  Service: Podiatry;  Laterality: Left;  . CATARACT EXTRACTION Bilateral 2012  . EYE SURGERY Right 06/26/11   cataract removal Rt eye  . FOOT SURGERY     Ankle fracture  . index finger surgery  2007   spider bite  . IR RADIOLOGIST EVAL & MGMT  09/14/2020  . ORIF ANKLE FRACTURE Left 8413,2440  . Right knee surgery  1966   torn cartilage  . TONSILLECTOMY  1962   adenoidectomy  . TUBAL LIGATION Bilateral 1986    There were no vitals filed for this visit.   Subjective Assessment - 03/08/21 1148    Subjective "I was pretty good until the right over here, it was a little rough, the roads are awful."    Currently in Pain? 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      Lumbar Exercises: Aerobic   Nustep L5 x 6 min      Lumbar Exercises: Standing   Other Standing Lumbar Exercises Rows & Ext green Tband 2x15    Other Standing Lumbar Exercises standing alt 6'' box taps 2x10      Lumbar Exercises: Seated   Sit to Stand 10 reps                    PT Short Term Goals - 01/29/21 1525      PT SHORT TERM GOAL #1   Title Independent with HEP    Time 2    Period Weeks    Status New    Target Date 02/12/21             PT Long Term Goals - 03/08/21 1230      PT LONG TERM GOAL #1   Title Pt will demo improved berg to at least 45/56 to demo improved balance    Status Partially Met      PT LONG TERM GOAL #2   Title Pt will  complete 5TSTS in no greater than 12 seconds to demo improved functional strength and balance    Status On-going      PT LONG TERM GOAL #3   Title Pt demo normalized gait pattern with minimal antalgic limp and increased symmetry of gait without LOB to facilitate safe negotiation of home an dcommunity environments.    Status Partially Met      PT LONG TERM GOAL #4   Title BLE strength at least 4+/5    Status On-going                 Plan - 03/08/21 1227    Clinical Impression Statement All interventions completed, during session pt reported the dampness is causing some increase discomfort with standing rows. She did well with side step, but some instability with backwards walking. Step too pattern chosen with stair negotiation due to decrease L ankle motion. Pt reports that's he has broken that ankle in the past and her bones has fused together causing decrease mobility. Pt fatigue quick, often times needed to sit and rest during sets of interventions in standing.    Personal Factors and Comorbidities Comorbidity 3+;Past/Current Experience;Time since onset of injury/illness/exacerbation    Examination-Activity Limitations Locomotion Level;Transfers;Stairs;Squat    Stability/Clinical Decision Making Evolving/Moderate complexity    Rehab Potential Good    PT Frequency 2x / week    PT Duration 8 weeks    PT Treatment/Interventions ADLs/Self Care Home Management;Cryotherapy;Iontophoresis 49m/ml Dexamethasone;Moist Heat;Gait training;Neuromuscular re-education;Balance training;Therapeutic exercise;Electrical Stimulation;Therapeutic activities;Functional mobility training;Stair training;Patient/family education;Manual techniques;Energy conservation;Taping;Vasopneumatic Device;Vestibular    PT Next Visit Plan Keep in mind neuropathy - limited functional grip strength with some numbness in hands. progressive strength and balance training as tolerated. manual and modalities as needed.            Patient will benefit from skilled therapeutic intervention in order to improve the following deficits and impairments:  Abnormal gait,Difficulty walking,Increased muscle spasms,Decreased endurance,Decreased activity tolerance,Pain,Improper body mechanics,Impaired flexibility,Decreased balance,Decreased mobility,Decreased strength,Increased edema,Impaired sensation,Hypomobility  Visit Diagnosis: Unsteadiness on feet  Muscle weakness (generalized)  Other abnormalities of  gait and mobility  History of falling     Problem List Patient Active Problem List   Diagnosis Date Noted  . S/P amputation of foot, right ([place])   . Diabetic foot ulcer ([place]) 09/15/2020  . Ankle pain   . Epidural abscess   . Abscess in epidural space of lumbar spine 09/12/2020  . Obstructive apnea   . Sleep disturbance 08/17/2012  . History of mammogram 07/29/2012  . Chest pain 04/06/2012  . EDEMA 05/16/2008  . Morbid obesity ([place]) 04/13/2008  . CARPAL TUNNEL SYNDROME, BILATERAL 01/15/2008  . ALLERGIC RHINITIS, SEASONAL 01/06/2008  . ANXIETY DISORDER, GENERALIZED 05/01/2007  . PERIPHERAL NEUROPATHY 05/01/2007  . ENDOMETRIAL POLYP 05/01/2007  . HYPERPLASIA, ENDOMETRIAL NOS 05/01/2007  . Type 2 diabetes mellitus with hyperglycemia ([place]) 01/29/2007  . HYPERLIPIDEMIA 01/29/2007  . ANEMIA, OTHER, UNSPECIFIED 01/29/2007  . HYPERTENSION, BENIGN SYSTEMIC 01/29/2007    Scot Jun 03/08/2021, 12:31 PM  [place]. [place], [place], [address] Phone: [phone]   Fax:  [phone]  Name: Brittany Silva MRN: [id number] Date of Birth: Aug 21, 1956

## 2021-03-13 ENCOUNTER — Encounter: Payer: Self-pay | Admitting: Physical Therapy

## 2021-03-13 ENCOUNTER — Ambulatory Visit: Payer: Medicare Other | Admitting: Physical Therapy

## 2021-03-13 ENCOUNTER — Other Ambulatory Visit: Payer: Self-pay

## 2021-03-13 DIAGNOSIS — R2681 Unsteadiness on feet: Secondary | ICD-10-CM

## 2021-03-13 DIAGNOSIS — R2689 Other abnormalities of gait and mobility: Secondary | ICD-10-CM

## 2021-03-13 DIAGNOSIS — Z9181 History of falling: Secondary | ICD-10-CM

## 2021-03-13 DIAGNOSIS — M6281 Muscle weakness (generalized): Secondary | ICD-10-CM

## 2021-03-13 NOTE — Therapy (Signed)
[place]. [place], [place], [address] Phone: [phone]   Fax:  [phone]  Physical Therapy Treatment  Patient Details  Name: Brittany Silva MRN: [id number] Date of Birth: 04-09-1956 Referring Provider (PT): Dawley, Theodoro Doing, DO neurology   Encounter Date: 03/13/2021   PT End of Session - 03/13/21 1227    Visit Number 11    Number of Visits 17    Date for PT Re-Evaluation 03/26/21    Authorization Type medicare and UHC    PT Start Time 5638    PT Stop Time 1227    PT Time Calculation (min) 42 min    Activity Tolerance Patient limited by fatigue;Patient tolerated treatment well    Behavior During Therapy [facility] for tasks assessed/performed           Past Medical History:  Diagnosis Date  . Ankle fracture    left  . Anxiety    chronic  . Cervical back pain with evidence of disc disease   . Diabetes mellitus   . Dyslipidemia   . Edema    lower extremity  . GERD (gastroesophageal reflux disease)   . Hyperlipidemia   . Hypertension   . Hypothyroid   . Obstructive apnea    Split study 05-22-12 - patient was too claustrophobic and did not persue CPAP.   Marland Kitchen Osteoarthritis   . PVC's (premature ventricular contractions)   . Retinopathy    diabetic, mild, nonproliferative bilateral  . TSH (thyroid-stimulating hormone deficiency)    evaluation off RX in 4/14  . Vitamin D deficiency     Past Surgical History:  Procedure Laterality Date  . ABDOMINAL HYSTERECTOMY    . AMPUTATION Left 09/21/2020   Procedure: AMPUTATION LEFT 4TH TOE;  Surgeon: Felipa Furnace, DPM;  Location: [place];  Service: Podiatry;  Laterality: Left;  . APPLICATION OF A-CELL OF EXTREMITY Left 09/21/2020   Procedure: APPLICATION OF A-CELL, left foot;  Surgeon: Felipa Furnace, DPM;  Location: [place];  Service: Podiatry;  Laterality: Left;  . CATARACT EXTRACTION Bilateral 2012  . EYE SURGERY Right 06/26/11   cataract removal Rt eye  . FOOT  SURGERY     Ankle fracture  . index finger surgery  2007   spider bite  . IR RADIOLOGIST EVAL & MGMT  09/14/2020  . ORIF ANKLE FRACTURE Left 7564,3329  . Right knee surgery  1966   torn cartilage  . TONSILLECTOMY  1962   adenoidectomy  . TUBAL LIGATION Bilateral 1986    There were no vitals filed for this visit.   Subjective Assessment - 03/13/21 1147    Subjective "Pretty good"    Currently in Pain? No/denies              [facility] PT Assessment - 03/13/21 0001      Timed Up and Go Test   TUG Normal TUG    Normal TUG (seconds) 11                         [facility] Adult PT Treatment/Exercise - 03/13/21 0001      Lumbar Exercises: Aerobic   Nustep L5 x 7 min      Lumbar Exercises: Machines for Strengthening   Other Lumbar Machine Exercise Rows & Lats 25lb 2x10      Knee/Hip Exercises: Machines for Strengthening   Cybex Knee Extension 10lb x10, SL 5lb x10 each    Cybex Knee Flexion  35lb 2x10    Cybex Leg Press 30lb 3x10      Knee/Hip Exercises: Standing   Other Standing Knee Exercises 40lb side step x 5 each                    PT Short Term Goals - 01/29/21 1525      PT SHORT TERM GOAL #1   Title Independent with HEP    Time 2    Period Weeks    Status New    Target Date 02/12/21             PT Long Term Goals - 03/13/21 1159      PT LONG TERM GOAL #2   Title Pt will complete 5TSTS in no greater than 12 seconds to demo improved functional strength and balance    Status Achieved   11 sec                Plan - 03/13/21 1227    Clinical Impression Statement Pt has progressed decreasing her TUG time meeting goal. increase resistance tolerated with machine level interventions. Curs needed not to drag LEs with resisted side steps.    Personal Factors and Comorbidities Comorbidity 3+;Past/Current Experience;Time since onset of injury/illness/exacerbation    Examination-Activity Limitations Locomotion Level;Transfers;Stairs;Squat     Stability/Clinical Decision Making Evolving/Moderate complexity    Rehab Potential Good    PT Frequency 2x / week    PT Duration 8 weeks    PT Treatment/Interventions ADLs/Self Care Home Management;Cryotherapy;Iontophoresis 4mg /ml Dexamethasone;Moist Heat;Gait training;Neuromuscular re-education;Balance training;Therapeutic exercise;Electrical Stimulation;Therapeutic activities;Functional mobility training;Stair training;Patient/family education;Manual techniques;Energy conservation;Taping;Vasopneumatic Device;Vestibular    PT Next Visit Plan Keep in mind neuropathy - limited functional grip strength with some numbness in hands. progressive strength and balance training as tolerated. manual and modalities as needed.           Patient will benefit from skilled therapeutic intervention in order to improve the following deficits and impairments:  Abnormal gait,Difficulty walking,Increased muscle spasms,Decreased endurance,Decreased activity tolerance,Pain,Improper body mechanics,Impaired flexibility,Decreased balance,Decreased mobility,Decreased strength,Increased edema,Impaired sensation,Hypomobility  Visit Diagnosis: Muscle weakness (generalized)  Unsteadiness on feet  Other abnormalities of gait and mobility  History of falling     Problem List Patient Active Problem List   Diagnosis Date Noted  . S/P amputation of foot, right ([place])   . Diabetic foot ulcer ([place]) 09/15/2020  . Ankle pain   . Epidural abscess   . Abscess in epidural space of lumbar spine 09/12/2020  . Obstructive apnea   . Sleep disturbance 08/17/2012  . History of mammogram 07/29/2012  . Chest pain 04/06/2012  . EDEMA 05/16/2008  . Morbid obesity ([place]) 04/13/2008  . CARPAL TUNNEL SYNDROME, BILATERAL 01/15/2008  . ALLERGIC RHINITIS, SEASONAL 01/06/2008  . ANXIETY DISORDER, GENERALIZED 05/01/2007  . PERIPHERAL NEUROPATHY 05/01/2007  . ENDOMETRIAL POLYP 05/01/2007  . HYPERPLASIA, ENDOMETRIAL NOS 05/01/2007   . Type 2 diabetes mellitus with hyperglycemia ([place]) 01/29/2007  . HYPERLIPIDEMIA 01/29/2007  . ANEMIA, OTHER, UNSPECIFIED 01/29/2007  . HYPERTENSION, BENIGN SYSTEMIC 01/29/2007    Scot Jun 03/13/2021, 12:29 PM  [place]. [place], [place], [address] Phone: [phone]   Fax:  [phone]  Name: Brittany Silva MRN: [id number] Date of Birth: 1956/02/28

## 2021-03-15 ENCOUNTER — Other Ambulatory Visit: Payer: Self-pay

## 2021-03-15 ENCOUNTER — Encounter: Payer: Self-pay | Admitting: Physical Therapy

## 2021-03-15 ENCOUNTER — Ambulatory Visit: Payer: Medicare Other | Admitting: Physical Therapy

## 2021-03-15 DIAGNOSIS — R2681 Unsteadiness on feet: Secondary | ICD-10-CM

## 2021-03-15 DIAGNOSIS — Z9181 History of falling: Secondary | ICD-10-CM

## 2021-03-15 DIAGNOSIS — R2689 Other abnormalities of gait and mobility: Secondary | ICD-10-CM | POA: Diagnosis not present

## 2021-03-15 DIAGNOSIS — M6281 Muscle weakness (generalized): Secondary | ICD-10-CM

## 2021-03-15 NOTE — Therapy (Signed)
[place]. [place], [place], [address] Phone: [phone]   Fax:  [phone]  Physical Therapy Treatment  Patient Details  Name: Brittany Silva MRN: [id number] Date of Birth: [birth year] Referring Provider (PT): Dawley, Theodoro Doing, DO neurology   Encounter Date: 03/15/2021   PT End of Session - 03/15/21 1226    Visit Number 12    Date for PT Re-Evaluation 03/26/21    Authorization Type medicare and UHC    PT Start Time 1343    PT Stop Time 1226    PT Time Calculation (min) 1363 min    Activity Tolerance Patient tolerated treatment well    Behavior During Therapy [facility] for tasks assessed/performed           Past Medical History:  Diagnosis Date  . Ankle fracture    left  . Anxiety    chronic  . Cervical back pain with evidence of disc disease   . Diabetes mellitus   . Dyslipidemia   . Edema    lower extremity  . GERD (gastroesophageal reflux disease)   . Hyperlipidemia   . Hypertension   . Hypothyroid   . Obstructive apnea    Split study 05-22-12 - patient was too claustrophobic and did not persue CPAP.   Marland Kitchen Osteoarthritis   . PVC's (premature ventricular contractions)   . Retinopathy    diabetic, mild, nonproliferative bilateral  . TSH (thyroid-stimulating hormone deficiency)    evaluation off RX in 4/14  . Vitamin D deficiency     Past Surgical History:  Procedure Laterality Date  . ABDOMINAL HYSTERECTOMY    . AMPUTATION Left 09/21/2020   Procedure: AMPUTATION LEFT 4TH TOE;  Surgeon: Felipa Furnace, DPM;  Location: [place];  Service: Podiatry;  Laterality: Left;  . APPLICATION OF A-CELL OF EXTREMITY Left 09/21/2020   Procedure: APPLICATION OF A-CELL, left foot;  Surgeon: Felipa Furnace, DPM;  Location: [place];  Service: Podiatry;  Laterality: Left;  . CATARACT EXTRACTION Bilateral 2012  . EYE SURGERY Right 06/26/11   cataract removal Rt eye  . FOOT SURGERY     Ankle fracture  . index finger  surgery  2007   spider bite  . IR RADIOLOGIST EVAL & MGMT  09/14/2020  . ORIF ANKLE FRACTURE Left 2778,2423  . Right knee surgery  1966   torn cartilage  . TONSILLECTOMY  1962   adenoidectomy  . TUBAL LIGATION Bilateral 1986    There were no vitals filed for this visit.   Subjective Assessment - 03/15/21 1144    Subjective "A little stiff weather related"    Pertinent History Broke L ankle about 20 yrs ago with pin and screw placement x 2. had L4-5 facet joint and epidural abscess with guided aspiration October 2021. 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 Posey Pronto. Both hands go numb (chronic since ~2015). Arthritis. T1DM on insulin with peripheral neuropathy of hands and feet.    Currently in Pain? Yes    Pain Score 2     Pain Location --   all over                            [facility] Adult PT Treatment/Exercise - 03/15/21 0001      Lumbar Exercises: Aerobic   Tread Mill 1.5 2 min each    Nustep L5 x 5 min      Lumbar Exercises: Standing  Shoulder Extension PROM    Shoulder Extension Limitations 10      Knee/Hip Exercises: Machines for Strengthening   Cybex Knee Extension 10lb 2x15    Cybex Knee Flexion 35lb 2x15      Knee/Hip Exercises: Standing   Other Standing Knee Exercises 40lb 4 way x 4 each    Other Standing Knee Exercises Alt 4'' box taps 2x10, lateral 4'' box taps x10 each                    PT Short Term Goals - 01/29/21 1525      PT SHORT TERM GOAL #1   Title Independent with HEP    Time 2    Period Weeks    Status New    Target Date 02/12/21             PT Long Term Goals - 03/13/21 1159      PT LONG TERM GOAL #2   Title Pt will complete 5TSTS in no greater than 12 seconds to demo improved functional strength and balance    Status Achieved   11 sec                Plan - 03/15/21 1226    Clinical Impression Statement Pt demo ed improved stability today with resisted gait. Some LOB noted with alt box taps  but able to correct. Increase reps tolerated with curls and extensions, did report some knee pain with the latter reps of ext. Some postural fatigue and weakness noted with the additional weights on shoulder extensions.    Personal Factors and Comorbidities Comorbidity 3+;Past/Current Experience;Time since onset of injury/illness/exacerbation    Stability/Clinical Decision Making Evolving/Moderate complexity    Rehab Potential Good    PT Frequency 2x / week    PT Treatment/Interventions ADLs/Self Care Home Management;Cryotherapy;Iontophoresis 4mg /ml Dexamethasone;Moist Heat;Gait training;Neuromuscular re-education;Balance training;Therapeutic exercise;Electrical Stimulation;Therapeutic activities;Functional mobility training;Stair training;Patient/family education;Manual techniques;Energy conservation;Taping;Vasopneumatic Device;Vestibular    PT Next Visit Plan Keep in mind neuropathy - limited functional grip strength with some numbness in hands. progressive strength and balance training as tolerated. manual and modalities as needed.           Patient will benefit from skilled therapeutic intervention in order to improve the following deficits and impairments:  Abnormal gait,Difficulty walking,Increased muscle spasms,Decreased endurance,Decreased activity tolerance,Pain,Improper body mechanics,Impaired flexibility,Decreased balance,Decreased mobility,Decreased strength,Increased edema,Impaired sensation,Hypomobility  Visit Diagnosis: Muscle weakness (generalized)  Unsteadiness on feet  History of falling     Problem List Patient Active Problem List   Diagnosis Date Noted  . S/P amputation of foot, right ([place])   . Diabetic foot ulcer ([place]) 09/15/2020  . Ankle pain   . Epidural abscess   . Abscess in epidural space of lumbar spine 09/12/2020  . Obstructive apnea   . Sleep disturbance 08/17/2012  . History of mammogram 07/29/2012  . Chest pain 04/06/2012  . EDEMA 05/16/2008  .  Morbid obesity ([place]) 04/13/2008  . CARPAL TUNNEL SYNDROME, BILATERAL 01/15/2008  . ALLERGIC RHINITIS, SEASONAL 01/06/2008  . ANXIETY DISORDER, GENERALIZED 05/01/2007  . PERIPHERAL NEUROPATHY 05/01/2007  . ENDOMETRIAL POLYP 05/01/2007  . HYPERPLASIA, ENDOMETRIAL NOS 05/01/2007  . Type 2 diabetes mellitus with hyperglycemia ([place]) 01/29/2007  . HYPERLIPIDEMIA 01/29/2007  . ANEMIA, OTHER, UNSPECIFIED 01/29/2007  . HYPERTENSION, BENIGN SYSTEMIC 01/29/2007    Scot Jun 03/15/2021, 12:29 PM  [place]. [place], [place], [address] Phone: [phone]   Fax:  [phone]  Name:  Brittany Silva MRN: [id number] Date of Birth: [birth year]

## 2021-03-20 ENCOUNTER — Other Ambulatory Visit: Payer: Self-pay

## 2021-03-20 ENCOUNTER — Ambulatory Visit: Payer: Medicare Other

## 2021-03-20 DIAGNOSIS — R209 Unspecified disturbances of skin sensation: Secondary | ICD-10-CM

## 2021-03-20 DIAGNOSIS — M62830 Muscle spasm of back: Secondary | ICD-10-CM

## 2021-03-20 DIAGNOSIS — M25631 Stiffness of right wrist, not elsewhere classified: Secondary | ICD-10-CM

## 2021-03-20 DIAGNOSIS — M6281 Muscle weakness (generalized): Secondary | ICD-10-CM

## 2021-03-20 DIAGNOSIS — R2681 Unsteadiness on feet: Secondary | ICD-10-CM

## 2021-03-20 DIAGNOSIS — M25511 Pain in right shoulder: Secondary | ICD-10-CM

## 2021-03-20 DIAGNOSIS — R2689 Other abnormalities of gait and mobility: Secondary | ICD-10-CM | POA: Diagnosis not present

## 2021-03-20 DIAGNOSIS — M25632 Stiffness of left wrist, not elsewhere classified: Secondary | ICD-10-CM

## 2021-03-20 DIAGNOSIS — R42 Dizziness and giddiness: Secondary | ICD-10-CM

## 2021-03-20 DIAGNOSIS — Z9181 History of falling: Secondary | ICD-10-CM

## 2021-03-20 NOTE — Therapy (Signed)
[place]. [place], [place], [address] Phone: [phone]   Fax:  [phone]  Physical Therapy Treatment/ Recert  Patient Details  Name: Brittany Silva MRN: [id number] Date of Birth: Jul 18, 1956 Referring Provider (PT): Dawley, Theodoro Doing, DO neurology   Encounter Date: 03/20/2021   PT End of Session - 03/20/21 1156    Visit Number 13   Recert completed on 5/57/3220   Date for PT Re-Evaluation 04/27/21    Authorization Type medicare and [employer]    PT Start Time 1100    PT Stop Time 1145    PT Time Calculation (min) 45 min    Activity Tolerance Patient tolerated treatment well    Behavior During Therapy [facility] for tasks assessed/performed           Past Medical History:  Diagnosis Date  . Ankle fracture    left  . Anxiety    chronic  . Cervical back pain with evidence of disc disease   . Diabetes mellitus   . Dyslipidemia   . Edema    lower extremity  . GERD (gastroesophageal reflux disease)   . Hyperlipidemia   . Hypertension   . Hypothyroid   . Obstructive apnea    Split study 05-22-12 - patient was too claustrophobic and did not persue CPAP.   Marland Kitchen Osteoarthritis   . PVC's (premature ventricular contractions)   . Retinopathy    diabetic, mild, nonproliferative bilateral  . TSH (thyroid-stimulating hormone deficiency)    evaluation off RX in 4/14  . Vitamin D deficiency     Past Surgical History:  Procedure Laterality Date  . ABDOMINAL HYSTERECTOMY    . AMPUTATION Left 09/21/2020   Procedure: AMPUTATION LEFT 4TH TOE;  Surgeon: Felipa Furnace, DPM;  Location: [facility];  Service: Podiatry;  Laterality: Left;  . APPLICATION OF A-CELL OF EXTREMITY Left 09/21/2020   Procedure: APPLICATION OF A-CELL, left foot;  Surgeon: Felipa Furnace, DPM;  Location: [facility];  Service: Podiatry;  Laterality: Left;  . CATARACT EXTRACTION Bilateral 2012  . EYE SURGERY Right 06/26/11   cataract removal Rt eye  . FOOT SURGERY      Ankle fracture  . index finger surgery  2007   spider bite  . IR RADIOLOGIST EVAL & MGMT  09/14/2020  . ORIF ANKLE FRACTURE Left 2542,7062  . Right knee surgery  1966   torn cartilage  . TONSILLECTOMY  1962   adenoidectomy  . TUBAL LIGATION Bilateral 1986    There were no vitals filed for this visit.   Subjective Assessment - 03/20/21 1103    Subjective Dull ache in the feet today, 1/10 back bothering (worse with walking). Got very tired standing for cooking for a long time which messed up sleep schedule past few days. Right shoulder has been bothering more as well as hands cramping up with the neuropathy.    Pertinent History Broke L ankle about 20 yrs ago with pin and screw placement x 2. had L4-5 facet joint and epidural abscess with guided aspiration October 2021. 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 Posey Pronto. Both hands go numb (chronic since ~2015). Arthritis. T1DM on insulin with peripheral neuropathy of hands and feet.    Patient Stated Goals to decrease pain, get more stable with walking    Currently in Pain? Yes    Pain Score 1     Pain Location Back    Pain Orientation Lower    Multiple  Pain Sites Yes    Pain Score 5    Pain Location Shoulder    Pain Orientation Right    Pain Descriptors / Indicators Aching              [facility] PT Assessment - 03/20/21 0001      AROM   Overall AROM Comments Grossly 4/5 B hips/knees      Berg Balance Test   Sit to Stand Able to stand without using hands and stabilize independently    Standing Unsupported Able to stand safely 2 minutes    Sitting with Back Unsupported but Feet Supported on Floor or Stool Able to sit safely and securely 2 minutes    Stand to Sit Sits safely with minimal use of hands    Transfers Able to transfer safely, minor use of hands    Standing Unsupported with Eyes Closed Able to stand 10 seconds with supervision    Standing Unsupported with Feet Together Able to place feet together independently and  stand for 1 minute with supervision    From Standing, Reach Forward with Outstretched Arm Can reach forward >12 cm safely (5")    From Standing Position, Pick up Object from Floor Able to pick up shoe, needs supervision    From Standing Position, Turn to Look Behind Over each Shoulder Looks behind one side only/other side shows less weight shift   less WS with LOB to the left, full to the right   Turn 360 Degrees Able to turn 360 degrees safely in 4 seconds or less   ** improved   Standing Unsupported, Alternately Place Feet on Step/Stool Able to stand independently and safely and complete 8 steps in 20 seconds   ** improved   Standing Unsupported, One Foot in Front Needs help to step but can hold 15 seconds   ** slight improvement   Standing on One Leg Tries to lift leg/unable to hold 3 seconds but remains standing independently    Total Score 45    Berg comment: initial 30/56 - improved to 45/56                         [facility] Adult PT Treatment/Exercise - 03/20/21 0001      Exercises   Exercises Shoulder;Wrist      Lumbar Exercises: Stretches   Other Lumbar Stretch Exercise pball rollouts lumbar stretch FF, L and R x 10 each    Other Lumbar Stretch Exercise Seated trunk rotation stretch x 2 with breathing B      Lumbar Exercises: Aerobic   Tread Mill 0.7 mph - 2 minutes      Lumbar Exercises: Standing   Wall Slides 10 reps    Shoulder Extension AROM;Both;10 reps   red TB with cues for scap retrction     Wrist Exercises   Other wrist exercises Wrist circles x 8 CW/[id number]. Prayer stretch 5" x 5         Sit <> stands x 10           PT Education - 03/20/21 1155    Education Details Educated in adding some wrist circles, prayer stretch, and wall slides for AA shoulder flexion    Person(s) Educated Patient    Methods Explanation;Demonstration    Comprehension Verbalized understanding;Returned demonstration            PT Short Term Goals - 03/20/21 1108       PT SHORT TERM GOAL #1  Title Independent with HEP    Time 2    Period Weeks    Status Achieved    Target Date 02/12/21             PT Long Term Goals - 03/20/21 1108      PT LONG TERM GOAL #1   Title Pt will demo improved berg to at least 50/56 to demo improved balance and decreased fall risk    Baseline initial 30/56. Reassessed 03/20/21: 45/56 initial LTG met.    Status On-going      PT LONG TERM GOAL #2   Title Pt will complete 5TSTS in no greater than 12 seconds to demo improved functional strength and balance    Status Achieved   11 sec     PT LONG TERM GOAL #3   Title Pt demo normalized gait pattern with minimal antalgic limp and increased symmetry of gait without LOB to facilitate safe negotiation of home an dcommunity environments.    Status On-going      PT LONG TERM GOAL #4   Title BLE strength at least 4+/5    Status On-going   Making excellent progress 4/5 BLE grossly     PT LONG TERM GOAL #5   Title Pt will report improved tolerance to prolonged standing for cooking with minimal low back and shoulder pain    Time 4    Period Weeks    Status New    Target Date 04/27/21                 Plan - 03/20/21 1130    Clinical Impression Statement Blenda has made excellent progress towards goals. She continues to demo falls risk although improved berg score from initial evaluation, most difficulty with narrow BOS. She also c/o LBP and shoulder pain, wrist/finger stiffness which can limit tolerance to functional mobility and actiivties.  She reports she would like to work on improving balance and stability a bit further. She will benefit from cotninued skilled physical therapy to work on decreasing pain and improving upon strength, balance and functional mobility further.    Personal Factors and Comorbidities Comorbidity 3+;Past/Current Experience;Time since onset of injury/illness/exacerbation    Stability/Clinical Decision Making Evolving/Moderate complexity     Rehab Potential Good    PT Frequency 1x / week   1-2x/wk   PT Duration --   5 weeks   PT Treatment/Interventions ADLs/Self Care Home Management;Cryotherapy;Iontophoresis 49m/ml Dexamethasone;Moist Heat;Gait training;Neuromuscular re-education;Balance training;Therapeutic exercise;Electrical Stimulation;Therapeutic activities;Functional mobility training;Stair training;Patient/family education;Manual techniques;Energy conservation;Taping;Vasopneumatic Device;Vestibular    PT Next Visit Plan Keep in mind neuropathy - limited functional grip strength with some numbness in hands. progressive strength and balance training as tolerated. manual and modalities as needed. Push balance challenges with narrower BOS, lumbar mobilitycore stab, incorporate UE strengthening    Consulted and Agree with Plan of Care Patient           Patient will benefit from skilled therapeutic intervention in order to improve the following deficits and impairments:  Abnormal gait,Difficulty walking,Increased muscle spasms,Decreased endurance,Decreased activity tolerance,Pain,Improper body mechanics,Impaired flexibility,Decreased balance,Decreased mobility,Decreased strength,Increased edema,Impaired sensation,Hypomobility  Visit Diagnosis: Muscle weakness (generalized) - Plan: PT plan of care cert/re-cert  Unsteadiness on feet - Plan: PT plan of care cert/re-cert  History of falling - Plan: PT plan of care cert/re-cert  Other abnormalities of gait and mobility - Plan: PT plan of care cert/re-cert  Muscle spasm of back - Plan: PT plan of care cert/re-cert  Dizziness and giddiness - Plan: PT  plan of care cert/re-cert  Right shoulder pain, unspecified chronicity - Plan: PT plan of care cert/re-cert  Unspecified disturbances of skin sensation - Plan: PT plan of care cert/re-cert  Stiffness of left wrist, not elsewhere classified - Plan: PT plan of care cert/re-cert  Stiffness of right wrist, not elsewhere classified -  Plan: PT plan of care cert/re-cert     Problem List Patient Active Problem List   Diagnosis Date Noted  . S/P amputation of foot, right ([place])   . Diabetic foot ulcer ([place]) 09/15/2020  . Ankle pain   . Epidural abscess   . Abscess in epidural space of lumbar spine 09/12/2020  . Obstructive apnea   . Sleep disturbance 08/17/2012  . History of mammogram 07/29/2012  . Chest pain 04/06/2012  . EDEMA 05/16/2008  . Morbid obesity ([place]) 04/13/2008  . CARPAL TUNNEL SYNDROME, BILATERAL 01/15/2008  . ALLERGIC RHINITIS, SEASONAL 01/06/2008  . ANXIETY DISORDER, GENERALIZED 05/01/2007  . PERIPHERAL NEUROPATHY 05/01/2007  . ENDOMETRIAL POLYP 05/01/2007  . HYPERPLASIA, ENDOMETRIAL NOS 05/01/2007  . Type 2 diabetes mellitus with hyperglycemia ([place]) 01/29/2007  . HYPERLIPIDEMIA 01/29/2007  . ANEMIA, OTHER, UNSPECIFIED 01/29/2007  . HYPERTENSION, BENIGN SYSTEMIC 01/29/2007    Hall Busing, PT, DPT 03/20/2021, 12:03 PM  [place]. [place], [place], [address] Phone: [phone]   Fax:  [phone]  Name: Brittany Silva MRN: [id number] Date of Birth: [birth year]

## 2021-03-28 ENCOUNTER — Other Ambulatory Visit: Payer: Self-pay

## 2021-03-28 ENCOUNTER — Ambulatory Visit: Payer: Medicare Other

## 2021-03-28 DIAGNOSIS — Z9181 History of falling: Secondary | ICD-10-CM

## 2021-03-28 DIAGNOSIS — M6281 Muscle weakness (generalized): Secondary | ICD-10-CM

## 2021-03-28 DIAGNOSIS — M25511 Pain in right shoulder: Secondary | ICD-10-CM

## 2021-03-28 DIAGNOSIS — M25632 Stiffness of left wrist, not elsewhere classified: Secondary | ICD-10-CM

## 2021-03-28 DIAGNOSIS — R2689 Other abnormalities of gait and mobility: Secondary | ICD-10-CM

## 2021-03-28 DIAGNOSIS — M62830 Muscle spasm of back: Secondary | ICD-10-CM

## 2021-03-28 DIAGNOSIS — M25631 Stiffness of right wrist, not elsewhere classified: Secondary | ICD-10-CM

## 2021-03-28 DIAGNOSIS — R2681 Unsteadiness on feet: Secondary | ICD-10-CM

## 2021-03-28 DIAGNOSIS — R209 Unspecified disturbances of skin sensation: Secondary | ICD-10-CM

## 2021-03-28 DIAGNOSIS — R42 Dizziness and giddiness: Secondary | ICD-10-CM

## 2021-03-28 NOTE — Therapy (Signed)
[place]. [place], [place], [address] Phone: [phone]   Fax:  [phone]  Physical Therapy Treatment  Patient Details  Name: Brittany Silva MRN: [id number] Date of Birth: 07/17/1956 Referring Provider (PT): Dawley, Theodoro Doing, DO neurology   Encounter Date: 03/28/2021   PT End of Session - 03/28/21 1452    Visit Number 14    Date for PT Re-Evaluation 04/27/21    Authorization Type medicare and UHC    PT Start Time 1448    PT Stop Time 1530    PT Time Calculation (min) 42 min    Activity Tolerance Patient tolerated treatment well    Behavior During Therapy [facility] for tasks assessed/performed           Past Medical History:  Diagnosis Date  . Ankle fracture    left  . Anxiety    chronic  . Cervical back pain with evidence of disc disease   . Diabetes mellitus   . Dyslipidemia   . Edema    lower extremity  . GERD (gastroesophageal reflux disease)   . Hyperlipidemia   . Hypertension   . Hypothyroid   . Obstructive apnea    Split study 05-22-12 - patient was too claustrophobic and did not persue CPAP.   Marland Kitchen Osteoarthritis   . PVC's (premature ventricular contractions)   . Retinopathy    diabetic, mild, nonproliferative bilateral  . TSH (thyroid-stimulating hormone deficiency)    evaluation off RX in 4/14  . Vitamin D deficiency     Past Surgical History:  Procedure Laterality Date  . ABDOMINAL HYSTERECTOMY    . AMPUTATION Left 09/21/2020   Procedure: AMPUTATION LEFT 4TH TOE;  Surgeon: Felipa Furnace, DPM;  Location: [facility];  Service: Podiatry;  Laterality: Left;  . APPLICATION OF A-CELL OF EXTREMITY Left 09/21/2020   Procedure: APPLICATION OF A-CELL, left foot;  Surgeon: Felipa Furnace, DPM;  Location: [facility];  Service: Podiatry;  Laterality: Left;  . CATARACT EXTRACTION Bilateral 2012  . EYE SURGERY Right 06/26/11   cataract removal Rt eye  . FOOT SURGERY     Ankle fracture  . index finger  surgery  2007   spider bite  . IR RADIOLOGIST EVAL & MGMT  09/14/2020  . ORIF ANKLE FRACTURE Left [id number]  . Right knee surgery  1966   torn cartilage  . TONSILLECTOMY  1962   adenoidectomy  . TUBAL LIGATION Bilateral 1986    There were no vitals filed for this visit.   Subjective Assessment - 03/28/21 1451    Subjective Has lost balance a couple of times but able to catch self. Been doing more physical activtiy, working in yard    Pertinent History Broke L ankle about 20 yrs ago with pin and screw placement x 2. had L4-5 facet joint and epidural abscess with guided aspiration October 2021. 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 Posey Pronto. Both hands go numb (chronic since ~2015). Arthritis. T1DM on insulin with peripheral neuropathy of hands and feet.    Patient Stated Goals to decrease pain, get more stable with walking    Currently in Pain? No/denies                 [facility] Adult PT Treatment/Exercise - 03/28/21 0001      High Level Balance   High Level Balance Activities Backward walking;Marching forwards   CGA to intermittent min A with LOB   High Level Balance  Comments Tandem walk and sidestep on airex in parallel bars. Stading alt step taps airex t 8" step CGA x 10 B. Seated balance reactions on dynadisc with ball toss, cone reach and place      Lumbar Exercises: Stretches   Other Lumbar Stretch Exercise pball rollouts lumbar stretch FF, L and R x 10 each      Lumbar Exercises: Aerobic   UBE (Upper Arm Bike) L2 x 2 min each      Knee/Hip Exercises: Standing   Walking with Sports Cord 30# x3 each way                    PT Short Term Goals - 03/20/21 1108      PT SHORT TERM GOAL #1   Title Independent with HEP    Time 2    Period Weeks    Status Achieved    Target Date 02/12/21             PT Long Term Goals - 03/20/21 1108      PT LONG TERM GOAL #1   Title Pt will demo improved berg to at least 50/56 to demo improved balance and decreased  fall risk    Baseline initial 30/56. Reassessed 03/20/21: 45/56 initial LTG met.    Status On-going      PT LONG TERM GOAL #2   Title Pt will complete 5TSTS in no greater than 12 seconds to demo improved functional strength and balance    Status Achieved   11 sec     PT LONG TERM GOAL #3   Title Pt demo normalized gait pattern with minimal antalgic limp and increased symmetry of gait without LOB to facilitate safe negotiation of home an dcommunity environments.    Status On-going      PT LONG TERM GOAL #4   Title BLE strength at least 4+/5    Status On-going   Making excellent progress 4/5 BLE grossly     PT LONG TERM GOAL #5   Title Pt will report improved tolerance to prolonged standing for cooking with minimal low back and shoulder pain    Time 4    Period Weeks    Status New    Target Date 04/27/21                 Plan - 03/28/21 1452    Clinical Impression Statement Keirah tolerated all exercises nicely today. Emphasis on balance challenges in both standing and sitting. Alot of dififuclty with forward march with lateral LOB when trying to move slower. Pt with postural core weakness, alot of trunk compensations with standing exercises so we tried some balance challenges in sitting which were tolerated nicely.  Plan to continue to push exercises to improve postural stability, balance    Personal Factors and Comorbidities Comorbidity 3+;Past/Current Experience;Time since onset of injury/illness/exacerbation    Stability/Clinical Decision Making Evolving/Moderate complexity    Rehab Potential Good    PT Frequency 1x / week   1-2x/wk   PT Duration --   5 weeks   PT Treatment/Interventions ADLs/Self Care Home Management;Cryotherapy;Iontophoresis 64m/ml Dexamethasone;Moist Heat;Gait training;Neuromuscular re-education;Balance training;Therapeutic exercise;Electrical Stimulation;Therapeutic activities;Functional mobility training;Stair training;Patient/family education;Manual  techniques;Energy conservation;Taping;Vasopneumatic Device;Vestibular    PT Next Visit Plan Keep in mind neuropathy - limited functional grip strength with some numbness in hands. progressive strength and balance training as tolerated. manual and modalities as needed. Push balance challenges with narrower BOS, lumbar mobilitycore stab, incorporate UE strengthening  Consulted and Agree with Plan of Care Patient           Patient will benefit from skilled therapeutic intervention in order to improve the following deficits and impairments:  Abnormal gait,Difficulty walking,Increased muscle spasms,Decreased endurance,Decreased activity tolerance,Pain,Improper body mechanics,Impaired flexibility,Decreased balance,Decreased mobility,Decreased strength,Increased edema,Impaired sensation,Hypomobility  Visit Diagnosis: Muscle weakness (generalized)  Unsteadiness on feet  History of falling  Other abnormalities of gait and mobility  Muscle spasm of back  Dizziness and giddiness  Right shoulder pain, unspecified chronicity  Unspecified disturbances of skin sensation  Stiffness of left wrist, not elsewhere classified  Stiffness of right wrist, not elsewhere classified     Problem List Patient Active Problem List   Diagnosis Date Noted  . S/P amputation of foot, right ([place])   . Diabetic foot ulcer ([place]) 09/15/2020  . Ankle pain   . Epidural abscess   . Abscess in epidural space of lumbar spine 09/12/2020  . Obstructive apnea   . Sleep disturbance 08/17/2012  . History of mammogram 07/29/2012  . Chest pain 04/06/2012  . EDEMA 05/16/2008  . Morbid obesity ([place]) 04/13/2008  . CARPAL TUNNEL SYNDROME, BILATERAL 01/15/2008  . ALLERGIC RHINITIS, SEASONAL 01/06/2008  . ANXIETY DISORDER, GENERALIZED 05/01/2007  . PERIPHERAL NEUROPATHY 05/01/2007  . ENDOMETRIAL POLYP 05/01/2007  . HYPERPLASIA, ENDOMETRIAL NOS 05/01/2007  . Type 2 diabetes mellitus with hyperglycemia ([place]) 01/29/2007   . HYPERLIPIDEMIA 01/29/2007  . ANEMIA, OTHER, UNSPECIFIED 01/29/2007  . HYPERTENSION, BENIGN SYSTEMIC 01/29/2007    Hall Busing, PT, DPT 03/28/2021, 3:38 PM  [place]. [place], [place], [address] Phone: [phone]   Fax:  [phone]  Name: Brittany Silva MRN: [id number] Date of Birth: 11/01/1956

## 2021-04-02 ENCOUNTER — Ambulatory Visit: Payer: Medicare Other | Attending: Neurological Surgery | Admitting: Physical Therapy

## 2021-04-02 ENCOUNTER — Other Ambulatory Visit: Payer: Self-pay

## 2021-04-02 DIAGNOSIS — M25631 Stiffness of right wrist, not elsewhere classified: Secondary | ICD-10-CM | POA: Diagnosis present

## 2021-04-02 DIAGNOSIS — R2681 Unsteadiness on feet: Secondary | ICD-10-CM | POA: Insufficient documentation

## 2021-04-02 DIAGNOSIS — R209 Unspecified disturbances of skin sensation: Secondary | ICD-10-CM | POA: Insufficient documentation

## 2021-04-02 DIAGNOSIS — M25632 Stiffness of left wrist, not elsewhere classified: Secondary | ICD-10-CM | POA: Diagnosis present

## 2021-04-02 DIAGNOSIS — R42 Dizziness and giddiness: Secondary | ICD-10-CM | POA: Diagnosis present

## 2021-04-02 DIAGNOSIS — Z9181 History of falling: Secondary | ICD-10-CM | POA: Insufficient documentation

## 2021-04-02 DIAGNOSIS — M6281 Muscle weakness (generalized): Secondary | ICD-10-CM | POA: Diagnosis not present

## 2021-04-02 DIAGNOSIS — M62830 Muscle spasm of back: Secondary | ICD-10-CM | POA: Diagnosis present

## 2021-04-02 DIAGNOSIS — M25511 Pain in right shoulder: Secondary | ICD-10-CM | POA: Diagnosis present

## 2021-04-02 DIAGNOSIS — R2689 Other abnormalities of gait and mobility: Secondary | ICD-10-CM | POA: Insufficient documentation

## 2021-04-02 NOTE — Therapy (Signed)
[place]. [place], [place], [address] Phone: [phone]   Fax:  [phone]  Physical Therapy Treatment  Patient Details  Name: Brittany Silva MRN: [id number] Date of Birth: 22-Aug-1956 Referring Provider (PT): Dawley, Theodoro Doing, DO neurology   Encounter Date: 04/02/2021   PT End of Session - 04/02/21 1510    Visit Number 15    Number of Visits 17    Date for PT Re-Evaluation 04/27/21    PT Start Time 7414    PT Stop Time 1505    PT Time Calculation (min) 45 min    Activity Tolerance Patient tolerated treatment well    Behavior During Therapy [facility] for tasks assessed/performed           Past Medical History:  Diagnosis Date  . Ankle fracture    left  . Anxiety    chronic  . Cervical back pain with evidence of disc disease   . Diabetes mellitus   . Dyslipidemia   . Edema    lower extremity  . GERD (gastroesophageal reflux disease)   . Hyperlipidemia   . Hypertension   . Hypothyroid   . Obstructive apnea    Split study 05-22-12 - patient was too claustrophobic and did not persue CPAP.   Marland Kitchen Osteoarthritis   . PVC's (premature ventricular contractions)   . Retinopathy    diabetic, mild, nonproliferative bilateral  . TSH (thyroid-stimulating hormone deficiency)    evaluation off RX in 4/14  . Vitamin D deficiency     Past Surgical History:  Procedure Laterality Date  . ABDOMINAL HYSTERECTOMY    . AMPUTATION Left 09/21/2020   Procedure: AMPUTATION LEFT 4TH TOE;  Surgeon: Felipa Furnace, DPM;  Location: [place];  Service: Podiatry;  Laterality: Left;  . APPLICATION OF A-CELL OF EXTREMITY Left 09/21/2020   Procedure: APPLICATION OF A-CELL, left foot;  Surgeon: Felipa Furnace, DPM;  Location: [place];  Service: Podiatry;  Laterality: Left;  . CATARACT EXTRACTION Bilateral 2012  . EYE SURGERY Right 06/26/11   cataract removal Rt eye  . FOOT SURGERY     Ankle fracture  . index finger surgery  2007   spider  bite  . IR RADIOLOGIST EVAL & MGMT  09/14/2020  . ORIF ANKLE FRACTURE Left 2395,3202  . Right knee surgery  1966   torn cartilage  . TONSILLECTOMY  1962   adenoidectomy  . TUBAL LIGATION Bilateral 1986    There were no vitals filed for this visit.   Subjective Assessment - 04/02/21 1423    Subjective Pt reports that she is doing good, no c/o of pain, no reports of any falls or LOB.    Currently in Pain? Yes    Pain Score 1     Pain Location Back    Pain Orientation Lower                             [facility] Adult PT Treatment/Exercise - 04/02/21 0001      High Level Balance   High Level Balance Comments Tandem walking fwd/bwd and sidestep on airex in parallel bars.      Lumbar Exercises: Stretches   Other Lumbar Stretch Exercise pball rollouts lumbar stretch FF, x 10      Knee/Hip Exercises: Machines for Strengthening   Cybex Knee Extension 10# 2x10    Cybex Knee Flexion 20# 2x10      Knee/Hip  Exercises: Standing   Forward Step Up Both;10 reps;Step Height: 8"   from airex,   Walking with Sports Cord 30# x 4 way x5    Other Standing Knee Exercises RLE step up, HHx0, x20      Knee/Hip Exercises: Seated   Sit to Sand 15 reps;without UE support;1 set   last 5, LLE out front to emphasize RLE, elevated mat table                   PT Short Term Goals - 03/20/21 1108      PT SHORT TERM GOAL #1   Title Independent with HEP    Time 2    Period Weeks    Status Achieved    Target Date 02/12/21             PT Long Term Goals - 03/20/21 1108      PT LONG TERM GOAL #1   Title Pt will demo improved berg to at least 50/56 to demo improved balance and decreased fall risk    Baseline initial 30/56. Reassessed 03/20/21: 45/56 initial LTG met.    Status On-going      PT LONG TERM GOAL #2   Title Pt will complete 5TSTS in no greater than 12 seconds to demo improved functional strength and balance    Status Achieved   11 sec     PT LONG TERM GOAL  #3   Title Pt demo normalized gait pattern with minimal antalgic limp and increased symmetry of gait without LOB to facilitate safe negotiation of home an dcommunity environments.    Status On-going      PT LONG TERM GOAL #4   Title BLE strength at least 4+/5    Status On-going   Making excellent progress 4/5 BLE grossly     PT LONG TERM GOAL #5   Title Pt will report improved tolerance to prolonged standing for cooking with minimal low back and shoulder pain    Time 4    Period Weeks    Status New    Target Date 04/27/21                 Plan - 04/02/21 1510    Clinical Impression Statement Pt tolerated treatment well today. RLE weakness noted with step ups with use of HHx2 and during STS with RLE focus with postural sway and LOB. Some posterior postural sawy noted with side stepping but ability to self correct. She tolerated progression of LE ther ex fair with ability to complete all interventions on the machines.    PT Treatment/Interventions ADLs/Self Care Home Management;Cryotherapy;Iontophoresis 41m/ml Dexamethasone;Moist Heat;Gait training;Neuromuscular re-education;Balance training;Therapeutic exercise;Electrical Stimulation;Therapeutic activities;Functional mobility training;Stair training;Patient/family education;Manual techniques;Energy conservation;Taping;Vasopneumatic Device;Vestibular    PT Next Visit Plan Continue functional strength and balance exercises as tolerated and indicated.           Patient will benefit from skilled therapeutic intervention in order to improve the following deficits and impairments:  Abnormal gait,Difficulty walking,Increased muscle spasms,Decreased endurance,Decreased activity tolerance,Pain,Improper body mechanics,Impaired flexibility,Decreased balance,Decreased mobility,Decreased strength,Increased edema,Impaired sensation,Hypomobility  Visit Diagnosis: Muscle weakness (generalized)  Unsteadiness on feet  History of falling  Other  abnormalities of gait and mobility  Muscle spasm of back  Unspecified disturbances of skin sensation     Problem List Patient Active Problem List   Diagnosis Date Noted  . S/P amputation of foot, right ([place]   . Diabetic foot ulcer ([place] 09/15/2020  . Ankle pain   . Epidural abscess   .  Abscess in epidural space of lumbar spine 09/12/2020  . Obstructive apnea   . Sleep disturbance 08/17/2012  . History of mammogram 07/29/2012  . Chest pain 04/06/2012  . EDEMA 05/16/2008  . Morbid obesity ([place]) 04/13/2008  . CARPAL TUNNEL SYNDROME, BILATERAL 01/15/2008  . ALLERGIC RHINITIS, SEASONAL 01/06/2008  . ANXIETY DISORDER, GENERALIZED 05/01/2007  . PERIPHERAL NEUROPATHY 05/01/2007  . ENDOMETRIAL POLYP 05/01/2007  . HYPERPLASIA, ENDOMETRIAL NOS 05/01/2007  . Type 2 diabetes mellitus with hyperglycemia ([place]) 01/29/2007  . HYPERLIPIDEMIA 01/29/2007  . ANEMIA, OTHER, UNSPECIFIED 01/29/2007  . HYPERTENSION, BENIGN SYSTEMIC 01/29/2007    Ernst Spell, SPTA 04/02/2021, 3:17 PM  [place]. [place], [place], [address] Phone: [phone]   Fax:  [phone]  Name: Brittany Silva MRN: [id number] Date of Birth: 01-30-1956

## 2021-04-06 ENCOUNTER — Ambulatory Visit: Payer: Medicare Other

## 2021-04-10 ENCOUNTER — Ambulatory Visit: Payer: Medicare Other

## 2021-04-10 ENCOUNTER — Other Ambulatory Visit: Payer: Self-pay

## 2021-04-10 DIAGNOSIS — R42 Dizziness and giddiness: Secondary | ICD-10-CM

## 2021-04-10 DIAGNOSIS — R209 Unspecified disturbances of skin sensation: Secondary | ICD-10-CM

## 2021-04-10 DIAGNOSIS — R2681 Unsteadiness on feet: Secondary | ICD-10-CM

## 2021-04-10 DIAGNOSIS — M25631 Stiffness of right wrist, not elsewhere classified: Secondary | ICD-10-CM

## 2021-04-10 DIAGNOSIS — M6281 Muscle weakness (generalized): Secondary | ICD-10-CM | POA: Diagnosis not present

## 2021-04-10 DIAGNOSIS — M25632 Stiffness of left wrist, not elsewhere classified: Secondary | ICD-10-CM

## 2021-04-10 DIAGNOSIS — Z9181 History of falling: Secondary | ICD-10-CM

## 2021-04-10 DIAGNOSIS — M25511 Pain in right shoulder: Secondary | ICD-10-CM

## 2021-04-10 DIAGNOSIS — R2689 Other abnormalities of gait and mobility: Secondary | ICD-10-CM

## 2021-04-10 DIAGNOSIS — M62830 Muscle spasm of back: Secondary | ICD-10-CM

## 2021-04-10 NOTE — Therapy (Signed)
[place]. [place], [place], [address] Phone: [phone]   Fax:  [phone]  Physical Therapy Treatment  Patient Details  Name: Brittany Silva MRN: [id number] Date of Birth: 12-18-1955 Referring Provider (PT): Dawley, Theodoro Doing, DO neurology   Encounter Date: 04/10/2021   PT End of Session - 04/10/21 1415    Visit Number 16    Date for PT Re-Evaluation 04/27/21    PT Start Time 4562    PT Stop Time 1445    PT Time Calculation (min) 32 min    Activity Tolerance Patient tolerated treatment well    Behavior During Therapy [facility] for tasks assessed/performed           Past Medical History:  Diagnosis Date  . Ankle fracture    left  . Anxiety    chronic  . Cervical back pain with evidence of disc disease   . Diabetes mellitus   . Dyslipidemia   . Edema    lower extremity  . GERD (gastroesophageal reflux disease)   . Hyperlipidemia   . Hypertension   . Hypothyroid   . Obstructive apnea    Split study 05-22-12 - patient was too claustrophobic and did not persue CPAP.   Marland Kitchen Osteoarthritis   . PVC's (premature ventricular contractions)   . Retinopathy    diabetic, mild, nonproliferative bilateral  . TSH (thyroid-stimulating hormone deficiency)    evaluation off RX in 4/14  . Vitamin D deficiency     Past Surgical History:  Procedure Laterality Date  . ABDOMINAL HYSTERECTOMY    . AMPUTATION Left 09/21/2020   Procedure: AMPUTATION LEFT 4TH TOE;  Surgeon: Felipa Furnace, DPM;  Location: [place];  Service: Podiatry;  Laterality: Left;  . APPLICATION OF A-CELL OF EXTREMITY Left 09/21/2020   Procedure: APPLICATION OF A-CELL, left foot;  Surgeon: Felipa Furnace, DPM;  Location: [place];  Service: Podiatry;  Laterality: Left;  . CATARACT EXTRACTION Bilateral 2012  . EYE SURGERY Right 06/26/11   cataract removal Rt eye  . FOOT SURGERY     Ankle fracture  . index finger surgery  2007   spider bite  . IR  RADIOLOGIST EVAL & MGMT  09/14/2020  . ORIF ANKLE FRACTURE Left 5638,9373  . Right knee surgery  1966   torn cartilage  . TONSILLECTOMY  1962   adenoidectomy  . TUBAL LIGATION Bilateral 1986    There were no vitals filed for this visit.   Subjective Assessment - 04/10/21 1415    Subjective lifted something last week and strained shoulder    Patient Stated Goals to decrease pain, get more stable with walking    Currently in Pain? Yes    Pain Score 3     Pain Location Shoulder    Pain Orientation Right                             [facility] Adult PT Treatment/Exercise - 04/10/21 0001      High Level Balance   High Level Balance Comments Tandem walking fwd/bwd and sidestep on airex in parallel bars.      Lumbar Exercises: Stretches   Other Lumbar Stretch Exercise forward lumbar stretch without ball , x 10      Knee/Hip Exercises: Standing   Knee Flexion Both;Strengthening;10 reps;2 sets    Hip Flexion Both;Stengthening;2 sets;10 reps    Hip Abduction Stengthening;Both;2 sets;10 reps  Forward Step Up Both;10 reps;Step Height: 8"   from airex,           STS from high mat on ariex x 10         PT Short Term Goals - 03/20/21 1108      PT SHORT TERM GOAL #1   Title Independent with HEP    Time 2    Period Weeks    Status Achieved    Target Date 02/12/21             PT Long Term Goals - 03/20/21 1108      PT LONG TERM GOAL #1   Title Pt will demo improved berg to at least 50/56 to demo improved balance and decreased fall risk    Baseline initial 30/56. Reassessed 03/20/21: 45/56 initial LTG met.    Status On-going      PT LONG TERM GOAL #2   Title Pt will complete 5TSTS in no greater than 12 seconds to demo improved functional strength and balance    Status Achieved   11 sec     PT LONG TERM GOAL #3   Title Pt demo normalized gait pattern with minimal antalgic limp and increased symmetry of gait without LOB to facilitate safe negotiation  of home an dcommunity environments.    Status On-going      PT LONG TERM GOAL #4   Title BLE strength at least 4+/5    Status On-going   Making excellent progress 4/5 BLE grossly     PT LONG TERM GOAL #5   Title Pt will report improved tolerance to prolonged standing for cooking with minimal low back and shoulder pain    Time 4    Period Weeks    Status New    Target Date 04/27/21                 Plan - 04/10/21 1419    Clinical Impression Statement Tolerated treatment well. Avoided exercises with too much UE strain d/t incr R shoulder pain after straining it with lifting/carrying mulch bags at home, some painful ROM especially with reaching out and when putting weight on arm - advised pt follow up with MD about this. Continues practice of step ups with HHA x 1-2 with some instability that worsens with fatigue. Otherwise tolerates progression of TE well and reports feeling like therapy is helping with stability.    PT Frequency --   1-2x/wk   PT Treatment/Interventions ADLs/Self Care Home Management;Cryotherapy;Iontophoresis 11m/ml Dexamethasone;Moist Heat;Gait training;Neuromuscular re-education;Balance training;Therapeutic exercise;Electrical Stimulation;Therapeutic activities;Functional mobility training;Stair training;Patient/family education;Manual techniques;Energy conservation;Taping;Vasopneumatic Device;Vestibular    PT Next Visit Plan Continue functional strength and balance exercises as tolerated and indicated.    Consulted and Agree with Plan of Care Patient           Patient will benefit from skilled therapeutic intervention in order to improve the following deficits and impairments:  Abnormal gait,Difficulty walking,Increased muscle spasms,Decreased endurance,Decreased activity tolerance,Pain,Improper body mechanics,Impaired flexibility,Decreased balance,Decreased mobility,Decreased strength,Increased edema,Impaired sensation,Hypomobility  Visit Diagnosis: Muscle  weakness (generalized)  Unsteadiness on feet  History of falling  Other abnormalities of gait and mobility  Muscle spasm of back  Dizziness and giddiness  Unspecified disturbances of skin sensation  Right shoulder pain, unspecified chronicity  Stiffness of left wrist, not elsewhere classified  Stiffness of right wrist, not elsewhere classified     Problem List Patient Active Problem List   Diagnosis Date Noted  . S/P amputation of foot, right ([facility]   .  Diabetic foot ulcer ([place]) 09/15/2020  . Ankle pain   . Epidural abscess   . Abscess in epidural space of lumbar spine 09/12/2020  . Obstructive apnea   . Sleep disturbance 08/17/2012  . History of mammogram 07/29/2012  . Chest pain 04/06/2012  . EDEMA 05/16/2008  . Morbid obesity ([place]) 04/13/2008  . CARPAL TUNNEL SYNDROME, BILATERAL 01/15/2008  . ALLERGIC RHINITIS, SEASONAL 01/06/2008  . ANXIETY DISORDER, GENERALIZED 05/01/2007  . PERIPHERAL NEUROPATHY 05/01/2007  . ENDOMETRIAL POLYP 05/01/2007  . HYPERPLASIA, ENDOMETRIAL NOS 05/01/2007  . Type 2 diabetes mellitus with hyperglycemia ([place]) 01/29/2007  . HYPERLIPIDEMIA 01/29/2007  . ANEMIA, OTHER, UNSPECIFIED 01/29/2007  . HYPERTENSION, BENIGN SYSTEMIC 01/29/2007    Hall Busing, PT, DPT 04/10/2021, 2:50 PM  [place]. [place], [place], [address] Phone: [phone]   Fax:  [phone]  Name: KELITA WALLIS MRN: [id number] Date of Birth: 01-04-1956

## 2021-04-18 ENCOUNTER — Other Ambulatory Visit: Payer: Self-pay

## 2021-04-18 ENCOUNTER — Ambulatory Visit: Payer: Medicare Other

## 2021-04-18 DIAGNOSIS — M62830 Muscle spasm of back: Secondary | ICD-10-CM

## 2021-04-18 DIAGNOSIS — Z9181 History of falling: Secondary | ICD-10-CM

## 2021-04-18 DIAGNOSIS — M6281 Muscle weakness (generalized): Secondary | ICD-10-CM | POA: Diagnosis not present

## 2021-04-18 DIAGNOSIS — M25511 Pain in right shoulder: Secondary | ICD-10-CM

## 2021-04-18 DIAGNOSIS — M25632 Stiffness of left wrist, not elsewhere classified: Secondary | ICD-10-CM

## 2021-04-18 DIAGNOSIS — R2689 Other abnormalities of gait and mobility: Secondary | ICD-10-CM

## 2021-04-18 DIAGNOSIS — R42 Dizziness and giddiness: Secondary | ICD-10-CM

## 2021-04-18 DIAGNOSIS — R209 Unspecified disturbances of skin sensation: Secondary | ICD-10-CM

## 2021-04-18 DIAGNOSIS — R2681 Unsteadiness on feet: Secondary | ICD-10-CM

## 2021-04-18 DIAGNOSIS — M25631 Stiffness of right wrist, not elsewhere classified: Secondary | ICD-10-CM

## 2021-04-18 NOTE — Therapy (Signed)
[place]. [place], [place], [address] Phone: [phone]   Fax:  [phone]  Physical Therapy Treatment  Patient Details  Name: Brittany Silva MRN: [id number] Date of Birth: [birth year] Referring Provider (PT): Dawley, Theodoro Doing, DO neurology   Encounter Date: 04/18/2021   PT End of Session - 04/18/21 1104    Visit Number 17    Date for PT Re-Evaluation 04/27/21    PT Start Time 1100    PT Stop Time 1140    PT Time Calculation (min) 40 min    Activity Tolerance Patient tolerated treatment well    Behavior During Therapy [facility] for tasks assessed/performed           Past Medical History:  Diagnosis Date  . Ankle fracture    left  . Anxiety    chronic  . Cervical back pain with evidence of disc disease   . Diabetes mellitus   . Dyslipidemia   . Edema    lower extremity  . GERD (gastroesophageal reflux disease)   . Hyperlipidemia   . Hypertension   . Hypothyroid   . Obstructive apnea    Split study 05-22-12 - patient was too claustrophobic and did not persue CPAP.   Marland Kitchen Osteoarthritis   . PVC's (premature ventricular contractions)   . Retinopathy    diabetic, mild, nonproliferative bilateral  . TSH (thyroid-stimulating hormone deficiency)    evaluation off RX in 4/14  . Vitamin D deficiency     Past Surgical History:  Procedure Laterality Date  . ABDOMINAL HYSTERECTOMY    . AMPUTATION Left 09/21/2020   Procedure: AMPUTATION LEFT 4TH TOE;  Surgeon: Felipa Furnace, DPM;  Location: [place];  Service: Podiatry;  Laterality: Left;  . APPLICATION OF A-CELL OF EXTREMITY Left 09/21/2020   Procedure: APPLICATION OF A-CELL, left foot;  Surgeon: Felipa Furnace, DPM;  Location: [place];  Service: Podiatry;  Laterality: Left;  . CATARACT EXTRACTION Bilateral 2012  . EYE SURGERY Right 06/26/11   cataract removal Rt eye  . FOOT SURGERY     Ankle fracture  . index finger surgery  2007   spider bite  . IR  RADIOLOGIST EVAL & MGMT  09/14/2020  . ORIF ANKLE FRACTURE Left 0940,7680  . Right knee surgery  1966   torn cartilage  . TONSILLECTOMY  1962   adenoidectomy  . TUBAL LIGATION Bilateral 1986    There were no vitals filed for this visit.   Subjective Assessment - 04/18/21 1103    Subjective Doing okay. Shoulder is better    Patient Stated Goals to decrease pain, get more stable with walking    Currently in Pain? No/denies                             [facility] Adult PT Treatment/Exercise - 04/18/21 0001      Lumbar Exercises: Aerobic   Nustep L5x6 min      Knee/Hip Exercises: Machines for Strengthening   Cybex Leg Press 30lb 3x10      Knee/Hip Exercises: Standing   Forward Step Up Both;10 reps;Step Height: 8"   no airex today d/t unsteadiness with shoes. 1 UE support - some compensations - increased difficulty and instability with step up on the right reqiring CGA/min A   Walking with Sports Cord 30# x4 each way  - seated rest after doing fwd/bwd before lateral stepping  Staggered STS x 5 B          PT Short Term Goals - 03/20/21 1108      PT SHORT TERM GOAL #1   Title Independent with HEP    Time 2    Period Weeks    Status Achieved    Target Date 02/12/21             PT Long Term Goals - 03/20/21 1108      PT LONG TERM GOAL #1   Title Pt will demo improved berg to at least 50/56 to demo improved balance and decreased fall risk    Baseline initial 30/56. Reassessed 03/20/21: 45/56 initial LTG met.    Status On-going      PT LONG TERM GOAL #2   Title Pt will complete 5TSTS in no greater than 12 seconds to demo improved functional strength and balance    Status Achieved   11 sec     PT LONG TERM GOAL #3   Title Pt demo normalized gait pattern with minimal antalgic limp and increased symmetry of gait without LOB to facilitate safe negotiation of home an dcommunity environments.    Status On-going      PT LONG TERM GOAL #4   Title  BLE strength at least 4+/5    Status On-going   Making excellent progress 4/5 BLE grossly     PT LONG TERM GOAL #5   Title Pt will report improved tolerance to prolonged standing for cooking with minimal low back and shoulder pain    Time 4    Period Weeks    Status New    Target Date 04/27/21                 Plan - 04/18/21 1104    Clinical Impression Statement Tolerated all exercises fairly without c/o increased pain. Continue to push balance challenges but does demo increased fatigue and needed some more rest breaks. Diffiuclty with step ups today with instability with step up on the right more than the left in addition to quicker fatigue requiring CGA/min A. Reinforced HEP and discussed adding staggered STS with UE support for home to continue to work SL strength and stabiltiy at home    PT Frequency --   1-2x/wk   PT Treatment/Interventions ADLs/Self Care Home Management;Cryotherapy;Iontophoresis 2m/ml Dexamethasone;Moist Heat;Gait training;Neuromuscular re-education;Balance training;Therapeutic exercise;Electrical Stimulation;Therapeutic activities;Functional mobility training;Stair training;Patient/family education;Manual techniques;Energy conservation;Taping;Vasopneumatic Device;Vestibular    PT Next Visit Plan Continue functional strength and balance exercises as tolerated and indicated.    Consulted and Agree with Plan of Care Patient           Patient will benefit from skilled therapeutic intervention in order to improve the following deficits and impairments:  Abnormal gait,Difficulty walking,Increased muscle spasms,Decreased endurance,Decreased activity tolerance,Pain,Improper body mechanics,Impaired flexibility,Decreased balance,Decreased mobility,Decreased strength,Increased edema,Impaired sensation,Hypomobility  Visit Diagnosis: Muscle weakness (generalized)  Unsteadiness on feet  History of falling  Other abnormalities of gait and mobility  Muscle spasm of  back  Dizziness and giddiness  Unspecified disturbances of skin sensation  Right shoulder pain, unspecified chronicity  Stiffness of left wrist, not elsewhere classified  Stiffness of right wrist, not elsewhere classified     Problem List Patient Active Problem List   Diagnosis Date Noted  . S/P amputation of foot, right ([facility]   . Diabetic foot ulcer ([facility] 09/15/2020  . Ankle pain   . Epidural abscess   . Abscess in epidural space of lumbar spine 09/12/2020  .  Obstructive apnea   . Sleep disturbance 08/17/2012  . History of mammogram 07/29/2012  . Chest pain 04/06/2012  . EDEMA 05/16/2008  . Morbid obesity ([place]) 04/13/2008  . CARPAL TUNNEL SYNDROME, BILATERAL 01/15/2008  . ALLERGIC RHINITIS, SEASONAL 01/06/2008  . ANXIETY DISORDER, GENERALIZED 05/01/2007  . PERIPHERAL NEUROPATHY 05/01/2007  . ENDOMETRIAL POLYP 05/01/2007  . HYPERPLASIA, ENDOMETRIAL NOS 05/01/2007  . Type 2 diabetes mellitus with hyperglycemia ([place]) 01/29/2007  . HYPERLIPIDEMIA 01/29/2007  . ANEMIA, OTHER, UNSPECIFIED 01/29/2007  . HYPERTENSION, BENIGN SYSTEMIC 01/29/2007    Hall Busing , PT, DPT 04/18/2021, 11:51 AM  [place] [place], [place], [address] Phone: [phone]   Fax:  [phone]  Name: Brittany Silva MRN: [id number] Date of Birth: 11/27/1956

## 2021-04-24 ENCOUNTER — Ambulatory Visit: Payer: Medicare Other

## 2021-04-26 ENCOUNTER — Other Ambulatory Visit: Payer: Self-pay

## 2021-04-26 ENCOUNTER — Ambulatory Visit: Payer: Medicare Other | Admitting: Physical Therapy

## 2021-04-26 ENCOUNTER — Encounter: Payer: Self-pay | Admitting: Physical Therapy

## 2021-04-26 DIAGNOSIS — R2689 Other abnormalities of gait and mobility: Secondary | ICD-10-CM

## 2021-04-26 DIAGNOSIS — M6281 Muscle weakness (generalized): Secondary | ICD-10-CM

## 2021-04-26 DIAGNOSIS — R2681 Unsteadiness on feet: Secondary | ICD-10-CM

## 2021-04-26 DIAGNOSIS — Z9181 History of falling: Secondary | ICD-10-CM

## 2021-04-26 NOTE — Therapy (Signed)
[place] [place], [place], [address] Phone: [phone]   Fax:  [phone]  Physical Therapy Treatment  Patient Details  Name: Brittany Silva MRN: [id number] Date of Birth: [birth year] Referring Provider (PT): Dawley, Theodoro Doing, DO neurology   Encounter Date: 04/26/2021   PT End of Session - 04/26/21 1341    Visit Number 18    Date for PT Re-Evaluation 04/27/21    Authorization Type medicare and UHC    PT Start Time 1300    PT Stop Time 1343    PT Time Calculation (min) 43 min    Activity Tolerance Patient tolerated treatment well    Behavior During Therapy [facility] for tasks assessed/performed           Past Medical History:  Diagnosis Date  . Ankle fracture    left  . Anxiety    chronic  . Cervical back pain with evidence of disc disease   . Diabetes mellitus   . Dyslipidemia   . Edema    lower extremity  . GERD (gastroesophageal reflux disease)   . Hyperlipidemia   . Hypertension   . Hypothyroid   . Obstructive apnea    Split study 05-22-12 - patient was too claustrophobic and did not persue CPAP.   Marland Kitchen Osteoarthritis   . PVC's (premature ventricular contractions)   . Retinopathy    diabetic, mild, nonproliferative bilateral  . TSH (thyroid-stimulating hormone deficiency)    evaluation off RX in 4/14  . Vitamin D deficiency     Past Surgical History:  Procedure Laterality Date  . ABDOMINAL HYSTERECTOMY    . AMPUTATION Left 09/21/2020   Procedure: AMPUTATION LEFT 4TH TOE;  Surgeon: Felipa Furnace, DPM;  Location: [place];  Service: Podiatry;  Laterality: Left;  . APPLICATION OF A-CELL OF EXTREMITY Left 09/21/2020   Procedure: APPLICATION OF A-CELL, left foot;  Surgeon: Felipa Furnace, DPM;  Location: [place];  Service: Podiatry;  Laterality: Left;  . CATARACT EXTRACTION Bilateral 2012  . EYE SURGERY Right 06/26/11   cataract removal Rt eye  . FOOT SURGERY     Ankle fracture  . index finger  surgery  2007   spider bite  . IR RADIOLOGIST EVAL & MGMT  09/14/2020  . ORIF ANKLE FRACTURE Left 2993,7169  . Right knee surgery  1966   torn cartilage  . TONSILLECTOMY  1962   adenoidectomy  . TUBAL LIGATION Bilateral 1986    There were no vitals filed for this visit.   Subjective Assessment - 04/26/21 1302    Subjective Doing ok, shoulder has its moments but all in all its doing better    Currently in Pain? No/denies                             [facility] Adult PT Treatment/Exercise - 04/26/21 0001      High Level Balance   High Level Balance Activities Tandem walking;Side stepping   SLS, all in parallel bars     Lumbar Exercises: Aerobic   Nustep L5x6 min      Knee/Hip Exercises: Machines for Strengthening   Cybex Leg Press 30lb 3x10      Knee/Hip Exercises: Standing   Forward Step Up Both;1 set;10 reps;Hand Hold: 1;Step Height: 6"   Heavy UE use with RLE   Walking with Sports Cord 30# x4 each way    Other Standing Knee Exercises Alt 6''  box taps x10    Other Standing Knee Exercises on airex alt 6'' box taps 2x10                    PT Short Term Goals - 03/20/21 1108      PT SHORT TERM GOAL #1   Title Independent with HEP    Time 2    Period Weeks    Status Achieved    Target Date 02/12/21             PT Long Term Goals - 03/20/21 1108      PT LONG TERM GOAL #1   Title Pt will demo improved berg to at least 50/56 to demo improved balance and decreased fall risk    Baseline initial 30/56. Reassessed 03/20/21: 45/56 initial LTG met.    Status On-going      PT LONG TERM GOAL #2   Title Pt will complete 5TSTS in no greater than 12 seconds to demo improved functional strength and balance    Status Achieved   11 sec     PT LONG TERM GOAL #3   Title Pt demo normalized gait pattern with minimal antalgic limp and increased symmetry of gait without LOB to facilitate safe negotiation of home an dcommunity environments.    Status On-going       PT LONG TERM GOAL #4   Title BLE strength at least 4+/5    Status On-going   Making excellent progress 4/5 BLE grossly     PT LONG TERM GOAL #5   Title Pt will report improved tolerance to prolonged standing for cooking with minimal low back and shoulder pain    Time 4    Period Weeks    Status New    Target Date 04/27/21                 Plan - 04/26/21 1342    Clinical Impression Statement Pt reports improvement overall with increase LE strength and better balance. Reports being able to stand I a few lines recently. Weakness present with step ups R>L needing heavy UE support to step up. Good stability with resisted gait but reports LE fatigue. Some instability noted with alt box taps while standing on airex    Personal Factors and Comorbidities Comorbidity 3+;Past/Current Experience;Time since onset of injury/illness/exacerbation    Examination-Activity Limitations Locomotion Level;Transfers;Stairs;Squat    Stability/Clinical Decision Making Evolving/Moderate complexity    Rehab Potential Good    PT Treatment/Interventions ADLs/Self Care Home Management;Cryotherapy;Iontophoresis 65m/ml Dexamethasone;Moist Heat;Gait training;Neuromuscular re-education;Balance training;Therapeutic exercise;Electrical Stimulation;Therapeutic activities;Functional mobility training;Stair training;Patient/family education;Manual techniques;Energy conservation;Taping;Vasopneumatic Device;Vestibular    PT Next Visit Plan Continue functional strength and balance exercises as tolerated and indicated.           Patient will benefit from skilled therapeutic intervention in order to improve the following deficits and impairments:  Abnormal gait,Difficulty walking,Increased muscle spasms,Decreased endurance,Decreased activity tolerance,Pain,Improper body mechanics,Impaired flexibility,Decreased balance,Decreased mobility,Decreased strength,Increased edema,Impaired sensation,Hypomobility  Visit  Diagnosis: Muscle weakness (generalized)  Unsteadiness on feet  Other abnormalities of gait and mobility  History of falling     Problem List Patient Active Problem List   Diagnosis Date Noted  . S/P amputation of foot, right ([place]   . Diabetic foot ulcer ([place] 09/15/2020  . Ankle pain   . Epidural abscess   . Abscess in epidural space of lumbar spine 09/12/2020  . Obstructive apnea   . Sleep disturbance 08/17/2012  . History of mammogram 07/29/2012  .  Chest pain 04/06/2012  . EDEMA 05/16/2008  . Morbid obesity ([place]) 04/13/2008  . CARPAL TUNNEL SYNDROME, BILATERAL 01/15/2008  . ALLERGIC RHINITIS, SEASONAL 01/06/2008  . ANXIETY DISORDER, GENERALIZED 05/01/2007  . PERIPHERAL NEUROPATHY 05/01/2007  . ENDOMETRIAL POLYP 05/01/2007  . HYPERPLASIA, ENDOMETRIAL NOS 05/01/2007  . Type 2 diabetes mellitus with hyperglycemia ([place]) 01/29/2007  . HYPERLIPIDEMIA 01/29/2007  . ANEMIA, OTHER, UNSPECIFIED 01/29/2007  . HYPERTENSION, BENIGN SYSTEMIC 01/29/2007    Scot Jun 04/26/2021, 1:45 PM  [place]. [place], [place], [address] Phone: [phone]   Fax:  [phone]  Name: Brittany Silva MRN: [id number] Date of Birth: 04/19/1956

## 2021-05-01 ENCOUNTER — Ambulatory Visit: Payer: Medicare Other | Admitting: Physical Therapy

## 2021-05-01 ENCOUNTER — Encounter: Payer: Self-pay | Admitting: Physical Therapy

## 2021-05-01 ENCOUNTER — Other Ambulatory Visit: Payer: Self-pay

## 2021-05-01 DIAGNOSIS — R2681 Unsteadiness on feet: Secondary | ICD-10-CM

## 2021-05-01 DIAGNOSIS — M6281 Muscle weakness (generalized): Secondary | ICD-10-CM

## 2021-05-01 DIAGNOSIS — R2689 Other abnormalities of gait and mobility: Secondary | ICD-10-CM

## 2021-05-01 NOTE — Therapy (Signed)
[place]. [place], [place], [address] Phone: [phone]   Fax:  [phone]  Physical Therapy Treatment  Patient Details  Name: Brittany Silva MRN: [id number] Date of Birth: 1956/01/25 Referring Provider (PT): Dawley, Theodoro Doing, DO neurology   Encounter Date: 05/01/2021   PT End of Session - 05/01/21 1505    Visit Number 19    Date for PT Re-Evaluation 04/27/21    Authorization Type medicare and UHC    PT Start Time 1430    PT Stop Time 1515    PT Time Calculation (min) 45 min    Activity Tolerance Patient tolerated treatment well    Behavior During Therapy [facility] for tasks assessed/performed           Past Medical History:  Diagnosis Date  . Ankle fracture    left  . Anxiety    chronic  . Cervical back pain with evidence of disc disease   . Diabetes mellitus   . Dyslipidemia   . Edema    lower extremity  . GERD (gastroesophageal reflux disease)   . Hyperlipidemia   . Hypertension   . Hypothyroid   . Obstructive apnea    Split study 05-22-12 - patient was too claustrophobic and did not persue CPAP.   Marland Kitchen Osteoarthritis   . PVC's (premature ventricular contractions)   . Retinopathy    diabetic, mild, nonproliferative bilateral  . TSH (thyroid-stimulating hormone deficiency)    evaluation off RX in 4/14  . Vitamin D deficiency     Past Surgical History:  Procedure Laterality Date  . ABDOMINAL HYSTERECTOMY    . AMPUTATION Left 09/21/2020   Procedure: AMPUTATION LEFT 4TH TOE;  Surgeon: Felipa Furnace, DPM;  Location: [place];  Service: Podiatry;  Laterality: Left;  . APPLICATION OF A-CELL OF EXTREMITY Left 09/21/2020   Procedure: APPLICATION OF A-CELL, left foot;  Surgeon: Felipa Furnace, DPM;  Location: [place];  Service: Podiatry;  Laterality: Left;  . CATARACT EXTRACTION Bilateral 2012  . EYE SURGERY Right 06/26/11   cataract removal Rt eye  . FOOT SURGERY     Ankle fracture  . index finger  surgery  2007   spider bite  . IR RADIOLOGIST EVAL & MGMT  09/14/2020  . ORIF ANKLE FRACTURE Left 0071,2197  . Right knee surgery  1966   torn cartilage  . TONSILLECTOMY  1962   adenoidectomy  . TUBAL LIGATION Bilateral 1986    There were no vitals filed for this visit.   Subjective Assessment - 05/01/21 1431    Subjective "Ok a little stiff"    Currently in Pain? Yes    Pain Score 3     Pain Location Back              [facility] PT Assessment - 05/01/21 0001      ROM / Strength   AROM / PROM / Strength Strength      Berg Balance Test   Sit to Stand Able to stand without using hands and stabilize independently    Standing Unsupported Able to stand safely 2 minutes    Sitting with Back Unsupported but Feet Supported on Floor or Stool Able to sit safely and securely 2 minutes    Stand to Sit Sits safely with minimal use of hands    Transfers Able to transfer safely, minor use of hands    Standing Unsupported with Eyes Closed Able to stand 10 seconds safely  Standing Unsupported with Feet Together Able to place feet together independently and stand 1 minute safely    From Standing, Reach Forward with Outstretched Arm Can reach forward >12 cm safely (5")    From Standing Position, Pick up Object from Central to pick up shoe safely and easily    From Standing Position, Turn to Look Behind Over each Shoulder Looks behind one side only/other side shows less weight shift    Turn 360 Degrees Able to turn 360 degrees safely in 4 seconds or less    Standing Unsupported, Alternately Place Feet on Step/Stool Able to stand independently and safely and complete 8 steps in 20 seconds    Standing Unsupported, One Foot in Front Needs help to step but can hold 15 seconds    Standing on One Leg Tries to lift leg/unable to hold 3 seconds but remains standing independently    Total Score 48                         [facility] Adult PT Treatment/Exercise - 05/01/21 0001      Lumbar  Exercises: Aerobic   UBE (Upper Arm Bike) L2 x 2 min each    Nustep L4 x6 min      Knee/Hip Exercises: Machines for Strengthening   Cybex Leg Press 40lb 2x12                    PT Short Term Goals - 03/20/21 1108      PT SHORT TERM GOAL #1   Title Independent with HEP    Time 2    Period Weeks    Status Achieved    Target Date 02/12/21             PT Long Term Goals - 05/01/21 1457      PT LONG TERM GOAL #4   Title BLE strength at least 4+/5    Status Partially Met   R hamstring 4/5 all other muscles 5/5                Plan - 05/01/21 1505    Clinical Impression Statement Pt has progressed overall, and reports improvement at home. She has difficulty with tandem and SLS activities of BURG balance test. Pt also demos some RLE HS weakness with MMT.Increase resistance tolerated on leg press.    Personal Factors and Comorbidities Comorbidity 3+;Past/Current Experience;Time since onset of injury/illness/exacerbation    Stability/Clinical Decision Making Evolving/Moderate complexity    Rehab Potential Good    PT Treatment/Interventions ADLs/Self Care Home Management;Cryotherapy;Iontophoresis 44m/ml Dexamethasone;Moist Heat;Gait training;Neuromuscular re-education;Balance training;Therapeutic exercise;Electrical Stimulation;Therapeutic activities;Functional mobility training;Stair training;Patient/family education;Manual techniques;Energy conservation;Taping;Vasopneumatic Device;Vestibular    PT Next Visit Plan Continue functional strength and balance exercises as tolerated and indicated.           Patient will benefit from skilled therapeutic intervention in order to improve the following deficits and impairments:  Abnormal gait,Difficulty walking,Increased muscle spasms,Decreased endurance,Decreased activity tolerance,Pain,Improper body mechanics,Impaired flexibility,Decreased balance,Decreased mobility,Decreased strength,Increased edema,Impaired  sensation,Hypomobility  Visit Diagnosis: Other abnormalities of gait and mobility  Unsteadiness on feet  Muscle weakness (generalized)     Problem List Patient Active Problem List   Diagnosis Date Noted  . S/P amputation of foot, right ([place]   . Diabetic foot ulcer ([place] 09/15/2020  . Ankle pain   . Epidural abscess   . Abscess in epidural space of lumbar spine 09/12/2020  . Obstructive apnea   . Sleep disturbance 08/17/2012  .  History of mammogram 07/29/2012  . Chest pain 04/06/2012  . EDEMA 05/16/2008  . Morbid obesity ([place]) 04/13/2008  . CARPAL TUNNEL SYNDROME, BILATERAL 01/15/2008  . ALLERGIC RHINITIS, SEASONAL 01/06/2008  . ANXIETY DISORDER, GENERALIZED 05/01/2007  . PERIPHERAL NEUROPATHY 05/01/2007  . ENDOMETRIAL POLYP 05/01/2007  . HYPERPLASIA, ENDOMETRIAL NOS 05/01/2007  . Type 2 diabetes mellitus with hyperglycemia ([place]) 01/29/2007  . HYPERLIPIDEMIA 01/29/2007  . ANEMIA, OTHER, UNSPECIFIED 01/29/2007  . HYPERTENSION, BENIGN SYSTEMIC 01/29/2007    Scot Jun 05/01/2021, 3:09 PM  [place]. [place], [place], [address] Phone: [phone]   Fax:  [phone]  Name: KAITY PITSTICK MRN: [id number] Date of Birth: 06/21/1956

## 2021-05-03 ENCOUNTER — Other Ambulatory Visit: Payer: Self-pay

## 2021-05-03 ENCOUNTER — Ambulatory Visit: Payer: Medicare Other | Attending: Neurological Surgery

## 2021-05-03 DIAGNOSIS — R2689 Other abnormalities of gait and mobility: Secondary | ICD-10-CM | POA: Insufficient documentation

## 2021-05-03 DIAGNOSIS — M25511 Pain in right shoulder: Secondary | ICD-10-CM | POA: Insufficient documentation

## 2021-05-03 DIAGNOSIS — M25632 Stiffness of left wrist, not elsewhere classified: Secondary | ICD-10-CM | POA: Diagnosis present

## 2021-05-03 DIAGNOSIS — R2681 Unsteadiness on feet: Secondary | ICD-10-CM

## 2021-05-03 DIAGNOSIS — M6281 Muscle weakness (generalized): Secondary | ICD-10-CM | POA: Insufficient documentation

## 2021-05-03 DIAGNOSIS — R209 Unspecified disturbances of skin sensation: Secondary | ICD-10-CM | POA: Diagnosis present

## 2021-05-03 DIAGNOSIS — M25631 Stiffness of right wrist, not elsewhere classified: Secondary | ICD-10-CM | POA: Diagnosis present

## 2021-05-03 DIAGNOSIS — Z9181 History of falling: Secondary | ICD-10-CM

## 2021-05-03 DIAGNOSIS — M62830 Muscle spasm of back: Secondary | ICD-10-CM | POA: Diagnosis present

## 2021-05-03 NOTE — Therapy (Signed)
[place]. [place], [place], [address] Phone: [phone]   Fax:  [phone]  Physical Therapy Treatment/ Re certification/Progress Note Progress Note Reporting Period 03/08/2021  to 05/03/2021  See note below for Objective Data and Assessment of Progress/Goals.       Patient Details  Name: Brittany Silva MRN: [id number] Date of Birth: [birth year] Referring Provider (PT): Dawley, Theodoro Doing, DO neurology   Encounter Date: 05/03/2021   PT End of Session - 05/03/21 1327    Visit Number 20    Date for PT Re-Evaluation 05/31/21    Authorization Type medicare and UHC    PT Start Time 1320    PT Stop Time 1400    PT Time Calculation (min) 40 min    Activity Tolerance Patient tolerated treatment well    Behavior During Therapy [facility] for tasks assessed/performed           Past Medical History:  Diagnosis Date  . Ankle fracture    left  . Anxiety    chronic  . Cervical back pain with evidence of disc disease   . Diabetes mellitus   . Dyslipidemia   . Edema    lower extremity  . GERD (gastroesophageal reflux disease)   . Hyperlipidemia   . Hypertension   . Hypothyroid   . Obstructive apnea    Split study 05-22-12 - patient was too claustrophobic and did not persue CPAP.   Marland Kitchen Osteoarthritis   . PVC's (premature ventricular contractions)   . Retinopathy    diabetic, mild, nonproliferative bilateral  . TSH (thyroid-stimulating hormone deficiency)    evaluation off RX in 4/14  . Vitamin D deficiency     Past Surgical History:  Procedure Laterality Date  . ABDOMINAL HYSTERECTOMY    . AMPUTATION Left 09/21/2020   Procedure: AMPUTATION LEFT 4TH TOE;  Surgeon: Felipa Furnace, DPM;  Location: [place];  Service: Podiatry;  Laterality: Left;  . APPLICATION OF A-CELL OF EXTREMITY Left 09/21/2020   Procedure: APPLICATION OF A-CELL, left foot;  Surgeon: Felipa Furnace, DPM;  Location: [place];  Service: Podiatry;   Laterality: Left;  . CATARACT EXTRACTION Bilateral 2012  . EYE SURGERY Right 06/26/11   cataract removal Rt eye  . FOOT SURGERY     Ankle fracture  . index finger surgery  2007   spider bite  . IR RADIOLOGIST EVAL & MGMT  09/14/2020  . ORIF ANKLE FRACTURE Left 3491,7915  . Right knee surgery  1966   torn cartilage  . TONSILLECTOMY  1962   adenoidectomy  . TUBAL LIGATION Bilateral 1986    There were no vitals filed for this visit.   Subjective Assessment - 05/03/21 1326    Subjective "no different than the usual". Shoulder bothering a little more today was doing alot of yardwork. Feels alot sturdier but right side arm and leg just still feel a little less strong    Pain Score 1    "normal"   Pain Location Back    Pain Orientation Lower    Pain Score 1    Pain Location Shoulder   and neck   Pain Orientation Right;Left              [facility] PT Assessment - 05/03/21 0001      AROM   Overall AROM Comments Grossly 5/5 LLE, RLE 4+/5 except for 4/5 R HS.      Berg Balance Test   Sit to Stand  Able to stand without using hands and stabilize independently    Standing Unsupported Able to stand safely 2 minutes    Sitting with Back Unsupported but Feet Supported on Floor or Stool Able to sit safely and securely 2 minutes    Stand to Sit Sits safely with minimal use of hands    Transfers Able to transfer safely, minor use of hands    Standing Unsupported with Eyes Closed Able to stand 10 seconds safely    Standing Unsupported with Feet Together Able to place feet together independently and stand 1 minute safely    From Standing, Reach Forward with Outstretched Arm Can reach confidently >25 cm (10")    From Standing Position, Pick up Object from Floor Able to pick up shoe safely and easily    From Standing Position, Turn to Look Behind Over each Shoulder Looks behind one side only/other side shows less weight shift   more rotaiton to the right with slight more sway but no LOB   Turn 360  Degrees Able to turn 360 degrees safely in 4 seconds or less    Standing Unsupported, Alternately Place Feet on Step/Stool Able to stand independently and safely and complete 8 steps in 20 seconds    Standing Unsupported, One Foot in Front Needs help to step but can hold 15 seconds    Standing on One Leg Tries to lift leg/unable to hold 3 seconds but remains standing independently    Total Score 49                         [facility] Adult PT Treatment/Exercise - 05/03/21 0001      Lumbar Exercises: Stretches   Other Lumbar Stretch Exercise B UT and LS stretches x 20" each      Lumbar Exercises: Aerobic   Nustep L3 x 6 legs only      Knee/Hip Exercises: Machines for Strengthening   Cybex Leg Press 40# x 15 BLE, 30#  x10 nly      Knee/Hip Exercises: Standing   Knee Flexion Both;Strengthening;10 reps    Knee Flexion Limitations 3#    Hip Flexion Both;Stengthening;2 sets;10 reps    Hip Flexion Limitations 3#                    PT Short Term Goals - 03/20/21 1108      PT SHORT TERM GOAL #1   Title Independent with HEP    Time 2    Period Weeks    Status Achieved    Target Date 02/12/21             PT Long Term Goals - 05/03/21 1329      PT LONG TERM GOAL #1   Title Pt will demo improved berg to at least 50/56 to demo improved balance and decreased fall risk    Baseline initial 30/56.  03/20/21: 45/56 initial LTG met. 05/03/21: 49/56    Status Partially Met   48/56     PT LONG TERM GOAL #2   Title Pt will complete 5TSTS in no greater than 12 seconds to demo improved functional strength and balance    Status Achieved      PT LONG TERM GOAL #3   Title Pt demo normalized gait pattern with minimal antalgic limp and increased symmetry of gait without LOB to facilitate safe negotiation of home an dcommunity environments.    Status Partially Met  PT LONG TERM GOAL #4   Title BLE strength at least 4+/5    Status Achieved      PT LONG TERM GOAL #5    Title Pt will report improved tolerance to prolonged standing for cooking with minimal low back and shoulder pain    Status Partially Met   "its better"  now can stand around 30 minutes before needing to sit, will lean on things as needed to help shift weight                Plan - 05/03/21 1354    Clinical Impression Statement Pt has progressed overall, and reports improvement at home. She has difficulty with narrow based stances such as tandem and SLS activities of  the berg. Overall strength with improvements but with some residual RLE HS weakness, R hip flexion shakiness compared to left side with MMT. She has made good progress and will benefit from a few more visits of physical therapy at decreased frequency to work further on strength, mobility and balance.    Personal Factors and Comorbidities Comorbidity 3+;Past/Current Experience;Time since onset of injury/illness/exacerbation    Stability/Clinical Decision Making Evolving/Moderate complexity    Rehab Potential Good    PT Frequency 1x / week    PT Duration 4 weeks    PT Treatment/Interventions ADLs/Self Care Home Management;Cryotherapy;Iontophoresis 33m/ml Dexamethasone;Moist Heat;Gait training;Neuromuscular re-education;Balance training;Therapeutic exercise;Electrical Stimulation;Therapeutic activities;Functional mobility training;Stair training;Patient/family education;Manual techniques;Energy conservation;Taping;Vasopneumatic Device;Vestibular    PT Next Visit Plan Continue functional strength and balance exercises as tolerated and indicated.    Consulted and Agree with Plan of Care Patient           Patient will benefit from skilled therapeutic intervention in order to improve the following deficits and impairments:  Abnormal gait,Difficulty walking,Increased muscle spasms,Decreased endurance,Decreased activity tolerance,Pain,Improper body mechanics,Impaired flexibility,Decreased balance,Decreased mobility,Decreased  strength,Increased edema,Impaired sensation,Hypomobility  Visit Diagnosis: Other abnormalities of gait and mobility  Unsteadiness on feet  Muscle weakness (generalized)  History of falling  Muscle spasm of back  Unspecified disturbances of skin sensation  Right shoulder pain, unspecified chronicity  Stiffness of left wrist, not elsewhere classified  Stiffness of right wrist, not elsewhere classified     Problem List Patient Active Problem List   Diagnosis Date Noted  . S/P amputation of foot, right ([facility]   . Diabetic foot ulcer ([facility] 09/15/2020  . Ankle pain   . Epidural abscess   . Abscess in epidural space of lumbar spine 09/12/2020  . Obstructive apnea   . Sleep disturbance 08/17/2012  . History of mammogram 07/29/2012  . Chest pain 04/06/2012  . EDEMA 05/16/2008  . Morbid obesity ([place] 04/13/2008  . CARPAL TUNNEL SYNDROME, BILATERAL 01/15/2008  . ALLERGIC RHINITIS, SEASONAL 01/06/2008  . ANXIETY DISORDER, GENERALIZED 05/01/2007  . PERIPHERAL NEUROPATHY 05/01/2007  . ENDOMETRIAL POLYP 05/01/2007  . HYPERPLASIA, ENDOMETRIAL NOS 05/01/2007  . Type 2 diabetes mellitus with hyperglycemia ([place] 01/29/2007  . HYPERLIPIDEMIA 01/29/2007  . ANEMIA, OTHER, UNSPECIFIED 01/29/2007  . HYPERTENSION, BENIGN SYSTEMIC 01/29/2007    MHall Busing PT, DPT 05/03/2021, 3:13 PM  [place] [address]Phone: [phone]  Fax:  [phone] Name: TCHLOE FLISMRN: [id number] of Birth: [birth year]

## 2021-05-08 ENCOUNTER — Ambulatory Visit: Payer: Medicare Other

## 2021-05-10 ENCOUNTER — Ambulatory Visit: Payer: Medicare Other

## 2021-05-16 ENCOUNTER — Ambulatory Visit: Payer: Medicare Other

## 2021-05-23 ENCOUNTER — Other Ambulatory Visit: Payer: Self-pay

## 2021-05-23 ENCOUNTER — Ambulatory Visit: Payer: Medicare Other

## 2021-05-23 ENCOUNTER — Ambulatory Visit (INDEPENDENT_AMBULATORY_CARE_PROVIDER_SITE_OTHER): Payer: Medicare Other | Admitting: Podiatry

## 2021-05-23 ENCOUNTER — Encounter: Payer: Self-pay | Admitting: Podiatry

## 2021-05-23 DIAGNOSIS — T148XXA Other injury of unspecified body region, initial encounter: Secondary | ICD-10-CM | POA: Diagnosis not present

## 2021-05-23 MED ORDER — DOXYCYCLINE HYCLATE 100 MG PO TABS: 100.0000 mg | ORAL_TABLET | Freq: Two times a day (BID) | ORAL | 0 refills | Status: AC

## 2021-05-29 ENCOUNTER — Encounter: Payer: Self-pay | Admitting: Podiatry

## 2021-05-29 ENCOUNTER — Ambulatory Visit: Payer: Medicare Other

## 2021-05-29 NOTE — Progress Notes (Signed)
Subjective:  Patient ID: Brittany Silva, female    DOB: 13-Jul-1956,  MRN: [id number]  Chief Complaint  Patient presents with   Diabetes    Right foot 5th toe has a blister     65 y.o. female presents with the above complaint.  Patient presents with right fifth digit blister after she stubbed it.  She states is somewhat painful.  She wanted to get it evaluated given that she has diabetes with last A1c of 8.6.  She states that it hurts to walk on it.  She has not tried in the office for it.  She denies any other acute complaints she has not place her self surgical shoe.   Review of Systems: Negative except as noted in the HPI. Denies N/V/F/Ch.  Past Medical History:  Diagnosis Date   Ankle fracture    left   Anxiety    chronic   Cervical back pain with evidence of disc disease    Diabetes mellitus    Dyslipidemia    Edema    lower extremity   GERD (gastroesophageal reflux disease)    Hyperlipidemia    Hypertension    Hypothyroid    Obstructive apnea    Split study 05-22-12 - patient was too claustrophobic and did not persue CPAP.    Osteoarthritis    PVC's (premature ventricular contractions)    Retinopathy    diabetic, mild, nonproliferative bilateral   TSH (thyroid-stimulating hormone deficiency)    evaluation off RX in 4/14   Vitamin D deficiency     Current Outpatient Medications:    doxycycline (VIBRA-TABS) 100 MG tablet, Take 1 tablet (100 mg total) by mouth 2 (two) times daily for 14 days., Disp: 28 tablet, Rfl: 0   acetaminophen (TYLENOL) 500 MG tablet, Take 500-2,000 mg by mouth as needed for moderate pain. , Disp: , Rfl:    amLODipine (NORVASC) 5 MG tablet, Take 1 tablet (5 mg total) by mouth daily., Disp: 90 tablet, Rfl: 3   atorvastatin (LIPITOR) 40 MG tablet, Take 40 mg by mouth daily., Disp: , Rfl:    B-D ULTRAFINE III SHORT PEN 31G X 8 MM MISC, Inject into the skin as directed., Disp: , Rfl:    BD INSULIN SYRINGE U/F 31G X 5/16" 1 ML MISC, See admin  instructions. with insulin, Disp: , Rfl:    Continuous Blood Gluc Sensor (FREESTYLE LIBRE 2 SENSOR) MISC, See admin instructions., Disp: , Rfl:    cyclobenzaprine (FLEXERIL) 5 MG tablet, Take 5 mg by mouth 3 (three) times daily as needed., Disp: , Rfl:    DULoxetine (CYMBALTA) 30 MG capsule, Take 90 mg by mouth daily., Disp: , Rfl:    furosemide (LASIX) 20 MG tablet, Take 40 mg by mouth daily. , Disp: , Rfl:    gabapentin (NEURONTIN) 100 MG capsule, Take 200 mg by mouth 3 (three) times daily., Disp: , Rfl:    glucose blood (ONETOUCH VERIO) test strip, TEST BLOOD SUGAR THREE TIMES DAILY, Disp: , Rfl:    HUMALOG KWIKPEN 100 UNIT/ML KwikPen, Inject 15-25 Units into the skin in the morning, at noon, and at bedtime., Disp: , Rfl:    LEVEMIR 100 UNIT/ML injection, Inject 60 Units into the skin in the morning and at bedtime., Disp: , Rfl:    losartan (COZAAR) 100 MG tablet, , Disp: , Rfl:    meloxicam (MOBIC) 15 MG tablet, Take 15 mg by mouth daily., Disp: , Rfl:    NOVOFINE PEN NEEDLE 32G X 6 MM MISC,  Inject into the skin daily., Disp: , Rfl:    ondansetron (ZOFRAN ODT) 4 MG disintegrating tablet, Take 1 tablet (4 mg total) by mouth every 8 (eight) hours as needed for nausea or vomiting., Disp: 15 tablet, Rfl: 0   potassium chloride (KLOR-CON) 8 MEQ tablet, Take 8 mEq by mouth 2 (two) times daily as needed (low potassium). , Disp: , Rfl:    SYNTHROID 75 MCG tablet, Take 75 mcg by mouth every morning., Disp: , Rfl:    traMADol (ULTRAM) 50 MG tablet, Take 1 tablet (50 mg total) by mouth every 8 (eight) hours., Disp: 15 tablet, Rfl: 0  Social History   Tobacco Use  Smoking Status Never  Smokeless Tobacco Never    Allergies  Allergen Reactions   Hydrochlorothiazide     rash   Lisinopril     Other reaction(s): Cough   Other Other (See Comments)   Adhesive [Tape]    Codeine Phosphate     REACTION: throat swelling   Darvon Other (See Comments)    Jittering, skin problems   Darvon [Propoxyphene  Hcl]     Hyperactivity; "makes me crazy"   Hydrocodone-Acetaminophen Itching    *Has tolerated hydromorphone as an adult* Per pt, when she was a child had throat itching, difficulty breathing and feel strange and also vomit    Metformin Hcl Other (See Comments)   Polymyxin B    Propoxyphene Hcl     REACTION: Makes her crazy   Latex Rash   Neosporin [Neomycin-Bacitracin Zn-Polymyx] Rash   Sulfamethoxazole Itching and Rash   Objective:  There were no vitals filed for this visit. There is no height or weight on file to calculate BMI. Constitutional Well developed. Well nourished.  Vascular Dorsalis pedis pulses palpable bilaterally. Posterior tibial pulses palpable bilaterally. Capillary refill normal to all digits.  No cyanosis or clubbing noted. Pedal hair growth normal.  Neurologic Normal speech. Oriented to person, place, and time. Epicritic sensation to light touch grossly present bilaterally.  Dermatologic Nails well groomed and normal in appearance. No open wounds. No skin lesions.  Orthopedic: Pain on palpation to the right fifth digit.  Mild ecchymosis noted.  Mild blister noted.  No underlying ulceration noted.  No malodor present.  No exposure of bone.  Mild erythema noted   Radiographs: None Assessment:   1. Contusion of soft tissue    Plan:  Patient was evaluated and treated and all questions answered.  Right fifth digit soft tissue contusion -I explained to the patient the etiology of contusion various treatment options were discussed.  Given that patient is a diabetic with A1c that is uncontrolled he will benefit from a course of doxycycline to help clear up any infection.  She does have mild erythema.  At this time I am not concerned for any acute diabetic ulcers or any worsening of infection.  However she will benefit from surgical shoe.  Surgical shoe was dispensed to offload the area.  If there is no improvement we will discuss other options.  She states  understanding.  No follow-ups on file.

## 2021-06-05 DIAGNOSIS — E1065 Type 1 diabetes mellitus with hyperglycemia: Secondary | ICD-10-CM | POA: Diagnosis not present

## 2021-06-13 ENCOUNTER — Other Ambulatory Visit: Payer: Self-pay

## 2021-06-13 ENCOUNTER — Ambulatory Visit (INDEPENDENT_AMBULATORY_CARE_PROVIDER_SITE_OTHER): Payer: HMO | Admitting: Podiatry

## 2021-06-13 DIAGNOSIS — L97529 Non-pressure chronic ulcer of other part of left foot with unspecified severity: Secondary | ICD-10-CM | POA: Diagnosis not present

## 2021-06-13 DIAGNOSIS — T148XXA Other injury of unspecified body region, initial encounter: Secondary | ICD-10-CM

## 2021-06-13 DIAGNOSIS — E11621 Type 2 diabetes mellitus with foot ulcer: Secondary | ICD-10-CM

## 2021-06-19 ENCOUNTER — Encounter: Payer: Self-pay | Admitting: Podiatry

## 2021-06-19 NOTE — Progress Notes (Signed)
Subjective:  Patient ID: Brittany Silva, female    DOB: Nov 21, 1956,  MRN: [id number]  Chief Complaint  Patient presents with   Toe Pain    Right foot 5th toe     65 y.o. female presents with the above complaint.  Patient presents with right fifth digit blister.  Patient states that she is doing a lot better.  No longer any pain.  She is has regular shoes that she is been using and seems to be working well.  She denies any other acute complaints   Review of Systems: Negative except as noted in the HPI. Denies N/V/F/Ch.  Past Medical History:  Diagnosis Date   Ankle fracture    left   Anxiety    chronic   Cervical back pain with evidence of disc disease    Diabetes mellitus    Dyslipidemia    Edema    lower extremity   GERD (gastroesophageal reflux disease)    Hyperlipidemia    Hypertension    Hypothyroid    Obstructive apnea    Split study 05-22-12 - patient was too claustrophobic and did not persue CPAP.    Osteoarthritis    PVC's (premature ventricular contractions)    Retinopathy    diabetic, mild, nonproliferative bilateral   TSH (thyroid-stimulating hormone deficiency)    evaluation off RX in 4/14   Vitamin D deficiency     Current Outpatient Medications:    acetaminophen (TYLENOL) 500 MG tablet, Take 500-2,000 mg by mouth as needed for moderate pain. , Disp: , Rfl:    amLODipine (NORVASC) 5 MG tablet, Take 1 tablet (5 mg total) by mouth daily., Disp: 90 tablet, Rfl: 3   atorvastatin (LIPITOR) 40 MG tablet, Take 40 mg by mouth daily., Disp: , Rfl:    B-D ULTRAFINE III SHORT PEN 31G X 8 MM MISC, Inject into the skin as directed., Disp: , Rfl:    BD INSULIN SYRINGE U/F 31G X 5/16" 1 ML MISC, See admin instructions. with insulin, Disp: , Rfl:    Continuous Blood Gluc Sensor (FREESTYLE LIBRE 2 SENSOR) MISC, See admin instructions., Disp: , Rfl:    cyclobenzaprine (FLEXERIL) 5 MG tablet, Take 5 mg by mouth 3 (three) times daily as needed., Disp: , Rfl:    DULoxetine  (CYMBALTA) 30 MG capsule, Take 90 mg by mouth daily., Disp: , Rfl:    furosemide (LASIX) 20 MG tablet, Take 40 mg by mouth daily. , Disp: , Rfl:    gabapentin (NEURONTIN) 100 MG capsule, Take 200 mg by mouth 3 (three) times daily., Disp: , Rfl:    glucose blood (ONETOUCH VERIO) test strip, TEST BLOOD SUGAR THREE TIMES DAILY, Disp: , Rfl:    HUMALOG KWIKPEN 100 UNIT/ML KwikPen, Inject 15-25 Units into the skin in the morning, at noon, and at bedtime., Disp: , Rfl:    LEVEMIR 100 UNIT/ML injection, Inject 60 Units into the skin in the morning and at bedtime., Disp: , Rfl:    losartan (COZAAR) 100 MG tablet, , Disp: , Rfl:    meloxicam (MOBIC) 15 MG tablet, Take 15 mg by mouth daily., Disp: , Rfl:    NOVOFINE PEN NEEDLE 32G X 6 MM MISC, Inject into the skin daily., Disp: , Rfl:    ondansetron (ZOFRAN ODT) 4 MG disintegrating tablet, Take 1 tablet (4 mg total) by mouth every 8 (eight) hours as needed for nausea or vomiting., Disp: 15 tablet, Rfl: 0   potassium chloride (KLOR-CON) 8 MEQ tablet, Take 8 mEq  by mouth 2 (two) times daily as needed (low potassium). , Disp: , Rfl:    SYNTHROID 75 MCG tablet, Take 75 mcg by mouth every morning., Disp: , Rfl:    traMADol (ULTRAM) 50 MG tablet, Take 1 tablet (50 mg total) by mouth every 8 (eight) hours., Disp: 15 tablet, Rfl: 0  Social History   Tobacco Use  Smoking Status Never  Smokeless Tobacco Never    Allergies  Allergen Reactions   Hydrochlorothiazide     rash   Lisinopril     Other reaction(s): Cough   Other Other (See Comments)   Adhesive [Tape]    Codeine Phosphate     REACTION: throat swelling   Darvon Other (See Comments)    Jittering, skin problems   Darvon [Propoxyphene Hcl]     Hyperactivity; "makes me crazy"   Hydrocodone-Acetaminophen Itching    *Has tolerated hydromorphone as an adult* Per pt, when she was a child had throat itching, difficulty breathing and feel strange and also vomit    Metformin Hcl Other (See Comments)    Polymyxin B    Propoxyphene Hcl     REACTION: Makes her crazy   Latex Rash   Neosporin [Neomycin-Bacitracin Zn-Polymyx] Rash   Sulfamethoxazole Itching and Rash   Objective:  There were no vitals filed for this visit. There is no height or weight on file to calculate BMI. Constitutional Well developed. Well nourished.  Vascular Dorsalis pedis pulses palpable bilaterally. Posterior tibial pulses palpable bilaterally. Capillary refill normal to all digits.  No cyanosis or clubbing noted. Pedal hair growth normal.  Neurologic Normal speech. Oriented to person, place, and time. Epicritic sensation to light touch grossly present bilaterally.  Dermatologic Nails well groomed and normal in appearance. No open wounds. No skin lesions.  Orthopedic: No further pain on palpation to the right fifth digit.  Now ecchymosis noted.  Now blister noted.  No underlying ulceration noted.  No malodor present.  No exposure of bone.  Mild erythema noted   Radiographs: None Assessment:   1. Contusion of soft tissue   2. Diabetic ulcer of toe of left foot associated with type 2 diabetes mellitus, unspecified ulcer stage ([place])     Plan:  Patient was evaluated and treated and all questions answered.  Right fifth digit soft tissue contusion -Clinically resolved with the surgical shoe.  At this time patient can discontinue the shoe and return to regular sneakers if any foot and ankle issues arises have discussed to come back and see me.  Also discussed with her shoe gear modification.  She states understanding and will do so immediately.  No follow-ups on file.

## 2021-07-23 DIAGNOSIS — Z803 Family history of malignant neoplasm of breast: Secondary | ICD-10-CM | POA: Diagnosis not present

## 2021-07-23 DIAGNOSIS — Z1231 Encounter for screening mammogram for malignant neoplasm of breast: Secondary | ICD-10-CM | POA: Diagnosis not present

## 2021-07-31 DIAGNOSIS — H43813 Vitreous degeneration, bilateral: Secondary | ICD-10-CM | POA: Diagnosis not present

## 2021-07-31 DIAGNOSIS — H35033 Hypertensive retinopathy, bilateral: Secondary | ICD-10-CM | POA: Diagnosis not present

## 2021-07-31 DIAGNOSIS — E113413 Type 2 diabetes mellitus with severe nonproliferative diabetic retinopathy with macular edema, bilateral: Secondary | ICD-10-CM | POA: Diagnosis not present

## 2021-08-21 ENCOUNTER — Other Ambulatory Visit: Payer: Self-pay

## 2021-08-21 ENCOUNTER — Encounter: Payer: Self-pay | Admitting: Infectious Disease

## 2021-08-21 ENCOUNTER — Ambulatory Visit: Payer: HMO | Admitting: Infectious Disease

## 2021-08-21 VITALS — BP 147/91 | HR 92 | Temp 99.0°F | Wt 281.0 lb

## 2021-08-21 DIAGNOSIS — G061 Intraspinal abscess and granuloma: Secondary | ICD-10-CM

## 2021-08-21 DIAGNOSIS — Z89431 Acquired absence of right foot: Secondary | ICD-10-CM | POA: Diagnosis not present

## 2021-08-21 DIAGNOSIS — E1165 Type 2 diabetes mellitus with hyperglycemia: Secondary | ICD-10-CM | POA: Diagnosis not present

## 2021-08-21 DIAGNOSIS — G062 Extradural and subdural abscess, unspecified: Secondary | ICD-10-CM

## 2021-08-21 DIAGNOSIS — M47817 Spondylosis without myelopathy or radiculopathy, lumbosacral region: Secondary | ICD-10-CM | POA: Diagnosis not present

## 2021-08-21 HISTORY — DX: Spondylosis without myelopathy or radiculopathy, lumbosacral region: M47.817

## 2021-08-21 NOTE — Progress Notes (Signed)
Subjective:  Chief complaint: Worsening lower back pain  Patient ID: Brittany Silva, female    DOB: 03/03/1956, 65 y.o.   MRN: [id number]   HPI   65 y.o. female with  L4-5 facet joint and epidural abscess.  Status post IR guided aspiration with Strep mitis/oralis S to PCN and now on high dose penicillin. 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 Posey Pronto.  She completed IV penicillin and then was switched to amoxicillin  Her amputation site the foot has healed up well.   She completed her antibiotics and has been off of them since then.  Pain had not worsened initally off antibiotics.    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    Her inflammatory markers remained normal off antibiotics when I saw her in March.  She has stayed off antibiotics since then now 6 months later.  She is having more pain now she first noticed more pain when she was doing physical therapy back in March but it is now been worsening in the last month or 2 she is also noticed worsened constipation that seems to accompany her back pain.  Has some spasming at times it occurs as well.  Relieved by rest and certain positions.  It does not hurt all of the time she does not have fevers chills nausea or other systemic systemic symptoms of infection.  Her postoperative site in her foot seems to be doing well and she is followed by podiatry.     Past Medical History:  Diagnosis Date   Ankle fracture    left   Anxiety    chronic   Cervical back pain with evidence of disc disease    Diabetes mellitus    Dyslipidemia    Edema    lower extremity   GERD (gastroesophageal reflux disease)    Hyperlipidemia    Hypertension    Hypothyroid    Obstructive apnea    Split study 05-22-12 - patient was too claustrophobic and did not persue CPAP.    Osteoarthritis    PVC's (premature ventricular contractions)     Retinopathy    diabetic, mild, nonproliferative bilateral   TSH (thyroid-stimulating hormone deficiency)    evaluation off RX in 4/14   Vitamin D deficiency     Past Surgical History:  Procedure Laterality Date   ABDOMINAL HYSTERECTOMY     AMPUTATION Left 09/21/2020   Procedure: AMPUTATION LEFT 4TH TOE;  Surgeon: Felipa Furnace, DPM;  Location: [facility];  Service: Podiatry;  Laterality: Left;   APPLICATION OF A-CELL OF EXTREMITY Left 09/21/2020   Procedure: APPLICATION OF A-CELL, left foot;  Surgeon: Felipa Furnace, DPM;  Location: [facility];  Service: Podiatry;  Laterality: Left;   CATARACT EXTRACTION Bilateral 2012   EYE SURGERY Right 06/26/11   cataract removal Rt eye   FOOT SURGERY     Ankle fracture   index finger surgery  2007   spider bite   IR RADIOLOGIST EVAL & MGMT  09/14/2020   ORIF ANKLE FRACTURE Left 2006,2008   Right knee surgery  1966   torn cartilage   TONSILLECTOMY  1962   adenoidectomy   TUBAL LIGATION Bilateral 1986    Family History  Problem Relation Age of Onset   Coronary artery disease Father        MI at age 59   Hypertension Mother  Thyroid disease Mother    Ulcers Sister    Allergies Sister    Psoriasis Sister    Diabetes Maternal Grandfather    Heart attack Paternal Grandfather    Colon cancer Neg Hx       Social History   Socioeconomic History   Marital status: Married    Spouse name: Not on file   Number of children: 2   Years of education: 12   Highest education level: Not on file  Occupational History    Employer: UNEMPLOYED    Comment: [employer]  Tobacco Use   Smoking status: Never   Smokeless tobacco: Never  Vaping Use   Vaping Use: Never used  Substance and Sexual Activity   Alcohol use: No   Drug use: No   Sexual activity: Not on file  Other Topics Concern   Not on file  Social History Narrative   Not on file   Social Determinants of Health   Financial Resource Strain: Not on file  Food Insecurity: Not on file   Transportation Needs: Not on file  Physical Activity: Not on file  Stress: Not on file  Social Connections: Not on file    Allergies  Allergen Reactions   Hydrochlorothiazide     rash   Lisinopril     Other reaction(s): Cough   Other Other (See Comments)   Adhesive [Tape]    Codeine Phosphate     REACTION: throat swelling   Darvon Other (See Comments)    Jittering, skin problems   Darvon [Propoxyphene Hcl]     Hyperactivity; "makes me crazy"   Hydrocodone-Acetaminophen Itching    *Has tolerated hydromorphone as an adult* Per pt, when she was a child had throat itching, difficulty breathing and feel strange and also vomit    Metformin Hcl Other (See Comments)   Polymyxin B    Propoxyphene Hcl     REACTION: Makes her crazy   Latex Rash   Neosporin [Neomycin-Bacitracin Zn-Polymyx] Rash   Sulfamethoxazole Itching and Rash     Current Outpatient Medications:    acetaminophen (TYLENOL) 500 MG tablet, Take 500-2,000 mg by mouth as needed for moderate pain. , Disp: , Rfl:    amLODipine (NORVASC) 5 MG tablet, Take 1 tablet (5 mg total) by mouth daily., Disp: 90 tablet, Rfl: 3   atorvastatin (LIPITOR) 40 MG tablet, Take 40 mg by mouth daily., Disp: , Rfl:    B-D ULTRAFINE III SHORT PEN 31G X 8 MM MISC, Inject into the skin as directed., Disp: , Rfl:    BD INSULIN SYRINGE U/F 31G X 5/16" 1 ML MISC, See admin instructions. with insulin, Disp: , Rfl:    Continuous Blood Gluc Sensor (FREESTYLE LIBRE 2 SENSOR) MISC, See admin instructions., Disp: , Rfl:    cyclobenzaprine (FLEXERIL) 5 MG tablet, Take 5 mg by mouth 3 (three) times daily as needed., Disp: , Rfl:    DULoxetine (CYMBALTA) 30 MG capsule, Take 90 mg by mouth daily., Disp: , Rfl:    furosemide (LASIX) 20 MG tablet, Take 40 mg by mouth daily. , Disp: , Rfl:    gabapentin (NEURONTIN) 100 MG capsule, Take 200 mg by mouth 3 (three) times daily., Disp: , Rfl:    glucose blood (ONETOUCH VERIO) test strip, TEST BLOOD SUGAR THREE  TIMES DAILY, Disp: , Rfl:    HUMALOG KWIKPEN 100 UNIT/ML KwikPen, Inject 15-25 Units into the skin in the morning, at noon, and at bedtime., Disp: , Rfl:    LEVEMIR 100  UNIT/ML injection, Inject 60 Units into the skin in the morning and at bedtime., Disp: , Rfl:    losartan (COZAAR) 100 MG tablet, , Disp: , Rfl:    meloxicam (MOBIC) 15 MG tablet, Take 15 mg by mouth daily., Disp: , Rfl:    NOVOFINE PEN NEEDLE 32G X 6 MM MISC, Inject into the skin daily., Disp: , Rfl:    ondansetron (ZOFRAN ODT) 4 MG disintegrating tablet, Take 1 tablet (4 mg total) by mouth every 8 (eight) hours as needed for nausea or vomiting., Disp: 15 tablet, Rfl: 0   potassium chloride (KLOR-CON) 8 MEQ tablet, Take 8 mEq by mouth 2 (two) times daily as needed (low potassium). , Disp: , Rfl:    SYNTHROID 75 MCG tablet, Take 75 mcg by mouth every morning., Disp: , Rfl:    traMADol (ULTRAM) 50 MG tablet, Take 1 tablet (50 mg total) by mouth every 8 (eight) hours., Disp: 15 tablet, Rfl: 0    Review of Systems  Constitutional:  Negative for activity change, appetite change, chills, diaphoresis, fatigue, fever and unexpected weight change.  HENT:  Negative for congestion, rhinorrhea, sinus pressure, sneezing, sore throat and trouble swallowing.   Eyes:  Negative for photophobia and visual disturbance.  Respiratory:  Negative for cough, chest tightness, shortness of breath, wheezing and stridor.   Cardiovascular:  Negative for chest pain, palpitations and leg swelling.  Gastrointestinal:  Negative for abdominal distention, abdominal pain, anal bleeding, blood in stool, constipation, diarrhea, nausea and vomiting.  Genitourinary:  Negative for difficulty urinating, dysuria, flank pain and hematuria.  Musculoskeletal:  Positive for back pain. Negative for arthralgias, gait problem, joint swelling and myalgias.  Skin:  Negative for color change, pallor, rash and wound.  Neurological:  Negative for dizziness, tremors, weakness and  light-headedness.  Hematological:  Negative for adenopathy. Does not bruise/bleed easily.  Psychiatric/Behavioral:  Negative for agitation, behavioral problems, confusion, decreased concentration, dysphoric mood and sleep disturbance.       Objective:   Physical Exam Constitutional:      General: She is not in acute distress.    Appearance: Normal appearance. She is well-developed. She is obese. She is not ill-appearing or diaphoretic.  HENT:     Head: Normocephalic and atraumatic.     Right Ear: Hearing and external ear normal.     Left Ear: Hearing and external ear normal.     Nose: No nasal deformity or rhinorrhea.  Eyes:     General: No scleral icterus.    Conjunctiva/sclera: Conjunctivae normal.     Right eye: Right conjunctiva is not injected.     Left eye: Left conjunctiva is not injected.     Pupils: Pupils are equal, round, and reactive to light.  Neck:     Vascular: No JVD.  Cardiovascular:     Rate and Rhythm: Normal rate and regular rhythm.     Heart sounds: Normal heart sounds, S1 normal and S2 normal.  Pulmonary:     Effort: No respiratory distress.     Breath sounds: No wheezing.  Abdominal:     General: There is no distension.     Palpations: Abdomen is soft. There is no mass.     Tenderness: There is no abdominal tenderness.  Musculoskeletal:        General: Normal range of motion.     Right shoulder: Normal.     Left shoulder: Normal.     Cervical back: Normal range of motion and neck supple.  Right hip: Normal.     Left hip: Normal.     Right knee: Normal.     Left knee: Normal.  Lymphadenopathy:     Head:     Right side of head: No submandibular, preauricular or posterior auricular adenopathy.     Left side of head: No submandibular, preauricular or posterior auricular adenopathy.     Cervical: No cervical adenopathy.     Right cervical: No superficial or deep cervical adenopathy.    Left cervical: No superficial or deep cervical adenopathy.   Skin:    General: Skin is warm and dry.     Coloration: Skin is not pale.     Findings: No abrasion, bruising, ecchymosis, erythema, lesion or rash.     Nails: There is no clubbing.  Neurological:     General: No focal deficit present.     Mental Status: She is alert and oriented to person, place, and time.     Sensory: No sensory deficit.     Coordination: Coordination normal.     Gait: Gait normal.  Psychiatric:        Attention and Perception: She is attentive.        Mood and Affect: Mood normal.        Speech: Speech normal.        Behavior: Behavior normal. Behavior is cooperative.        Thought Content: Thought content normal.        Judgment: Judgment normal.    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  Her worsening back pain is disconcerting.  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 Dr Serita Grit note from 06/13/2021 reviewed  DM with neuropathy: good shoes, prevention critical

## 2021-08-22 LAB — CBC WITH DIFFERENTIAL/PLATELET
Absolute Monocytes: 462 cells/uL (ref 200–950)
Basophils Absolute: 27 cells/uL (ref 0–200)
Basophils Relative: 0.4 %
Eosinophils Absolute: 394 cells/uL (ref 15–500)
Eosinophils Relative: 5.8 %
HCT: 40.7 % (ref 35.0–45.0)
Hemoglobin: 13.7 g/dL (ref 11.7–15.5)
Lymphs Abs: 986 cells/uL (ref 850–3900)
MCH: 30.5 pg (ref 27.0–33.0)
MCHC: 33.7 g/dL (ref 32.0–36.0)
MCV: 90.6 fL (ref 80.0–100.0)
MPV: 9.7 fL (ref 7.5–12.5)
Monocytes Relative: 6.8 %
Neutro Abs: 4930 cells/uL (ref 1500–7800)
Neutrophils Relative %: 72.5 %
Platelets: 221 10*3/uL (ref 140–400)
RBC: 4.49 10*6/uL (ref 3.80–5.10)
RDW: 12.6 % (ref 11.0–15.0)
Total Lymphocyte: 14.5 %
WBC: 6.8 10*3/uL (ref 3.8–10.8)

## 2021-08-22 LAB — BASIC METABOLIC PANEL WITH GFR
BUN: 20 mg/dL (ref 7–25)
CO2: 28 mmol/L (ref 20–32)
Calcium: 9.1 mg/dL (ref 8.6–10.4)
Chloride: 103 mmol/L (ref 98–110)
Creat: 0.88 mg/dL (ref 0.50–1.05)
Glucose, Bld: 215 mg/dL — ABNORMAL HIGH (ref 65–99)
Potassium: 4.2 mmol/L (ref 3.5–5.3)
Sodium: 140 mmol/L (ref 135–146)
eGFR: 73 mL/min/{1.73_m2} (ref >=60)

## 2021-08-22 LAB — SEDIMENTATION RATE: Sed Rate: 6 mm/h (ref 0–30)

## 2021-08-22 LAB — C-REACTIVE PROTEIN: CRP: 3 mg/L (ref <8.0)

## 2021-09-17 DIAGNOSIS — F3341 Major depressive disorder, recurrent, in partial remission: Secondary | ICD-10-CM | POA: Diagnosis not present

## 2021-09-17 DIAGNOSIS — E1169 Type 2 diabetes mellitus with other specified complication: Secondary | ICD-10-CM | POA: Diagnosis not present

## 2021-09-17 DIAGNOSIS — E039 Hypothyroidism, unspecified: Secondary | ICD-10-CM | POA: Diagnosis not present

## 2021-09-17 DIAGNOSIS — I10 Essential (primary) hypertension: Secondary | ICD-10-CM | POA: Diagnosis not present

## 2021-09-17 DIAGNOSIS — Z23 Encounter for immunization: Secondary | ICD-10-CM | POA: Diagnosis not present

## 2021-09-17 DIAGNOSIS — E785 Hyperlipidemia, unspecified: Secondary | ICD-10-CM | POA: Diagnosis not present

## 2021-09-17 DIAGNOSIS — G629 Polyneuropathy, unspecified: Secondary | ICD-10-CM | POA: Diagnosis not present

## 2021-09-17 DIAGNOSIS — Z794 Long term (current) use of insulin: Secondary | ICD-10-CM | POA: Diagnosis not present

## 2021-09-17 DIAGNOSIS — S98132A Complete traumatic amputation of one left lesser toe, initial encounter: Secondary | ICD-10-CM | POA: Diagnosis not present

## 2021-09-17 DIAGNOSIS — Z0000 Encounter for general adult medical examination without abnormal findings: Secondary | ICD-10-CM | POA: Diagnosis not present

## 2021-09-17 DIAGNOSIS — Z1389 Encounter for screening for other disorder: Secondary | ICD-10-CM | POA: Diagnosis not present

## 2021-09-25 DIAGNOSIS — Z780 Asymptomatic menopausal state: Secondary | ICD-10-CM | POA: Diagnosis not present

## 2021-10-18 ENCOUNTER — Ambulatory Visit: Payer: HMO | Admitting: Infectious Disease

## 2021-11-12 DIAGNOSIS — E1165 Type 2 diabetes mellitus with hyperglycemia: Secondary | ICD-10-CM | POA: Diagnosis not present

## 2021-11-12 DIAGNOSIS — E039 Hypothyroidism, unspecified: Secondary | ICD-10-CM | POA: Diagnosis not present

## 2021-11-12 DIAGNOSIS — I10 Essential (primary) hypertension: Secondary | ICD-10-CM | POA: Diagnosis not present

## 2021-11-12 DIAGNOSIS — E7800 Pure hypercholesterolemia, unspecified: Secondary | ICD-10-CM | POA: Diagnosis not present

## 2021-11-12 DIAGNOSIS — E109 Type 1 diabetes mellitus without complications: Secondary | ICD-10-CM | POA: Diagnosis not present

## 2021-11-12 DIAGNOSIS — E1140 Type 2 diabetes mellitus with diabetic neuropathy, unspecified: Secondary | ICD-10-CM | POA: Diagnosis not present

## 2021-11-14 ENCOUNTER — Encounter: Payer: Self-pay | Admitting: Infectious Disease

## 2021-11-14 ENCOUNTER — Other Ambulatory Visit: Payer: Self-pay

## 2021-11-14 ENCOUNTER — Ambulatory Visit: Payer: HMO | Admitting: Infectious Disease

## 2021-11-14 VITALS — BP 149/82 | HR 105 | Temp 98.3°F | Wt 278.0 lb

## 2021-11-14 DIAGNOSIS — G062 Extradural and subdural abscess, unspecified: Secondary | ICD-10-CM | POA: Diagnosis not present

## 2021-11-14 DIAGNOSIS — G061 Intraspinal abscess and granuloma: Secondary | ICD-10-CM | POA: Diagnosis not present

## 2021-11-14 DIAGNOSIS — E1165 Type 2 diabetes mellitus with hyperglycemia: Secondary | ICD-10-CM | POA: Diagnosis not present

## 2021-11-14 DIAGNOSIS — Z89431 Acquired absence of right foot: Secondary | ICD-10-CM

## 2021-11-14 NOTE — Progress Notes (Signed)
Subjective:  Chief complaint: Still has some lingering back pain but not nearly at the level she experienced during her severe infection  Patient ID: Brittany Silva, female    DOB: 18-Oct-1956, 65 y.o.   MRN: [id number]   HPI   65 y.o. female with  L4-5 facet joint and epidural abscess.  Status post IR guided aspiration with Strep mitis/oralis S to PCN switched to high dose penicillin. 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 Posey Pronto.  She completed IV penicillin and then was switched to amoxicillin  Her amputation site in the  foot has healed up well.   She completed her antibiotics and has been off of them since then--January of 2022. Pain had not worsened initally off antibiotics.    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    Her inflammatory markers remained normal off antibiotics when I saw her in March 2022  She  then stayed off antibiotics since then now 6 months later.  The interim prior to her last visit with me on August 21, 2021 she had developed more pain now she first noticed more pain when she was doing physical therapy back in March but it is now been worsening in the last month or 2 she is also noticed worsened constipation that seems to accompany her back pain.  Has some spasming at times it occurs as well.  Was relieved by rest and certain positions.    We rechecked inflammatory markers they were again reassuring.  She returns to clinic today to see me again and says pain is to typically again about a 2 out of 10 in severity waxing and waning.  She is "aware of it all the time and at times it can worsen to the point where she might have to take gabapentin or a Flexeril or Tylenol.  She has no fevers chills nausea vomiting or other symptoms to suggest systemic infection  Her amputation site clean it continues to be clean.     Past Medical History:  Diagnosis  Date   Ankle fracture    left   Anxiety    chronic   Cervical back pain with evidence of disc disease    Diabetes mellitus    Dyslipidemia    Edema    lower extremity   Facet arthropathy, lumbosacral 08/21/2021   GERD (gastroesophageal reflux disease)    Hyperlipidemia    Hypertension    Hypothyroid    Obstructive apnea    Split study 05-22-12 - patient was too claustrophobic and did not persue CPAP.    Osteoarthritis    PVC's (premature ventricular contractions)    Retinopathy    diabetic, mild, nonproliferative bilateral   TSH (thyroid-stimulating hormone deficiency)    evaluation off RX in 4/14   Vitamin D deficiency     Past Surgical History:  Procedure Laterality Date   ABDOMINAL HYSTERECTOMY     AMPUTATION Left 09/21/2020   Procedure: AMPUTATION LEFT 4TH TOE;  Surgeon: Felipa Furnace, DPM;  Location: [facility];  Service: Podiatry;  Laterality: Left;   APPLICATION OF A-CELL OF EXTREMITY Left 09/21/2020   Procedure: APPLICATION OF A-CELL, left foot;  Surgeon: Felipa Furnace, DPM;  Location: [facility];  Service: Podiatry;  Laterality: Left;   CATARACT EXTRACTION Bilateral 2012   EYE SURGERY Right 06/26/11   cataract removal Rt eye   FOOT SURGERY  Ankle fracture   index finger surgery  2007   spider bite   IR RADIOLOGIST EVAL & MGMT  09/14/2020   ORIF ANKLE FRACTURE Left 2006,2008   Right knee surgery  1966   torn cartilage   TONSILLECTOMY  1962   adenoidectomy   TUBAL LIGATION Bilateral 1986    Family History  Problem Relation Age of Onset   Coronary artery disease Father        MI at age 36   Hypertension Mother    Thyroid disease Mother    Ulcers Sister    Allergies Sister    Psoriasis Sister    Diabetes Maternal Grandfather    Heart attack Paternal Grandfather    Colon cancer Neg Hx       Social History   Socioeconomic History   Marital status: Married    Spouse name: Not on file   Number of children: 2   Years of education: 12   Highest education  level: Not on file  Occupational History    Employer: UNEMPLOYED    Comment: [employer]  Tobacco Use   Smoking status: Never   Smokeless tobacco: Never  Vaping Use   Vaping Use: Never used  Substance and Sexual Activity   Alcohol use: No   Drug use: No   Sexual activity: Not on file  Other Topics Concern   Not on file  Social History Narrative   Not on file   Social Determinants of Health   Financial Resource Strain: Not on file  Food Insecurity: Not on file  Transportation Needs: Not on file  Physical Activity: Not on file  Stress: Not on file  Social Connections: Not on file    Allergies  Allergen Reactions   Hydrochlorothiazide     rash   Lisinopril     Other reaction(s): Cough   Other Other (See Comments)   Adhesive [Tape]    Codeine Phosphate     REACTION: throat swelling   Darvon Other (See Comments)    Jittering, skin problems   Darvon [Propoxyphene Hcl]     Hyperactivity; "makes me crazy"   Hydrocodone-Acetaminophen Itching    *Has tolerated hydromorphone as an adult* Per pt, when she was a child had throat itching, difficulty breathing and feel strange and also vomit    Metformin Hcl Other (See Comments)   Polymyxin B    Propoxyphene Hcl     REACTION: Makes her crazy   Latex Rash   Neosporin [Neomycin-Bacitracin Zn-Polymyx] Rash   Sulfamethoxazole Itching and Rash     Current Outpatient Medications:    acetaminophen (TYLENOL) 500 MG tablet, Take 500-2,000 mg by mouth as needed for moderate pain. , Disp: , Rfl:    amLODipine (NORVASC) 5 MG tablet, Take 1 tablet (5 mg total) by mouth daily., Disp: 90 tablet, Rfl: 3   atorvastatin (LIPITOR) 40 MG tablet, Take 40 mg by mouth daily., Disp: , Rfl:    B-D ULTRAFINE III SHORT PEN 31G X 8 MM MISC, Inject into the skin as directed., Disp: , Rfl:    BD INSULIN SYRINGE U/F 31G X 5/16" 1 ML MISC, See admin instructions. with insulin, Disp: , Rfl:    Continuous Blood Gluc Sensor (FREESTYLE LIBRE 2 SENSOR)  MISC, See admin instructions., Disp: , Rfl:    cyclobenzaprine (FLEXERIL) 5 MG tablet, Take 5 mg by mouth 3 (three) times daily as needed., Disp: , Rfl:    DULoxetine (CYMBALTA) 30 MG capsule, Take 90 mg by mouth daily.,  Disp: , Rfl:    furosemide (LASIX) 20 MG tablet, Take 40 mg by mouth daily. , Disp: , Rfl:    gabapentin (NEURONTIN) 100 MG capsule, Take 200 mg by mouth 3 (three) times daily., Disp: , Rfl:    glucose blood (ONETOUCH VERIO) test strip, TEST BLOOD SUGAR THREE TIMES DAILY, Disp: , Rfl:    HUMALOG KWIKPEN 100 UNIT/ML KwikPen, Inject 15-25 Units into the skin in the morning, at noon, and at bedtime., Disp: , Rfl:    LEVEMIR 100 UNIT/ML injection, Inject 60 Units into the skin in the morning and at bedtime., Disp: , Rfl:    losartan (COZAAR) 100 MG tablet, , Disp: , Rfl:    meloxicam (MOBIC) 15 MG tablet, Take 15 mg by mouth daily., Disp: , Rfl:    NOVOFINE PEN NEEDLE 32G X 6 MM MISC, Inject into the skin daily., Disp: , Rfl:    ondansetron (ZOFRAN ODT) 4 MG disintegrating tablet, Take 1 tablet (4 mg total) by mouth every 8 (eight) hours as needed for nausea or vomiting., Disp: 15 tablet, Rfl: 0   potassium chloride (KLOR-CON) 8 MEQ tablet, Take 8 mEq by mouth 2 (two) times daily as needed (low potassium). , Disp: , Rfl:    SYNTHROID 75 MCG tablet, Take 75 mcg by mouth every morning., Disp: , Rfl:    traMADol (ULTRAM) 50 MG tablet, Take 1 tablet (50 mg total) by mouth every 8 (eight) hours., Disp: 15 tablet, Rfl: 0    Review of Systems  Constitutional:  Negative for activity change, appetite change, chills, diaphoresis, fatigue, fever and unexpected weight change.  HENT:  Negative for congestion, rhinorrhea, sinus pressure, sneezing, sore throat and trouble swallowing.   Eyes:  Negative for photophobia and visual disturbance.  Respiratory:  Negative for cough, chest tightness, shortness of breath, wheezing and stridor.   Cardiovascular:  Negative for chest pain, palpitations and  leg swelling.  Gastrointestinal:  Negative for abdominal distention, abdominal pain, anal bleeding, blood in stool, constipation, diarrhea, nausea and vomiting.  Genitourinary:  Negative for difficulty urinating, dysuria, flank pain and hematuria.  Musculoskeletal:  Positive for back pain. Negative for arthralgias, gait problem, joint swelling and myalgias.  Skin:  Negative for color change, pallor, rash and wound.  Neurological:  Negative for dizziness, tremors, weakness and light-headedness.  Hematological:  Negative for adenopathy. Does not bruise/bleed easily.  Psychiatric/Behavioral:  Negative for agitation, behavioral problems, confusion, decreased concentration, dysphoric mood and sleep disturbance.       Objective:   Physical Exam Constitutional:      General: She is not in acute distress.    Appearance: Normal appearance. She is well-developed. She is not ill-appearing or diaphoretic.  HENT:     Head: Normocephalic and atraumatic.     Right Ear: Hearing and external ear normal.     Left Ear: Hearing and external ear normal.     Nose: No nasal deformity or rhinorrhea.  Eyes:     General: No scleral icterus.    Conjunctiva/sclera: Conjunctivae normal.     Right eye: Right conjunctiva is not injected.     Left eye: Left conjunctiva is not injected.     Pupils: Pupils are equal, round, and reactive to light.  Neck:     Vascular: No JVD.  Cardiovascular:     Rate and Rhythm: Normal rate and regular rhythm.     Heart sounds: Normal heart sounds, S1 normal and S2 normal. No murmur heard.   No friction rub.  Abdominal:     General: Bowel sounds are normal. There is no distension.     Palpations: Abdomen is soft.     Tenderness: There is no abdominal tenderness.  Musculoskeletal:        General: Normal range of motion.     Right shoulder: Normal.     Left shoulder: Normal.     Cervical back: Normal range of motion and neck supple.     Right hip: Normal.     Left hip: Normal.      Right knee: Normal.     Left knee: Normal.  Lymphadenopathy:     Head:     Right side of head: No submandibular, preauricular or posterior auricular adenopathy.     Left side of head: No submandibular, preauricular or posterior auricular adenopathy.     Cervical: No cervical adenopathy.     Right cervical: No superficial or deep cervical adenopathy.    Left cervical: No superficial or deep cervical adenopathy.  Skin:    General: Skin is warm and dry.     Coloration: Skin is not pale.     Findings: No abrasion, bruising, ecchymosis, erythema, lesion or rash.     Nails: There is no clubbing.  Neurological:     General: No focal deficit present.     Mental Status: She is alert and oriented to person, place, and time.     Sensory: No sensory deficit.     Coordination: Coordination normal.     Gait: Gait normal.  Psychiatric:        Attention and Perception: She is attentive.        Mood and Affect: Mood normal.        Speech: Speech normal.        Behavior: Behavior normal. Behavior is cooperative.        Thought Content: Thought content normal.        Judgment: Judgment normal.    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  She was received both IV and oral antibiotics \ Clinically and lab wise she seems to resolve this infection.  I will check a sed rate and CRP BMP and CBC with differential today.  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 Posey Pronto   Diabetes mellitus with neuropathy: Continue to closely  monitor her feet and other sites for potential infection but particularly her feet since she has significant neuropathy present

## 2021-11-15 LAB — CBC WITH DIFFERENTIAL/PLATELET
Absolute Monocytes: 414 cells/uL (ref 200–950)
Basophils Absolute: 30 cells/uL (ref 0–200)
Basophils Relative: 0.5 %
Eosinophils Absolute: 354 cells/uL (ref 15–500)
Eosinophils Relative: 5.9 %
HCT: 42.1 % (ref 35.0–45.0)
Hemoglobin: 14.3 g/dL (ref 11.7–15.5)
Lymphs Abs: 864 cells/uL (ref 850–3900)
MCH: 30 pg (ref 27.0–33.0)
MCHC: 34 g/dL (ref 32.0–36.0)
MCV: 88.4 fL (ref 80.0–100.0)
MPV: 9.4 fL (ref 7.5–12.5)
Monocytes Relative: 6.9 %
Neutro Abs: 4338 cells/uL (ref 1500–7800)
Neutrophils Relative %: 72.3 %
Platelets: 209 10*3/uL (ref 140–400)
RBC: 4.76 10*6/uL (ref 3.80–5.10)
RDW: 12.4 % (ref 11.0–15.0)
Total Lymphocyte: 14.4 %
WBC: 6 10*3/uL (ref 3.8–10.8)

## 2021-11-15 LAB — BASIC METABOLIC PANEL WITH GFR
BUN: 17 mg/dL (ref 7–25)
CO2: 29 mmol/L (ref 20–32)
Calcium: 8.8 mg/dL (ref 8.6–10.4)
Chloride: 101 mmol/L (ref 98–110)
Creat: 0.82 mg/dL (ref 0.50–1.05)
Glucose, Bld: 188 mg/dL — ABNORMAL HIGH (ref 65–99)
Potassium: 4.3 mmol/L (ref 3.5–5.3)
Sodium: 138 mmol/L (ref 135–146)
eGFR: 79 mL/min/{1.73_m2} (ref >=60)

## 2021-11-15 LAB — SEDIMENTATION RATE: Sed Rate: 6 mm/h (ref 0–30)

## 2021-11-15 LAB — C-REACTIVE PROTEIN: CRP: 1.9 mg/L (ref <8.0)

## 2021-11-29 DIAGNOSIS — Z794 Long term (current) use of insulin: Secondary | ICD-10-CM | POA: Diagnosis not present

## 2021-11-29 DIAGNOSIS — F3341 Major depressive disorder, recurrent, in partial remission: Secondary | ICD-10-CM | POA: Diagnosis not present

## 2021-11-29 DIAGNOSIS — E1142 Type 2 diabetes mellitus with diabetic polyneuropathy: Secondary | ICD-10-CM | POA: Diagnosis not present

## 2021-11-29 DIAGNOSIS — B349 Viral infection, unspecified: Secondary | ICD-10-CM | POA: Diagnosis not present

## 2021-11-29 DIAGNOSIS — R2681 Unsteadiness on feet: Secondary | ICD-10-CM | POA: Diagnosis not present

## 2021-12-03 DIAGNOSIS — B349 Viral infection, unspecified: Secondary | ICD-10-CM | POA: Diagnosis not present

## 2022-01-02 DIAGNOSIS — E261 Secondary hyperaldosteronism: Secondary | ICD-10-CM | POA: Diagnosis not present

## 2022-01-02 DIAGNOSIS — E1169 Type 2 diabetes mellitus with other specified complication: Secondary | ICD-10-CM | POA: Diagnosis not present

## 2022-01-02 DIAGNOSIS — E1142 Type 2 diabetes mellitus with diabetic polyneuropathy: Secondary | ICD-10-CM | POA: Diagnosis not present

## 2022-01-02 DIAGNOSIS — E1151 Type 2 diabetes mellitus with diabetic peripheral angiopathy without gangrene: Secondary | ICD-10-CM | POA: Diagnosis not present

## 2022-01-02 DIAGNOSIS — I509 Heart failure, unspecified: Secondary | ICD-10-CM | POA: Diagnosis not present

## 2022-01-02 DIAGNOSIS — Z6841 Body Mass Index (BMI) 40.0 and over, adult: Secondary | ICD-10-CM | POA: Diagnosis not present

## 2022-01-02 DIAGNOSIS — E1139 Type 2 diabetes mellitus with other diabetic ophthalmic complication: Secondary | ICD-10-CM | POA: Diagnosis not present

## 2022-01-02 DIAGNOSIS — Z89422 Acquired absence of other left toe(s): Secondary | ICD-10-CM | POA: Diagnosis not present

## 2022-01-02 DIAGNOSIS — I110 Hypertensive heart disease with heart failure: Secondary | ICD-10-CM | POA: Diagnosis not present

## 2022-01-02 DIAGNOSIS — F3341 Major depressive disorder, recurrent, in partial remission: Secondary | ICD-10-CM | POA: Diagnosis not present

## 2022-01-02 DIAGNOSIS — Z794 Long term (current) use of insulin: Secondary | ICD-10-CM | POA: Diagnosis not present

## 2022-01-28 DIAGNOSIS — E113413 Type 2 diabetes mellitus with severe nonproliferative diabetic retinopathy with macular edema, bilateral: Secondary | ICD-10-CM | POA: Diagnosis not present

## 2022-01-28 DIAGNOSIS — H35372 Puckering of macula, left eye: Secondary | ICD-10-CM | POA: Diagnosis not present

## 2022-01-28 DIAGNOSIS — H43813 Vitreous degeneration, bilateral: Secondary | ICD-10-CM | POA: Diagnosis not present

## 2022-01-28 DIAGNOSIS — H35033 Hypertensive retinopathy, bilateral: Secondary | ICD-10-CM | POA: Diagnosis not present

## 2022-01-29 DIAGNOSIS — I10 Essential (primary) hypertension: Secondary | ICD-10-CM | POA: Diagnosis not present

## 2022-01-29 DIAGNOSIS — E1169 Type 2 diabetes mellitus with other specified complication: Secondary | ICD-10-CM | POA: Diagnosis not present

## 2022-01-29 DIAGNOSIS — E785 Hyperlipidemia, unspecified: Secondary | ICD-10-CM | POA: Diagnosis not present

## 2022-01-29 DIAGNOSIS — F3341 Major depressive disorder, recurrent, in partial remission: Secondary | ICD-10-CM | POA: Diagnosis not present

## 2022-01-29 DIAGNOSIS — Z794 Long term (current) use of insulin: Secondary | ICD-10-CM | POA: Diagnosis not present

## 2022-01-29 DIAGNOSIS — J0100 Acute maxillary sinusitis, unspecified: Secondary | ICD-10-CM | POA: Diagnosis not present

## 2022-02-22 DIAGNOSIS — I10 Essential (primary) hypertension: Secondary | ICD-10-CM | POA: Diagnosis not present

## 2022-02-22 DIAGNOSIS — E1065 Type 1 diabetes mellitus with hyperglycemia: Secondary | ICD-10-CM | POA: Diagnosis not present

## 2022-02-22 DIAGNOSIS — E1140 Type 2 diabetes mellitus with diabetic neuropathy, unspecified: Secondary | ICD-10-CM | POA: Diagnosis not present

## 2022-02-22 DIAGNOSIS — E7800 Pure hypercholesterolemia, unspecified: Secondary | ICD-10-CM | POA: Diagnosis not present

## 2022-02-22 DIAGNOSIS — E1165 Type 2 diabetes mellitus with hyperglycemia: Secondary | ICD-10-CM | POA: Diagnosis not present

## 2022-02-22 DIAGNOSIS — E039 Hypothyroidism, unspecified: Secondary | ICD-10-CM | POA: Diagnosis not present

## 2022-03-19 DIAGNOSIS — R234 Changes in skin texture: Secondary | ICD-10-CM | POA: Diagnosis not present

## 2022-03-19 DIAGNOSIS — M069 Rheumatoid arthritis, unspecified: Secondary | ICD-10-CM | POA: Diagnosis not present

## 2022-03-19 DIAGNOSIS — E1165 Type 2 diabetes mellitus with hyperglycemia: Secondary | ICD-10-CM | POA: Diagnosis not present

## 2022-03-19 DIAGNOSIS — I10 Essential (primary) hypertension: Secondary | ICD-10-CM | POA: Diagnosis not present

## 2022-03-19 DIAGNOSIS — E039 Hypothyroidism, unspecified: Secondary | ICD-10-CM | POA: Diagnosis not present

## 2022-03-19 DIAGNOSIS — F325 Major depressive disorder, single episode, in full remission: Secondary | ICD-10-CM | POA: Diagnosis not present

## 2022-04-12 DIAGNOSIS — H26493 Other secondary cataract, bilateral: Secondary | ICD-10-CM | POA: Diagnosis not present

## 2022-04-12 DIAGNOSIS — H04123 Dry eye syndrome of bilateral lacrimal glands: Secondary | ICD-10-CM | POA: Diagnosis not present

## 2022-04-12 DIAGNOSIS — E113412 Type 2 diabetes mellitus with severe nonproliferative diabetic retinopathy with macular edema, left eye: Secondary | ICD-10-CM | POA: Diagnosis not present

## 2022-04-12 DIAGNOSIS — H524 Presbyopia: Secondary | ICD-10-CM | POA: Diagnosis not present

## 2022-04-18 DIAGNOSIS — I10 Essential (primary) hypertension: Secondary | ICD-10-CM | POA: Diagnosis not present

## 2022-04-18 DIAGNOSIS — E1065 Type 1 diabetes mellitus with hyperglycemia: Secondary | ICD-10-CM | POA: Diagnosis not present

## 2022-04-18 DIAGNOSIS — E7800 Pure hypercholesterolemia, unspecified: Secondary | ICD-10-CM | POA: Diagnosis not present

## 2022-04-18 DIAGNOSIS — E039 Hypothyroidism, unspecified: Secondary | ICD-10-CM | POA: Diagnosis not present

## 2022-05-27 DIAGNOSIS — H35373 Puckering of macula, bilateral: Secondary | ICD-10-CM | POA: Diagnosis not present

## 2022-05-27 DIAGNOSIS — E113413 Type 2 diabetes mellitus with severe nonproliferative diabetic retinopathy with macular edema, bilateral: Secondary | ICD-10-CM | POA: Diagnosis not present

## 2022-05-27 DIAGNOSIS — H43813 Vitreous degeneration, bilateral: Secondary | ICD-10-CM | POA: Diagnosis not present

## 2022-05-27 DIAGNOSIS — H35033 Hypertensive retinopathy, bilateral: Secondary | ICD-10-CM | POA: Diagnosis not present

## 2022-07-24 DIAGNOSIS — Z1231 Encounter for screening mammogram for malignant neoplasm of breast: Secondary | ICD-10-CM | POA: Diagnosis not present

## 2022-08-26 DIAGNOSIS — E1065 Type 1 diabetes mellitus with hyperglycemia: Secondary | ICD-10-CM | POA: Diagnosis not present

## 2022-08-26 DIAGNOSIS — E039 Hypothyroidism, unspecified: Secondary | ICD-10-CM | POA: Diagnosis not present

## 2022-08-26 DIAGNOSIS — E1140 Type 2 diabetes mellitus with diabetic neuropathy, unspecified: Secondary | ICD-10-CM | POA: Diagnosis not present

## 2022-08-26 DIAGNOSIS — I10 Essential (primary) hypertension: Secondary | ICD-10-CM | POA: Diagnosis not present

## 2022-08-26 DIAGNOSIS — E1165 Type 2 diabetes mellitus with hyperglycemia: Secondary | ICD-10-CM | POA: Diagnosis not present

## 2023-01-02 DIAGNOSIS — E785 Hyperlipidemia, unspecified: Secondary | ICD-10-CM | POA: Diagnosis not present

## 2023-01-02 DIAGNOSIS — E1165 Type 2 diabetes mellitus with hyperglycemia: Secondary | ICD-10-CM | POA: Diagnosis not present

## 2023-01-02 DIAGNOSIS — E039 Hypothyroidism, unspecified: Secondary | ICD-10-CM | POA: Diagnosis not present

## 2023-01-02 DIAGNOSIS — I10 Essential (primary) hypertension: Secondary | ICD-10-CM | POA: Diagnosis not present

## 2023-01-02 DIAGNOSIS — F329 Major depressive disorder, single episode, unspecified: Secondary | ICD-10-CM | POA: Diagnosis not present

## 2023-01-07 ENCOUNTER — Ambulatory Visit: Payer: PPO | Admitting: Podiatry

## 2023-01-07 DIAGNOSIS — S90822A Blister (nonthermal), left foot, initial encounter: Secondary | ICD-10-CM | POA: Diagnosis not present

## 2023-01-07 DIAGNOSIS — L139 Bullous disorder, unspecified: Secondary | ICD-10-CM | POA: Diagnosis not present

## 2023-01-07 NOTE — Progress Notes (Signed)
Subjective:  Patient ID: Brittany Silva, female    DOB: 23-Aug-1956,  MRN: [id number]  Chief Complaint  Patient presents with   Blister    Patient has a left foot heel blister. Patient is a diabetic, she notice this a week ago. Patient felt that she put too much presence on her heel.     67 y.o. female presents with the above complaint.  Patient presents with new complaint left heel blister that came out of nowhere and has progressive gotten worse.  There are some swelling and redness associated with it.  She is a diabetic she noticed about a week ago.  She does put to put little too much pressure to the heel that may have aggravated superficial skin leading to blister formation.  She does not have much pain.  She wanted to get it evaluated.  She would like me to lance the blister.   Review of Systems: Negative except as noted in the HPI. Denies N/V/F/Ch.  Past Medical History:  Diagnosis Date   Ankle fracture    left   Anxiety    chronic   Cervical back pain with evidence of disc disease    Diabetes mellitus    Dyslipidemia    Edema    lower extremity   Facet arthropathy, lumbosacral 08/21/2021   GERD (gastroesophageal reflux disease)    Hyperlipidemia    Hypertension    Hypothyroid    Obstructive apnea    Split study 05-22-12 - patient was too claustrophobic and did not persue CPAP.    Osteoarthritis    PVC's (premature ventricular contractions)    Retinopathy    diabetic, mild, nonproliferative bilateral   TSH (thyroid-stimulating hormone deficiency)    evaluation off RX in 4/14   Vitamin D deficiency     Current Outpatient Medications:    acetaminophen (TYLENOL) 500 MG tablet, Take 500-2,000 mg by mouth as needed for moderate pain. , Disp: , Rfl:    amLODipine (NORVASC) 5 MG tablet, Take 1 tablet (5 mg total) by mouth daily., Disp: 90 tablet, Rfl: 3   atorvastatin (LIPITOR) 40 MG tablet, Take 40 mg by mouth daily., Disp: , Rfl:    B-D ULTRAFINE III SHORT PEN 31G X 8  MM MISC, Inject into the skin as directed., Disp: , Rfl:    BD INSULIN SYRINGE U/F 31G X 5/16" 1 ML MISC, See admin instructions. with insulin, Disp: , Rfl:    Continuous Blood Gluc Sensor (FREESTYLE LIBRE 2 SENSOR) MISC, See admin instructions., Disp: , Rfl:    cyclobenzaprine (FLEXERIL) 5 MG tablet, Take 5 mg by mouth 3 (three) times daily as needed., Disp: , Rfl:    DULoxetine (CYMBALTA) 30 MG capsule, Take 90 mg by mouth daily., Disp: , Rfl:    furosemide (LASIX) 20 MG tablet, Take 40 mg by mouth daily. , Disp: , Rfl:    gabapentin (NEURONTIN) 100 MG capsule, Take 200 mg by mouth 3 (three) times daily., Disp: , Rfl:    glucose blood (ONETOUCH VERIO) test strip, TEST BLOOD SUGAR THREE TIMES DAILY, Disp: , Rfl:    HUMALOG KWIKPEN 100 UNIT/ML KwikPen, Inject 15-25 Units into the skin in the morning, at noon, and at bedtime., Disp: , Rfl:    LEVEMIR 100 UNIT/ML injection, Inject 60 Units into the skin in the morning and at bedtime., Disp: , Rfl:    losartan (COZAAR) 100 MG tablet, , Disp: , Rfl:    meloxicam (MOBIC) 15 MG tablet, Take 15 mg by  mouth daily., Disp: , Rfl:    NOVOFINE PEN NEEDLE 32G X 6 MM MISC, Inject into the skin daily., Disp: , Rfl:    ondansetron (ZOFRAN ODT) 4 MG disintegrating tablet, Take 1 tablet (4 mg total) by mouth every 8 (eight) hours as needed for nausea or vomiting., Disp: 15 tablet, Rfl: 0   potassium chloride (KLOR-CON) 8 MEQ tablet, Take 8 mEq by mouth 2 (two) times daily as needed (low potassium). , Disp: , Rfl:    SYNTHROID 75 MCG tablet, Take 75 mcg by mouth every morning., Disp: , Rfl:    traMADol (ULTRAM) 50 MG tablet, Take 1 tablet (50 mg total) by mouth every 8 (eight) hours. (Patient not taking: Reported on 11/14/2021), Disp: 15 tablet, Rfl: 0  Social History   Tobacco Use  Smoking Status Never  Smokeless Tobacco Never    Allergies  Allergen Reactions   Hydrochlorothiazide     rash   Lisinopril     Other reaction(s): Cough   Other Other (See  Comments)   Adhesive [Tape]    Codeine Phosphate     REACTION: throat swelling   Darvon Other (See Comments)    Jittering, skin problems   Darvon [Propoxyphene Hcl]     Hyperactivity; "makes me crazy"   Hydrocodone-Acetaminophen Itching    *Has tolerated hydromorphone as an adult* Per pt, when she was a child had throat itching, difficulty breathing and feel strange and also vomit    Metformin Hcl Other (See Comments)   Polymyxin B    Propoxyphene Hcl     REACTION: Makes her crazy   Latex Rash   Neosporin [Neomycin-Bacitracin Zn-Polymyx] Rash   Sulfamethoxazole Itching and Rash   Objective:  There were no vitals filed for this visit. There is no height or weight on file to calculate BMI. Constitutional Well developed. Well nourished.  Vascular Dorsalis pedis pulses palpable bilaterally. Posterior tibial pulses palpable bilaterally. Capillary refill normal to all digits.  No cyanosis or clubbing noted. Pedal hair growth normal.  Neurologic Normal speech. Oriented to person, place, and time. Epicritic sensation to light touch grossly present bilaterally.  Dermatologic Left heel friction blister followed by Brittany Silva leading to serous drainage.  Underlying skin is pink and granular.  No deep open wounds or lesion noted.  No abnormalities noted.  Orthopedic: Normal joint ROM without pain or crepitus bilaterally. No visible deformities. No bony tenderness.   Radiographs: None Assessment:   1. Serous bulla of skin   2. Blister of left foot, initial encounter    Plan:  Patient was evaluated and treated and all questions answered.  Left heel blister status post Brittany Silva -All questions and concerns were discussed with the patient in extensive detail.  Given the size of the blister I believe she will benefit from lancing the blister to allow the drainage to be cleared.  Using number 18-gauge and Lantus was drained/blister was drained in standard technique no complication noted no  pinpoint bleeding noted..  She states understanding and would like to proceed with lancing. -She will benefit from Betadine wet-to-dry dressing.  I discussed with patient she states understanding  No follow-ups on file.  Left heel blister Betadine wet-to-dry dressing for few weeks

## 2023-02-06 DIAGNOSIS — E785 Hyperlipidemia, unspecified: Secondary | ICD-10-CM | POA: Diagnosis not present

## 2023-02-06 DIAGNOSIS — F329 Major depressive disorder, single episode, unspecified: Secondary | ICD-10-CM | POA: Diagnosis not present

## 2023-02-06 DIAGNOSIS — E039 Hypothyroidism, unspecified: Secondary | ICD-10-CM | POA: Diagnosis not present

## 2023-02-06 DIAGNOSIS — E1165 Type 2 diabetes mellitus with hyperglycemia: Secondary | ICD-10-CM | POA: Diagnosis not present

## 2023-02-06 DIAGNOSIS — I10 Essential (primary) hypertension: Secondary | ICD-10-CM | POA: Diagnosis not present

## 2023-02-26 DIAGNOSIS — I10 Essential (primary) hypertension: Secondary | ICD-10-CM | POA: Diagnosis not present

## 2023-02-26 DIAGNOSIS — E039 Hypothyroidism, unspecified: Secondary | ICD-10-CM | POA: Diagnosis not present

## 2023-02-26 DIAGNOSIS — E1065 Type 1 diabetes mellitus with hyperglycemia: Secondary | ICD-10-CM | POA: Diagnosis not present

## 2023-02-26 DIAGNOSIS — E1165 Type 2 diabetes mellitus with hyperglycemia: Secondary | ICD-10-CM | POA: Diagnosis not present

## 2023-02-26 DIAGNOSIS — E7800 Pure hypercholesterolemia, unspecified: Secondary | ICD-10-CM | POA: Diagnosis not present

## 2023-02-26 DIAGNOSIS — Z794 Long term (current) use of insulin: Secondary | ICD-10-CM | POA: Diagnosis not present

## 2023-02-26 DIAGNOSIS — G629 Polyneuropathy, unspecified: Secondary | ICD-10-CM | POA: Diagnosis not present

## 2023-03-25 DIAGNOSIS — H35373 Puckering of macula, bilateral: Secondary | ICD-10-CM | POA: Diagnosis not present

## 2023-03-25 DIAGNOSIS — H43813 Vitreous degeneration, bilateral: Secondary | ICD-10-CM | POA: Diagnosis not present

## 2023-03-25 DIAGNOSIS — H35033 Hypertensive retinopathy, bilateral: Secondary | ICD-10-CM | POA: Diagnosis not present

## 2023-03-25 DIAGNOSIS — E113413 Type 2 diabetes mellitus with severe nonproliferative diabetic retinopathy with macular edema, bilateral: Secondary | ICD-10-CM | POA: Diagnosis not present

## 2023-04-08 DIAGNOSIS — H35033 Hypertensive retinopathy, bilateral: Secondary | ICD-10-CM | POA: Diagnosis not present

## 2023-04-08 DIAGNOSIS — H35373 Puckering of macula, bilateral: Secondary | ICD-10-CM | POA: Diagnosis not present

## 2023-04-08 DIAGNOSIS — E113511 Type 2 diabetes mellitus with proliferative diabetic retinopathy with macular edema, right eye: Secondary | ICD-10-CM | POA: Diagnosis not present

## 2023-04-08 DIAGNOSIS — E113412 Type 2 diabetes mellitus with severe nonproliferative diabetic retinopathy with macular edema, left eye: Secondary | ICD-10-CM | POA: Diagnosis not present

## 2023-04-08 DIAGNOSIS — H43813 Vitreous degeneration, bilateral: Secondary | ICD-10-CM | POA: Diagnosis not present

## 2023-04-14 DIAGNOSIS — H26493 Other secondary cataract, bilateral: Secondary | ICD-10-CM | POA: Diagnosis not present

## 2023-04-14 DIAGNOSIS — H5213 Myopia, bilateral: Secondary | ICD-10-CM | POA: Diagnosis not present

## 2023-04-14 DIAGNOSIS — E039 Hypothyroidism, unspecified: Secondary | ICD-10-CM | POA: Diagnosis not present

## 2023-04-14 DIAGNOSIS — E113493 Type 2 diabetes mellitus with severe nonproliferative diabetic retinopathy without macular edema, bilateral: Secondary | ICD-10-CM | POA: Diagnosis not present

## 2023-04-14 DIAGNOSIS — Z89422 Acquired absence of other left toe(s): Secondary | ICD-10-CM | POA: Diagnosis not present

## 2023-04-14 DIAGNOSIS — H04123 Dry eye syndrome of bilateral lacrimal glands: Secondary | ICD-10-CM | POA: Diagnosis not present

## 2023-04-14 DIAGNOSIS — I10 Essential (primary) hypertension: Secondary | ICD-10-CM | POA: Diagnosis not present

## 2023-04-14 DIAGNOSIS — Z9989 Dependence on other enabling machines and devices: Secondary | ICD-10-CM | POA: Diagnosis not present

## 2023-04-14 DIAGNOSIS — Z794 Long term (current) use of insulin: Secondary | ICD-10-CM | POA: Diagnosis not present

## 2023-04-14 DIAGNOSIS — F325 Major depressive disorder, single episode, in full remission: Secondary | ICD-10-CM | POA: Diagnosis not present

## 2023-04-14 DIAGNOSIS — H52203 Unspecified astigmatism, bilateral: Secondary | ICD-10-CM | POA: Diagnosis not present

## 2023-04-14 DIAGNOSIS — G4733 Obstructive sleep apnea (adult) (pediatric): Secondary | ICD-10-CM | POA: Diagnosis not present

## 2023-04-14 DIAGNOSIS — Z961 Presence of intraocular lens: Secondary | ICD-10-CM | POA: Diagnosis not present

## 2023-04-14 DIAGNOSIS — E1140 Type 2 diabetes mellitus with diabetic neuropathy, unspecified: Secondary | ICD-10-CM | POA: Diagnosis not present

## 2023-04-14 DIAGNOSIS — E11319 Type 2 diabetes mellitus with unspecified diabetic retinopathy without macular edema: Secondary | ICD-10-CM | POA: Diagnosis not present

## 2023-04-14 DIAGNOSIS — M069 Rheumatoid arthritis, unspecified: Secondary | ICD-10-CM | POA: Diagnosis not present

## 2023-04-14 DIAGNOSIS — Z6841 Body Mass Index (BMI) 40.0 and over, adult: Secondary | ICD-10-CM | POA: Diagnosis not present

## 2023-04-14 DIAGNOSIS — H524 Presbyopia: Secondary | ICD-10-CM | POA: Diagnosis not present

## 2023-04-15 DIAGNOSIS — E113511 Type 2 diabetes mellitus with proliferative diabetic retinopathy with macular edema, right eye: Secondary | ICD-10-CM | POA: Diagnosis not present

## 2023-04-29 DIAGNOSIS — E113511 Type 2 diabetes mellitus with proliferative diabetic retinopathy with macular edema, right eye: Secondary | ICD-10-CM | POA: Diagnosis not present

## 2023-07-03 DIAGNOSIS — E7800 Pure hypercholesterolemia, unspecified: Secondary | ICD-10-CM | POA: Diagnosis not present

## 2023-07-03 DIAGNOSIS — E039 Hypothyroidism, unspecified: Secondary | ICD-10-CM | POA: Diagnosis not present

## 2023-07-03 DIAGNOSIS — I10 Essential (primary) hypertension: Secondary | ICD-10-CM | POA: Diagnosis not present

## 2023-07-03 DIAGNOSIS — E1065 Type 1 diabetes mellitus with hyperglycemia: Secondary | ICD-10-CM | POA: Diagnosis not present

## 2023-07-03 DIAGNOSIS — G629 Polyneuropathy, unspecified: Secondary | ICD-10-CM | POA: Diagnosis not present

## 2023-07-22 DIAGNOSIS — R0981 Nasal congestion: Secondary | ICD-10-CM | POA: Diagnosis not present

## 2023-07-22 DIAGNOSIS — U071 COVID-19: Secondary | ICD-10-CM | POA: Diagnosis not present

## 2023-09-01 DIAGNOSIS — E113412 Type 2 diabetes mellitus with severe nonproliferative diabetic retinopathy with macular edema, left eye: Secondary | ICD-10-CM | POA: Diagnosis not present

## 2023-09-01 DIAGNOSIS — H35373 Puckering of macula, bilateral: Secondary | ICD-10-CM | POA: Diagnosis not present

## 2023-09-01 DIAGNOSIS — E113511 Type 2 diabetes mellitus with proliferative diabetic retinopathy with macular edema, right eye: Secondary | ICD-10-CM | POA: Diagnosis not present

## 2023-09-01 DIAGNOSIS — H35033 Hypertensive retinopathy, bilateral: Secondary | ICD-10-CM | POA: Diagnosis not present

## 2023-09-01 DIAGNOSIS — H43813 Vitreous degeneration, bilateral: Secondary | ICD-10-CM | POA: Diagnosis not present

## 2023-09-01 DIAGNOSIS — E1065 Type 1 diabetes mellitus with hyperglycemia: Secondary | ICD-10-CM | POA: Diagnosis not present

## 2023-09-05 DIAGNOSIS — Z23 Encounter for immunization: Secondary | ICD-10-CM | POA: Diagnosis not present

## 2023-09-30 DIAGNOSIS — Z7952 Long term (current) use of systemic steroids: Secondary | ICD-10-CM | POA: Diagnosis not present

## 2023-09-30 DIAGNOSIS — Z1231 Encounter for screening mammogram for malignant neoplasm of breast: Secondary | ICD-10-CM | POA: Diagnosis not present

## 2023-09-30 DIAGNOSIS — E1165 Type 2 diabetes mellitus with hyperglycemia: Secondary | ICD-10-CM | POA: Diagnosis not present

## 2023-09-30 DIAGNOSIS — R29890 Loss of height: Secondary | ICD-10-CM | POA: Diagnosis not present

## 2023-09-30 DIAGNOSIS — M069 Rheumatoid arthritis, unspecified: Secondary | ICD-10-CM | POA: Diagnosis not present

## 2023-11-04 DIAGNOSIS — F325 Major depressive disorder, single episode, in full remission: Secondary | ICD-10-CM | POA: Diagnosis not present

## 2023-11-04 DIAGNOSIS — I10 Essential (primary) hypertension: Secondary | ICD-10-CM | POA: Diagnosis not present

## 2023-11-04 DIAGNOSIS — E559 Vitamin D deficiency, unspecified: Secondary | ICD-10-CM | POA: Diagnosis not present

## 2023-11-04 DIAGNOSIS — E039 Hypothyroidism, unspecified: Secondary | ICD-10-CM | POA: Diagnosis not present

## 2023-11-04 DIAGNOSIS — Z1331 Encounter for screening for depression: Secondary | ICD-10-CM | POA: Diagnosis not present

## 2023-11-04 DIAGNOSIS — S98132A Complete traumatic amputation of one left lesser toe, initial encounter: Secondary | ICD-10-CM | POA: Diagnosis not present

## 2023-11-04 DIAGNOSIS — Z0000 Encounter for general adult medical examination without abnormal findings: Secondary | ICD-10-CM | POA: Diagnosis not present

## 2023-11-04 DIAGNOSIS — M069 Rheumatoid arthritis, unspecified: Secondary | ICD-10-CM | POA: Diagnosis not present

## 2023-11-04 DIAGNOSIS — E1142 Type 2 diabetes mellitus with diabetic polyneuropathy: Secondary | ICD-10-CM | POA: Diagnosis not present

## 2023-11-04 DIAGNOSIS — E785 Hyperlipidemia, unspecified: Secondary | ICD-10-CM | POA: Diagnosis not present

## 2023-11-04 DIAGNOSIS — E1165 Type 2 diabetes mellitus with hyperglycemia: Secondary | ICD-10-CM | POA: Diagnosis not present

## 2023-11-04 DIAGNOSIS — E113293 Type 2 diabetes mellitus with mild nonproliferative diabetic retinopathy without macular edema, bilateral: Secondary | ICD-10-CM | POA: Diagnosis not present

## 2024-02-10 DIAGNOSIS — E1065 Type 1 diabetes mellitus with hyperglycemia: Secondary | ICD-10-CM | POA: Diagnosis not present

## 2024-02-10 DIAGNOSIS — E039 Hypothyroidism, unspecified: Secondary | ICD-10-CM | POA: Diagnosis not present

## 2024-02-10 DIAGNOSIS — E7800 Pure hypercholesterolemia, unspecified: Secondary | ICD-10-CM | POA: Diagnosis not present

## 2024-02-17 DIAGNOSIS — G629 Polyneuropathy, unspecified: Secondary | ICD-10-CM | POA: Diagnosis not present

## 2024-02-17 DIAGNOSIS — E039 Hypothyroidism, unspecified: Secondary | ICD-10-CM | POA: Diagnosis not present

## 2024-02-17 DIAGNOSIS — I10 Essential (primary) hypertension: Secondary | ICD-10-CM | POA: Diagnosis not present

## 2024-02-17 DIAGNOSIS — E1065 Type 1 diabetes mellitus with hyperglycemia: Secondary | ICD-10-CM | POA: Diagnosis not present

## 2024-02-17 DIAGNOSIS — E7800 Pure hypercholesterolemia, unspecified: Secondary | ICD-10-CM | POA: Diagnosis not present

## 2024-03-09 DIAGNOSIS — E1065 Type 1 diabetes mellitus with hyperglycemia: Secondary | ICD-10-CM | POA: Diagnosis not present

## 2024-03-09 DIAGNOSIS — I10 Essential (primary) hypertension: Secondary | ICD-10-CM | POA: Diagnosis not present

## 2024-03-09 DIAGNOSIS — Z4681 Encounter for fitting and adjustment of insulin pump: Secondary | ICD-10-CM | POA: Diagnosis not present

## 2024-03-09 DIAGNOSIS — E039 Hypothyroidism, unspecified: Secondary | ICD-10-CM | POA: Diagnosis not present

## 2024-03-09 DIAGNOSIS — E7800 Pure hypercholesterolemia, unspecified: Secondary | ICD-10-CM | POA: Diagnosis not present

## 2024-03-09 DIAGNOSIS — G629 Polyneuropathy, unspecified: Secondary | ICD-10-CM | POA: Diagnosis not present

## 2024-04-06 DIAGNOSIS — H35033 Hypertensive retinopathy, bilateral: Secondary | ICD-10-CM | POA: Diagnosis not present

## 2024-04-06 DIAGNOSIS — E113412 Type 2 diabetes mellitus with severe nonproliferative diabetic retinopathy with macular edema, left eye: Secondary | ICD-10-CM | POA: Diagnosis not present

## 2024-04-06 DIAGNOSIS — E113591 Type 2 diabetes mellitus with proliferative diabetic retinopathy without macular edema, right eye: Secondary | ICD-10-CM | POA: Diagnosis not present

## 2024-04-06 DIAGNOSIS — H35373 Puckering of macula, bilateral: Secondary | ICD-10-CM | POA: Diagnosis not present

## 2024-04-06 DIAGNOSIS — H43813 Vitreous degeneration, bilateral: Secondary | ICD-10-CM | POA: Diagnosis not present

## 2024-04-06 DIAGNOSIS — Z961 Presence of intraocular lens: Secondary | ICD-10-CM | POA: Diagnosis not present

## 2024-04-27 DIAGNOSIS — E1065 Type 1 diabetes mellitus with hyperglycemia: Secondary | ICD-10-CM | POA: Diagnosis not present

## 2024-04-27 DIAGNOSIS — I10 Essential (primary) hypertension: Secondary | ICD-10-CM | POA: Diagnosis not present

## 2024-04-27 DIAGNOSIS — Z4681 Encounter for fitting and adjustment of insulin pump: Secondary | ICD-10-CM | POA: Diagnosis not present

## 2024-04-27 DIAGNOSIS — L97509 Non-pressure chronic ulcer of other part of unspecified foot with unspecified severity: Secondary | ICD-10-CM | POA: Diagnosis not present

## 2024-04-27 DIAGNOSIS — E1140 Type 2 diabetes mellitus with diabetic neuropathy, unspecified: Secondary | ICD-10-CM | POA: Diagnosis not present

## 2024-04-27 DIAGNOSIS — E039 Hypothyroidism, unspecified: Secondary | ICD-10-CM | POA: Diagnosis not present

## 2024-04-27 DIAGNOSIS — Z794 Long term (current) use of insulin: Secondary | ICD-10-CM | POA: Diagnosis not present

## 2024-04-27 DIAGNOSIS — E7800 Pure hypercholesterolemia, unspecified: Secondary | ICD-10-CM | POA: Diagnosis not present

## 2024-05-04 DIAGNOSIS — M069 Rheumatoid arthritis, unspecified: Secondary | ICD-10-CM | POA: Diagnosis not present

## 2024-05-04 DIAGNOSIS — I10 Essential (primary) hypertension: Secondary | ICD-10-CM | POA: Diagnosis not present

## 2024-05-04 DIAGNOSIS — K5901 Slow transit constipation: Secondary | ICD-10-CM | POA: Diagnosis not present

## 2024-05-04 DIAGNOSIS — F325 Major depressive disorder, single episode, in full remission: Secondary | ICD-10-CM | POA: Diagnosis not present

## 2024-05-04 DIAGNOSIS — E1169 Type 2 diabetes mellitus with other specified complication: Secondary | ICD-10-CM | POA: Diagnosis not present

## 2024-05-04 DIAGNOSIS — E1142 Type 2 diabetes mellitus with diabetic polyneuropathy: Secondary | ICD-10-CM | POA: Diagnosis not present

## 2024-05-04 DIAGNOSIS — E113413 Type 2 diabetes mellitus with severe nonproliferative diabetic retinopathy with macular edema, bilateral: Secondary | ICD-10-CM | POA: Diagnosis not present

## 2024-05-04 DIAGNOSIS — N3281 Overactive bladder: Secondary | ICD-10-CM | POA: Diagnosis not present

## 2024-05-04 DIAGNOSIS — K219 Gastro-esophageal reflux disease without esophagitis: Secondary | ICD-10-CM | POA: Diagnosis not present

## 2024-05-04 DIAGNOSIS — E785 Hyperlipidemia, unspecified: Secondary | ICD-10-CM | POA: Diagnosis not present

## 2024-05-07 DIAGNOSIS — E1065 Type 1 diabetes mellitus with hyperglycemia: Secondary | ICD-10-CM | POA: Diagnosis not present

## 2024-05-07 DIAGNOSIS — I10 Essential (primary) hypertension: Secondary | ICD-10-CM | POA: Diagnosis not present

## 2024-05-07 DIAGNOSIS — E1140 Type 2 diabetes mellitus with diabetic neuropathy, unspecified: Secondary | ICD-10-CM | POA: Diagnosis not present

## 2024-05-07 DIAGNOSIS — Z4681 Encounter for fitting and adjustment of insulin pump: Secondary | ICD-10-CM | POA: Diagnosis not present

## 2024-05-07 DIAGNOSIS — E7800 Pure hypercholesterolemia, unspecified: Secondary | ICD-10-CM | POA: Diagnosis not present

## 2024-05-07 DIAGNOSIS — E039 Hypothyroidism, unspecified: Secondary | ICD-10-CM | POA: Diagnosis not present

## 2024-05-07 DIAGNOSIS — Z794 Long term (current) use of insulin: Secondary | ICD-10-CM | POA: Diagnosis not present

## 2024-06-15 DIAGNOSIS — E1165 Type 2 diabetes mellitus with hyperglycemia: Secondary | ICD-10-CM | POA: Diagnosis not present

## 2024-06-15 DIAGNOSIS — R1013 Epigastric pain: Secondary | ICD-10-CM | POA: Diagnosis not present

## 2024-06-15 DIAGNOSIS — I10 Essential (primary) hypertension: Secondary | ICD-10-CM | POA: Diagnosis not present

## 2024-06-16 DIAGNOSIS — H52203 Unspecified astigmatism, bilateral: Secondary | ICD-10-CM | POA: Diagnosis not present

## 2024-06-16 DIAGNOSIS — E113393 Type 2 diabetes mellitus with moderate nonproliferative diabetic retinopathy without macular edema, bilateral: Secondary | ICD-10-CM | POA: Diagnosis not present

## 2024-06-16 DIAGNOSIS — H04123 Dry eye syndrome of bilateral lacrimal glands: Secondary | ICD-10-CM | POA: Diagnosis not present

## 2024-06-16 DIAGNOSIS — H43813 Vitreous degeneration, bilateral: Secondary | ICD-10-CM | POA: Diagnosis not present

## 2024-06-16 DIAGNOSIS — H26493 Other secondary cataract, bilateral: Secondary | ICD-10-CM | POA: Diagnosis not present

## 2024-06-28 DIAGNOSIS — R55 Syncope and collapse: Secondary | ICD-10-CM | POA: Diagnosis not present

## 2024-07-02 ENCOUNTER — Telehealth: Payer: Self-pay

## 2024-07-02 ENCOUNTER — Encounter: Payer: Self-pay | Admitting: Nurse Practitioner

## 2024-07-02 ENCOUNTER — Ambulatory Visit: Attending: Nurse Practitioner | Admitting: Nurse Practitioner

## 2024-07-02 VITALS — BP 174/76 | HR 74 | Ht 67.5 in | Wt 250.6 lb

## 2024-07-02 DIAGNOSIS — Z8249 Family history of ischemic heart disease and other diseases of the circulatory system: Secondary | ICD-10-CM

## 2024-07-02 DIAGNOSIS — R55 Syncope and collapse: Secondary | ICD-10-CM

## 2024-07-02 DIAGNOSIS — E785 Hyperlipidemia, unspecified: Secondary | ICD-10-CM | POA: Diagnosis not present

## 2024-07-02 DIAGNOSIS — E1165 Type 2 diabetes mellitus with hyperglycemia: Secondary | ICD-10-CM

## 2024-07-02 DIAGNOSIS — E039 Hypothyroidism, unspecified: Secondary | ICD-10-CM | POA: Diagnosis not present

## 2024-07-02 DIAGNOSIS — I10 Essential (primary) hypertension: Secondary | ICD-10-CM | POA: Diagnosis not present

## 2024-07-02 DIAGNOSIS — G4733 Obstructive sleep apnea (adult) (pediatric): Secondary | ICD-10-CM

## 2024-07-02 DIAGNOSIS — I951 Orthostatic hypotension: Secondary | ICD-10-CM

## 2024-07-02 NOTE — Telephone Encounter (Signed)
 Pt was seen in office today 07/02/24 and needs an echocardiogram scheduled. Please arrange. Pts f/u was scheduled prior to pt leaving. Thank you.

## 2024-07-02 NOTE — Patient Instructions (Addendum)
 Medication Instructions:  Stop Amlodipine  as directed  *If you need a refill on your cardiac medications before your next appointment, please call your pharmacy*  Lab Work: NONE ordered at this time of appointment   Testing/Procedures: Your physician has requested that you have an echocardiogram. Echocardiography is a painless test that uses sound waves to create images of your heart. It provides your doctor with information about the size and shape of your heart and how well your heart's chambers and valves are working. This procedure takes approximately one hour. There are no restrictions for this procedure. Please do NOT wear cologne, perfume, aftershave, or lotions (deodorant is allowed). Please arrive 15 minutes prior to your appointment time.  Please note: We ask at that you not bring children with you during ultrasound (echo/ vascular) testing. Due to room size and safety concerns, children are not allowed in the ultrasound rooms during exams. Our front office staff cannot provide observation of children in our lobby area while testing is being conducted. An adult accompanying a patient to their appointment will only be allowed in the ultrasound room at the discretion of the ultrasound technician under special circumstances. We apologize for any inconvenience.  [employer] Cardiac Event Monitor Instructions  Your physician has requested you wear your cardiac event monitor for 30 days.  [employer] may call or text to confirm a shipping address. The monitor will be sent to a land address via UPS. [employer] will not ship a monitor to a PO BOX. It typically takes 3-5 days to receive your monitor after it has been enrolled. [employer] will assist with USPS tracking if your package is delayed. The telephone number for [employer] is [phone]. Once you have received your monitor, please review the enclosed instructions. Instruction tutorials can also be viewed under help and settings on  the enclosed cell phone. Your monitor has already been registered assigning a specific monitor serial # to you.  Billing and Self Pay Discount Information  [employer] has been provided the insurance information we had on file for you.  If your insurance has been updated, please call [employer] at [phone] to provide them with your updated insurance information.   [employer] offers a discounted Self Pay option for patients who have insurance that does not cover their cardiac event monitor or patients without insurance.  The discounted cost of a Self Pay Cardiac Event Monitor would be $225.00 , if the patient contacts [employer] at [phone] within 7 days of applying the monitor to make payment arrangements.  If the patient does not contact [employer] within 7 days of applying the monitor, the cost of the cardiac event monitor will be $350.00.  Applying the monitor  Remove cell phone from case and turn it on. The cell phone works as IT consultant and needs to be within [employer] of you at all times. The cell phone will need to be charged on a daily basis. We recommend you plug the cell phone into the enclosed charger at your bedside table every night.  Monitor batteries: You will receive two monitor batteries labelled #1 and #2. These are your recorders. Plug battery #2 onto the second connection on the enclosed charger. Keep one battery on the charger at all times. This will keep the monitor battery deactivated. It will also keep it fully charged for when you need to switch your monitor batteries. A small light will be blinking on the battery emblem when it is charging. The light on the battery emblem will remain on when  the battery is fully charged.  Open package of a Monitor strip. Insert battery #1 into black hood on strip and gently squeeze monitor battery onto connection as indicated in instruction booklet. Set aside while preparing skin.  Choose location for your strip, vertical  or horizontal, as indicated in the instruction booklet. Shave to remove all hair from location. There cannot be any lotions, oils, powders, or colognes on skin where monitor is to be applied. Wipe skin clean with enclosed Saline wipe. Dry skin completely.  Peel paper labeled #1 off the back of the Monitor strip exposing the adhesive. Place the monitor on the chest in the vertical or horizontal position shown in the instruction booklet. One arrow on the monitor strip must be pointing upward. Carefully remove paper labeled #2, attaching remainder of strip to your skin. Try not to create any folds or wrinkles in the strip as you apply it.  Firmly press and release the circle in the center of the monitor battery. You will hear a small beep. This is turning the monitor battery on. The heart emblem on the monitor battery will light up every 5 seconds if the monitor battery in turned on and connected to the patient securely. Do not push and hold the circle down as this turns the monitor battery off. The cell phone will locate the monitor battery. A screen will appear on the cell phone checking the connection of your monitor strip. This may read poor connection initially but change to good connection within the next minute. Once your monitor accepts the connection you will hear a series of 3 beeps followed by a climbing crescendo of beeps. A screen will appear on the cell phone showing the two monitor strip placement options. Touch the picture that demonstrates where you applied the monitor strip.  Your monitor strip and battery are waterproof. You are able to shower, bathe, or swim with the monitor on. They just ask you do not submerge deeper than 3 feet underwater. We recommend removing the monitor if you are swimming in a lake, river, or ocean.  Your monitor battery will need to be switched to a fully charged monitor battery approximately once a week. The cell phone will alert you of an action  which needs to be made.  On the cell phone, tap for details to reveal connection status, monitor battery status, and cell phone battery status. The green dots indicates your monitor is in good status. A red dot indicates there is something that needs your attention.  To record a symptom, click the circle on the monitor battery. In 30-60 seconds a list of symptoms will appear on the cell phone. Select your symptom and tap save. Your monitor will record a sustained or significant arrhythmia regardless of you clicking the button. Some patients do not feel the heart rhythm irregularities. [employer] will notify us  of any serious or critical events.  Refer to instruction booklet for instructions on switching batteries, changing strips, the Do not disturb or Pause features, or any additional questions.  Call [employer] at [phone], to confirm your monitor is transmitting and record your baseline. They will answer any questions you may have regarding the monitor instructions at that time.  Returning the monitor to [employer]  Place all equipment back into blue box. Peel off strip of paper to expose adhesive and close box securely. There is a prepaid UPS shipping label on this box. Drop in a UPS drop box, or at a UPS facility like [employer]. You may  also contact [employer] to arrange UPS to pick up monitor package at your home.    Follow-Up: At [facility], you and your health needs are our priority.  As part of our continuing mission to provide you with exceptional heart care, our providers are all part of one team.  This team includes your primary Cardiologist (physician) and Advanced Practice Providers or APPs (Physician Assistants and Nurse Practitioners) who all work together to provide you with the care you need, when you need it.  Your next appointment:   2-3 month(s)  Provider:   Dr. Swazil[place] or Damien Braver, NP          We recommend signing up for the patient portal  called MyChart.  Sign up information is provided on this After Visit Summary.  MyChart is used to connect with patients for Virtual Visits (Telemedicine).  Patients are able to view lab/test results, encounter notes, upcoming appointments, etc.  Non-urgent messages can be sent to your provider as well.   To learn more about what you can do with MyChart, go to [email or web address].   Other Instructions Monitor Blood pressure.

## 2024-07-02 NOTE — Progress Notes (Signed)
 Office Visit    Patient Name: Brittany Silva Date of Encounter: 07/02/2024  Primary Care Provider:  Claudene Pellet, MD Primary Cardiologist:  Peter Swaziland, MD  Chief Complaint    68 year old female with a history of recurrent near syncope, family history of CAD, lower extremity edema, hypertension, hyperlipidemia, type 2 diabetes,  peripheral neuropathy, diabetic retinopathy, hypothyroidism, OSA not on CPAP, anxiety, depression, GERD, and rheumatoid arthritis presents for [facility] new patient evaluation in the setting of recurrent near syncope.  Past Medical History    Past Medical History:  Diagnosis Date   Ankle fracture    left   Anxiety    chronic   Cervical back pain with evidence of disc disease    Diabetes mellitus    Dyslipidemia    Edema    lower extremity   Facet arthropathy, lumbosacral 08/21/2021   GERD (gastroesophageal reflux disease)    Hyperlipidemia    Hypertension    Hypothyroid    Obstructive apnea    Split study 05-22-12 - patient was too claustrophobic and did not persue CPAP.    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;  Surgeon: Tobie Franky SQUIBB, DPM;  Location: [facility] OR;  Service: Podiatry;  Laterality: Left;   APPLICATION OF A-CELL OF EXTREMITY Left 09/21/2020   Procedure: APPLICATION OF A-CELL, left foot;  Surgeon: Tobie Franky SQUIBB, DPM;  Location: [facility] OR;  Service: Podiatry;  Laterality: Left;   CATARACT EXTRACTION Bilateral 2012   EYE SURGERY Right 06/26/11   cataract removal Rt eye   FOOT SURGERY     Ankle fracture   index finger surgery  2007   spider bite   IR RADIOLOGIST EVAL & MGMT  09/14/2020   ORIF ANKLE  FRACTURE Left 2006,2008   Right knee surgery  1966   torn cartilage   TONSILLECTOMY  1962   adenoidectomy   TUBAL LIGATION Bilateral 1986    Allergies  Allergies  Allergen Reactions   Hydrochlorothiazide     rash   Lisinopril     Other reaction(s): Cough   Other Other (See Comments)   Adhesive [Tape]    Codeine Phosphate     REACTION: throat swelling   Darvon Other (See Comments)    Jittering, skin problems   Darvon [Propoxyphene Hcl]     Hyperactivity; makes me crazy   Hydrocodone-Acetaminophen  Itching    *Has tolerated hydromorphone  as an adult* Per pt, when she was a child had throat itching, difficulty breathing and feel strange and also vomit    Metformin Hcl Other (See Comments)   Polymyxin B    Propoxyphene Hcl     REACTION: Makes her crazy   Latex Rash   Neosporin [Neomycin-Bacitracin Zn-Polymyx] Rash   Sulfamethoxazole Itching and Rash     Labs/Other Studies Reviewed    The following studies were reviewed today:  Cardiac Studies & Procedures   ______________________________________________________________________________________________     ECHOCARDIOGRAM  ECHOCARDIOGRAM COMPLETE 09/15/2020  Narrative ECHOCARDIOGRAM REPORT    Patient Name:   Brittany Silva Date of Exam: 09/15/2020 Medical Rec #:  [id number]        Height:  67.0 in Accession #:    [id number]       Weight:       285.0 lb Date of Birth:  [birth year]        BSA:          2.351 m Patient Age:    64 years         BP:           141/75 mmHg Patient Gender: F                HR:           80 bpm. Exam Location:  Inpatient  Procedure: 2D Echo  Indications:    Fever 780.6 / R50.9  History:        Patient has no prior history of Echocardiogram examinations. Risk Factors:Diabetes and Hypertension. History of chronic back pain, thyroid  disease. Presenting with back pain, bilateral leg pain, nausea, vomiting, fever and chills. Acute cystitis. Epidural abscess.  Sonographer:     Annabella Fell RDCS Referring Phys: [id number] STEPHANIE N DIXON  IMPRESSIONS   1. Left ventricular ejection fraction, by estimation, is 60 to 65%. The left ventricle has normal function. The left ventricle has no regional wall motion abnormalities. There is mild left ventricular hypertrophy. Left ventricular diastolic parameters were normal. 2. Right ventricular systolic function is normal. The right ventricular size is normal. Tricuspid regurgitation signal is inadequate for assessing PA pressure. 3. The mitral valve is normal in structure. Trivial mitral valve regurgitation. No evidence of mitral stenosis. 4. The aortic valve is grossly normal. Unable to determine aortic valve morphology due to image quality. Aortic valve regurgitation is not visualized. No aortic stenosis is present.  Conclusion(s)/Recommendation(s): No evidence of valvular vegetations on this transthoracic echocardiogram. Would recommend a transesophageal echocardiogram to exclude infective endocarditis if clinically indicated.  FINDINGS Left Ventricle: Left ventricular ejection fraction, by estimation, is 60 to 65%. The left ventricle has normal function. The left ventricle has no regional wall motion abnormalities. The left ventricular internal cavity size was normal in size. There is mild left ventricular hypertrophy. Left ventricular diastolic parameters were normal.  Right Ventricle: The right ventricular size is normal. No increase in right ventricular wall thickness. Right ventricular systolic function is normal. Tricuspid regurgitation signal is inadequate for assessing PA pressure.  Left Atrium: Left atrial size was normal in size.  Right Atrium: Right atrial size was normal in size.  Pericardium: There is no evidence of pericardial effusion.  Mitral Valve: The mitral valve is normal in structure. Mild mitral annular calcification. Trivial mitral valve regurgitation. No evidence of mitral valve  stenosis.  Tricuspid Valve: The tricuspid valve is normal in structure. Tricuspid valve regurgitation is trivial. No evidence of tricuspid stenosis.  Aortic Valve: The aortic valve is grossly normal. Aortic valve regurgitation is not visualized. No aortic stenosis is present.  Pulmonic Valve: The pulmonic valve was normal in structure. Pulmonic valve regurgitation is trivial. No evidence of pulmonic stenosis.  Aorta: The aortic root is normal in size and structure.  Venous: The inferior vena cava was not well visualized.  IAS/Shunts: The interatrial septum was not well visualized.   LEFT VENTRICLE PLAX 2D LVIDd:         4.30 cm  Diastology LVIDs:         3.20 cm  LV e' medial:    5.68 cm/s LV PW:         1.10 cm  LV E/e' medial:  15.8 LV IVS:  1.30 cm  LV e' lateral:   7.22 cm/s LVOT diam:     2.10 cm  LV E/e' lateral: 12.4 LV SV:         61 LV SV Index:   26 LVOT Area:     3.46 cm   RIGHT VENTRICLE RV S prime:     13.10 cm/s TAPSE (M-mode): 2.4 cm  LEFT ATRIUM             Index       RIGHT ATRIUM           Index LA diam:        4.40 cm 1.87 cm/m  RA Area:     13.70 cm LA Vol (A2C):   34.5 ml 14.68 ml/m RA Volume:   30.00 ml  12.76 ml/m LA Vol (A4C):   44.0 ml 18.72 ml/m LA Biplane Vol: 39.3 ml 16.72 ml/m AORTIC VALVE LVOT Vmax:   85.50 cm/s LVOT Vmean:  57.400 cm/s LVOT VTI:    0.177 m  AORTA Ao Root diam: 3.00 cm  MITRAL VALVE MV Area (PHT): 4.60 cm    SHUNTS MV Decel Time: 165 msec    Systemic VTI:  0.18 m MV E velocity: 89.50 cm/s  Systemic Diam: 2.10 cm MV A velocity: 77.60 cm/s MV E/A ratio:  1.15  Soyla Merck MD Electronically signed by Soyla Merck MD Signature Date/Time: 09/15/2020/3:50:04 PM    Final          ______________________________________________________________________________________________     Recent Labs: No results found for requested labs within last 365 days.  Recent Lipid Panel    Component Value  Date/Time   CHOL 178 03/16/2012 0958   TRIG 168 (H) 03/16/2012 0958   HDL 34 (L) 03/16/2012 0958   CHOLHDL 5.2 03/16/2012 0958   VLDL 34 03/16/2012 0958   LDLCALC 110 (H) 03/16/2012 0958   LDLDIRECT 102 (H) 04/13/2008 1935    History of Present Illness   68 year old female with the above past medical history including recurrent near syncope, family history of CAD, lower extremity edema, hypertension, hyperlipidemia, type 2 diabetes, peripheral neuropathy, diabetic retinopathy, OSA not on CPAP, hypothyroidism, anxiety, depression, GERD, and rheumatoid arthritis.  She presents today for new patient evaluation in the setting of recurrent near syncope. Lexiscan  in 2013 was negative for ischemia. Echocardiogram in 2021 was overall stable. ABIs in 2021 were normal.  She reports a family history of heart disease in her father, he had multiple heart attacks, and died at the age of 44. She reports a several month history of intermittent episodes of sudden onset weakness with associated nausea and dizziness, near syncope. She denies any loss of consciousness, denies frank syncope. Episodes have occurred randomly, often after she has been up for several hours.  She will feel woozy, her legs give out, and she must lower herself to the ground.  She has not checked her BP in the setting, she has not noted any low blood sugars associated with these episodes. She does note that she has lost approximately 40 pounds. Her PCP recently decreased her losartan  to 50 mg daily. Amlodipine  was decreased to 5 mg daily.   Home Medications    Current Outpatient Medications  Medication Sig Dispense Refill   acetaminophen  (TYLENOL ) 500 MG tablet Take 500-2,000 mg by mouth as needed for moderate pain.      atorvastatin  (LIPITOR) 80 MG tablet Take 80 mg by mouth daily.     B-D ULTRAFINE III SHORT PEN 31G  X 8 MM MISC Inject into the skin as directed.     BD INSULIN  SYRINGE U/F 31G X 5/16 1 ML MISC See admin instructions.  with insulin      Continuous Blood Gluc Sensor (FREESTYLE LIBRE 2 SENSOR) MISC See admin instructions.     cyclobenzaprine (FLEXERIL) 5 MG tablet Take 5 mg by mouth 3 (three) times daily as needed.     DULoxetine  (CYMBALTA ) 30 MG capsule Take 90 mg by mouth daily.     ezetimibe (ZETIA) 10 MG tablet Take 10 mg by mouth daily.     furosemide (LASIX) 20 MG tablet Take 40 mg by mouth daily.      glucose blood (ONETOUCH VERIO) test strip TEST BLOOD SUGAR THREE TIMES DAILY     Insulin  Disposable Pump (OMNIPOD 5 LIBRE2 PLUS G6 PODS) MISC Inject 200 Units into the skin every other day.     losartan  (COZAAR ) 100 MG tablet  (Patient taking differently: Take 50 mg by mouth daily.)     NOVOLOG  100 UNIT/ML injection Inject 200 mLs into the skin 3 (three) times daily with meals.     pantoprazole (PROTONIX) 40 MG tablet Take 40 mg by mouth every morning.     potassium chloride  (KLOR-CON ) 8 MEQ tablet Take 8 mEq by mouth 2 (two) times daily as needed (low potassium).      pregabalin (LYRICA) 50 MG capsule Take 50 mg by mouth 2 (two) times daily.     solifenacin (VESICARE) 10 MG tablet Take 10 mg by mouth daily.     SYNTHROID  75 MCG tablet Take 75 mcg by mouth every morning.     gabapentin (NEURONTIN) 100 MG capsule Take 200 mg by mouth 3 (three) times daily. (Patient not taking: Reported on 07/02/2024)     HUMALOG KWIKPEN 100 UNIT/ML KwikPen Inject 15-25 Units into the skin in the morning, at noon, and at bedtime. (Patient not taking: Reported on 07/02/2024)     LEVEMIR  100 UNIT/ML injection Inject 60 Units into the skin in the morning and at bedtime. (Patient not taking: Reported on 07/02/2024)     meloxicam  (MOBIC ) 15 MG tablet Take 15 mg by mouth daily.     NOVOFINE PEN NEEDLE 32G X 6 MM MISC Inject into the skin daily. (Patient not taking: Reported on 07/02/2024)     traMADol  (ULTRAM ) 50 MG tablet Take 1 tablet (50 mg total) by mouth every 8 (eight) hours. (Patient not taking: Reported on 07/02/2024) 15 tablet 0   No  current facility-administered medications for this visit.     Review of Systems    She denies chest pain, dyspnea, pnd, orthopnea, n, v, dizziness, syncope, edema, weight gain, or early satiety. All other systems reviewed and are otherwise negative except as noted above.   Physical Exam    VS:  BP (!) 174/76   Pulse 74   Ht 5' 7.5 (1.715 m)   Wt 250 lb 9.6 oz (113.7 kg)   SpO2 98%   BMI 38.67 kg/m   GEN: Well nourished, well developed, in no acute distress. HEENT: normal. Neck: Supple, no JVD, carotid bruits, or masses. Cardiac: RRR, no murmurs, rubs, or gallops. No clubbing, cyanosis, edema.  Radials/DP/PT 2+ and equal bilaterally.  Respiratory:  Respirations regular and unlabored, clear to auscultation bilaterally. GI: Soft, nontender, nondistended, BS + x 4. MS: no deformity or atrophy. Skin: warm and dry, no rash. Neuro:  Strength and sensation are intact. Psych: Normal affect.  Accessory Clinical Findings  ECG personally reviewed by me today - EKG Interpretation Date/Time:  Friday July 02 2024 15:16:31 EDT Ventricular Rate:  74 PR Interval:  196 QRS Duration:  96 QT Interval:  434 QTC Calculation: 481 R Axis:   -6  Text Interpretation: Normal sinus rhythm with sinus arrhythmia Incomplete right bundle branch block Minimal voltage criteria for LVH, may be normal variant ( R in aVL ) Cannot rule out Anterior infarct , age undetermined When compared with ECG of 11-Sep-2020 22:42, PREVIOUS ECG IS PRESENT Confirmed by Daneen Perkins ([id number]) on 07/02/2024 3:40:37 PM  - no acute changes.   Lab Results  Component Value Date   WBC 6.0 11/14/2021   HGB 14.3 11/14/2021   HCT 42.1 11/14/2021   MCV 88.4 11/14/2021   PLT 209 11/14/2021   Lab Results  Component Value Date   CREATININE 0.82 11/14/2021   BUN 17 11/14/2021   NA 138 11/14/2021   K 4.3 11/14/2021   CL 101 11/14/2021   CO2 29 11/14/2021   Lab Results  Component Value Date   ALT 22 09/11/2020   AST 37  09/11/2020   ALKPHOS 128 (H) 09/11/2020   BILITOT 0.6 09/11/2020   Lab Results  Component Value Date   CHOL 178 03/16/2012   HDL 34 (L) 03/16/2012   LDLCALC 110 (H) 03/16/2012   LDLDIRECT 102 (H) 04/13/2008   TRIG 168 (H) 03/16/2012   CHOLHDL 5.2 03/16/2012    Lab Results  Component Value Date   HGBA1C 8.6 (H) 09/12/2020    Assessment & Plan    1. Near syncope: She reports a several month history of intermittent episodes of sudden onset weakness with associated nausea and dizziness, near syncope. She denies any loss of consciousness, denies any significant palpitations, though she will occasionally feel her heart racing. Episodes have occurred randomly, often after she has been up for several hours.  She will feel woozy, her legs give out, and she must lower herself to the ground.  She has not checked her BP in the setting, she has not noted any low blood sugars associated with these episodes. Will update echocardiogram. Will check carotid ultrasound, 30-day event monitor to rule out arrhythmogenic source, though strongly suspect near syncope has occurred in the setting of orthostatic hypotension given severe orthostatic hypotension in office today.  Reviewed ED precautions. Will discontinue amlodipine  as below.  2. Hypertension/orthostatic hypotension: She has lost over 40 pounds.  Her PCP recently decreased her losartan  and amlodipine .  Episodes of near syncope as above.  She is severely orthostatic in office today.  Will discontinue amlodipine .  Continue to monitor BP and report SBP consistently greater than 150 mmHg or SBP consistently less than 110 mmHg.  Reviewed ED precautions.  Encouraged gradual position changes, adequate hydration, we discussed abdominal compression, thigh sleeves.   3. Family history of CAD: Stable with no anginal symptoms. No indication for ischemic evaluation.   4. Hyperlipidemia: LDL was 39 in 01/2024.  Continue Lipitor, Zetia.  5. Type 2 diabetes: A1c was  9.4 in 01/2024.  Monitored and managed per PCP.  6. OSA: Not on CPAP.  7. Hypothyroidism: TSH was 1.58 in 01/2024.  Monitor managed per PCP, on levothyroxine .  8. Disposition: Follow-up in 2 to 3 months, her husband is a patient of Dr. Swaziland, she wishes to establish with Dr. Swaziland as well.   Perkins JAYSON Daneen, NP 07/04/2024, 5:38 PM

## 2024-07-04 ENCOUNTER — Encounter: Payer: Self-pay | Admitting: Nurse Practitioner

## 2024-07-05 ENCOUNTER — Encounter: Payer: Self-pay | Admitting: *Deleted

## 2024-07-05 NOTE — Progress Notes (Signed)
 Patient ID: Brittany Silva, female   DOB: [birth year], 68 y.o.   MRN: [id number] Patient enrolled for [employer]/ [employer] to ship a 30 day cardiac event monitor to her address on file. Letter with instructions mailed to patient. Dr. Sw[place]iland to read.

## 2024-07-11 DIAGNOSIS — R55 Syncope and collapse: Secondary | ICD-10-CM | POA: Diagnosis not present

## 2024-07-14 DIAGNOSIS — E1065 Type 1 diabetes mellitus with hyperglycemia: Secondary | ICD-10-CM | POA: Diagnosis not present

## 2024-07-14 DIAGNOSIS — E1140 Type 2 diabetes mellitus with diabetic neuropathy, unspecified: Secondary | ICD-10-CM | POA: Diagnosis not present

## 2024-07-14 DIAGNOSIS — Z4681 Encounter for fitting and adjustment of insulin pump: Secondary | ICD-10-CM | POA: Diagnosis not present

## 2024-07-14 DIAGNOSIS — E7800 Pure hypercholesterolemia, unspecified: Secondary | ICD-10-CM | POA: Diagnosis not present

## 2024-07-14 DIAGNOSIS — Z794 Long term (current) use of insulin: Secondary | ICD-10-CM | POA: Diagnosis not present

## 2024-07-14 DIAGNOSIS — G629 Polyneuropathy, unspecified: Secondary | ICD-10-CM | POA: Diagnosis not present

## 2024-07-14 DIAGNOSIS — I10 Essential (primary) hypertension: Secondary | ICD-10-CM | POA: Diagnosis not present

## 2024-07-14 DIAGNOSIS — E039 Hypothyroidism, unspecified: Secondary | ICD-10-CM | POA: Diagnosis not present

## 2024-07-20 DIAGNOSIS — Z961 Presence of intraocular lens: Secondary | ICD-10-CM | POA: Diagnosis not present

## 2024-07-20 DIAGNOSIS — H43813 Vitreous degeneration, bilateral: Secondary | ICD-10-CM | POA: Diagnosis not present

## 2024-07-20 DIAGNOSIS — E113591 Type 2 diabetes mellitus with proliferative diabetic retinopathy without macular edema, right eye: Secondary | ICD-10-CM | POA: Diagnosis not present

## 2024-07-20 DIAGNOSIS — H35033 Hypertensive retinopathy, bilateral: Secondary | ICD-10-CM | POA: Diagnosis not present

## 2024-07-20 DIAGNOSIS — E113412 Type 2 diabetes mellitus with severe nonproliferative diabetic retinopathy with macular edema, left eye: Secondary | ICD-10-CM | POA: Diagnosis not present

## 2024-07-20 DIAGNOSIS — H35373 Puckering of macula, bilateral: Secondary | ICD-10-CM | POA: Diagnosis not present

## 2024-08-04 ENCOUNTER — Ambulatory Visit (HOSPITAL_COMMUNITY)
Admission: RE | Admit: 2024-08-04 | Discharge: 2024-08-04 | Disposition: A | Discharge status: Home / Self Care | Source: Ambulatory Visit | Attending: Internal Medicine | Admitting: Internal Medicine

## 2024-08-04 DIAGNOSIS — R55 Syncope and collapse: Secondary | ICD-10-CM | POA: Diagnosis not present

## 2024-08-04 LAB — ECHOCARDIOGRAM COMPLETE
Area-P 1/2: 4.21 cm2
S' Lateral: 3.2 cm

## 2024-08-09 ENCOUNTER — Ambulatory Visit: Payer: Self-pay | Admitting: Nurse Practitioner

## 2024-08-11 DIAGNOSIS — I10 Essential (primary) hypertension: Secondary | ICD-10-CM | POA: Diagnosis not present

## 2024-08-11 DIAGNOSIS — E7800 Pure hypercholesterolemia, unspecified: Secondary | ICD-10-CM | POA: Diagnosis not present

## 2024-08-11 DIAGNOSIS — E1140 Type 2 diabetes mellitus with diabetic neuropathy, unspecified: Secondary | ICD-10-CM | POA: Diagnosis not present

## 2024-08-11 DIAGNOSIS — Z4681 Encounter for fitting and adjustment of insulin pump: Secondary | ICD-10-CM | POA: Diagnosis not present

## 2024-08-11 DIAGNOSIS — E1065 Type 1 diabetes mellitus with hyperglycemia: Secondary | ICD-10-CM | POA: Diagnosis not present

## 2024-08-11 DIAGNOSIS — Z794 Long term (current) use of insulin: Secondary | ICD-10-CM | POA: Diagnosis not present

## 2024-08-11 DIAGNOSIS — L97509 Non-pressure chronic ulcer of other part of unspecified foot with unspecified severity: Secondary | ICD-10-CM | POA: Diagnosis not present

## 2024-08-11 DIAGNOSIS — E039 Hypothyroidism, unspecified: Secondary | ICD-10-CM | POA: Diagnosis not present

## 2024-08-13 ENCOUNTER — Ambulatory Visit: Attending: Nurse Practitioner

## 2024-08-13 DIAGNOSIS — R55 Syncope and collapse: Secondary | ICD-10-CM

## 2024-08-14 DIAGNOSIS — R55 Syncope and collapse: Secondary | ICD-10-CM | POA: Diagnosis not present

## 2024-08-27 ENCOUNTER — Other Ambulatory Visit (HOSPITAL_COMMUNITY): Payer: Self-pay

## 2024-08-27 ENCOUNTER — Other Ambulatory Visit: Payer: Self-pay

## 2024-09-09 NOTE — Progress Notes (Unsigned)
 Cardiology Office Note:    Date:  09/09/2024   ID:  Brittany Silva 1956-07-02, MRN [id number]  PCP:  Claudene Pellet, MD   [facility] Providers Cardiologist:  Sherard Sutch [place], MD { Click to update primary MD,subspecialty MD or APP then REFRESH:1}    Referring MD: Claudene Pellet, MD   No chief complaint on file. ***  History of Present Illness:    Brittany Silva is a 68 y.o. female is seen for follow up near syncope. Initially evaluated by Brittany Braver NP on 07/02/24. She has a past medical history including recurrent near syncope, family history of CAD, lower extremity edema, hypertension, hyperlipidemia, type 2 diabetes, peripheral neuropathy, diabetic retinopathy, OSA not on CPAP, hypothyroidism, anxiety, depression, GERD, and rheumatoid arthritis. Lexiscan  in 2013 was negative for ischemia. Echocardiogram in 2021 was overall stable. ABIs in 2021 were normal.  She reports a family history of heart disease in her father, he had multiple heart attacks, and died at the age of 76. She reports a several month history of intermittent episodes of sudden onset weakness with associated nausea and dizziness, near syncope. She denies any loss of consciousness, denies frank syncope. Episodes have occurred randomly, often after she has been up for several hours.  She will feel woozy, her legs give out, and she must lower herself to the ground. Her BP medications were reduced by PCP. When seen by Brittany she was significantly orthostatic and amlodipine  was discontinued. Instructed on conservative measures to manage orthostasis. Event monitor was benign. Echo showed moderate LVH with normal EF. No significant valvular abnormality.  Past Medical History:  Diagnosis Date   Ankle fracture    left   Anxiety    chronic   Cervical back pain with evidence of disc disease    Diabetes mellitus    Dyslipidemia    Edema    lower extremity   Facet arthropathy, lumbosacral 08/21/2021   GERD  (gastroesophageal reflux disease)    Hyperlipidemia    Hypertension    Hypothyroid    Obstructive apnea    Split study 05-22-12 - patient was too claustrophobic and did not persue CPAP.    Osteoarthritis    PVC's (premature ventricular contractions)    Retinopathy    diabetic, mild, nonproliferative bilateral   TSH (thyroid -stimulating hormone deficiency)    evaluation off RX in 4/14   Vitamin D  deficiency     Past Surgical History:  Procedure Laterality Date   ABDOMINAL HYSTERECTOMY     AMPUTATION Left 09/21/2020   Procedure: AMPUTATION LEFT 4TH TOE;  Surgeon: Tobie Franky SQUIBB, DPM;  Location: [facility] OR;  Service: Podiatry;  Laterality: Left;   APPLICATION OF A-CELL OF EXTREMITY Left 09/21/2020   Procedure: APPLICATION OF A-CELL, left foot;  Surgeon: Tobie Franky SQUIBB, DPM;  Location: [facility] OR;  Service: Podiatry;  Laterality: Left;   CATARACT EXTRACTION Bilateral 2012   EYE SURGERY Right 06/26/11   cataract removal Rt eye   FOOT SURGERY     Ankle fracture   index finger surgery  2007   spider bite   IR RADIOLOGIST EVAL & MGMT  09/14/2020   ORIF ANKLE FRACTURE Left [id number]   Right knee surgery  1966   torn cartilage   TONSILLECTOMY  1962   adenoidectomy   TUBAL LIGATION Bilateral 1986    Current Medications: No outpatient medications have been marked as taking for the 09/10/24 encounter (Appointment) with [place], Edom Schmuhl M, MD.     Allergies:   Hydrochlorothiazide,  Lisinopril, Other, Adhesive [tape], Codeine phosphate, Darvon, Darvon [propoxyphene hcl], Hydrocodone-acetaminophen , Metformin hcl, Polymyxin b, Propoxyphene hcl, Latex, Neosporin [neomycin-bacitracin zn-polymyx], and Sulfamethoxazole   Social History   Socioeconomic History   Marital status: Married    Spouse name: Not on file   Number of children: 2   Years of education: 12   Highest education level: Not on file  Occupational History    Employer: UNEMPLOYED    Comment: [employer]  Tobacco Use   Smoking  status: Never   Smokeless tobacco: Never  Vaping Use   Vaping status: Never Used  Substance and Sexual Activity   Alcohol use: No   Drug use: No   Sexual activity: Not on file  Other Topics Concern   Not on file  Social History Narrative   Not on file   Social Drivers of Health   Financial Resource Strain: Not on file  Food Insecurity: Not on file  Transportation Needs: Not on file  Physical Activity: Not on file  Stress: Not on file  Social Connections: Not on file     Family History: The patient's ***family history includes Allergies in her sister; Coronary artery disease in her father; Diabetes in her maternal grandfather; Heart attack in her paternal grandfather; Hypertension in her mother; Psoriasis in her sister; Thyroid  disease in her mother; Ulcers in her sister. There is no history of Colon cancer.  ROS:   Please see the history of present illness.    *** All other systems reviewed and are negative.  EKGs/Labs/Other Studies Reviewed:    The following studies were reviewed today: Event monitor 08/13/24: Study Highlights      Normal sinus rhythm   Occasional PACs   Rare PVCs   No Afib seen  Echo 08/04/24: IMPRESSIONS     1. Left ventricular ejection fraction, by estimation, is 65 to 70%. The  left ventricle has normal function. The left ventricle has no regional  wall motion abnormalities. There is moderate left ventricular hypertrophy.  Left ventricular diastolic  parameters are consistent with Grade I diastolic dysfunction (impaired  relaxation).   2. Right ventricular systolic function is normal. The right ventricular  size is normal. Tricuspid regurgitation signal is inadequate for assessing  PA pressure.   3. The mitral valve is grossly normal. Trivial mitral valve  regurgitation. No evidence of mitral stenosis.   4. The aortic valve is grossly normal. Aortic valve regurgitation is not  visualized. No aortic stenosis is present.   5. The inferior vena  cava is normal in size with greater than 50%  respiratory variability, suggesting right atrial pressure of 3 mmHg.        Recent Labs: No results found for requested labs within last 365 days.  Recent Lipid Panel    Component Value Date/Time   CHOL 178 03/16/2012 0958   TRIG 168 (H) 03/16/2012 0958   HDL 34 (L) 03/16/2012 0958   CHOLHDL 5.2 03/16/2012 0958   VLDL 34 03/16/2012 0958   LDLCALC 110 (H) 03/16/2012 0958   LDLDIRECT 102 (H) 04/13/2008 1935     Risk Assessment/Calculations:   {Does this patient have ATRIAL FIBRILLATION?:[id number]}  No BP recorded.  {Refresh Note OR Click here to enter BP  :1}***         Physical Exam:    VS:  There were no vitals taken for this visit.    Wt Readings from Last 3 Encounters:  07/02/24 250 lb 9.6 oz (113.7 kg)  11/14/21 278 lb (126.1 kg)  08/21/21 281 lb (127.5 kg)     GEN: *** Well nourished, well developed in no acute distress HEENT: Normal NECK: No JVD; No carotid bruits LYMPHATICS: No lymphadenopathy CARDIAC: ***RRR, no murmurs, rubs, gallops RESPIRATORY:  Clear to auscultation without rales, wheezing or rhonchi  ABDOMEN: Soft, non-tender, non-distended MUSCULOSKELETAL:  No edema; No deformity  SKIN: Warm and dry NEUROLOGIC:  Alert and oriented x 3 PSYCHIATRIC:  Normal affect   ASSESSMENT:    No diagnosis found. PLAN:    In order of problems listed above:  ***      {Are you ordering a CV Procedure (e.g. stress test, cath, DCCV, TEE, etc)?   Press F2        :[id number]}    Medication Adjustments/Labs and Tests Ordered: Current medicines are reviewed at length with the patient today.  Concerns regarding medicines are outlined above.  No orders of the defined types were placed in this encounter.  No orders of the defined types were placed in this encounter.   There are no Patient Instructions on file for this visit.   Signed, Konnie Noffsinger [place], MD  09/09/2024 8:10 AM    [facility]

## 2024-09-10 ENCOUNTER — Encounter: Payer: Self-pay | Admitting: Cardiology

## 2024-09-10 ENCOUNTER — Ambulatory Visit: Attending: Cardiology | Admitting: Cardiology

## 2024-09-10 VITALS — BP 118/66 | HR 90 | Ht 67.5 in | Wt 249.0 lb

## 2024-09-10 DIAGNOSIS — R55 Syncope and collapse: Secondary | ICD-10-CM

## 2024-09-10 DIAGNOSIS — R9431 Abnormal electrocardiogram [ECG] [EKG]: Secondary | ICD-10-CM | POA: Diagnosis not present

## 2024-09-10 DIAGNOSIS — I517 Cardiomegaly: Secondary | ICD-10-CM | POA: Diagnosis not present

## 2024-09-10 DIAGNOSIS — I951 Orthostatic hypotension: Secondary | ICD-10-CM

## 2024-09-10 DIAGNOSIS — E1043 Type 1 diabetes mellitus with diabetic autonomic (poly)neuropathy: Secondary | ICD-10-CM | POA: Diagnosis not present

## 2024-09-10 MED ORDER — LOSARTAN POTASSIUM 25 MG PO TABS: 25.0000 mg | ORAL_TABLET | Freq: Every day | ORAL | 3 refills | Status: AC

## 2024-09-10 NOTE — Patient Instructions (Addendum)
 Medication Instructions:  Decrease Losartan  to 25 mg daily Decrease Lasix to 40 mg daily only if needed Take Klor Con daily just on day you take Lasix Continue all other medications *If you need a refill on your cardiac medications before your next appointment, please call your pharmacy*  Lab Work: Multiple myeloma panel and Kappa light chains today   Testing/Procedures: PYP Scan   Follow-Up: At [facility], you and your health needs are our priority.  As part of our continuing mission to provide you with exceptional heart care, our providers are all part of one team.  This team includes your primary Cardiologist (physician) and Advanced Practice Providers or APPs (Physician Assistants and Nurse Practitioners) who all work together to provide you with the care you need, when you need it.  Your next appointment:  3 months     Provider:  Dr.Jordan      Wear Thigh High Compression stockings moderate compression during day Take off at bedtime   We recommend signing up for the patient portal called MyChart.  Sign up information is provided on this After Visit Summary.  MyChart is used to connect with patients for Virtual Visits (Telemedicine).  Patients are able to view lab/test results, encounter notes, upcoming appointments, etc.  Non-urgent messages can be sent to your provider as well.   To learn more about what you can do with MyChart, go to [email or web address].

## 2024-09-13 ENCOUNTER — Telehealth (HOSPITAL_COMMUNITY): Payer: Self-pay | Admitting: *Deleted

## 2024-09-13 LAB — KAPPA/LAMBDA LIGHT CHAINS
Ig Kappa Free Light Chain: 33.4 mg/L — ABNORMAL HIGH (ref 3.3–19.4)
Ig Lambda Free Light Chain: 30.2 mg/L — ABNORMAL HIGH (ref 5.7–26.3)
KAPPA/LAMBDA RATIO: 1.11 (ref 0.26–1.65)

## 2024-09-13 NOTE — Telephone Encounter (Signed)
 Left message reminding patient of upcoming Amyloid Appointment.  Brittany Silva

## 2024-09-14 LAB — IMMUNOFIXATION, URINE

## 2024-09-14 LAB — MULTIPLE MYELOMA PANEL, SERUM
Albumin SerPl Elph-Mcnc: 3.4 g/dL (ref 2.9–4.4)
Albumin/Glob SerPl: 1.1 (ref 0.7–1.7)
Alpha 1: 0.3 g/dL (ref 0.0–0.4)
Alpha2 Glob SerPl Elph-Mcnc: 0.9 g/dL (ref 0.4–1.0)
B-Globulin SerPl Elph-Mcnc: 1.2 g/dL (ref 0.7–1.3)
Gamma Glob SerPl Elph-Mcnc: 0.9 g/dL (ref 0.4–1.8)
Globulin, Total: 3.3 g/dL (ref 2.2–3.9)
IgA/Immunoglobulin A, Serum: 389 mg/dL — AB (ref 87–352)
IgG (Immunoglobin G), Serum: 1094 mg/dL (ref 586–1602)
IgM (Immunoglobulin M), Srm: 67 mg/dL (ref 26–217)
Total Protein: 6.7 g/dL (ref 6.0–8.5)

## 2024-09-15 ENCOUNTER — Ambulatory Visit: Payer: Self-pay | Admitting: Cardiology

## 2024-09-16 ENCOUNTER — Other Ambulatory Visit: Payer: Self-pay | Admitting: Cardiology

## 2024-09-16 DIAGNOSIS — I517 Cardiomegaly: Secondary | ICD-10-CM

## 2024-09-16 DIAGNOSIS — R55 Syncope and collapse: Secondary | ICD-10-CM

## 2024-09-16 DIAGNOSIS — R9431 Abnormal electrocardiogram [ECG] [EKG]: Secondary | ICD-10-CM

## 2024-09-20 ENCOUNTER — Ambulatory Visit (HOSPITAL_COMMUNITY)
Admission: RE | Admit: 2024-09-20 | Discharge: 2024-09-20 | Disposition: A | Discharge status: Home / Self Care | Source: Ambulatory Visit | Attending: Cardiology | Admitting: Cardiology

## 2024-09-20 DIAGNOSIS — I517 Cardiomegaly: Secondary | ICD-10-CM | POA: Insufficient documentation

## 2024-09-20 DIAGNOSIS — R9431 Abnormal electrocardiogram [ECG] [EKG]: Secondary | ICD-10-CM | POA: Diagnosis not present

## 2024-09-20 DIAGNOSIS — R55 Syncope and collapse: Secondary | ICD-10-CM | POA: Insufficient documentation

## 2024-09-20 LAB — MYOCARDIAL AMYLOID PLANAR & SPECT: H/CL Ratio: 1.03

## 2024-09-20 MED ORDER — TECHNETIUM TC 99M PYROPHOSPHATE
21.2000 | Freq: Once | INTRAVENOUS | Status: AC
  Administered 2024-09-20: 21.2 via INTRAVENOUS

## 2024-10-05 DIAGNOSIS — Z1231 Encounter for screening mammogram for malignant neoplasm of breast: Secondary | ICD-10-CM | POA: Diagnosis not present

## 2024-10-26 DIAGNOSIS — Z961 Presence of intraocular lens: Secondary | ICD-10-CM | POA: Diagnosis not present

## 2024-10-26 DIAGNOSIS — H35033 Hypertensive retinopathy, bilateral: Secondary | ICD-10-CM | POA: Diagnosis not present

## 2024-10-26 DIAGNOSIS — H43813 Vitreous degeneration, bilateral: Secondary | ICD-10-CM | POA: Diagnosis not present

## 2024-10-26 DIAGNOSIS — E113412 Type 2 diabetes mellitus with severe nonproliferative diabetic retinopathy with macular edema, left eye: Secondary | ICD-10-CM | POA: Diagnosis not present

## 2024-10-26 DIAGNOSIS — H35373 Puckering of macula, bilateral: Secondary | ICD-10-CM | POA: Diagnosis not present

## 2024-10-26 DIAGNOSIS — E113591 Type 2 diabetes mellitus with proliferative diabetic retinopathy without macular edema, right eye: Secondary | ICD-10-CM | POA: Diagnosis not present

## 2024-12-08 NOTE — Progress Notes (Signed)
 DRUCELLA KARBOWSKI                                          MRN: [id number]   12/08/2024   The VBCI Quality Team Specialist reviewed this patient medical record for the purposes of chart review for care gap closure. The following were reviewed: abstraction for care gap closure-kidney health evaluation for diabetes:eGFR  and uACR.    VBCI Quality Team

## 2024-12-10 NOTE — Progress Notes (Unsigned)
 " Cardiology Office Note:    Date:  12/10/2024   ID:  Brittany Silva, Brittany Silva 01-19-1956, MRN [id number]  PCP:  Claudene Pellet, MD   [facility] Providers Cardiologist:  Kiyani Jernigan, MD     Referring MD: Claudene Pellet, MD   No chief complaint on file.   History of Present Illness:    Brittany Silva is a 69 y.o. female is seen for follow up near syncope. Initially evaluated by Damien Braver NP on 07/02/24. She has a past medical history including recurrent near syncope, family history of CAD, lower extremity edema, hypertension, hyperlipidemia, type 2 diabetes, peripheral neuropathy, diabetic retinopathy, OSA not on CPAP, hypothyroidism, anxiety, depression, GERD, and rheumatoid arthritis. Lexiscan  in 2013 was negative for ischemia. Echocardiogram in 2021 was overall stable. ABIs in 2021 were normal.  She reports a family history of heart disease in her father, he had multiple heart attacks, and died at the age of 56. She reports a several month history of intermittent episodes of sudden onset weakness with associated nausea and dizziness, near syncope. She denies any loss of consciousness, denies frank syncope. Episodes have occurred randomly, often after she has been up for several hours.  She will feel woozy, her legs give out, and she must lower herself to the ground. Her BP medications were reduced by PCP. When seen by Damien she was significantly orthostatic and amlodipine  was discontinued. Losartan  was reduced. Instructed on conservative measures to manage orthostasis. Event monitor was benign. Echo showed moderate LVH with normal EF. No significant valvular abnormality.  Since her last visit her symptoms have improved but have not resolved still may have 2-3 episodes a day with standing. Home BP confirms low BP down to 84/66.   PYP scan was negative for amyloid. SPEP normal. She was noted to have mild coronary calcification.   Past Medical History:  Diagnosis Date   Ankle fracture     left   Anxiety    chronic   Cervical back pain with evidence of disc disease    Diabetes mellitus    Dyslipidemia    Edema    lower extremity   Facet arthropathy, lumbosacral 08/21/2021   GERD (gastroesophageal reflux disease)    Hyperlipidemia    Hypertension    Hypothyroid    Obstructive apnea    Split study 05-22-12 - patient was too claustrophobic and did not persue CPAP.    Osteoarthritis    PVC's (premature ventricular contractions)    Retinopathy    diabetic, mild, nonproliferative bilateral   TSH (thyroid -stimulating hormone deficiency)    evaluation off RX in 4/14   Vitamin D  deficiency     Past Surgical History:  Procedure Laterality Date   ABDOMINAL HYSTERECTOMY     AMPUTATION Left 09/21/2020   Procedure: AMPUTATION LEFT 4TH TOE;  Surgeon: Tobie Franky SQUIBB, DPM;  Location: [facility] OR;  Service: Podiatry;  Laterality: Left;   APPLICATION OF A-CELL OF EXTREMITY Left 09/21/2020   Procedure: APPLICATION OF A-CELL, left foot;  Surgeon: Tobie Franky SQUIBB, DPM;  Location: [facility] OR;  Service: Podiatry;  Laterality: Left;   CATARACT EXTRACTION Bilateral 2012   EYE SURGERY Right 06/26/11   cataract removal Rt eye   FOOT SURGERY     Ankle fracture   index finger surgery  2007   spider bite   IR RADIOLOGIST EVAL & MGMT  09/14/2020   ORIF ANKLE FRACTURE Left 2006,2008   Right knee surgery  1966   torn cartilage  TONSILLECTOMY  1962   adenoidectomy   TUBAL LIGATION Bilateral 1986    Current Medications: No outpatient medications have been marked as taking for the 12/13/24 encounter (Appointment) with Renesmee Raine M, MD.     Allergies:   Hydrochlorothiazide, Lisinopril, Other, Adhesive [tape], Codeine phosphate, Darvon, Darvon [propoxyphene hcl], Hydrocodone-acetaminophen , Metformin hcl, Polymyxin b, Propoxyphene hcl, Latex, Neosporin [neomycin-bacitracin zn-polymyx], and Sulfamethoxazole   Social History   Socioeconomic History   Marital status: Married    Spouse name: Not  on file   Number of children: 2   Years of education: 12   Highest education level: Not on file  Occupational History    Employer: UNEMPLOYED    Comment: [employer]  Tobacco Use   Smoking status: Never   Smokeless tobacco: Never  Vaping Use   Vaping status: Never Used  Substance and Sexual Activity   Alcohol use: No   Drug use: No   Sexual activity: Not on file  Other Topics Concern   Not on file  Social History Narrative   Not on file   Social Drivers of Health   Tobacco Use: Low Risk (09/10/2024)   Patient History    Smoking Tobacco Use: Never    Smokeless Tobacco Use: Never    Passive Exposure: Not on file  Financial Resource Strain: Not on file  Food Insecurity: Not on file  Transportation Needs: Not on file  Physical Activity: Not on file  Stress: Not on file  Social Connections: Not on file  Depression (EYV7-0): Not on file  Alcohol Screen: Not on file  Housing: Not on file  Utilities: Not on file  Health Literacy: Not on file     Family History: The patient's family history includes Allergies in her sister; Coronary artery disease in her father; Diabetes in her maternal grandfather; Heart attack in her paternal grandfather; Hypertension in her mother; Psoriasis in her sister; Thyroid  disease in her mother; Ulcers in her sister. There is no history of Colon cancer.  ROS:   Please see the history of present illness.     All other systems reviewed and are negative.  EKGs/Labs/Other Studies Reviewed:    The following studies were reviewed today: Event monitor 08/13/24: Study Highlights      Normal sinus rhythm   Occasional PACs   Rare PVCs   No Afib seen  Echo 08/04/24: IMPRESSIONS     1. Left ventricular ejection fraction, by estimation, is 65 to 70%. The  left ventricle has normal function. The left ventricle has no regional  wall motion abnormalities. There is moderate left ventricular hypertrophy.  Left ventricular diastolic  parameters are  consistent with Grade I diastolic dysfunction (impaired  relaxation).   2. Right ventricular systolic function is normal. The right ventricular  size is normal. Tricuspid regurgitation signal is inadequate for assessing  PA pressure.   3. The mitral valve is grossly normal. Trivial mitral valve  regurgitation. No evidence of mitral stenosis.   4. The aortic valve is grossly normal. Aortic valve regurgitation is not  visualized. No aortic stenosis is present.   5. The inferior vena cava is normal in size with greater than 50%  respiratory variability, suggesting right atrial pressure of 3 mmHg.       PYP scan 09/20/24: Study Highlights Show Result Comparison    Findings are not suggestive of cardiac ATTR amyloidosis. The myocardium was negative for radiotracer uptake.   The visual grade of myocardial uptake relative to the ribs was Grade 0 (  No myocardial uptake and normal bone uptake).   Coronary calcium  was present on the attenuation correction CT images. Mild coronary calcifications were present. Coronary calcifications were present in the left anterior descending artery distribution(s).  Recent Labs: No results found for requested labs within last 365 days.  Recent Lipid Panel    Component Value Date/Time   CHOL 178 03/16/2012 0958   TRIG 168 (H) 03/16/2012 0958   HDL 34 (L) 03/16/2012 0958   CHOLHDL 5.2 03/16/2012 0958   VLDL 34 03/16/2012 0958   LDLCALC 110 (H) 03/16/2012 0958   LDLDIRECT 102 (H) 04/13/2008 1935     Risk Assessment/Calculations:      No BP recorded.  {Refresh Note OR Click here to enter BP  :1}***         Physical Exam:    VS:  There were no vitals taken for this visit.    Wt Readings from Last 3 Encounters:  09/10/24 249 lb (112.9 kg)  07/02/24 250 lb 9.6 oz (113.7 kg)  11/14/21 278 lb (126.1 kg)     GEN:  Well nourished, well developed in no acute distress HEENT: Normal NECK: No JVD; No carotid bruits LYMPHATICS: No  lymphadenopathy CARDIAC: RRR, no murmurs, rubs, gallops RESPIRATORY:  Clear to auscultation without rales, wheezing or rhonchi  ABDOMEN: Soft, non-tender, non-distended MUSCULOSKELETAL:  No edema; No deformity  SKIN: Warm and dry NEUROLOGIC:  Alert and oriented x 3 PSYCHIATRIC:  Normal affect   ASSESSMENT:    No diagnosis found.  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 This is most likely related to longstanding DM on insulin . Need to consider possible amyloid. Will check PYP scan and check SPEP.  Recommend reducing losartan  to 25 mg daily. Also recommend she only take lasix PRN and not daily. Thigh high moderate compression hose. Maintain good hydration. Will follow up in 3 months. If symptoms persist would stop losartan  and consider midodrine.       Informed Consent   Shared Decision Making/Informed Consent The risks (exposure to a small amount of radiation, allergic or hypersensitivity reaction), benefits (assessment for cardiac amyloidosis) and alternatives of cardiac scintigraphy were discussed in detail with Ms. Crutcher and she agrees to proceed.       Medication Adjustments/Labs and Tests Ordered: Current medicines are reviewed at length with the patient today.  Concerns regarding medicines are outlined above.  No orders of the defined types were placed in this encounter.  No orders of the defined types were placed in this encounter.   There are no Patient Instructions on file for this visit.   Signed, Rodolph Hagemann, MD  12/10/2024 8:12 AM    [facility]  "

## 2024-12-13 ENCOUNTER — Ambulatory Visit: Attending: Cardiology | Admitting: Cardiology

## 2024-12-13 ENCOUNTER — Encounter: Payer: Self-pay | Admitting: Cardiology

## 2024-12-13 VITALS — BP 140/90 | HR 92 | Ht 67.5 in | Wt 243.8 lb

## 2024-12-13 DIAGNOSIS — I25118 Atherosclerotic heart disease of native coronary artery with other forms of angina pectoris: Secondary | ICD-10-CM

## 2024-12-13 DIAGNOSIS — E1043 Type 1 diabetes mellitus with diabetic autonomic (poly)neuropathy: Secondary | ICD-10-CM | POA: Diagnosis not present

## 2024-12-13 DIAGNOSIS — R9431 Abnormal electrocardiogram [ECG] [EKG]: Secondary | ICD-10-CM | POA: Diagnosis not present

## 2024-12-13 DIAGNOSIS — R55 Syncope and collapse: Secondary | ICD-10-CM

## 2024-12-13 DIAGNOSIS — I951 Orthostatic hypotension: Secondary | ICD-10-CM

## 2024-12-13 NOTE — Patient Instructions (Addendum)
 Medication Instructions:  Start Aspirin 81 mg daily Continue all other medications *If you need a refill on your cardiac medications before your next appointment, please call your pharmacy*  Lab Work: None ordered  Testing/Procedures: Cardiac PET CT will be scheduled at [facility] after approved by insurance    Follow instructions below  Follow-Up: At [facility], you and your health needs are our priority.  As part of our continuing mission to provide you with exceptional heart care, our providers are all part of one team.  This team includes your primary Cardiologist (physician) and Advanced Practice Providers or APPs (Physician Assistants and Nurse Practitioners) who all work together to provide you with the care you need, when you need it.  Your next appointment:  2 months    Monday 3/23 at 1:20 pm    Provider:  Dr.Jordan      Please report to Radiology at the [facility] Main Entrance 30 minutes early for your test.  [address] [place], [place] [address]                         OR   Please report to Radiology at [facility] Main Entrance, medical mall, 30 mins prior to your test.  [address]  [place], [place]  How to Prepare for Your Cardiac PET/CT Stress Test:  Nothing to eat or drink, except water, 3 hours prior to arrival time.  NO caffeine/decaffeinated products, or chocolate 12 hours prior to arrival. (Please note decaffeinated beverages (teas/coffees) still contain caffeine).  If you have caffeine within 12 hours prior, the test will need to be rescheduled.  Medication instructions: Do not take nitrates (isosorbide mononitrate, Ranexa) the day before or day of test Do not take tamsulosin the day before or morning of test Hold theophylline containing medications for 12 hours. Hold Dipyridamole 48 hours prior to the test. Hold Furosemide morning of test  Diabetic Preparation: If able to eat  breakfast prior to 3 hour fasting, you may take all medications, including your insulin . Do not worry if you miss your breakfast dose of insulin  - start at your next meal. If you do not eat prior to 3 hour fast-Hold all diabetes (oral and insulin ) medications. Patients who wear a continuous glucose monitor MUST remove the device prior to scanning.  You may take your remaining medications with water.  NO perfume, cologne or lotion on chest or abdomen area. FEMALES - Please avoid wearing dresses to this appointment.  Total time is 1 to 2 hours; you may want to bring reading material for the waiting time.  IF YOU THINK YOU MAY BE PREGNANT, OR ARE NURSING PLEASE INFORM THE TECHNOLOGIST.  In preparation for your appointment, medication and supplies will be purchased.  Appointment availability is limited, so if you need to cancel or reschedule, please call the Radiology Department Scheduler at [phone] 24 hours in advance to avoid a cancellation fee of $100.00  What to Expect When you Arrive:  Once you arrive and check in for your appointment, you will be taken to a preparation room within the Radiology Department.  A technologist or Nurse will obtain your medical history, verify that you are correctly prepped for the exam, and explain the procedure.  Afterwards, an IV will be started in your arm and electrodes will be placed on your skin for EKG monitoring during the stress portion of the exam. Then you will be  escorted to the PET/CT scanner.  There, staff will get you positioned on the scanner and obtain a blood pressure and EKG.  During the exam, you will continue to be connected to the EKG and blood pressure machines.  A small, safe amount of a radioactive tracer will be injected in your IV to obtain a series of pictures of your heart along with an injection of a stress agent.    After your Exam:  It is recommended that you eat a meal and drink a caffeinated beverage to counter act any  effects of the stress agent.  Drink plenty of fluids for the remainder of the day and urinate frequently for the first couple of hours after the exam.  Your doctor will inform you of your test results within 7-10 business days.  For more information and frequently asked questions, please visit our website: [email or web address]  For questions about your test or how to prepare for your test, please call: Cardiac Imaging Nurse Navigators Office: [phone]   We recommend signing up for the patient portal called MyChart.  Sign up information is provided on this After Visit Summary.  MyChart is used to connect with patients for Virtual Visits (Telemedicine).  Patients are able to view lab/test results, encounter notes, upcoming appointments, etc.  Non-urgent messages can be sent to your provider as well.   To learn more about what you can do with MyChart, go to [email or web address].

## 2025-01-06 ENCOUNTER — Telehealth: Payer: Self-pay

## 2025-01-06 NOTE — Telephone Encounter (Signed)
 Attempted to reach patient concerning colonoscopy recall; unable to speak with patient;  left message and number to the office for patient to call back and schedule appts;

## 2025-01-26 ENCOUNTER — Other Ambulatory Visit (HOSPITAL_COMMUNITY)

## 2025-01-31 ENCOUNTER — Ambulatory Visit: Admitting: Physician Assistant

## 2025-02-21 ENCOUNTER — Ambulatory Visit: Admitting: Cardiology
